# Patient Record
Sex: Male | Born: 1965
Health system: Southern US, Community
[De-identification: ages and names within clinical notes are randomized; demographics above are authoritative.]

## PROBLEM LIST (undated history)

## (undated) DIAGNOSIS — I10 Essential (primary) hypertension: Secondary | ICD-10-CM

## (undated) DIAGNOSIS — K8020 Calculus of gallbladder without cholecystitis without obstruction: Secondary | ICD-10-CM

## (undated) DIAGNOSIS — M1990 Unspecified osteoarthritis, unspecified site: Secondary | ICD-10-CM

## (undated) DIAGNOSIS — R51 Headache: Secondary | ICD-10-CM

## (undated) HISTORY — DX: Headache: R51

## (undated) HISTORY — PX: TOE FUSION: SHX1070

## (undated) HISTORY — PX: OTHER SURGICAL HISTORY: SHX169

## (undated) HISTORY — PX: TRIGGER FINGER RELEASE: SHX641

## (undated) HISTORY — PX: ROTATOR CUFF REPAIR: SHX139

---

## 2011-08-29 ENCOUNTER — Ambulatory Visit
Admission: RE | Admit: 2011-08-29 | Discharge: 2011-08-29 | Disposition: A | Discharge status: Home / Self Care | Payer: BC Managed Care – PPO | Source: Ambulatory Visit | Attending: Family Medicine | Admitting: Family Medicine

## 2011-08-29 ENCOUNTER — Other Ambulatory Visit: Payer: Self-pay | Admitting: Family Medicine

## 2011-08-29 DIAGNOSIS — R609 Edema, unspecified: Secondary | ICD-10-CM

## 2011-08-29 DIAGNOSIS — R52 Pain, unspecified: Secondary | ICD-10-CM

## 2011-08-29 IMAGING — US US EXTREM LOW VENOUS*R*
1 series · 14 of 24 positions shown · non-contrast
Comparison: None

CLINICAL DATA: right knee pain and swelling//question dvtt;;

RIGHT LOWER EXTREMITY VENOUS DUPLEX ULTRASOUND
TECHNIQUE: Gray-scale sonography with compression, as well as color
and duplex ultrasound, were performed to evaluate the deep venous
system from the level of the common femoral vein through the
popliteal and proximal calf veins.

[Series 1: us extrem low venous*right* · 14 of 34 slices shown]
[im 1/34]
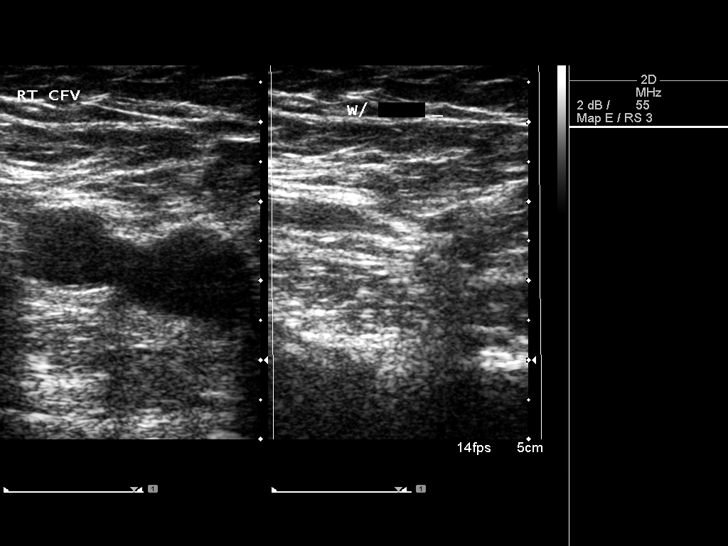
[im 3/34]
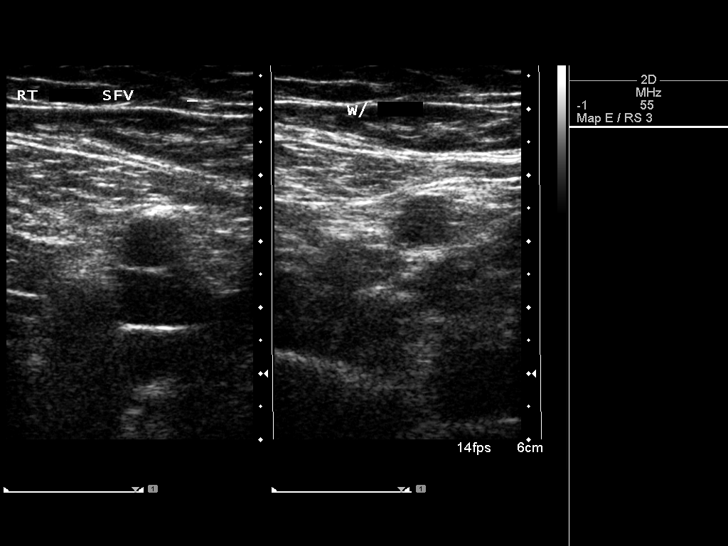
[im 6/34]
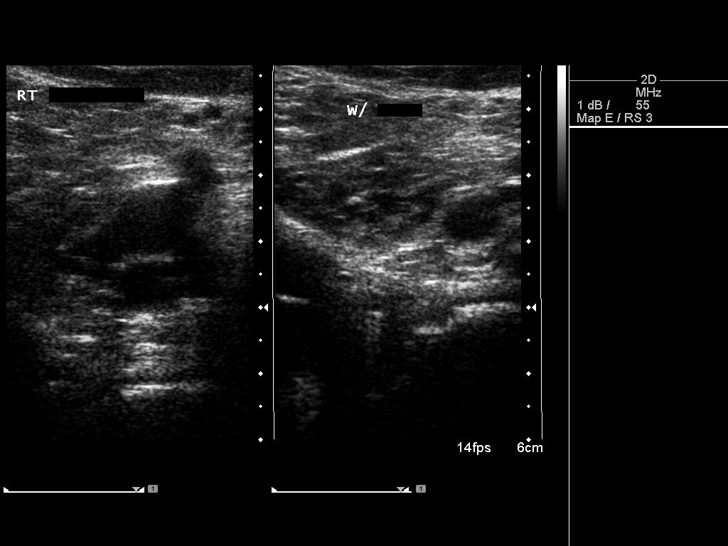
[im 9/34]
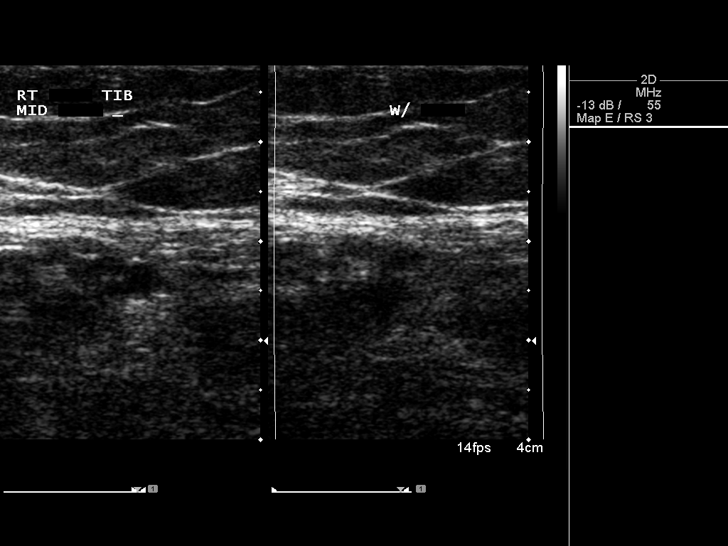
[im 11/34]
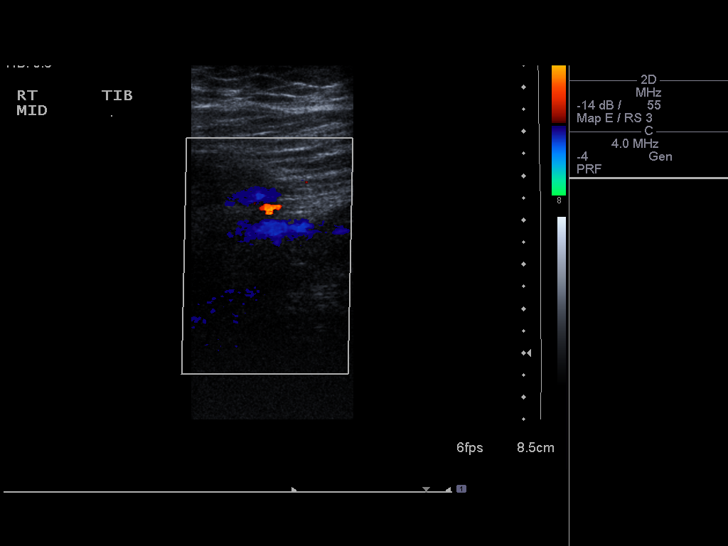
[im 13/34]
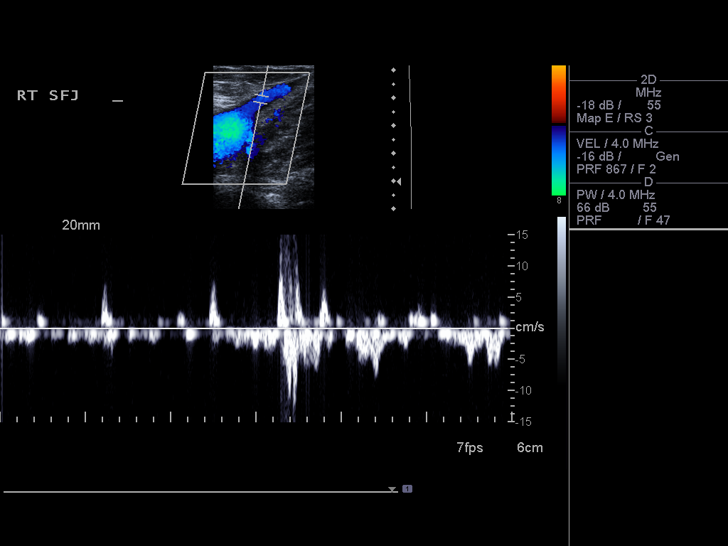
[im 16/34]
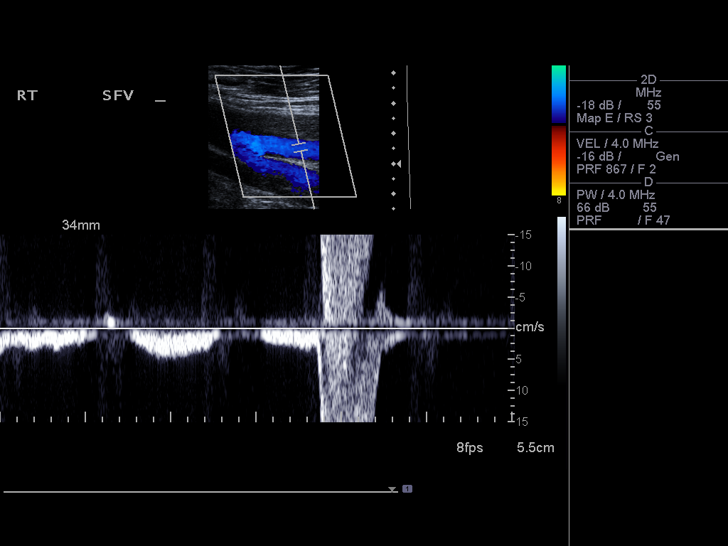
[im 18/34]
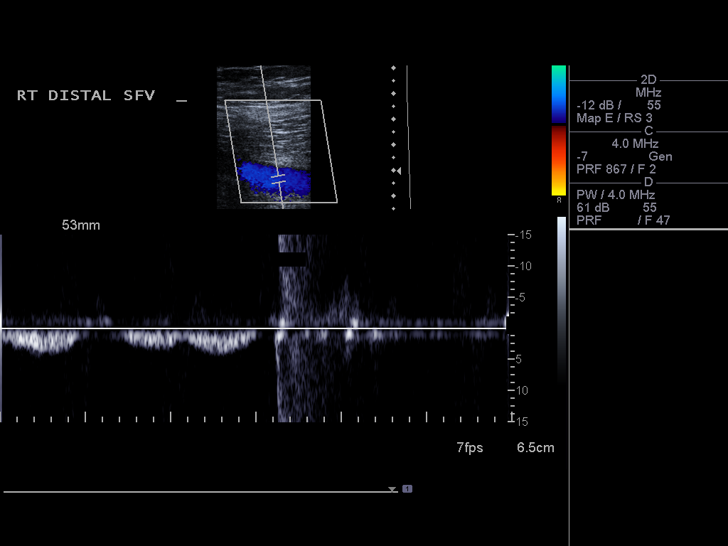
[im 21/34]
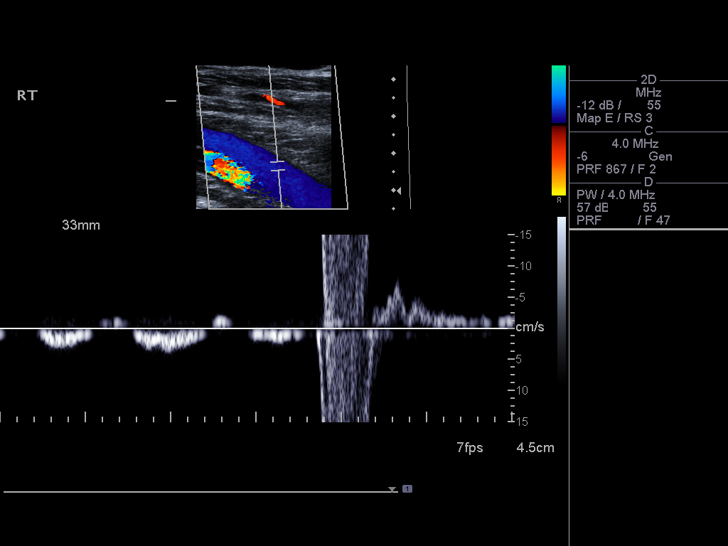
[im 23/34]
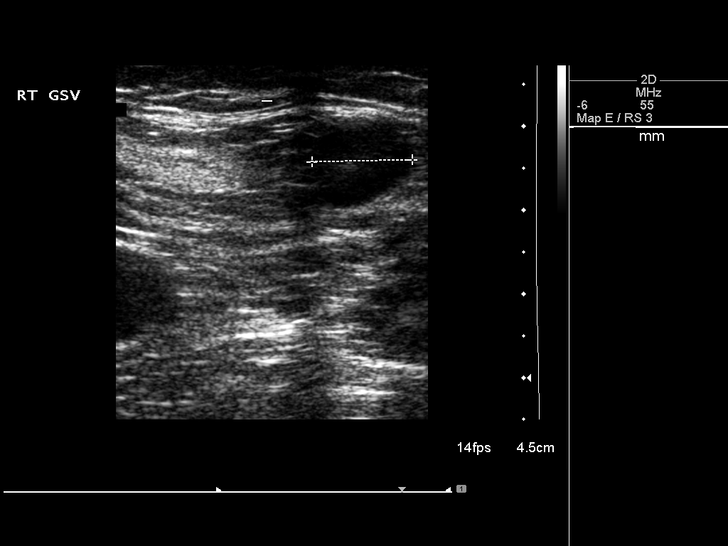
[im 26/34]
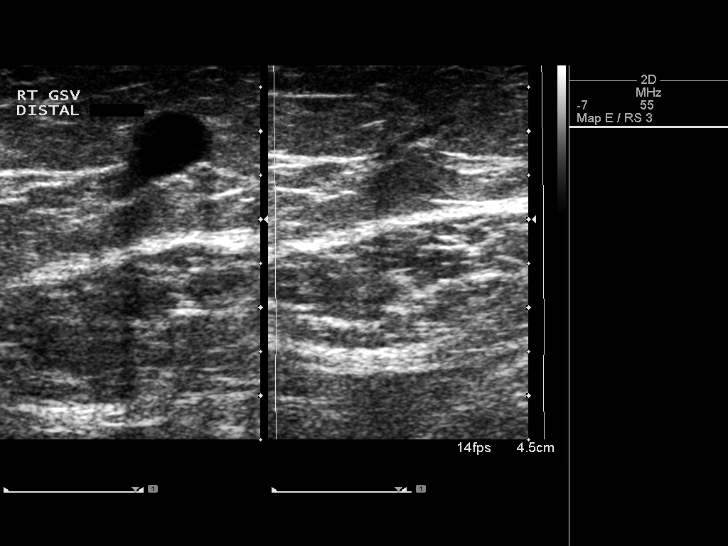
[im 28/34]
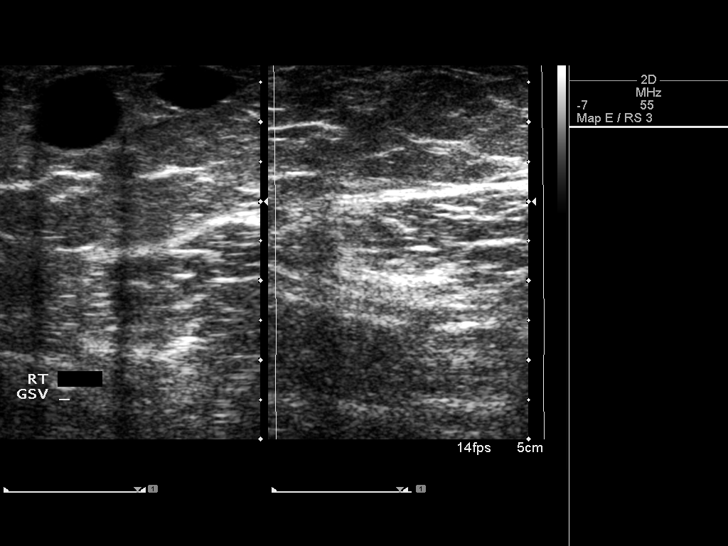
[im 31/34]
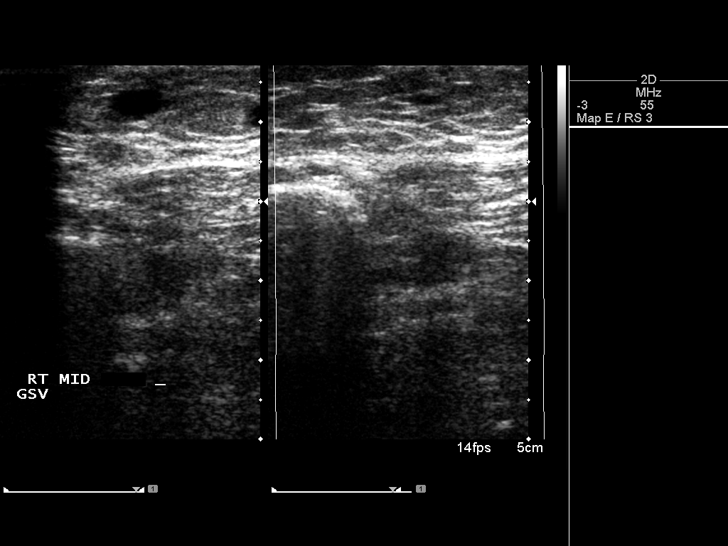
[im 34/34]
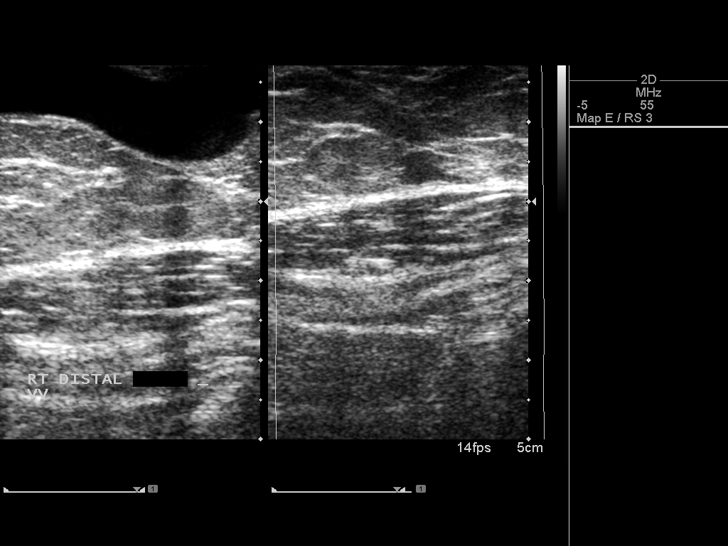

[14 of 24 positions shown; findings below may reference images not displayed]

FINDINGS: Normal compressibility and normal Doppler signal within
the common femoral, superficial femoral and popliteal veins, down
to the proximal calf veins.  No grayscale filling defects to
suggest DVT.

There are prominent varicosities in the lower thigh and upper calf.
These are compressible.
IMPRESSION: No evidence of right lower extremity deep vein thrombosis.

## 2011-12-02 ENCOUNTER — Emergency Department (HOSPITAL_COMMUNITY): Payer: BC Managed Care – PPO

## 2011-12-02 ENCOUNTER — Inpatient Hospital Stay (HOSPITAL_COMMUNITY)
Admission: EM | Admit: 2011-12-02 | Discharge: 2011-12-05 | DRG: 493 | Disposition: A | Discharge status: Home / Self Care | Payer: BC Managed Care – PPO | Attending: General Surgery | Admitting: General Surgery

## 2011-12-02 ENCOUNTER — Other Ambulatory Visit: Payer: Self-pay

## 2011-12-02 ENCOUNTER — Encounter (HOSPITAL_COMMUNITY): Payer: Self-pay | Admitting: *Deleted

## 2011-12-02 DIAGNOSIS — R112 Nausea with vomiting, unspecified: Secondary | ICD-10-CM | POA: Diagnosis present

## 2011-12-02 DIAGNOSIS — E876 Hypokalemia: Secondary | ICD-10-CM

## 2011-12-02 DIAGNOSIS — Z79899 Other long term (current) drug therapy: Secondary | ICD-10-CM

## 2011-12-02 DIAGNOSIS — I10 Essential (primary) hypertension: Secondary | ICD-10-CM

## 2011-12-02 DIAGNOSIS — R1011 Right upper quadrant pain: Secondary | ICD-10-CM

## 2011-12-02 DIAGNOSIS — E669 Obesity, unspecified: Secondary | ICD-10-CM

## 2011-12-02 DIAGNOSIS — K8000 Calculus of gallbladder with acute cholecystitis without obstruction: Principal | ICD-10-CM | POA: Diagnosis present

## 2011-12-02 DIAGNOSIS — R109 Unspecified abdominal pain: Secondary | ICD-10-CM

## 2011-12-02 DIAGNOSIS — Z6831 Body mass index (BMI) 31.0-31.9, adult: Secondary | ICD-10-CM

## 2011-12-02 HISTORY — DX: Essential (primary) hypertension: I10

## 2011-12-02 LAB — CBC
MCH: 32.1 pg (ref 26.0–34.0)
MCHC: 36.8 g/dL — ABNORMAL HIGH (ref 30.0–36.0)
Platelets: 236 10*3/uL (ref 150–400)

## 2011-12-02 LAB — LIPASE, BLOOD: Lipase: 27 U/L (ref 11–59)

## 2011-12-02 LAB — COMPREHENSIVE METABOLIC PANEL
ALT: 26 U/L (ref 0–53)
Albumin: 4.2 g/dL (ref 3.5–5.2)
Alkaline Phosphatase: 66 U/L (ref 39–117)
BUN: 11 mg/dL (ref 6–23)
Calcium: 9.7 mg/dL (ref 8.4–10.5)
Potassium: 3 mEq/L — ABNORMAL LOW (ref 3.5–5.1)
Sodium: 140 mEq/L (ref 135–145)
Total Protein: 7.6 g/dL (ref 6.0–8.3)

## 2011-12-02 LAB — URINALYSIS, ROUTINE W REFLEX MICROSCOPIC
Bilirubin Urine: NEGATIVE
Nitrite: NEGATIVE
Specific Gravity, Urine: 1.023 (ref 1.005–1.030)
Urobilinogen, UA: 1 mg/dL (ref 0.0–1.0)
pH: 7.5 (ref 5.0–8.0)

## 2011-12-02 LAB — URINE CULTURE: Culture  Setup Time: 201301150804

## 2011-12-02 LAB — DIFFERENTIAL
Basophils Relative: 0 % (ref 0–1)
Eosinophils Absolute: 0 10*3/uL (ref 0.0–0.7)
Eosinophils Relative: 0 % (ref 0–5)
Neutrophils Relative %: 84 % — ABNORMAL HIGH (ref 43–77)

## 2011-12-02 IMAGING — CR DG ABDOMEN ACUTE W/ 1V CHEST
3 series · 3 of 3 positions shown · non-contrast
Comparison: None.

CLINICAL DATA: Epigastric abdominal pain radiating to the back;
nausea and vomiting.

ACUTE ABDOMEN SERIES (ABDOMEN 2 VIEW & CHEST 1 VIEW)

[w chest pa]
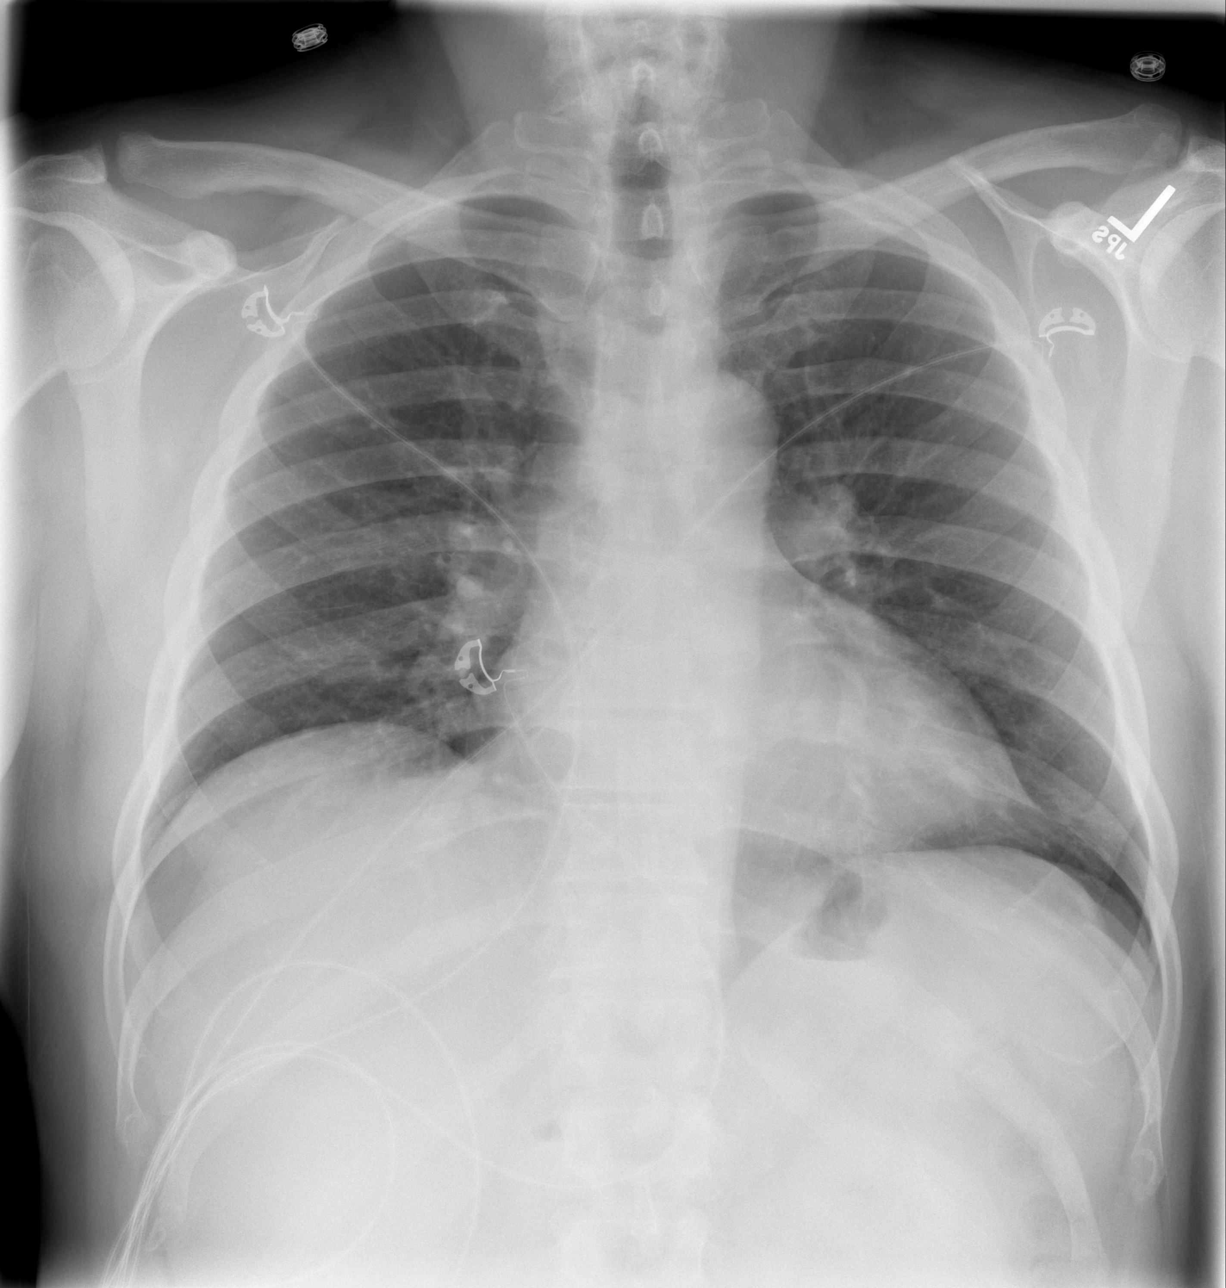

[w abdomen upright]
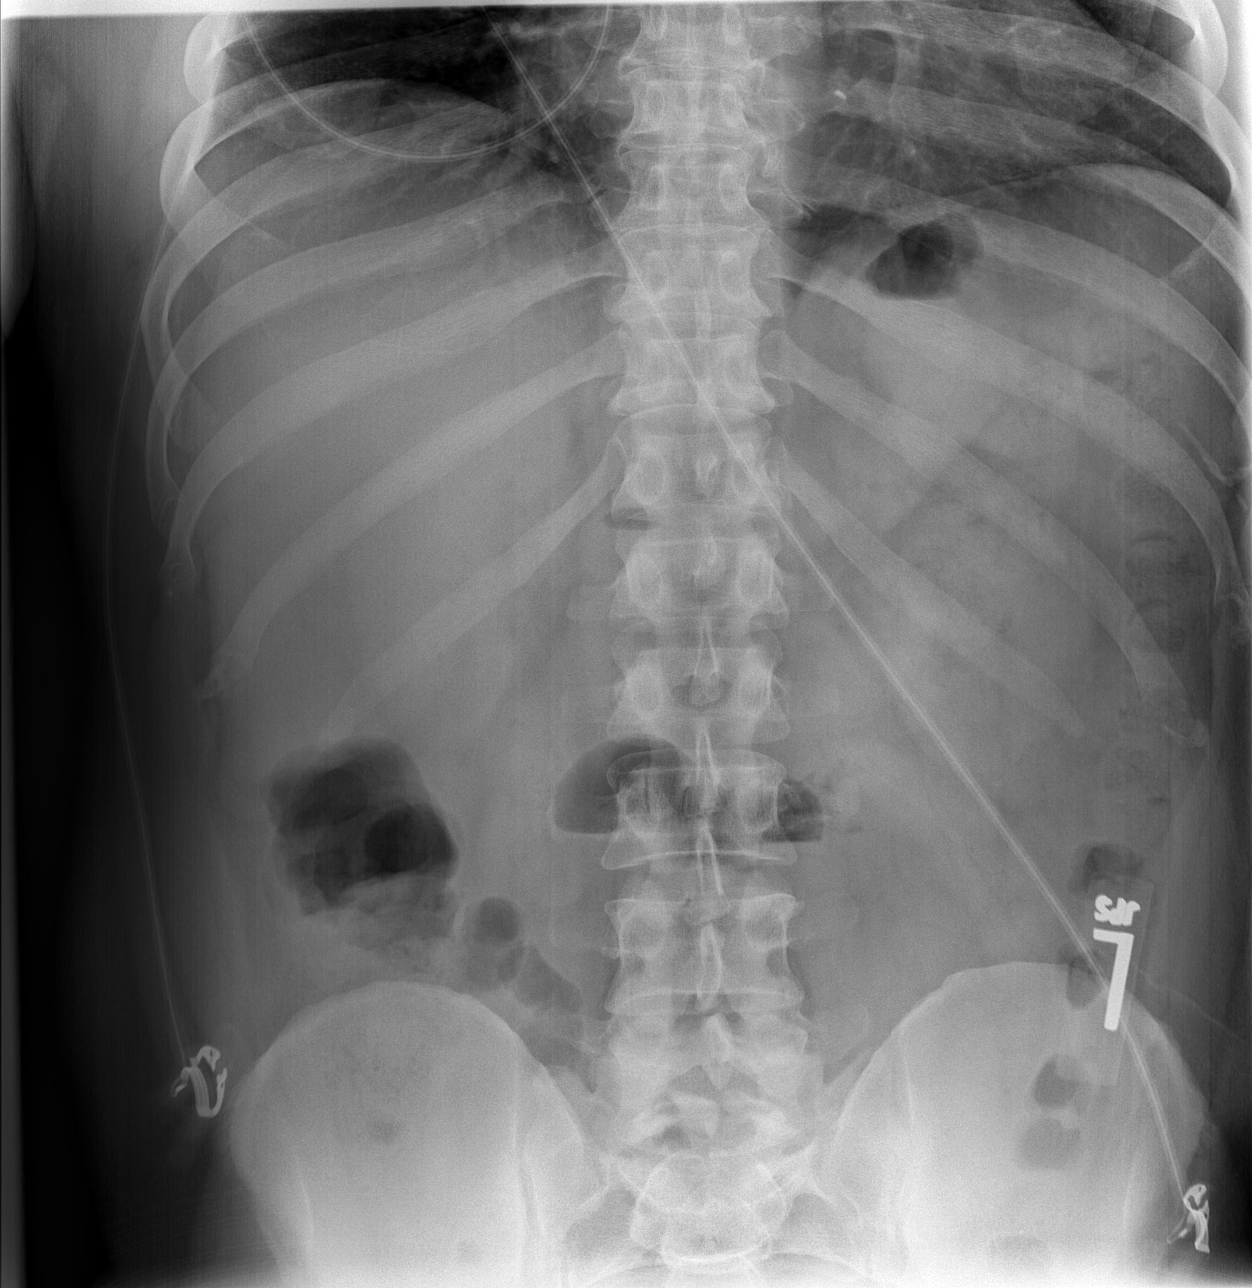

[t abdomen supine]
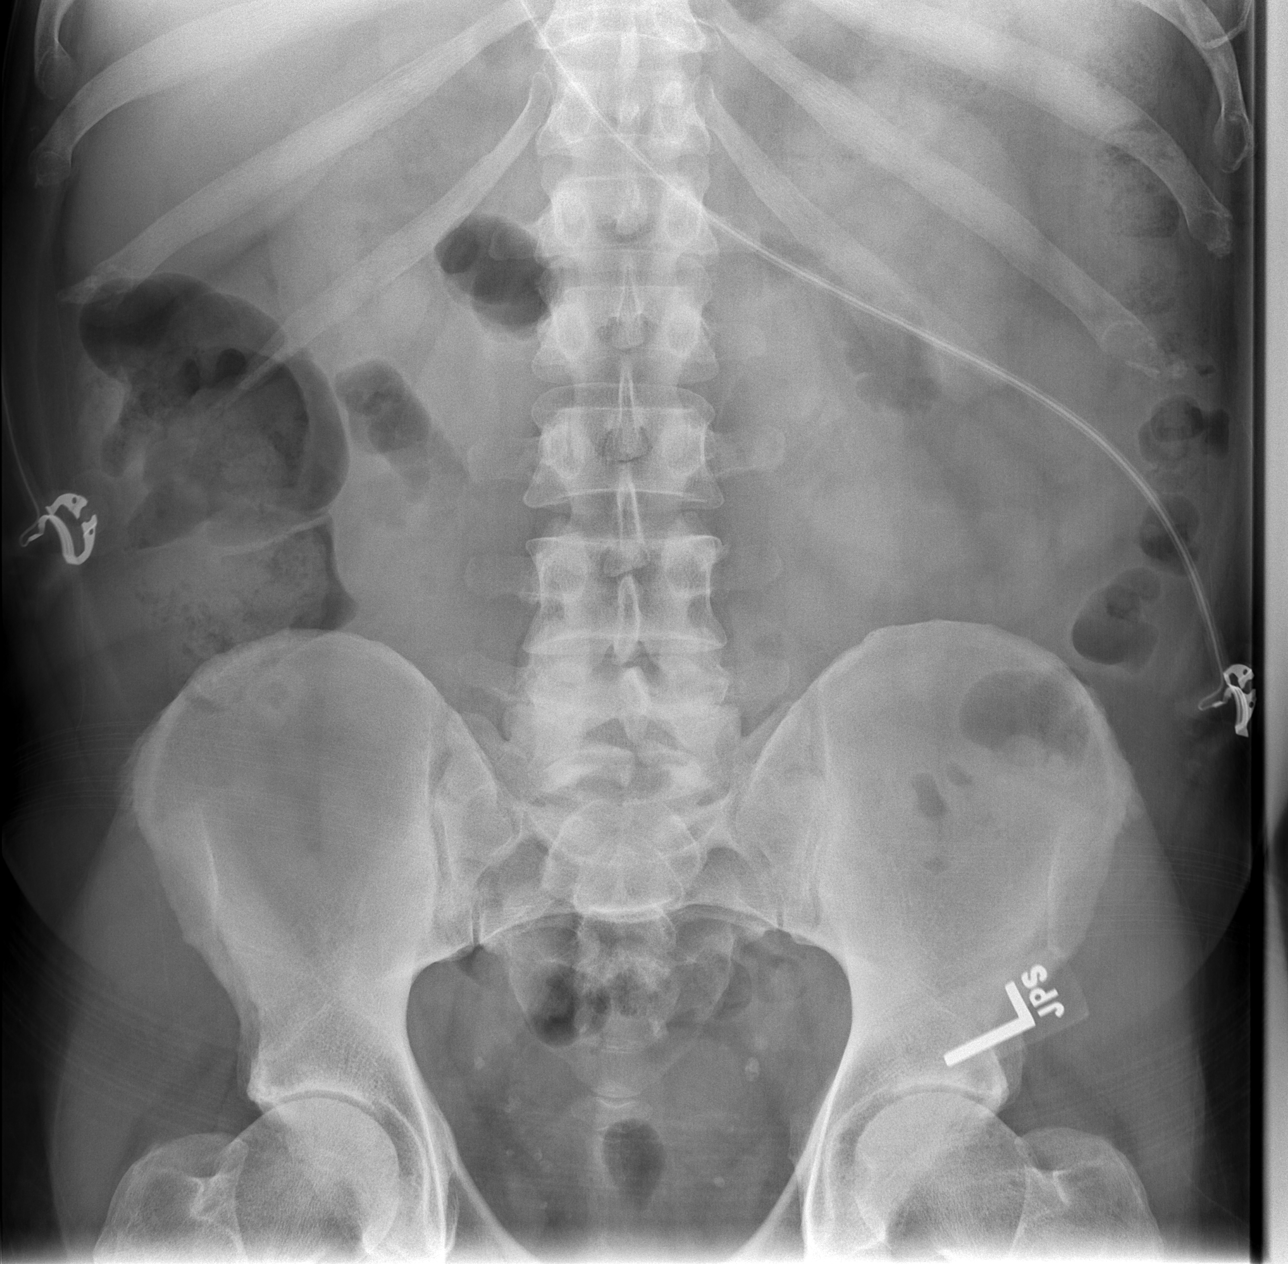

[3 of 3 positions shown; findings below may reference images not displayed]

FINDINGS: The lungs are well-aerated and clear.  There is no
evidence of focal opacification, pleural effusion or pneumothorax.
The cardiomediastinal silhouette is borderline normal in size.

The visualized bowel gas pattern is unremarkable.  Scattered stool
and air are seen within the colon; there is no evidence of small
bowel dilatation to suggest obstruction.  No free intra-abdominal
air is identified on the provided upright view.

No acute osseous abnormalities are seen; the sacroiliac joints are
unremarkable in appearance.
IMPRESSION: 1.  Unremarkable bowel gas pattern; no free intra-abdominal air
seen.
2.  No acute cardiopulmonary process identified.

## 2011-12-02 IMAGING — CT CT ABD-PELV W/ CM
2 of 5 series · 14 of 32 positions shown, 19 images · IV contrast (water/omni  & 100ml omni 300)
Comparison: Radiographs dated [DATE]

CLINICAL DATA: Epigastric pain with nausea and vomiting.  Elevated
white blood count.

CT ABDOMEN AND PELVIS WITH CONTRAST
TECHNIQUE: Multidetector CT imaging of the abdomen and pelvis was
performed following the standard protocol during bolus
administration of intravenous contrast.
Contrast: 100mL OMNIPAQUE IOHEXOL 300 MG/ML IV SOLN

[Series 2: routine abdomen · axial · 0.83mm/px · z∈[-362,-36]mm · 6 of 93 slices shown, 11 images]
[im 14/93  soft-tissue]
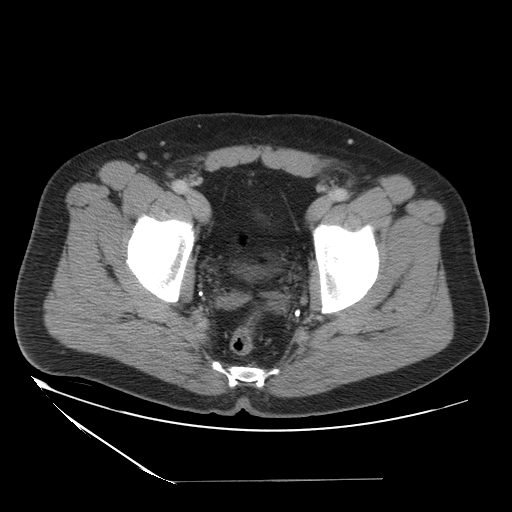
[im 14/93  bone]
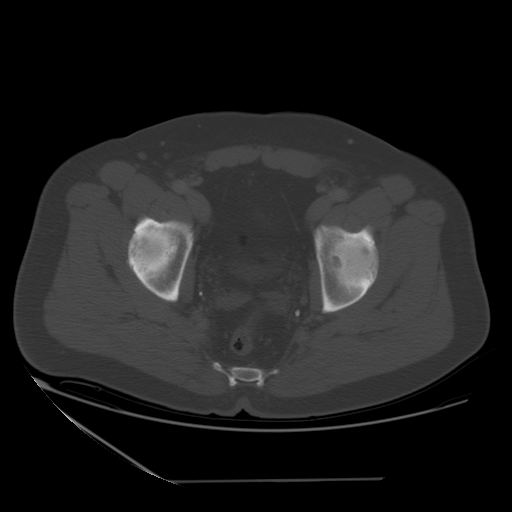
[im 27/93  soft-tissue]
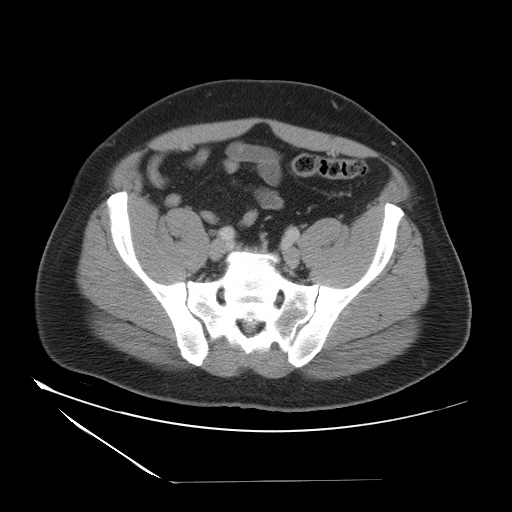
[im 40/93  soft-tissue]
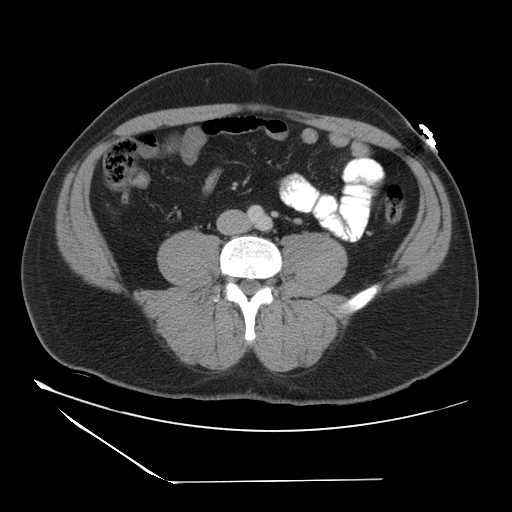
[im 40/93  lung]
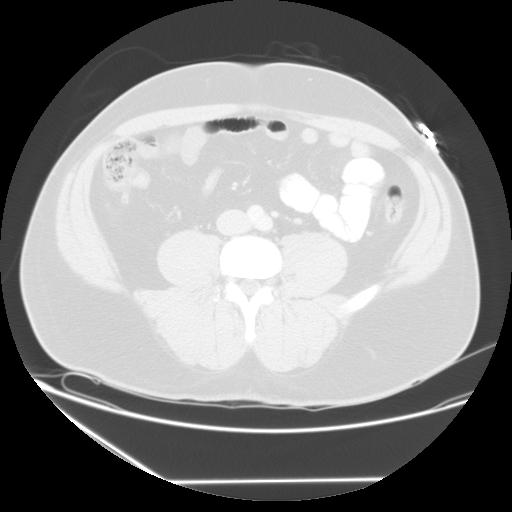
[im 53/93  soft-tissue]
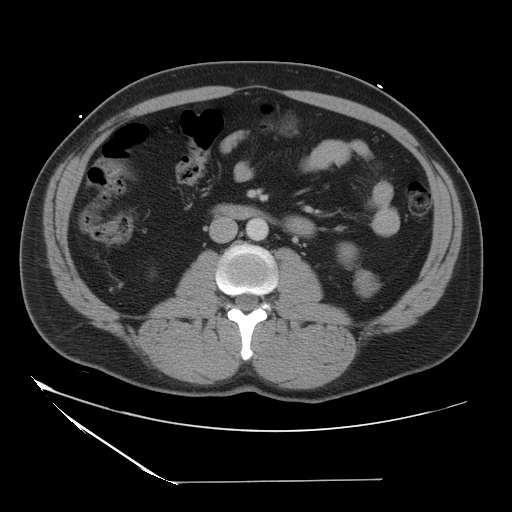
[im 53/93  lung]
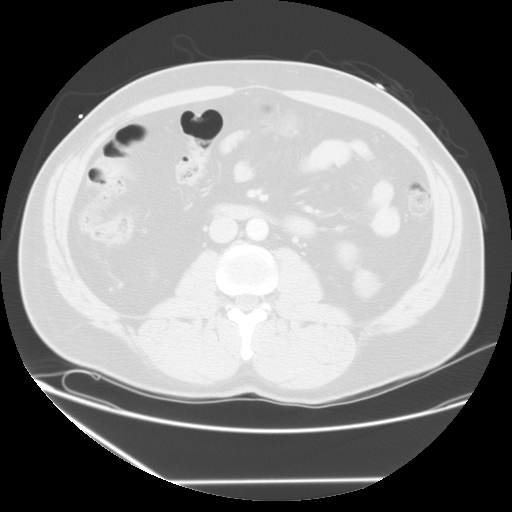
[im 66/93  soft-tissue]
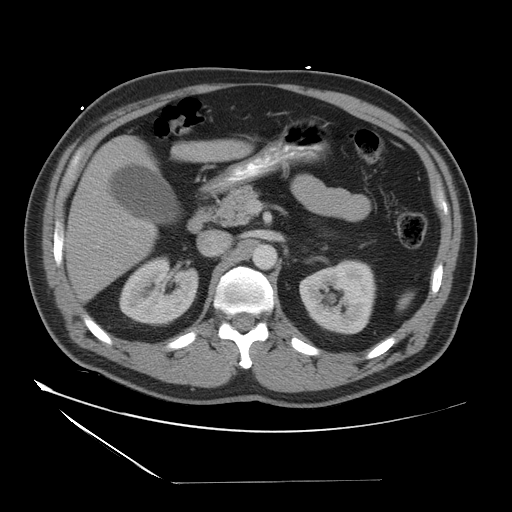
[im 66/93  lung]
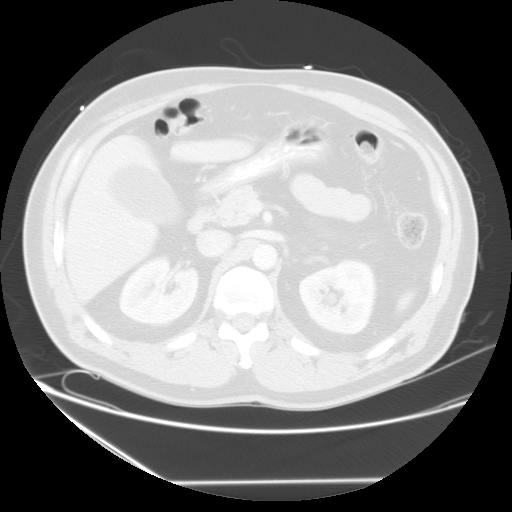
[im 79/93  soft-tissue]
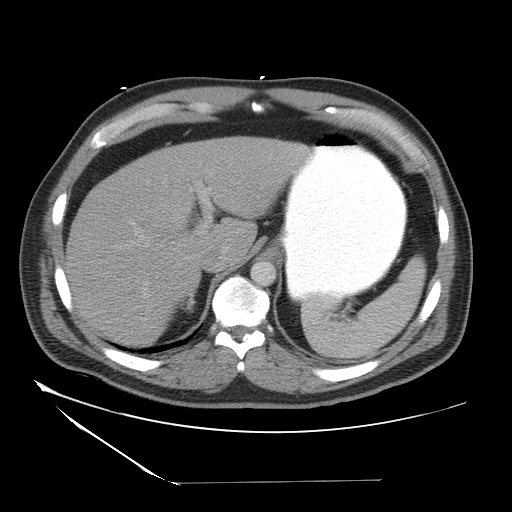
[im 79/93  lung]
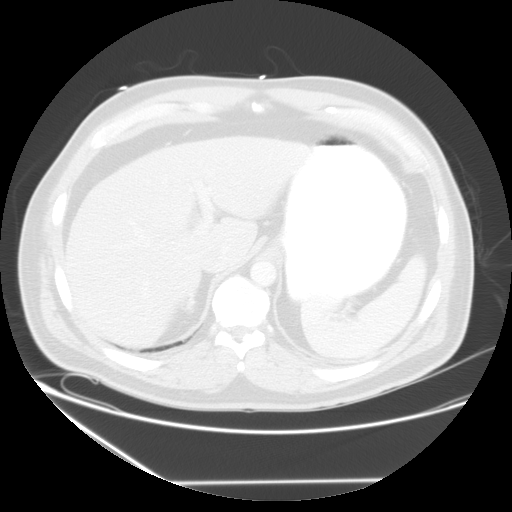

[Series 401: sag · sagittal · 1.00mm/px · 8 of 121 slices shown]
[im 14/121  soft-tissue]
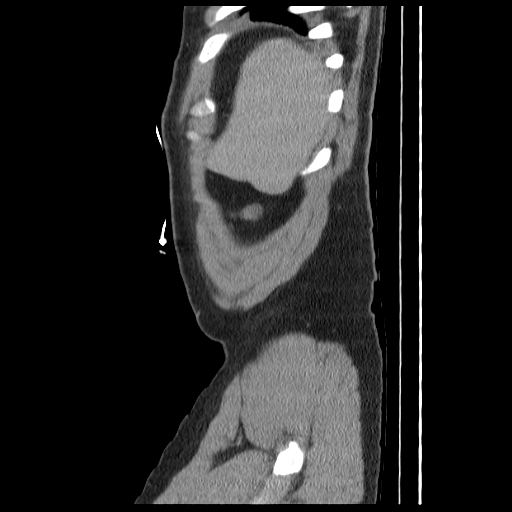
[im 27/121  soft-tissue]
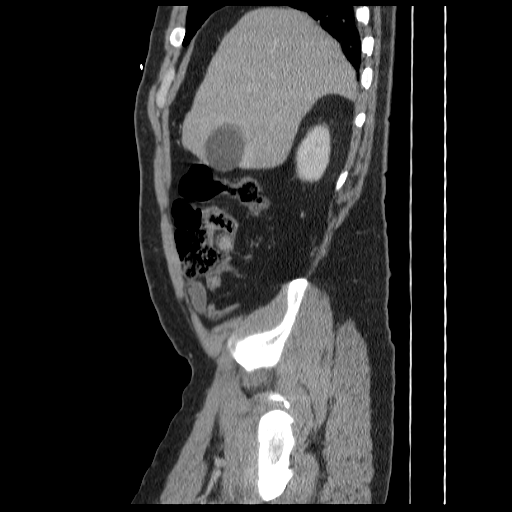
[im 41/121  soft-tissue]
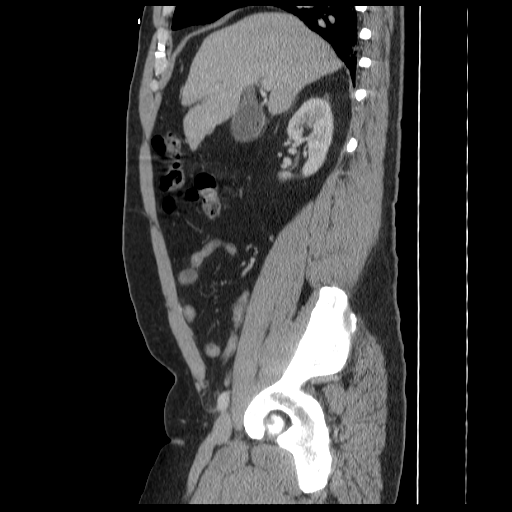
[im 54/121  soft-tissue]
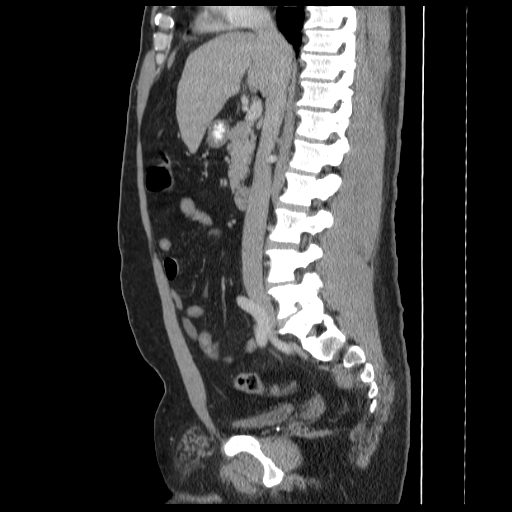
[im 67/121  soft-tissue]
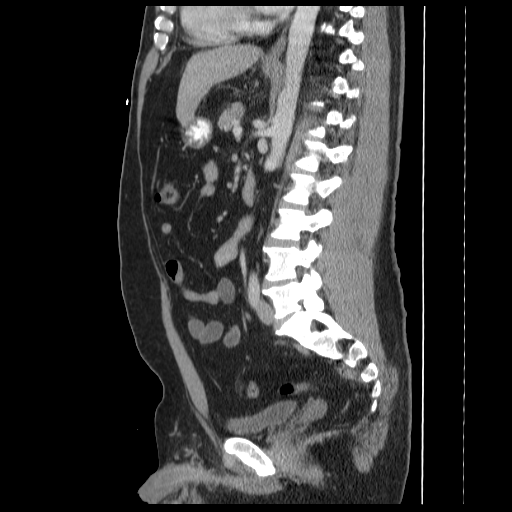
[im 81/121  soft-tissue]
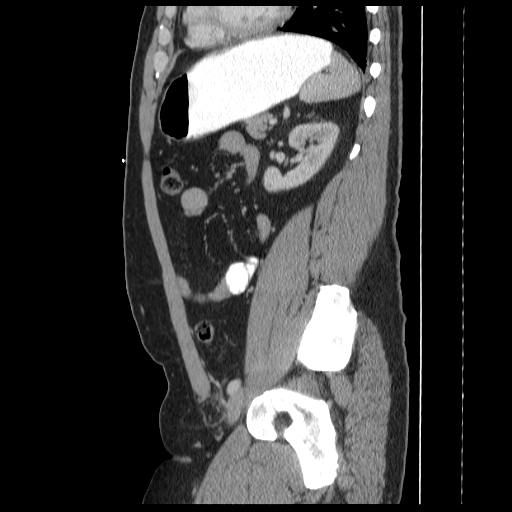
[im 94/121  soft-tissue]
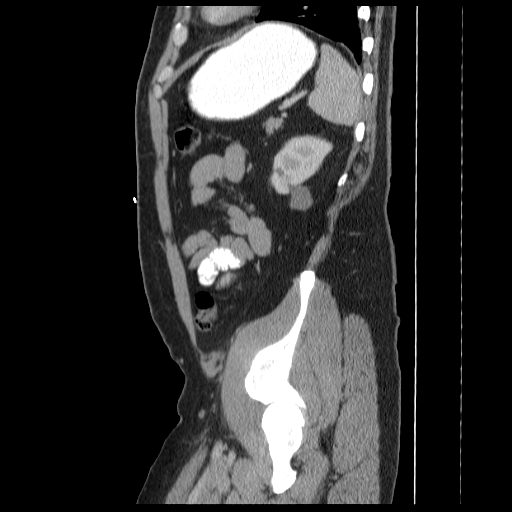
[im 107/121  soft-tissue]
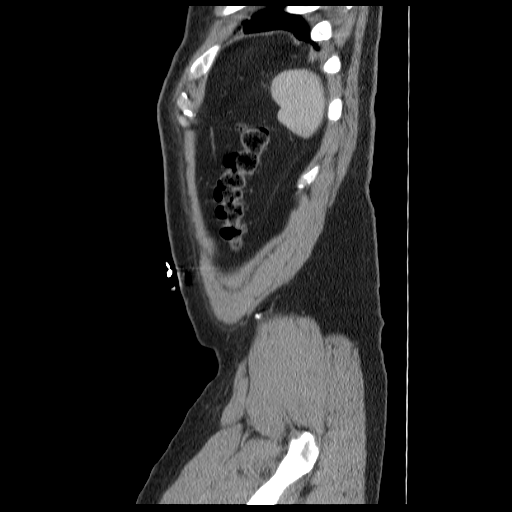

[14 of 32 positions shown; findings below may reference images not displayed]

FINDINGS: There are several cholesterol stones in the slightly
prominent gallbladder.  Gallbladder wall is not thickened.  No
dilated bile ducts.

Liver, spleen, pancreas, adrenal glands, and abdominal aorta are
normal.  Multiple small benign appearing cysts on the left kidney
the largest being exophytic and measuring 2.4 cm in diameter in the
lower pole.  Single tiny cyst in the medial aspect of the upper
pole of the right kidney.

The bowel appears normal including the terminal ileum and appendix.
No adenopathy. No acute osseous abnormality.  Mild arthritis of the
hips.
IMPRESSION: Multiple cholesterol gallstones with slight distention of the
gallbladder.  Otherwise benign-appearing abdomen and pelvis.

## 2011-12-02 MED ORDER — IOHEXOL 300 MG/ML  SOLN: 20.0000 mL | INTRAMUSCULAR | Status: AC

## 2011-12-02 MED ORDER — ONDANSETRON HCL 4 MG/2ML IJ SOLN
4.0000 mg | Freq: Four times a day (QID) | INTRAMUSCULAR | Status: DC | PRN
  Administered 2011-12-02: 4 mg via INTRAVENOUS
  Filled 2011-12-02: qty 2

## 2011-12-02 MED ORDER — SODIUM CHLORIDE 0.9 % IV SOLN
INTRAVENOUS | Status: DC
  Administered 2011-12-02: 125 mL/h via INTRAVENOUS
  Administered 2011-12-03: 16:00:00 via INTRAVENOUS

## 2011-12-02 MED ORDER — PIPERACILLIN-TAZOBACTAM 3.375 G IVPB 30 MIN: 3.3750 g | INTRAVENOUS | Status: DC

## 2011-12-02 MED ORDER — HYDROMORPHONE HCL PF 1 MG/ML IJ SOLN
INTRAMUSCULAR | Status: AC
  Administered 2011-12-02: 1 mg via INTRAVENOUS
  Filled 2011-12-02: qty 1

## 2011-12-02 MED ORDER — POTASSIUM CHLORIDE 10 MEQ/100ML IV SOLN
10.0000 meq | INTRAVENOUS | Status: AC
  Administered 2011-12-02 (×3): 10 meq via INTRAVENOUS
  Filled 2011-12-02 (×4): qty 100

## 2011-12-02 MED ORDER — HYDROMORPHONE HCL PF 1 MG/ML IJ SOLN
1.0000 mg | Freq: Once | INTRAMUSCULAR | Status: AC
  Administered 2011-12-02: 1 mg via INTRAVENOUS
  Filled 2011-12-02: qty 1

## 2011-12-02 MED ORDER — SODIUM CHLORIDE 0.9 % IV BOLUS (SEPSIS)
1000.0000 mL | Freq: Once | INTRAVENOUS | Status: AC
  Administered 2011-12-02: 1000 mL via INTRAVENOUS

## 2011-12-02 MED ORDER — HYDROCODONE-ACETAMINOPHEN 5-325 MG PO TABS
1.0000 | ORAL_TABLET | ORAL | Status: DC | PRN
  Administered 2011-12-02 (×2): 2 via ORAL
  Administered 2011-12-03: 1 via ORAL
  Filled 2011-12-02 (×3): qty 2

## 2011-12-02 MED ORDER — HYDROMORPHONE HCL PF 1 MG/ML IJ SOLN
INTRAMUSCULAR | Status: AC
  Administered 2011-12-02: 1 mg
  Filled 2011-12-02: qty 1

## 2011-12-02 MED ORDER — ONDANSETRON HCL 4 MG/2ML IJ SOLN
4.0000 mg | Freq: Once | INTRAMUSCULAR | Status: AC
  Administered 2011-12-02: 4 mg via INTRAVENOUS
  Filled 2011-12-02: qty 2

## 2011-12-02 MED ORDER — HYDROMORPHONE HCL 2 MG PO TABS: 1.0000 mg | ORAL_TABLET | Freq: Once | ORAL | Status: DC

## 2011-12-02 MED ORDER — PANTOPRAZOLE SODIUM 40 MG IV SOLR
40.0000 mg | Freq: Every day | INTRAVENOUS | Status: DC
  Administered 2011-12-02: 40 mg via INTRAVENOUS
  Filled 2011-12-02 (×2): qty 40

## 2011-12-02 MED ORDER — KCL IN DEXTROSE-NACL 40-5-0.45 MEQ/L-%-% IV SOLN
INTRAVENOUS | Status: AC
  Administered 2011-12-02: 1000 mL via INTRAVENOUS
  Filled 2011-12-02: qty 1000

## 2011-12-02 MED ORDER — PIPERACILLIN-TAZOBACTAM 3.375 G IVPB
3.3750 g | Freq: Three times a day (TID) | INTRAVENOUS | Status: DC
  Administered 2011-12-02 – 2011-12-03 (×2): 3.375 g via INTRAVENOUS
  Filled 2011-12-02 (×5): qty 50

## 2011-12-02 MED ORDER — HYDROMORPHONE HCL PF 2 MG/ML IJ SOLN
INTRAMUSCULAR | Status: AC
  Administered 2011-12-02: 12:00:00
  Filled 2011-12-02: qty 1

## 2011-12-02 MED ORDER — HYDROMORPHONE HCL PF 1 MG/ML IJ SOLN: 0.5000 mg | INTRAMUSCULAR | Status: DC | PRN

## 2011-12-02 MED ORDER — KCL IN DEXTROSE-NACL 40-5-0.45 MEQ/L-%-% IV SOLN
INTRAVENOUS | Status: DC
  Administered 2011-12-02: 1000 mL via INTRAVENOUS
  Administered 2011-12-03: 04:00:00 via INTRAVENOUS
  Filled 2011-12-02 (×7): qty 1000

## 2011-12-02 MED ORDER — PIPERACILLIN-TAZOBACTAM 3.375 G IVPB 30 MIN
3.3750 g | INTRAVENOUS | Status: AC
  Administered 2011-12-02 – 2011-12-03 (×2): 3.375 g via INTRAVENOUS
  Filled 2011-12-02: qty 50

## 2011-12-02 MED ORDER — IOHEXOL 300 MG/ML  SOLN
100.0000 mL | Freq: Once | INTRAMUSCULAR | Status: AC | PRN
  Administered 2011-12-02: 100 mL via INTRAVENOUS

## 2011-12-02 MED ORDER — HYDROMORPHONE HCL PF 1 MG/ML IJ SOLN
1.0000 mg | Freq: Once | INTRAMUSCULAR | Status: AC
Start: 2011-12-02 — End: 2011-12-02
  Administered 2011-12-02 (×2): 1 mg via INTRAVENOUS
  Filled 2011-12-02: qty 1

## 2011-12-02 MED ORDER — HEPARIN SODIUM (PORCINE) 5000 UNIT/ML IJ SOLN
5000.0000 [IU] | Freq: Three times a day (TID) | INTRAMUSCULAR | Status: DC
  Administered 2011-12-02 (×2): 5000 [IU] via SUBCUTANEOUS
  Filled 2011-12-02 (×6): qty 1

## 2011-12-02 NOTE — H&P (Signed)
Reginald [name] is an 46 y.o. male.   Chief Complaint: Abdominal Pain Primary Care: 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 He reports being normal state of health yesterday. He had dinner around 6 PM and did well until about 11 PM and Acute onset of pain. Reports pain and his midepigastric area just below the xiphoid. He also has severe pain in his right upper quadrant. This has waxed and waned but never completely resolved since onset at 44 PM yesterday. Onset of pain he had trouble with nausea and vomiting. He has not vomited since last night, before he came to the ER. He continues to have pain 4/10, even after IV Dilaudid. Workup in the ER shows a white count of 12,800 hemoglobin of 15.6 hematocrit of 42.4 platelets 236,000 potassium was 3.0 with remaining electrolytes normal. Total bilirubin 0.45m, alkaline phosphatase 66, AST 23, ALT 26, lipase 27. Acute abdomen was unremarkable. CT scan shows several stones in the gallbladder the gallbladder was not thickened no bile duct dilatation. The gallbladder shows mild distension. After multiple injections of Dilaudid, he continues to have pain and tenderness in the right upper quadrant.  Past Medical History  Diagnosis Date  . Hypertension    BMI of 31.6  History reviewed. No pertinent past surgical history. Carpal tunnel release, spider bite debridement, surgery on small toe left foot all as a child. History reviewed. No pertinent family history. Social History:  reports that he has never smoked. He does not have any smokeless tobacco history on file. He reports that he drinks alcohol. His drug history not on file. EtOH: Negative  Tobacco: Never. Drugs: He reports, multiple substance use in his youth. None for 20 years.     Family Hx: Father is living and in good health. Mother is deceased at 60 she had a heart attack she was a smoker with multiple medical problems. One half sister in good health, no  brothers. Allergies:  Allergies  Allergen Reactions  . Ibuprofen Nausea And Vomiting    High doses make him vomit.   Home Meds:  HCTZ 12.5MG  DAILY  Medications Prior to Admission  Medication Dose Route Frequency Provider Last Rate Last Dose  . 0.9 %  sodium chloride infusion   Intravenous Continuous Flint Melter, MD 125 mL/hr at 12/02/11 0726 125 mL/hr at 12/02/11 0726  . HYDROmorphone (DILAUDID) 2 MG/ML injection           . HYDROmorphone (DILAUDID) injection 1 mg  1 mg Intravenous Once Flint Melter, MD   1 mg at 12/02/11 0530  . HYDROmorphone (DILAUDID) injection 1 mg  1 mg Intravenous Once Hilario Quarry, MD   1 mg at 12/02/11 0749  . HYDROmorphone (DILAUDID) injection 1 mg  1 mg Intravenous Once Hilario Quarry, MD   1 mg at 12/02/11 1147  . iohexol (OMNIPAQUE) 300 MG/ML solution 100 mL  100 mL Intravenous Once PRN Medication Radiologist   100 mL at 12/02/11 0949  . iohexol (OMNIPAQUE) 300 MG/ML solution 20 mL  20 mL Oral Q1 Hr x 2 Hilario Quarry, MD      . ondansetron [facility]) injection 4 mg  4 mg Intravenous Once Flint Melter, MD   4 mg at 12/02/11 0530  . potassium chloride 10 mEq in 100 mL IVPB  10 mEq Intravenous Q1 Hr x 4 Flint Melter, MD   10 mEq at 12/02/11 1022  . sodium chloride 0.9 % bolus  1,000 mL  1,000 mL Intravenous Once Flint Melter, MD   1,000 mL at 12/02/11 0529  . DISCONTD: HYDROmorphone (DILAUDID) tablet 1 mg  1 mg Oral Once Hilario Quarry, MD       No current outpatient prescriptions on file as of 12/02/2011.    Results for orders placed during the hospital encounter of 12/02/11 (from the past 48 hour(s))  CBC     Status: Abnormal   Collection Time   12/02/11  5:04 AM      Component Value Range Comment   WBC 12.8 (*) 4.0 - 10.5 (K/uL)    RBC 4.86  4.22 - 5.81 (MIL/uL)    Hemoglobin 15.6  13.0 - 17.0 (g/dL)    HCT 96.0  45.4 - 09.8 (%)    MCV 87.2  78.0 - 100.0 (fL)    MCH 32.1  26.0 - 34.0 (pg)    MCHC 36.8 (*) 30.0 - 36.0 (g/dL)    RDW 11.9   14.7 - 82.9 (%)    Platelets 236  150 - 400 (K/uL)   DIFFERENTIAL     Status: Abnormal   Collection Time   12/02/11  5:04 AM      Component Value Range Comment   Neutrophils Relative 84 (*) 43 - 77 (%)    Neutro Abs 10.8 (*) 1.7 - 7.7 (K/uL)    Lymphocytes Relative 11 (*) 12 - 46 (%)    Lymphs Abs 1.4  0.7 - 4.0 (K/uL)    Monocytes Relative 4  3 - 12 (%)    Monocytes Absolute 0.6  0.1 - 1.0 (K/uL)    Eosinophils Relative 0  0 - 5 (%)    Eosinophils Absolute 0.0  0.0 - 0.7 (K/uL)    Basophils Relative 0  0 - 1 (%)    Basophils Absolute 0.0  0.0 - 0.1 (K/uL)   COMPREHENSIVE METABOLIC PANEL     Status: Abnormal   Collection Time   12/02/11  5:04 AM      Component Value Range Comment   Sodium 140  135 - 145 (mEq/L)    Potassium 3.0 (*) 3.5 - 5.1 (mEq/L)    Chloride 99  96 - 112 (mEq/L)    CO2 31  19 - 32 (mEq/L)    Glucose, Bld 129 (*) 70 - 99 (mg/dL)    BUN 11  6 - 23 (mg/dL)    Creatinine, Ser 5.62  0.50 - 1.35 (mg/dL)    Calcium 9.7  8.4 - 10.5 (mg/dL)    Total Protein 7.6  6.0 - 8.3 (g/dL)    Albumin 4.2  3.5 - 5.2 (g/dL)    AST 23  0 - 37 (U/L)    ALT 26  0 - 53 (U/L)    Alkaline Phosphatase 66  39 - 117 (U/L)    Total Bilirubin 0.7  0.3 - 1.2 (mg/dL)    GFR calc non Af Amer 83 (*) >90 (mL/min)    GFR calc Af Amer >90  >90 (mL/min)   LIPASE, BLOOD     Status: Normal   Collection Time   12/02/11  5:04 AM      Component Value Range Comment   Lipase 27  11 - 59 (U/L)   URINALYSIS, ROUTINE W REFLEX MICROSCOPIC     Status: Abnormal   Collection Time   12/02/11  7:27 AM      Component Value Range Comment   Color, Urine YELLOW  YELLOW  APPearance CLEAR  CLEAR     Specific Gravity, Urine 1.023  1.005 - 1.030     pH 7.5  5.0 - 8.0     Glucose, UA NEGATIVE  NEGATIVE (mg/dL)    Hgb urine dipstick NEGATIVE  NEGATIVE     Bilirubin Urine NEGATIVE  NEGATIVE     Ketones, ur 15 (*) NEGATIVE (mg/dL)    Protein, ur NEGATIVE  NEGATIVE (mg/dL)    Urobilinogen, UA 1.0  0.0 - 1.0  (mg/dL)    Nitrite NEGATIVE  NEGATIVE     Leukocytes, UA NEGATIVE  NEGATIVE  MICROSCOPIC NOT DONE ON URINES WITH NEGATIVE PROTEIN, BLOOD, LEUKOCYTES, NITRITE, OR GLUCOSE <1000 mg/dL.   Ct Abdomen Pelvis W Contrast  12/02/2011  *RADIOLOGY REPORT*  Clinical Data: Epigastric pain with nausea and vomiting.  Elevated white blood count.  CT ABDOMEN AND PELVIS WITH CONTRAST  Technique:  Multidetector CT imaging of the abdomen and pelvis was performed following the standard protocol during bolus administration of intravenous contrast.  Contrast: OMNIPAQUE IOHEXOL 300 MG/ML IV SOLN  Comparison: Radiographs dated 12/02/2011  Findings: There are several cholesterol stones in the slightly prominent gallbladder.  Gallbladder wall is not thickened.  No dilated bile ducts.  Liver, spleen, pancreas, adrenal glands, and abdominal aorta are normal.  Multiple small benign appearing cysts on the left kidney the largest being exophytic and measuring 2.4 cm in diameter in the lower pole.  Single tiny cyst in the medial aspect of the upper pole of the right kidney.  The bowel appears normal including the terminal ileum and appendix. No adenopathy. No acute osseous abnormality.  Mild arthritis of the hips.  IMPRESSION: Multiple cholesterol gallstones with slight distention of the gallbladder.  Otherwise benign-appearing abdomen and pelvis.  Original Report Authenticated By: Gwynn Burly, M.D.   Dg Abd Acute W/chest  12/02/2011  *RADIOLOGY REPORT*  Clinical Data: Epigastric abdominal pain radiating to the back; nausea and vomiting.  ACUTE ABDOMEN SERIES (ABDOMEN 2 VIEW & CHEST 1 VIEW)  Comparison: None.  Findings: The lungs are well-aerated and clear.  There is no evidence of focal opacification, pleural effusion or pneumothorax. The cardiomediastinal silhouette is borderline normal in size.  The visualized bowel gas pattern is unremarkable.  Scattered stool and air are seen within the colon; there is no evidence of small  bowel dilatation to suggest obstruction.  No free intra-abdominal air is identified on the provided upright view.  No acute osseous abnormalities are seen; the sacroiliac joints are unremarkable in appearance.  IMPRESSION:  1.  Unremarkable bowel gas pattern; no free intra-abdominal air seen. 2.  No acute cardiopulmonary process identified.  Original Report Authenticated By: Tonia Ghent, M.D.    Review of Systems  Constitutional: Positive for weight loss. Negative for fever, chills and diaphoresis. 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  HENT: Negative.   Eyes: Negative.   Respiratory: Positive for shortness of breath (He had an episode of SOB, about 4-5 years ago, got a stress test, which as far as he understands was negative, but some part was somewhat abnormal.. He doesn't understand what he was told.). Negative for cough, hemoptysis, sputum production and wheezing.   Cardiovascular: Negative.   Gastrointestinal: Positive for nausea, vomiting (last pm with onset of sx.), abdominal pain (mid epigastric below xyphoid, and mostly RUQ.) and diarrhea (some yesterday before onset of pain.). Negative for heartburn, constipation, blood in stool and melena.  Genitourinary: Negative.   Musculoskeletal: Negative.   Skin: Negative.   Neurological: Negative.  Negative for weakness.  Endo/Heme/Allergies: Negative.   Psychiatric/Behavioral: Positive for substance abuse (remote history drug use, but none for 20 years.). Negative for hallucinations and memory loss. The patient is nervous/anxious (better since he started hypertensive treatment.). The patient does not have insomnia.     Blood pressure 150/94, pulse 58, temperature 97.9 F (36.6 C), temperature source Oral, resp. rate 18, SpO2 96.00%. Physical Exam   Assessment/Plan 1. Cholelithiasis with ongoing right upper quadrant, mild gallbladder distention. 2. Hypertension 3.  BMI of 31.6 4. Hypokalemia  Plan: Admit the patient, hydrate him today, and replace potassium. Start antibiotics, pain control, and probable cholecystectomy tomorrow. Dr. Andrey Campanile will see and review. Risk and benefits will be discussed. Will [facility] physician assistant for Dr. Gaynelle Adu.  Natalyah Cummiskey 12/02/2011, 12:35 PM

## 2011-12-02 NOTE — ED Provider Notes (Signed)
History     CSN: [id number]  Arrival date & time 12/02/11  4098   First MD Initiated Contact with Patient 12/02/11 [phone]      Chief Complaint  Patient presents with  . Abdominal Pain    (Consider location/radiation/quality/duration/timing/severity/associated sxs/prior treatment) HPI Reginald [name] is a 46 y.o. male presents with c/o upper abdominal pain leading to desire to be assessed in the ED. The sx(s) have been present for 1 day. Additional concerns are the pain radiates through to the back. He had diarrhea several times 24 hours ago; and this morning he has vomited 5 times with yellow emesis. Causative factors are nothing. No suspected food contaminant exposure Palliative factors are nothing. The distress associated is moderate. The disorder has been present for 1 day.   Past Medical History  Diagnosis Date  . Hypertension     History reviewed. No pertinent past surgical history.  History reviewed. No pertinent family history.  History  Substance Use Topics  . Smoking status: Never Smoker   . Smokeless tobacco: Not on file  . Alcohol Use: Yes      Review of Systems  All other systems reviewed and are negative.    Allergies  Ibuprofen  Home Medications   Current Outpatient Rx  Name Route Sig Dispense Refill  . HYDROCHLOROTHIAZIDE 12.5 MG PO CAPS Oral Take 12.5 mg by mouth daily.      BP 139/74  Pulse 62  Temp(Src) 97.9 F (36.6 C) (Oral)  Resp 16  SpO2 97%  Physical Exam  Nursing note and vitals reviewed. Constitutional: He is oriented to person, place, and time. He appears well-developed and well-nourished.  HENT:  Head: Normocephalic and atraumatic.  Right Ear: External ear normal.  Left Ear: External ear normal.  Eyes: Conjunctivae and EOM are normal. Pupils are equal, round, and reactive to light.  Neck: Normal range of motion and phonation normal. Neck supple.  Cardiovascular: Normal rate, regular rhythm, normal heart sounds and intact  distal pulses.   Pulmonary/Chest: Effort normal and breath sounds normal. He exhibits no bony tenderness.  Abdominal: Soft. Normal appearance. He exhibits no mass. There is no tenderness (mild right and left upper quadrant tenderness, nonlocalized.). There is no rebound and no guarding.  Genitourinary:       Mild bilateral costal vertebral angle tenderness, right greater than left  Musculoskeletal: Normal range of motion.  Neurological: He is alert and oriented to person, place, and time. He has normal strength. No cranial nerve deficit or sensory deficit. He exhibits normal muscle tone. Coordination normal.  Skin: Skin is warm, dry and intact.  Psychiatric: He has a normal mood and affect. His behavior is normal. Judgment and thought content normal.    ED Course  Procedures (including critical care time) Emergency department treatment: IV fluids, Dilaudid, and Zofran. Acute abdominal series x-ray is nondiagnostic, CT is ordered for further advanced imaging. Potassium returned low at 3.0; replacement by IV is ordered, with 4 runs.  Labs Reviewed  CBC - Abnormal; Notable for the following:    WBC 12.8 (*)    MCHC 36.8 (*)    All other components within normal limits  DIFFERENTIAL - Abnormal; Notable for the following:    Neutrophils Relative 84 (*)    Neutro Abs 10.8 (*)    Lymphocytes Relative 11 (*)    All other components within normal limits  COMPREHENSIVE METABOLIC PANEL - Abnormal; Notable for the following:    Potassium 3.0 (*)    Glucose, Bld  129 (*)    GFR calc non Af Amer 83 (*)    All other components within normal limits  LIPASE, BLOOD  URINALYSIS, ROUTINE W REFLEX MICROSCOPIC  URINE CULTURE   Dg Abd Acute W/chest  12/02/2011  *RADIOLOGY REPORT*  Clinical Data: Epigastric abdominal pain radiating to the back; nausea and vomiting.  ACUTE ABDOMEN SERIES (ABDOMEN 2 VIEW & CHEST 1 VIEW)  Comparison: None.  Findings: The lungs are well-aerated and clear.  There is no evidence  of focal opacification, pleural effusion or pneumothorax. The cardiomediastinal silhouette is borderline normal in size.  The visualized bowel gas pattern is unremarkable.  Scattered stool and air are seen within the colon; there is no evidence of small bowel dilatation to suggest obstruction.  No free intra-abdominal air is identified on the provided upright view.  No acute osseous abnormalities are seen; the sacroiliac joints are unremarkable in appearance.  IMPRESSION:  1.  Unremarkable bowel gas pattern; no free intra-abdominal air seen. 2.  No acute cardiopulmonary process identified.  Original Report Authenticated By: Tonia Ghent, M.D.   07:56- care to Dr. Rosalia Hammers to evaluate after CT returns.  1. Hypokalemia   2. Abdominal  pain, other specified site   3. Hypertension       MDM  Nonspecific abdominal pain        Flint Melter, MD 12/02/11 [phone]

## 2011-12-02 NOTE — Progress Notes (Signed)
ANTIBIOTIC CONSULT NOTE - INITIAL  Pharmacy Consult for Zosyn Indication: Cholelithiasis, pending cholecystectomy  Allergies  Allergen Reactions  . Ibuprofen Nausea And Vomiting    High doses make him vomit.    Patient Measurements:   Adjusted Body Weight:   Vital Signs: Temp: 97.9 F (36.6 C) (01/15 0219) Temp src: Oral (01/15 0219) BP: 150/94 mmHg (01/15 1101) Pulse Rate: 58  (01/15 1000) Intake/Output from previous day:   Intake/Output from this shift: Total I/O In: 275 [P.O.:275] Out: -   Labs:  Basename 12/02/11 0504  WBC 12.8*  HGB 15.6  PLT 236  LABCREA --  CREATININE 1.05   CrCl is unknown because there is no height on file for the current visit. No results found for this basename: VANCOTROUGH:2,VANCOPEAK:2,VANCORANDOM:2,GENTTROUGH:2,GENTPEAK:2,GENTRANDOM:2,TOBRATROUGH:2,TOBRAPEAK:2,TOBRARND:2,AMIKACINPEAK:2,AMIKACINTROU:2,AMIKACIN:2, in the last 72 hours   Microbiology: No results found for this or any previous visit (from the past 720 hour(s)).  Medical History: Past Medical History  Diagnosis Date  . Hypertension     Medications:  Scheduled:    . dextrose 5 % and 0.45 % NaCl with KCl 40 mEq/L   Intravenous To Major  .  HYDROmorphone (DILAUDID) injection  1 mg Intravenous Once  .  HYDROmorphone (DILAUDID) injection  1 mg Intravenous Once  .  HYDROmorphone (DILAUDID) injection  1 mg Intravenous Once  . iohexol  20 mL Oral Q1 Hr x 2  . ondansetron  4 mg Intravenous Once  . pantoprazole (PROTONIX) IV  40 mg Intravenous QHS  . piperacillin-tazobactam  3.375 g Intravenous NOW  . potassium chloride  10 mEq Intravenous Q1 Hr x 4  . sodium chloride  1,000 mL Intravenous Once  . DISCONTD: HYDROmorphone      . DISCONTD: HYDROmorphone  1 mg Oral Once   Assessment: 46 year old admitted with RUQ pain, exam reveals distended gallbladder. Received orders to dose Zosyn empirically, noted plans for possible cholecystectomy in AM. Noted WBC elevated at  baseline, Scr WNL.  Plan:  1. Zosyn 3.375gm IV now over 30 minutes, then start Zosyn extended interval dosing- each 3.375gm dose infused over 4 hours. 2. Will monitor cx/sens, renal fn and clinical status daily.  Reginald [name] K. Allena Katz, PharmD, BCPS.  Clinical Pharmacist Pager [phone]. 12/02/2011 1:41 PM

## 2011-12-02 NOTE — ED Notes (Signed)
Called the floor to give report and spoke with Okey Regal, Charity fundraiser.  Pt prepared for transport via W/C.

## 2011-12-02 NOTE — ED Notes (Signed)
Hospitalist at bedside for evaluation to admit.  Pt diet status remains NPO

## 2011-12-02 NOTE — ED Provider Notes (Signed)
Patient signed out to me pending ct scan. Ct Abdomen Pelvis W Contrast  12/02/2011  *RADIOLOGY REPORT*  Clinical Data: Epigastric pain with nausea and vomiting.  Elevated white blood count.  CT ABDOMEN AND PELVIS WITH CONTRAST  Technique:  Multidetector CT imaging of the abdomen and pelvis was performed following the standard protocol during bolus administration of intravenous contrast.  Contrast: OMNIPAQUE IOHEXOL 300 MG/ML IV SOLN  Comparison: Radiographs dated 12/02/2011  Findings: There are several cholesterol stones in the slightly prominent gallbladder.  Gallbladder wall is not thickened.  No dilated bile ducts.  Liver, spleen, pancreas, adrenal glands, and abdominal aorta are normal.  Multiple small benign appearing cysts on the left kidney the largest being exophytic and measuring 2.4 cm in diameter in the lower pole.  Single tiny cyst in the medial aspect of the upper pole of the right kidney.  The bowel appears normal including the terminal ileum and appendix. No adenopathy. No acute osseous abnormality.  Mild arthritis of the hips.  IMPRESSION: Multiple cholesterol gallstones with slight distention of the gallbladder.  Otherwise benign-appearing abdomen and pelvis.  Original Report Authenticated By: Gwynn Burly, M.D.   Dg Abd Acute W/chest  12/02/2011  *RADIOLOGY REPORT*  Clinical Data: Epigastric abdominal pain radiating to the back; nausea and vomiting.  ACUTE ABDOMEN SERIES (ABDOMEN 2 VIEW & CHEST 1 VIEW)  Comparison: None.  Findings: The lungs are well-aerated and clear.  There is no evidence of focal opacification, pleural effusion or pneumothorax. The cardiomediastinal silhouette is borderline normal in size.  The visualized bowel gas pattern is unremarkable.  Scattered stool and air are seen within the colon; there is no evidence of small bowel dilatation to suggest obstruction.  No free intra-abdominal air is identified on the provided upright view.  No acute osseous abnormalities  are seen; the sacroiliac joints are unremarkable in appearance.  IMPRESSION:  1.  Unremarkable bowel gas pattern; no free intra-abdominal air seen. 2.  No acute cardiopulmonary process identified.  Original Report Authenticated By: Tonia Ghent, M.D.   Patient's pain began last night and awoke from sleep.  Pain has been constant despite pain meds.  WBC slightly elevated but lft and lipase normal.  No gb wall thickening noted on ct ([place] not done).  General surgery is paged.   Patient discussed with general surgery. Will, PA-C is on call with Dr. Sharee Pimple, MD 12/02/11 1154

## 2011-12-02 NOTE — ED Notes (Addendum)
Pt resting quietly semi fowlers.  NAD noted pt denies n/v but c/o abdominal pain..  Male visitor at bedside

## 2011-12-02 NOTE — ED Notes (Signed)
abd pain since 2300 vomiting early this am

## 2011-12-02 NOTE — ED Notes (Signed)
Pt requested pain medication.  Advised to the pt that he had a floor assignment , now awaiting available room.

## 2011-12-02 NOTE — ED Notes (Signed)
Report received and care assumed from [place], [place]

## 2011-12-02 NOTE — ED Notes (Signed)
Pt given a small amount of ice chips. 

## 2011-12-02 NOTE — ED Notes (Signed)
Pt. Returned from [employer]. Pt. Alert and oriented, pt. Reports improvement in pain after pain med

## 2011-12-02 NOTE — ED Notes (Signed)
Received pt. From triage, pt. Alert and oriented, gait steady, c/o abd. Pain

## 2011-12-02 NOTE — ED Notes (Signed)
Pt unable to get a urine spec

## 2011-12-03 ENCOUNTER — Encounter (HOSPITAL_COMMUNITY): Payer: Self-pay | Admitting: Certified Registered Nurse Anesthetist

## 2011-12-03 ENCOUNTER — Inpatient Hospital Stay (HOSPITAL_COMMUNITY): Payer: BC Managed Care – PPO | Admitting: Certified Registered Nurse Anesthetist

## 2011-12-03 ENCOUNTER — Other Ambulatory Visit (INDEPENDENT_AMBULATORY_CARE_PROVIDER_SITE_OTHER): Payer: Self-pay | Admitting: General Surgery

## 2011-12-03 ENCOUNTER — Inpatient Hospital Stay (HOSPITAL_COMMUNITY): Payer: BC Managed Care – PPO

## 2011-12-03 ENCOUNTER — Encounter (HOSPITAL_COMMUNITY): Admission: EM | Disposition: A | Discharge status: Home / Self Care | Payer: Self-pay | Source: Home / Self Care

## 2011-12-03 DIAGNOSIS — K812 Acute cholecystitis with chronic cholecystitis: Secondary | ICD-10-CM

## 2011-12-03 HISTORY — PX: CHOLECYSTECTOMY: SHX55

## 2011-12-03 LAB — COMPREHENSIVE METABOLIC PANEL
AST: 95 U/L — ABNORMAL HIGH (ref 0–37)
CO2: 31 mEq/L (ref 19–32)
Calcium: 8.7 mg/dL (ref 8.4–10.5)
Creatinine, Ser: 1.02 mg/dL (ref 0.50–1.35)
GFR calc Af Amer: >90 mL/min (ref >90)
GFR calc non Af Amer: 86 mL/min — ABNORMAL LOW (ref >90)
Total Protein: 7.2 g/dL (ref 6.0–8.3)

## 2011-12-03 LAB — CBC
Hemoglobin: 15 g/dL (ref 13.0–17.0)
RBC: 4.67 MIL/uL (ref 4.22–5.81)

## 2011-12-03 IMAGING — RF DG CHOLANGIOGRAM OPERATIVE
1 series · 8 of 8 positions shown · non-contrast
Comparison: CT abdomen and pelvis [DATE].

CLINICAL DATA: Cholecystectomy.  Intraoperative cholangiogram.

INTRAOPERATIVE CHOLANGIOGRAM
TECHNIQUE: Cholangiographic images from the C-arm fluoroscopic
device were submitted for interpretation post-operatively.  Please
see the procedural report for the amount of contrast and the
fluoroscopy time utilized.

[Series 1: run · 2 acquisitions, 8 frames shown]
[im 1/2]
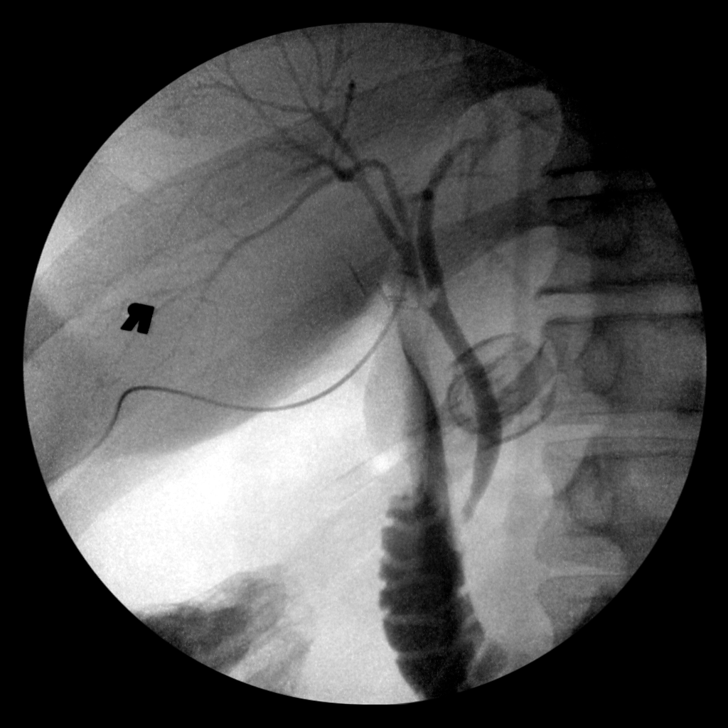
[im 1/2]
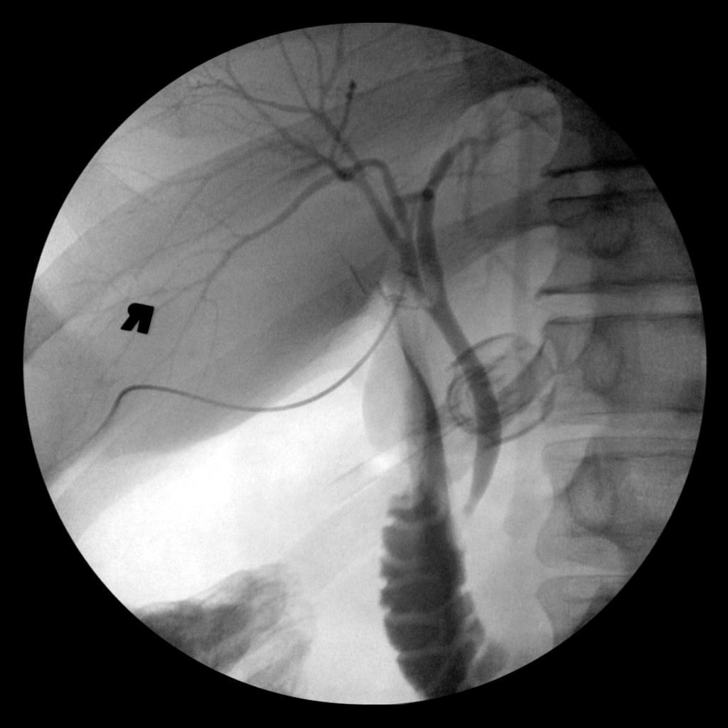
[im 1/2]
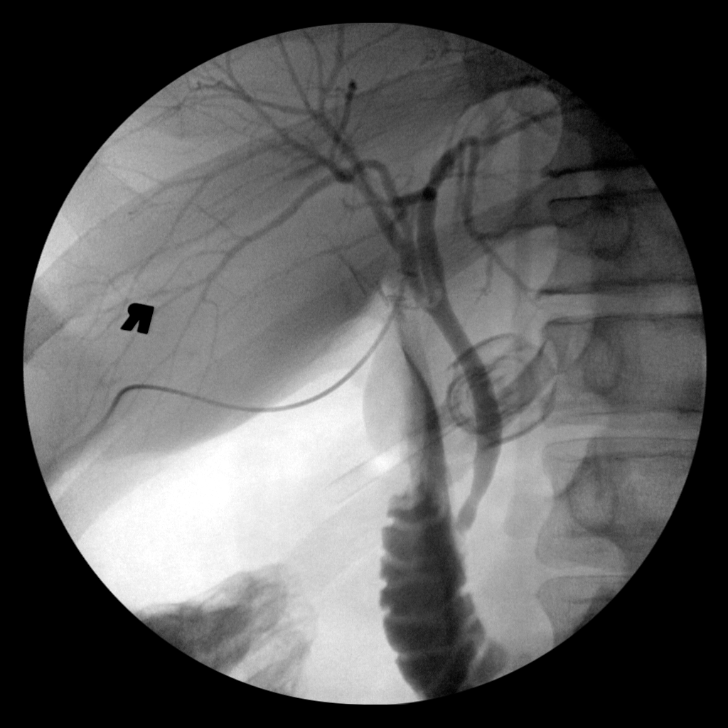
[im 1/2]
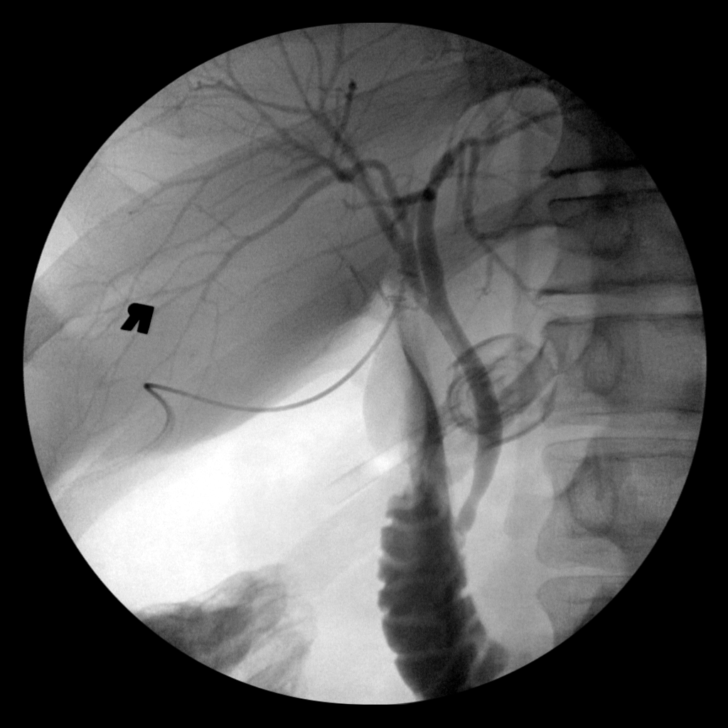
[im 2/2]
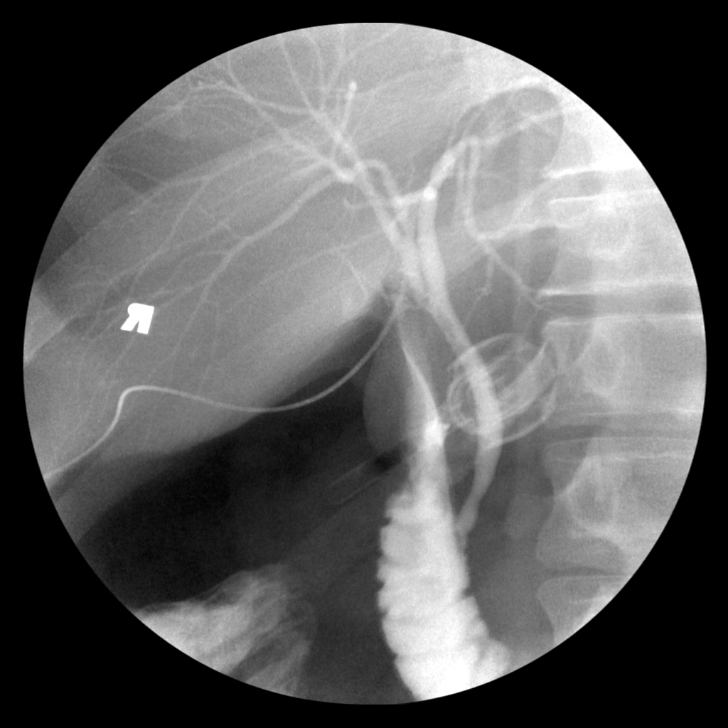
[im 2/2]
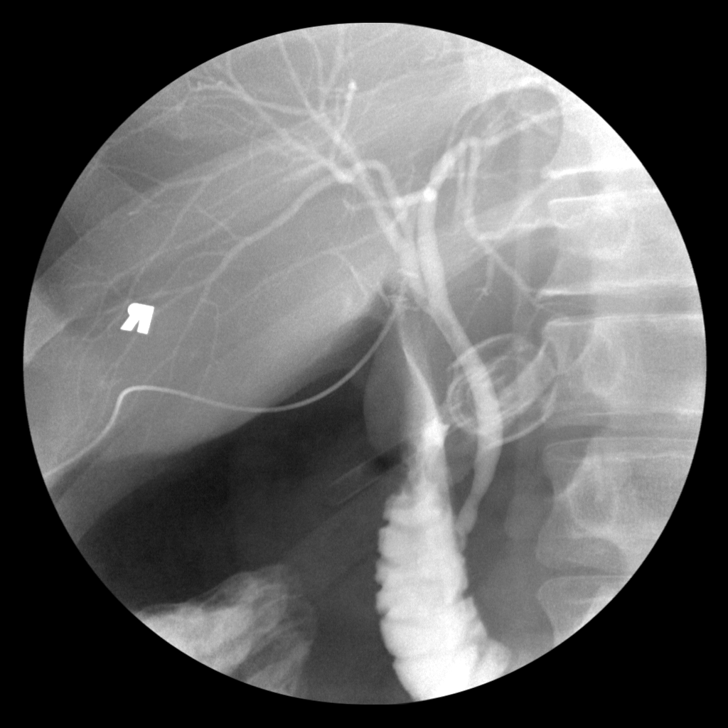
[im 2/2]
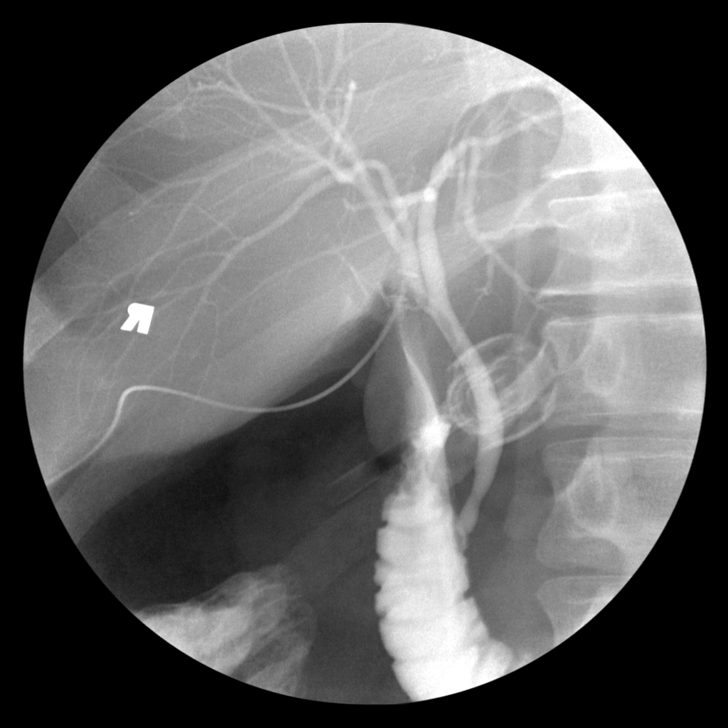
[im 2/2]
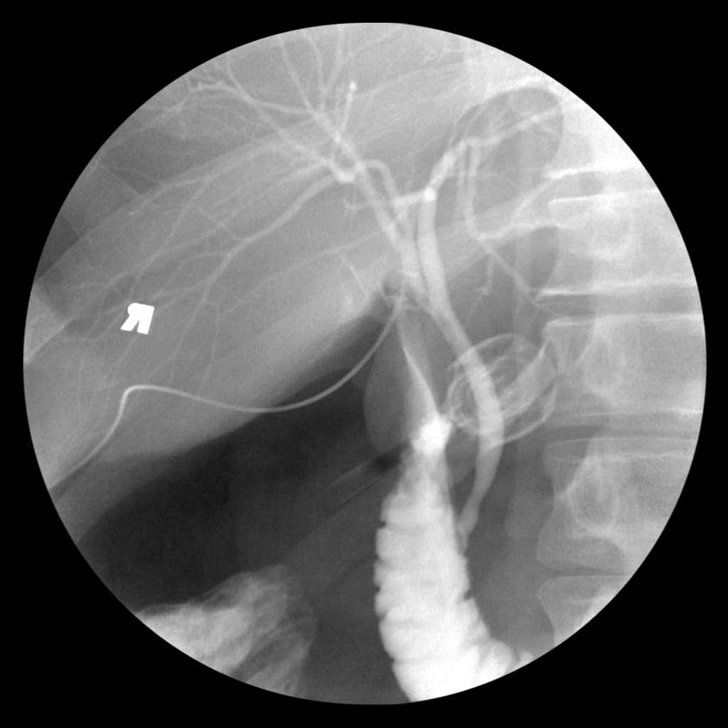

[8 of 8 positions shown; findings below may reference images not displayed]

FINDINGS: It appears that the patient's cystic duct enters the
right hepatic duct.  No biliary ductal dilatation is identified.
There is clearly some air present on the injection.  At the
junction of the right and left hepatic ducts, a persistent small
lucency is identified which may represent a retained stone.  There
is no leakage of contrast and contrast flows into the duodenum.
IMPRESSION: Small lucency at the junction of the right and left hepatic ducts
may represent a retained biliary stone.  Findings were discussed
with Dr. VA at the time interpretation.

## 2011-12-03 SURGERY — LAPAROSCOPIC CHOLECYSTECTOMY WITH INTRAOPERATIVE CHOLANGIOGRAM
Anesthesia: General | Site: Abdomen | Wound class: Contaminated

## 2011-12-03 MED ORDER — MORPHINE SULFATE 2 MG/ML IJ SOLN
2.0000 mg | INTRAMUSCULAR | Status: DC | PRN
  Administered 2011-12-03 (×2): 2 mg via INTRAVENOUS
  Filled 2011-12-03 (×2): qty 1

## 2011-12-03 MED ORDER — PIPERACILLIN-TAZOBACTAM 3.375 G IVPB
3.3750 g | Freq: Three times a day (TID) | INTRAVENOUS | Status: DC
  Administered 2011-12-03 – 2011-12-05 (×5): 3.375 g via INTRAVENOUS
  Filled 2011-12-03 (×7): qty 50

## 2011-12-03 MED ORDER — HEMOSTATIC AGENTS (NO CHARGE) OPTIME
TOPICAL | Status: DC | PRN
  Administered 2011-12-03: 1 via TOPICAL

## 2011-12-03 MED ORDER — IOHEXOL 300 MG/ML  SOLN
INTRAMUSCULAR | Status: DC | PRN
  Administered 2011-12-03: 40 mL

## 2011-12-03 MED ORDER — MEPERIDINE HCL 25 MG/ML IJ SOLN: 6.2500 mg | INTRAMUSCULAR | Status: DC | PRN

## 2011-12-03 MED ORDER — SODIUM CHLORIDE 0.9 % IR SOLN
Status: DC | PRN
  Administered 2011-12-03: 1000 mL

## 2011-12-03 MED ORDER — MIDAZOLAM HCL 5 MG/5ML IJ SOLN
INTRAMUSCULAR | Status: DC | PRN
  Administered 2011-12-03: 2 mg via INTRAVENOUS

## 2011-12-03 MED ORDER — PROPOFOL 10 MG/ML IV EMUL
INTRAVENOUS | Status: DC | PRN
  Administered 2011-12-03: 110 mg via INTRAVENOUS

## 2011-12-03 MED ORDER — ONDANSETRON HCL 4 MG/2ML IJ SOLN: 4.0000 mg | Freq: Once | INTRAMUSCULAR | Status: DC | PRN

## 2011-12-03 MED ORDER — NEOSTIGMINE METHYLSULFATE 1 MG/ML IJ SOLN
INTRAMUSCULAR | Status: DC | PRN
  Administered 2011-12-03: 4 mg via INTRAVENOUS

## 2011-12-03 MED ORDER — KCL IN DEXTROSE-NACL 20-5-0.45 MEQ/L-%-% IV SOLN
INTRAVENOUS | Status: DC
  Administered 2011-12-03 – 2011-12-05 (×4): via INTRAVENOUS
  Filled 2011-12-03 (×7): qty 1000

## 2011-12-03 MED ORDER — HYDROMORPHONE HCL PF 1 MG/ML IJ SOLN
0.2500 mg | INTRAMUSCULAR | Status: DC | PRN
  Administered 2011-12-03: 0.5 mg via INTRAVENOUS
  Administered 2011-12-03: 0.25 mg via INTRAVENOUS

## 2011-12-03 MED ORDER — PANTOPRAZOLE SODIUM 40 MG IV SOLR
40.0000 mg | INTRAVENOUS | Status: DC
  Administered 2011-12-03 – 2011-12-04 (×2): 40 mg via INTRAVENOUS
  Filled 2011-12-03 (×3): qty 40

## 2011-12-03 MED ORDER — LACTATED RINGERS IV SOLN
INTRAVENOUS | Status: DC | PRN
  Administered 2011-12-03 (×2): via INTRAVENOUS

## 2011-12-03 MED ORDER — MORPHINE SULFATE 2 MG/ML IJ SOLN: 0.0500 mg/kg | INTRAMUSCULAR | Status: DC | PRN

## 2011-12-03 MED ORDER — FENTANYL CITRATE 0.05 MG/ML IJ SOLN
INTRAMUSCULAR | Status: DC | PRN
  Administered 2011-12-03: 50 ug via INTRAVENOUS
  Administered 2011-12-03: 250 ug via INTRAVENOUS
  Administered 2011-12-03 (×7): 50 ug via INTRAVENOUS

## 2011-12-03 MED ORDER — ONDANSETRON HCL 4 MG/2ML IJ SOLN
INTRAMUSCULAR | Status: DC | PRN
  Administered 2011-12-03: 4 mg via INTRAVENOUS

## 2011-12-03 MED ORDER — ROCURONIUM BROMIDE 100 MG/10ML IV SOLN
INTRAVENOUS | Status: DC | PRN
  Administered 2011-12-03 (×2): 10 mg via INTRAVENOUS
  Administered 2011-12-03: 50 mg via INTRAVENOUS

## 2011-12-03 MED ORDER — GLYCOPYRROLATE 0.2 MG/ML IJ SOLN
INTRAMUSCULAR | Status: DC | PRN
  Administered 2011-12-03: .6 mg via INTRAVENOUS

## 2011-12-03 MED ORDER — LACTATED RINGERS IV SOLN
INTRAVENOUS | Status: DC
  Administered 2011-12-03: 12:00:00 via INTRAVENOUS

## 2011-12-03 MED ORDER — ONDANSETRON HCL 4 MG PO TABS: 4.0000 mg | ORAL_TABLET | Freq: Four times a day (QID) | ORAL | Status: DC | PRN

## 2011-12-03 MED ORDER — HYDROCHLOROTHIAZIDE 12.5 MG PO CAPS
12.5000 mg | ORAL_CAPSULE | Freq: Every day | ORAL | Status: DC
  Administered 2011-12-03 – 2011-12-04 (×2): 12.5 mg via ORAL
  Filled 2011-12-03 (×3): qty 1

## 2011-12-03 MED ORDER — ONDANSETRON HCL 4 MG/2ML IJ SOLN: 4.0000 mg | Freq: Four times a day (QID) | INTRAMUSCULAR | Status: DC | PRN

## 2011-12-03 MED ORDER — OXYCODONE-ACETAMINOPHEN 5-325 MG PO TABS
1.0000 | ORAL_TABLET | ORAL | Status: DC | PRN
  Administered 2011-12-04 (×3): 2 via ORAL
  Filled 2011-12-03 (×3): qty 2

## 2011-12-03 MED ORDER — BUPIVACAINE-EPINEPHRINE 0.25% -1:200000 IJ SOLN
INTRAMUSCULAR | Status: DC | PRN
  Administered 2011-12-03: 10 mL

## 2011-12-03 MED ORDER — PHENYLEPHRINE HCL 10 MG/ML IJ SOLN
INTRAMUSCULAR | Status: DC | PRN
  Administered 2011-12-03 (×2): 80 ug via INTRAVENOUS

## 2011-12-03 SURGICAL SUPPLY — 57 items
APPLIER CLIP 5 13 M/L LIGAMAX5 (MISCELLANEOUS) ×2
BANDAGE ADHESIVE 1X3 (GAUZE/BANDAGES/DRESSINGS) ×6 IMPLANT
BENZOIN TINCTURE PRP APPL 2/3 (GAUZE/BANDAGES/DRESSINGS) ×2 IMPLANT
BLADE SURG ROTATE 9660 (MISCELLANEOUS) IMPLANT
CANISTER SUCTION 2500CC (MISCELLANEOUS) ×2 IMPLANT
CHLORAPREP W/TINT 26ML (MISCELLANEOUS) ×2 IMPLANT
CLIP APPLIE 5 13 M/L LIGAMAX5 (MISCELLANEOUS) ×1 IMPLANT
CLOTH BEACON ORANGE TIMEOUT ST (SAFETY) ×2 IMPLANT
COVER MAYO STAND STRL (DRAPES) IMPLANT
COVER SURGICAL LIGHT HANDLE (MISCELLANEOUS) ×2 IMPLANT
DECANTER SPIKE VIAL GLASS SM (MISCELLANEOUS) ×2 IMPLANT
DRAIN CHANNEL 19F RND (DRAIN) ×2 IMPLANT
DRAPE C-ARM 42X72 X-RAY (DRAPES) IMPLANT
DRAPE UTILITY 15X26 W/TAPE STR (DRAPE) ×4 IMPLANT
DRSG TEGADERM 4X4.75 (GAUZE/BANDAGES/DRESSINGS) ×4 IMPLANT
ELECT REM PT RETURN 9FT ADLT (ELECTROSURGICAL) ×2
ELECTRODE REM PT RTRN 9FT ADLT (ELECTROSURGICAL) ×1 IMPLANT
ENDOLOOP SUT PDS II  0 18 (SUTURE)
ENDOLOOP SUT PDS II 0 18 (SUTURE) IMPLANT
EVACUATOR SILICONE 100CC (DRAIN) ×2 IMPLANT
GAUZE SPONGE 2X2 8PLY STRL LF (GAUZE/BANDAGES/DRESSINGS) ×2 IMPLANT
GLOVE BIO SURGEON STRL SZ 6.5 (GLOVE) ×2 IMPLANT
GLOVE BIOGEL M STRL SZ7.5 (GLOVE) ×4 IMPLANT
GLOVE BIOGEL PI IND STRL 7.0 (GLOVE) ×3 IMPLANT
GLOVE BIOGEL PI IND STRL 8 (GLOVE) ×2 IMPLANT
GLOVE BIOGEL PI INDICATOR 7.0 (GLOVE) ×3
GLOVE BIOGEL PI INDICATOR 8 (GLOVE) ×2
GLOVE SS BIOGEL STRL SZ 8 (GLOVE) ×1 IMPLANT
GLOVE SUPERSENSE BIOGEL SZ 8 (GLOVE) ×1
GLOVE SURG SS PI 6.5 STRL IVOR (GLOVE) ×4 IMPLANT
GOWN STRL NON-REIN LRG LVL3 (GOWN DISPOSABLE) ×6 IMPLANT
GOWN STRL REIN XL XLG (GOWN DISPOSABLE) ×4 IMPLANT
HEMOSTAT SNOW SURGICEL 2X4 (HEMOSTASIS) ×2 IMPLANT
IV NS IRRIG 3000ML ARTHROMATIC (IV SOLUTION) ×2 IMPLANT
KIT BASIN OR (CUSTOM PROCEDURE TRAY) ×2 IMPLANT
KIT ROOM TURNOVER OR (KITS) ×2 IMPLANT
NS IRRIG 1000ML POUR BTL (IV SOLUTION) ×2 IMPLANT
PAD ARMBOARD 7.5X6 YLW CONV (MISCELLANEOUS) ×4 IMPLANT
POUCH SPECIMEN RETRIEVAL 10MM (ENDOMECHANICALS) ×2 IMPLANT
SCISSORS LAP 5X35 DISP (ENDOMECHANICALS) IMPLANT
SET CHOLANGIOGRAPH 5 50 .035 (SET/KITS/TRAYS/PACK) IMPLANT
SET IRRIG TUBING LAPAROSCOPIC (IRRIGATION / IRRIGATOR) ×2 IMPLANT
SLEEVE ENDOPATH XCEL 5M (ENDOMECHANICALS) ×4 IMPLANT
SPECIMEN JAR SMALL (MISCELLANEOUS) ×2 IMPLANT
SPONGE GAUZE 2X2 STER 10/PKG (GAUZE/BANDAGES/DRESSINGS) ×2
STRIP CLOSURE SKIN 1/2X4 (GAUZE/BANDAGES/DRESSINGS) ×2 IMPLANT
SUT ETHILON 2 0 FS 18 (SUTURE) ×2 IMPLANT
SUT MNCRL AB 4-0 PS2 18 (SUTURE) ×2 IMPLANT
SUT PDS 2 0 (SUTURE) ×2 IMPLANT
SUT VICRYL 0 UR6 27IN ABS (SUTURE) IMPLANT
TOWEL OR 17X24 6PK STRL BLUE (TOWEL DISPOSABLE) ×2 IMPLANT
TOWEL OR 17X26 10 PK STRL BLUE (TOWEL DISPOSABLE) ×2 IMPLANT
TRAY LAPAROSCOPIC (CUSTOM PROCEDURE TRAY) ×2 IMPLANT
TROCAR XCEL BLUNT TIP 100MML (ENDOMECHANICALS) ×2 IMPLANT
TROCAR XCEL NON-BLD 11X100MML (ENDOMECHANICALS) ×2 IMPLANT
TROCAR XCEL NON-BLD 5MMX100MML (ENDOMECHANICALS) ×2 IMPLANT
WATER STERILE IRR 1000ML POUR (IV SOLUTION) IMPLANT

## 2011-12-03 NOTE — H&P (Signed)
HPI as above.  Alert, nad cta rrr Soft, nt, nd No jaundice No scleral icterus   Symptomatic cholelithiasis, probable acute cholecystitis  Admit, ivf, iv abx Lap chole +ioc  I saw the patient, participated in the history, exam and medical decision making, and concur with the physician assistant's note above.  Reginald [name]. Andrey Campanile, MD, FACS General, Bariatric, & Minimally Invasive Surgery [facility], [place]

## 2011-12-03 NOTE — Op Note (Signed)
Laparoscopic Cholecystectomy with IOC Procedure Note  Indications: This patient presents with symptomatic gallbladder disease and will undergo laparoscopic cholecystectomy.  Pre-operative Diagnosis: Calculus of gallbladder with acute cholecystitis, without mention of obstruction  Post-operative Diagnosis: Same  Surgeon: Atilano Ina   Assistants: Burning, PA-C  Anesthesia: General endotracheal anesthesia and Local anesthesia 0.25% marcaine with epi  ASA Class: 2  Procedure Details  The patient was seen again in the Holding Room. The risks, benefits, complications, treatment options, and expected outcomes were discussed with the patient. The possibilities of reaction to medication, pulmonary aspiration, perforation of viscus, bleeding, recurrent infection, finding a normal gallbladder, the need for additional procedures, failure to diagnose a condition, the possible need to convert to an open procedure, and creating a complication requiring transfusion or operation were discussed with the patient. The likelihood of improving the patient's symptoms with return to their baseline status is good.  The patient and/or family concurred with the proposed plan, giving informed consent. The site of surgery properly noted. The patient was taken to Operating Room, identified as Reginald [name] and the procedure verified as Laparoscopic Cholecystectomy with Intraoperative Cholangiogram. A Time Out was held and the above information confirmed.  Prior to the induction of general anesthesia, antibiotic prophylaxis was administered. General endotracheal anesthesia was then administered and tolerated well. After the induction, the abdomen was prepped with Chloraprep and draped in the sterile fashion. The patient was positioned in the supine position.  Local anesthetic agent was injected into the skin near the umbilicus and an incision made. We dissected down to the abdominal fascia with blunt dissection.  The  fascia was incised vertically and we entered the peritoneal cavity bluntly.  A pursestring suture of 0-Vicryl was placed around the fascial opening.  The Hasson cannula was inserted and secured with the stay suture.  Pneumoperitoneum was then created with CO2 and tolerated well without any adverse changes in the patient's vital signs. An 5-mm port was placed in the subxiphoid position.  Two 5-mm ports were placed in the right upper quadrant. All skin incisions were infiltrated with a local anesthetic agent before making the incision and placing the trocars.   We positioned the patient in reverse Trendelenburg, tilted slightly to the patient's left.  The gallbladder was identified, the fundus could not be grasped because it was distended and thick-walled consistent with acute cholecystitis. The gallbladder was aspirated which made it easier to retract cephalad. There was a fair amount of inflammatory rind around the gallbladder. Adhesions were lysed bluntly and with the electrocautery where indicated, taking care not to injure any adjacent organs or viscus. The infundibulum was grasped and retracted laterally, exposing the peritoneum overlying the triangle of Calot. This was then divided and exposed in a blunt fashion. Blunt dissection was performed with the suction irrigator catheter. It took about 30 minutes to mobilize and identify the cystic duct. There was a small arterial vessel entering the gallbladder on the lateral wall.  This was clipped and divided with electrocautery.  A critical view of the cystic duct and cystic artery was obtained.  The cystic duct was clearly identified and bluntly dissected circumferentially. The cystic duct was foreshortened. The cystic duct was ligated with a clip distally.   An incision was made in the cystic duct and the [facility] cholangiogram catheter introduced. The catheter was secured using a clip. A cholangiogram was then obtained which showed good visualization of the distal  and proximal biliary tree with no sign of filling obvious defects or  obstruction. There appeared to be a air bubble in the common bile duct. The cyst duct was short.  Contrast flowed easily into the duodenum. The catheter was then removed.   The cystic duct was then ligated with 1 clip and divided. A PDS endo-loop was then placed around the end of the duct. The cystic artery was identified, dissected free, ligated with clips and divided as well.   The gallbladder was dissected from the liver bed in retrograde fashion with the electrocautery. The gallbladder wall was very thickened and the posterior wall was fused to the liver. The gallbladder was also intrahepatic up near the dome. The gallbladder was entered with spillage of 2 stones.  The 5 mm subxiphoid trocar was upsized to an 11mm trocar and the 2 stones were retrieved using the stone grabber. The gallbladder was removed and placed in an Endocatch sac. The liver bed was irrigated and inspected. Hemostasis was achieved with the electrocautery. Copious irrigation was utilized and was repeatedly aspirated until clear.  The gallbladder and Endocatch sac were then removed through the umbilical port site. The endocatch sac with the gallbladder was large to come thru the umbilical fascial defect. I enlarged it slightly; however, the bag tore. There were 3 gallstones that fell into the abdominal cavity. 2 clamps were placed on the gallbladder and it was opened. The gallbladder was easily extracted thru the umbilicus at this point. The Hasson trocar was replaced and pneumoperitoneum was re-established.  The 3 spilled gallstones were retrieved and removed from the abdominal cavity. There was no evidence of any other spilled stones.   We again inspected the right upper quadrant for hemostasis.  A piece of Ethicon surgical SNoW was placed in the gallbladder fossa. I elected to leave a 19Fr drain in the right upper quadrant. It was brought out the most lateral right  sided abdominal wall trocar and secured to the skin with a 2-0 nylon. The pursestring suture was used to close the umbilical fascia.  The closure was viewed laparoscopically and it was airtight and no bowel was in closure.  Pneumoperitoneum was released as we removed the trocars.  4-0 Monocryl was used to close the skin.   Benzoin, steri-strips, and clean dressings were applied. The patient was then extubated and brought to the recovery room in stable condition. Instrument, sponge, and needle counts were correct at closure and at the conclusion of the case.   Findings: Cholecystitis with Cholelithiasis; +Ethicon surgical SNoW  Estimated Blood Loss: less than 100 mL         Drains: 19Fr RUQ, gallbladder fossa         Specimens: Gallbladder           Complications: None; patient tolerated the procedure well.         Disposition: PACU - hemodynamically stable.         Condition: stable  Mary Sella. Andrey Campanile, MD, FACS General, Bariatric, & Minimally Invasive Surgery [facility], [place]

## 2011-12-03 NOTE — Interval H&P Note (Signed)
History and Physical Interval Note:  12/03/2011 9:27 AM  Reginald [name]  has presented today for surgery, with the diagnosis of symptomatic cholelithiasis, probable acute cholecystitis  The various methods of treatment have been discussed with the patient and family. After consideration of risks, benefits and other options for treatment, the patient has consented to  Procedure(s): LAPAROSCOPIC CHOLECYSTECTOMY WITH INTRAOPERATIVE CHOLANGIOGRAM as a surgical intervention .  The patients' history has been reviewed, patient examined, no change in status, stable for surgery.  I have reviewed the patients' chart and labs.  Questions were answered to the patient's satisfaction.    Mary Sella. Andrey Campanile, MD, FACS General, Bariatric, & Minimally Invasive Surgery [facility], [place]   [facility]

## 2011-12-03 NOTE — Anesthesia Procedure Notes (Signed)
Procedure Name: Intubation Date/Time: 12/03/2011 1:29 PM Performed by: Shirlyn Goltz, TOM Pre-anesthesia Checklist: Patient identified, Emergency Drugs available, Suction available, Patient being monitored and Timeout performed Patient Re-evaluated:Patient Re-evaluated prior to inductionOxygen Delivery Method: Circle System Utilized Preoxygenation: Pre-oxygenation with 100% oxygen Intubation Type: IV induction Ventilation: Mask ventilation without difficulty Laryngoscope Size: Miller and 2 Grade View: Grade I Tube type: Oral Tube size: 7.5 mm Airway Equipment and Method: stylet Placement Confirmation: ETT inserted through vocal cords under direct vision,  positive ETCO2 and breath sounds checked- equal and bilateral Secured at: 23 cm Tube secured with: Tape Dental Injury: Teeth and Oropharynx as per pre-operative assessment

## 2011-12-03 NOTE — Anesthesia Postprocedure Evaluation (Signed)
  Anesthesia Post-op Note  Patient: Reginald [name]  Procedure(s) Performed:  LAPAROSCOPIC CHOLECYSTECTOMY WITH INTRAOPERATIVE CHOLANGIOGRAM - laparoscopic cholecystectomy with intraoperative cholangiogram  Patient Location: PACU  Anesthesia Type: General  Level of Consciousness: awake  Airway and Oxygen Therapy: Patient connected to nasal cannula oxygen  Post-op Pain: mild  Post-op Assessment: Post-op Vital signs reviewed  Post-op Vital Signs: stable  Complications: No apparent anesthesia complications

## 2011-12-03 NOTE — Transfer of Care (Signed)
Immediate Anesthesia Transfer of Care Note  Patient: Reginald [name]  Procedure(s) Performed:  LAPAROSCOPIC CHOLECYSTECTOMY WITH INTRAOPERATIVE CHOLANGIOGRAM - laparoscopic cholecystectomy with intraoperative cholangiogram  Patient Location: PACU  Anesthesia Type: General  Level of Consciousness: awake, alert  and oriented  Airway & Oxygen Therapy: Patient Spontanous Breathing and Patient connected to nasal cannula oxygen  Post-op Assessment: Report given to PACU RN and Post -op Vital signs reviewed and stable  Post vital signs: Reviewed and stable Filed Vitals:   12/03/11 1545  BP:   Pulse:   Temp: 36.8 C  Resp:     Complications: No apparent anesthesia complications

## 2011-12-03 NOTE — H&P (View-Only) (Signed)
Subjective: Feels better. Less pain. No n/v.   Objective: Vital signs in last 24 hours: Temp:  [98.6 F (37 C)-99.1 F (37.3 C)] 99.1 F (37.3 C) (01/16 0556) Pulse Rate:  [56-65] 65  (01/16 0556) Resp:  [18] 18  (01/16 0556) BP: (126-150)/(64-94) 127/78 mmHg (01/16 0556) SpO2:  [96 %-98 %] 97 % (01/16 0556) Weight:  [220 lb (99.791 kg)] 220 lb (99.791 kg) (01/15 1349) Last BM Date: 12/01/11  Intake/Output from previous day: 01/15 0701 - 01/16 0700 In: 1857 [P.O.:635; I.V.:1222] Out: -  Intake/Output this shift:    Alert, nad, no scleral icterus cta Reg Soft, nt, nd No edema  Lab Results:   Basename 12/03/11 0455 12/02/11 0504  WBC 11.3* 12.8*  HGB 15.0 15.6  HCT 41.1 42.4  PLT 189 236   BMET  Basename 12/03/11 0455 12/02/11 0504  NA 140 140  K 3.5 3.0*  CL 103 99  CO2 31 31  GLUCOSE 127* 129*  BUN 6 11  CREATININE 1.02 1.05  CALCIUM 8.7 9.7   PT/INR No results found for this basename: LABPROT:2,INR:2 in the last 72 hours ABG No results found for this basename: PHART:2,PCO2:2,PO2:2,HCO3:2 in the last 72 hours  Studies/Results: Ct Abdomen Pelvis W Contrast  12/02/2011  *RADIOLOGY REPORT*  Clinical Data: Epigastric pain with nausea and vomiting.  Elevated white blood count.  CT ABDOMEN AND PELVIS WITH CONTRAST  Technique:  Multidetector CT imaging of the abdomen and pelvis was performed following the standard protocol during bolus administration of intravenous contrast.  Contrast: OMNIPAQUE IOHEXOL 300 MG/ML IV SOLN  Comparison: Radiographs dated 12/02/2011  Findings: There are several cholesterol stones in the slightly prominent gallbladder.  Gallbladder wall is not thickened.  No dilated bile ducts.  Liver, spleen, pancreas, adrenal glands, and abdominal aorta are normal.  Multiple small benign appearing cysts on the left kidney the largest being exophytic and measuring 2.4 cm in diameter in the lower pole.  Single tiny cyst in the medial aspect of  the upper pole of the right kidney.  The bowel appears normal including the terminal ileum and appendix. No adenopathy. No acute osseous abnormality.  Mild arthritis of the hips.  IMPRESSION: Multiple cholesterol gallstones with slight distention of the gallbladder.  Otherwise benign-appearing abdomen and pelvis.  Original Report Authenticated By: Gwynn Burly, M.D.   Dg Abd Acute W/chest  12/02/2011  *RADIOLOGY REPORT*  Clinical Data: Epigastric abdominal pain radiating to the back; nausea and vomiting.  ACUTE ABDOMEN SERIES (ABDOMEN 2 VIEW & CHEST 1 VIEW)  Comparison: None.  Findings: The lungs are well-aerated and clear.  There is no evidence of focal opacification, pleural effusion or pneumothorax. The cardiomediastinal silhouette is borderline normal in size.  The visualized bowel gas pattern is unremarkable.  Scattered stool and air are seen within the colon; there is no evidence of small bowel dilatation to suggest obstruction.  No free intra-abdominal air is identified on the provided upright view.  No acute osseous abnormalities are seen; the sacroiliac joints are unremarkable in appearance.  IMPRESSION:  1.  Unremarkable bowel gas pattern; no free intra-abdominal air seen. 2.  No acute cardiopulmonary process identified.  Original Report Authenticated By: Tonia Ghent, M.D.    Anti-infectives: Anti-infectives     Start     Dose/Rate Route Frequency Ordered Stop   12/02/11 2200   piperacillin-tazobactam (ZOSYN) IVPB 3.375 g        3.375 g 12.5 mL/hr over 240 Minutes Intravenous 3 times per day  12/02/11 1557     12/02/11 1615   piperacillin-tazobactam (ZOSYN) IVPB 3.375 g        3.375 g 100 mL/hr over 30 Minutes Intravenous NOW 12/02/11 1602 12/02/11 1659   12/02/11 1330   piperacillin-tazobactam (ZOSYN) IVPB 3.375 g  Status:  Discontinued        3.375 g 100 mL/hr over 30 Minutes Intravenous NOW 12/02/11 1317 12/02/11 1602          Assessment/Plan: HTN - bp ok Symptomatic  cholelithiasis, probable acute cholecystitis- lfts bumped overnight but will proceed with LC with IOC first. Cont IV abx  We discussed gallbladder disease.  We discussed non-operative and operative management.   I discussed laparoscopic cholecystectomy with IOC in detail.  The patient was given  diagrams detailing the procedure.  We discussed the risks and benefits of a laparoscopic cholecystectomy including, but not limited to bleeding, infection, injury to surrounding structures such as the intestine or liver, bile leak, retained gallstones, need to convert to an open procedure, prolonged diarrhea, blood clots such as  DVT, common bile duct injury, anesthesia risks, and possible need for additional procedures.  We discussed the typical post-operative recovery course. I explained that the likelihood of improvement of their symptoms is good.  Mary Sella. Andrey Campanile, MD, FACS General, Bariatric, & Minimally Invasive Surgery [facility], [place]   LOS: 1 day    Atilano Ina 12/03/2011

## 2011-12-03 NOTE — Preoperative (Signed)
Beta Blockers   Reason not to administer Beta Blockers:Not Applicable 

## 2011-12-03 NOTE — Anesthesia Preprocedure Evaluation (Addendum)
Anesthesia Evaluation  Patient identified by MRN, date of birth, ID band Patient awake    Reviewed: Allergy & Precautions, H&P , NPO status , Patient's Chart, lab work & pertinent test results  History of Anesthesia Complications Negative for: history of anesthetic complications  Airway Mallampati: III TM Distance: >3 FB Neck ROM: Full    Dental  (+) Dental Advisory Given   Pulmonary neg pulmonary ROS,          Cardiovascular hypertension, Pt. on medications     Neuro/Psych Negative Neurological ROS  Negative Psych ROS   GI/Hepatic Neg liver ROS, GERD-  Controlled,  Endo/Other  Negative Endocrine ROS  Renal/GU negative Renal ROS  Genitourinary negative   Musculoskeletal negative musculoskeletal ROS (+)   Abdominal   Peds  Hematology negative hematology ROS (+)   Anesthesia Other Findings   Reproductive/Obstetrics                           Anesthesia Physical Anesthesia Plan  ASA: II  Anesthesia Plan: General   Post-op Pain Management:    Induction: Intravenous  Airway Management Planned: Oral ETT  Additional Equipment:   Intra-op Plan:   Post-operative Plan:   Informed Consent: I have reviewed the patients History and Physical, chart, labs and discussed the procedure including the risks, benefits and alternatives for the proposed anesthesia with the patient or authorized representative who has indicated his/her understanding and acceptance.     Plan Discussed with: CRNA and Surgeon  Anesthesia Plan Comments:        Anesthesia Quick Evaluation

## 2011-12-03 NOTE — Progress Notes (Signed)
Subjective: Feels better. Less pain. No n/v.   Objective: Vital signs in last 24 hours: Temp:  [98.6 F (37 C)-99.1 F (37.3 C)] 99.1 F (37.3 C) (01/16 0556) Pulse Rate:  [56-65] 65  (01/16 0556) Resp:  [18] 18  (01/16 0556) BP: (126-150)/(64-94) 127/78 mmHg (01/16 0556) SpO2:  [96 %-98 %] 97 % (01/16 0556) Weight:  [220 lb (99.791 kg)] 220 lb (99.791 kg) (01/15 1349) Last BM Date: 12/01/11  Intake/Output from previous day: 01/15 0701 - 01/16 0700 In: 1857 [P.O.:635; I.V.:1222] Out: -  Intake/Output this shift:    Alert, nad, no scleral icterus cta Reg Soft, nt, nd No edema  Lab Results:   Basename 12/03/11 0455 12/02/11 0504  WBC 11.3* 12.8*  HGB 15.0 15.6  HCT 41.1 42.4  PLT 189 236   BMET  Basename 12/03/11 0455 12/02/11 0504  NA 140 140  K 3.5 3.0*  CL 103 99  CO2 31 31  GLUCOSE 127* 129*  BUN 6 11  CREATININE 1.02 1.05  CALCIUM 8.7 9.7   PT/INR No results found for this basename: LABPROT:2,INR:2 in the last 72 hours ABG No results found for this basename: PHART:2,PCO2:2,PO2:2,HCO3:2 in the last 72 hours  Studies/Results: Ct Abdomen Pelvis W Contrast  12/02/2011  *RADIOLOGY REPORT*  Clinical Data: Epigastric pain with nausea and vomiting.  Elevated white blood count.  CT ABDOMEN AND PELVIS WITH CONTRAST  Technique:  Multidetector CT imaging of the abdomen and pelvis was performed following the standard protocol during bolus administration of intravenous contrast.  Contrast: 100mL OMNIPAQUE IOHEXOL 300 MG/ML IV SOLN  Comparison: Radiographs dated 12/02/2011  Findings: There are several cholesterol stones in the slightly prominent gallbladder.  Gallbladder wall is not thickened.  No dilated bile ducts.  Liver, spleen, pancreas, adrenal glands, and abdominal aorta are normal.  Multiple small benign appearing cysts on the left kidney the largest being exophytic and measuring 2.4 cm in diameter in the lower pole.  Single tiny cyst in the medial aspect of  the upper pole of the right kidney.  The bowel appears normal including the terminal ileum and appendix. No adenopathy. No acute osseous abnormality.  Mild arthritis of the hips.  IMPRESSION: Multiple cholesterol gallstones with slight distention of the gallbladder.  Otherwise benign-appearing abdomen and pelvis.  Original Report Authenticated By: JAMES H. MAXWELL, M.D.   Dg Abd Acute W/chest  12/02/2011  *RADIOLOGY REPORT*  Clinical Data: Epigastric abdominal pain radiating to the back; nausea and vomiting.  ACUTE ABDOMEN SERIES (ABDOMEN 2 VIEW & CHEST 1 VIEW)  Comparison: None.  Findings: The lungs are well-aerated and clear.  There is no evidence of focal opacification, pleural effusion or pneumothorax. The cardiomediastinal silhouette is borderline normal in size.  The visualized bowel gas pattern is unremarkable.  Scattered stool and air are seen within the colon; there is no evidence of small bowel dilatation to suggest obstruction.  No free intra-abdominal air is identified on the provided upright view.  No acute osseous abnormalities are seen; the sacroiliac joints are unremarkable in appearance.  IMPRESSION:  1.  Unremarkable bowel gas pattern; no free intra-abdominal air seen. 2.  No acute cardiopulmonary process identified.  Original Report Authenticated By: JEFFREY CHANG, M.D.    Anti-infectives: Anti-infectives     Start     Dose/Rate Route Frequency Ordered Stop   12/02/11 2200   piperacillin-tazobactam (ZOSYN) IVPB 3.375 g        3.375 g 12.5 mL/hr over 240 Minutes Intravenous 3 times per day   12/02/11 1557     12/02/11 1615   piperacillin-tazobactam (ZOSYN) IVPB 3.375 g        3.375 g 100 mL/hr over 30 Minutes Intravenous NOW 12/02/11 1602 12/02/11 1659   12/02/11 1330   piperacillin-tazobactam (ZOSYN) IVPB 3.375 g  Status:  Discontinued        3.375 g 100 mL/hr over 30 Minutes Intravenous NOW 12/02/11 1317 12/02/11 1602          Assessment/Plan: HTN - bp ok Symptomatic  cholelithiasis, probable acute cholecystitis- lfts bumped overnight but will proceed with LC with IOC first. Cont IV abx  We discussed gallbladder disease.  We discussed non-operative and operative management.   I discussed laparoscopic cholecystectomy with IOC in detail.  The patient was given  diagrams detailing the procedure.  We discussed the risks and benefits of a laparoscopic cholecystectomy including, but not limited to bleeding, infection, injury to surrounding structures such as the intestine or liver, bile leak, retained gallstones, need to convert to an open procedure, prolonged diarrhea, blood clots such as  DVT, common bile duct injury, anesthesia risks, and possible need for additional procedures.  We discussed the typical post-operative recovery course. I explained that the likelihood of improvement of their symptoms is good.  Ason Heslin M. Clyde Upshaw, MD, FACS General, Bariatric, & Minimally Invasive Surgery [facility], [place]   LOS: 1 day    Delina Kruczek M 12/03/2011  

## 2011-12-04 ENCOUNTER — Encounter (HOSPITAL_COMMUNITY): Payer: Self-pay | Admitting: General Surgery

## 2011-12-04 LAB — CBC
Platelets: 188 10*3/uL (ref 150–400)
RDW: 12.6 % (ref 11.5–15.5)
WBC: 10.6 10*3/uL — ABNORMAL HIGH (ref 4.0–10.5)

## 2011-12-04 LAB — MAGNESIUM: Magnesium: 2 mg/dL (ref 1.5–2.5)

## 2011-12-04 LAB — COMPREHENSIVE METABOLIC PANEL
CO2: 28 mEq/L (ref 19–32)
Calcium: 8.4 mg/dL (ref 8.4–10.5)
Creatinine, Ser: 1.08 mg/dL (ref 0.50–1.35)
GFR calc Af Amer: >90 mL/min (ref >90)
GFR calc non Af Amer: 81 mL/min — ABNORMAL LOW (ref >90)
Glucose, Bld: 106 mg/dL — ABNORMAL HIGH (ref 70–99)

## 2011-12-04 LAB — PHOSPHORUS: Phosphorus: 2.7 mg/dL (ref 2.3–4.6)

## 2011-12-04 MED ORDER — POTASSIUM CHLORIDE CRYS ER 20 MEQ PO TBCR
30.0000 meq | EXTENDED_RELEASE_TABLET | Freq: Once | ORAL | Status: AC
  Administered 2011-12-04: 30 meq via ORAL
  Filled 2011-12-04: qty 1

## 2011-12-04 NOTE — Progress Notes (Signed)
UR of chart completed.  

## 2011-12-04 NOTE — Progress Notes (Signed)
1 Day Post-Op  Subjective: Pt ok. Sore on (R)side. No N/V. Drinking clears ok.  Voiding well Hasn't been OOB much yet.  Objective: Vital signs in last 24 hours: Temp:  [97.9 F (36.6 C)-99 F (37.2 C)] 98.3 F (36.8 C) (01/17 0541) Pulse Rate:  [58-80] 58  (01/17 0541) Resp:  [16-20] 18  (01/17 0541) BP: (106-152)/(67-88) 106/69 mmHg (01/17 0541) SpO2:  [95 %-98 %] 95 % (01/17 0541) Last BM Date: 12/01/11  Intake/Output this shift:    Physical Exam: BP 106/69  Pulse 58  Temp(Src) 98.3 F (36.8 C) (Oral)  Resp 18  Ht 5\' 10"  (1.778 m)  Wt 99.791 kg (220 lb)  BMI 31.57 kg/m2  SpO2 95% Lungs: CTA without w/r/r Heart: Regular Abdomen: soft, ND, appropriately tender   Incisions all c/d/i without erythema or hematoma.   Drain intact, Serosanguinous output., no bile Ext: No edema or tenderness   Labs: CBC  Basename 12/04/11 0517 12/03/11 0455  WBC 10.6* 11.3*  HGB 12.9* 15.0  HCT 36.6* 41.1  PLT 188 189   BMET  Basename 12/04/11 0517 12/03/11 0455  NA 139 140  K 3.3* 3.5  CL 103 103  CO2 28 31  GLUCOSE 106* 127*  BUN 6 6  CREATININE 1.08 1.02  CALCIUM 8.4 8.7   LFT  Basename 12/04/11 0517 12/02/11 0504  PROT 6.3 --  ALBUMIN 3.0* --  AST 101* --  ALT 168* --  ALKPHOS 68 --  BILITOT 2.4* --  BILIDIR -- --  IBILI -- --  LIPASE -- 27   PT/INR No results found for this basename: LABPROT:2,INR:2 in the last 72 hours ABG No results found for this basename: PHART:2,PCO2:2,PO2:2,HCO3:2 in the last 72 hours  Studies/Results: Dg Cholangiogram Operative  12/03/2011  *RADIOLOGY REPORT*  Clinical Data:   Cholecystectomy.  Intraoperative cholangiogram.  INTRAOPERATIVE CHOLANGIOGRAM  Technique:  Cholangiographic images from the C-arm fluoroscopic device were submitted for interpretation post-operatively.  Please see the procedural report for the amount of contrast and the fluoroscopy time utilized.  Comparison:  CT abdomen and pelvis 12/02/2011.  Findings:  It  appears that the patient's cystic duct enters the right hepatic duct.  No biliary ductal dilatation is identified. There is clearly some air present on the injection.  At the junction of the right and left hepatic ducts, a persistent small lucency is identified which may represent a retained stone.  There is no leakage of contrast and contrast flows into the duodenum.  IMPRESSION: Small lucency at the junction of the right and left hepatic ducts may represent a retained biliary stone.  Findings were discussed with Dr. Andrey Campanile at the time interpretation.  Original Report Authenticated By: Bernadene Bell. Maricela Curet, M.D.   Ct Abdomen Pelvis W Contrast  12/02/2011  *RADIOLOGY REPORT*  Clinical Data: Epigastric pain with nausea and vomiting.  Elevated white blood count.  CT ABDOMEN AND PELVIS WITH CONTRAST  Technique:  Multidetector CT imaging of the abdomen and pelvis was performed following the standard protocol during bolus administration of intravenous contrast.  Contrast: OMNIPAQUE IOHEXOL 300 MG/ML IV SOLN  Comparison: Radiographs dated 12/02/2011  Findings: There are several cholesterol stones in the slightly prominent gallbladder.  Gallbladder wall is not thickened.  No dilated bile ducts.  Liver, spleen, pancreas, adrenal glands, and abdominal aorta are normal.  Multiple small benign appearing cysts on the left kidney the largest being exophytic and measuring 2.4 cm in diameter in the lower pole.  Single tiny cyst in the medial  aspect of the upper pole of the right kidney.  The bowel appears normal including the terminal ileum and appendix. No adenopathy. No acute osseous abnormality.  Mild arthritis of the hips.  IMPRESSION: Multiple cholesterol gallstones with slight distention of the gallbladder.  Otherwise benign-appearing abdomen and pelvis.  Original Report Authenticated By: Gwynn Burly, M.D.    Assessment: Active Problems:  Hypertension  Obesity (BMI 30-39.9) Hypokalemia Acute  cholecystits  Procedure(s): LAPAROSCOPIC CHOLECYSTECTOMY WITH INTRAOPERATIVE CHOLANGIOGRAM  Plan: Advance to low fat diet Continue antibiotics at least another 24hr. Recheck LFTs in am. Encouraged OOB/IS Replete K No chemical VTE due to intra-op bleeding.  LOS: 2 days    Marianna Fuss 12/04/2011

## 2011-12-04 NOTE — Progress Notes (Signed)
Doing ok. No n/v. Some pain. Drain serosang.  Bump in LFTs - not too surprised given surgery yesterday; however, total bili did go up. Need to repeat lft in am. If cont to go up will need gi consult.  Reginald [name]. Andrey Campanile, MD, FACS General, Bariatric, & Minimally Invasive Surgery [facility], [place]

## 2011-12-04 NOTE — Anesthesia Postprocedure Evaluation (Signed)
  Anesthesia Post-op Note  Patient: Reginald [name]  Procedure(s) Performed:  LAPAROSCOPIC CHOLECYSTECTOMY WITH INTRAOPERATIVE CHOLANGIOGRAM - laparoscopic cholecystectomy with intraoperative cholangiogram  Patient Location: PACU  Anesthesia Type: General  Level of Consciousness: alert   Airway and Oxygen Therapy: Patient connected to nasal cannula oxygen  Post-op Pain: 3 /10  Post-op Assessment: Post-op Vital signs reviewed  Post-op Vital Signs: stable  Complications: No apparent anesthesia complications

## 2011-12-05 LAB — CBC
Hemoglobin: 13.1 g/dL (ref 13.0–17.0)
MCHC: 35.3 g/dL (ref 30.0–36.0)
Platelets: 186 10*3/uL (ref 150–400)
RBC: 4.12 MIL/uL — ABNORMAL LOW (ref 4.22–5.81)

## 2011-12-05 LAB — BASIC METABOLIC PANEL
CO2: 30 mEq/L (ref 19–32)
Calcium: 8.7 mg/dL (ref 8.4–10.5)
Chloride: 105 mEq/L (ref 96–112)
Glucose, Bld: 102 mg/dL — ABNORMAL HIGH (ref 70–99)
Sodium: 141 mEq/L (ref 135–145)

## 2011-12-05 LAB — HEPATIC FUNCTION PANEL
ALT: 129 U/L — ABNORMAL HIGH (ref 0–53)
Albumin: 2.9 g/dL — ABNORMAL LOW (ref 3.5–5.2)
Alkaline Phosphatase: 74 U/L (ref 39–117)
Total Protein: 6.7 g/dL (ref 6.0–8.3)

## 2011-12-05 MED ORDER — PANTOPRAZOLE SODIUM 40 MG PO TBEC: 40.0000 mg | DELAYED_RELEASE_TABLET | Freq: Every day | ORAL | Status: DC

## 2011-12-05 MED ORDER — OXYCODONE-ACETAMINOPHEN 5-325 MG PO TABS: 1.0000 | ORAL_TABLET | ORAL | Status: AC | PRN

## 2011-12-05 NOTE — Progress Notes (Signed)
2 Days Post-Op  Subjective: Pt ok. Feels good, some soreness. No N/V Voiding well Passing flatus.  Objective: Vital signs in last 24 hours: Temp:  [98.2 F (36.8 C)-98.6 F (37 C)] 98.3 F (36.8 C) (01/18 0550) Pulse Rate:  [59-71] 71  (01/18 0550) Resp:  [18-20] 18  (01/18 0550) BP: (99-138)/(60-79) 138/79 mmHg (01/18 0550) SpO2:  [93 %-100 %] 96 % (01/18 0550) Last BM Date: 12/01/11  Intake/Output this shift:    Physical Exam: BP 138/79  Pulse 71  Temp(Src) 98.3 F (36.8 C) (Oral)  Resp 18  Ht 5\' 10"  (1.778 m)  Wt 99.791 kg (220 lb)  BMI 31.57 kg/m2  SpO2 96% Lungs: CTA without w/r/r Heart: Regular Abdomen: soft, ND, appropriately tender   Incisions all c/d/i without erythema or hematoma. Ext: No edema or tenderness   Labs: CBC  Basename 12/05/11 0530 12/04/11 0517  WBC 8.4 10.6*  HGB 13.1 12.9*  HCT 37.1* 36.6*  PLT 186 188   BMET  Basename 12/05/11 0530 12/04/11 0517  NA 141 139  K 3.6 3.3*  CL 105 103  CO2 30 28  GLUCOSE 102* 106*  BUN 6 6  CREATININE 1.09 1.08  CALCIUM 8.7 8.4   LFT  Basename 12/05/11 0530  PROT 6.7  ALBUMIN 2.9*  AST 57*  ALT 129*  ALKPHOS 74  BILITOT 1.4*  BILIDIR 0.4*  IBILI 1.0*  LIPASE --   PT/INR No results found for this basename: LABPROT:2,INR:2 in the last 72 hours ABG No results found for this basename: PHART:2,PCO2:2,PO2:2,HCO3:2 in the last 72 hours  Studies/Results: Dg Cholangiogram Operative  12/03/2011  *RADIOLOGY REPORT*  Clinical Data:   Cholecystectomy.  Intraoperative cholangiogram.  INTRAOPERATIVE CHOLANGIOGRAM  Technique:  Cholangiographic images from the C-arm fluoroscopic device were submitted for interpretation post-operatively.  Please see the procedural report for the amount of contrast and the fluoroscopy time utilized.  Comparison:  CT abdomen and pelvis 12/02/2011.  Findings:  It appears that the patient's cystic duct enters the right hepatic duct.  No biliary ductal dilatation is  identified. There is clearly some air present on the injection.  At the junction of the right and left hepatic ducts, a persistent small lucency is identified which may represent a retained stone.  There is no leakage of contrast and contrast flows into the duodenum.  IMPRESSION: Small lucency at the junction of the right and left hepatic ducts may represent a retained biliary stone.  Findings were discussed with Dr. Andrey Campanile at the time interpretation.  Original Report Authenticated By: Bernadene Bell. Maricela Curet, M.D.    Assessment: Active Problems:  Hypertension  Obesity (BMI 30-39.9)   Procedure(s): LAPAROSCOPIC CHOLECYSTECTOMY WITH INTRAOPERATIVE CHOLANGIOGRAM  Plan: WBC, LFTs better. Will DC home today with drain  LOS: 3 days    Marianna Fuss 12/05/2011

## 2011-12-05 NOTE — Discharge Summary (Signed)
Physician Discharge Summary  Patient ID: Eland Lamantia MRN: [id number] DOB/AGE: 47-Jun-1967 47 y.o.  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 He reports being normal state of health yesterday. He had dinner around 6 PM and did well until about 11 PM and Acute onset of pain. Reports pain and his midepigastric area just below the xiphoid. He also has severe pain in his right upper quadrant. This has waxed and waned but never completely resolved since onset at 28 PM yesterday. Onset of pain he had trouble with nausea and vomiting. He has not vomited since last night, before he came to the ER. He continues to have pain 4/10, even after IV Dilaudid. Workup in the ER shows a white count of 12,800 hemoglobin of 15.6 hematocrit of 42.4 platelets 236,000 potassium was 3.0 with remaining electrolytes normal. Total bilirubin 0.63m, alkaline phosphatase 66, AST 23, ALT 26, lipase 27. Acute abdomen was unremarkable. CT scan shows several stones in the gallbladder the gallbladder was not thickened no bile duct dilatation. The gallbladder shows mild distension. After multiple injections of Dilaudid, he continues to have pain and tenderness in the right upper quadrant.  After evaluation by Dr. Andrey Campanile, he was admitted on 1/15 and started on IV antibiotics. He was taken to the OR on 12/03/11 and underwent lap chole with IOC. See Op note for details. No obvious filling defect was noted. Post-operatively, the pt had a mild bump in his LFTs. However, they normalized. His diet was slowly advanced. He voided well on his own and his pain subsided by POD#2. He was tolerating diet, ambulating well.  His drain remained in place and no significant bloody or bilious output was noted. We feel he is stable for discharge at this point.  Consults: none   Discharge Exam: Blood pressure 138/79, pulse 71, temperature 98.3 F (36.8 C), temperature source Oral, resp. rate 18, height 5\' 10"  (1.778 m), weight 99.791 kg (220 lb), SpO2 96.00%. Lungs: CTA without w/r/r Heart: Regular Abdomen: soft, ND, appropriately tender   Incisions all c/d/i without erythema or hematoma.   Drain intact, serosanguinous output. Ext: No edema or tenderness   Disposition: To home.  Discharge Orders    Future Orders Please Complete By Expires   Diet - low sodium heart healthy      Increase activity slowly      May shower / Bathe      Discharge wound care:      Comments:   May remove band-aids. Leave steri-strips intact. Try to keep drain area dry if possible.   Call MD for:  redness, tenderness, or signs of infection (pain, swelling, redness, odor or green/yellow discharge around incision site)      Call MD for:  severe uncontrolled pain      Call MD for:  persistant nausea and vomiting      Call MD for:  temperature >100.4        Current Discharge Medication List    START taking these medications   Details  oxyCODONE-acetaminophen (PERCOCET) 5-325 MG per tablet Take 1-2 tablets by mouth every 4 (four) hours as needed. Qty: 30 tablet, Refills: 0      CONTINUE these medications which have NOT CHANGED   Details  hydrochlorothiazide (MICROZIDE) 12.5 MG capsule  Take 12.5 mg by mouth daily.       Follow-up Information    Follow up with Atilano Ina, MD. Schedule an appointment as soon as possible for a visit in 1 week.   Contact information:   [employer] [address] [place] [address] [phone]          Signed: Marianna Fuss 12/05/2011, 10:34 AM

## 2011-12-05 NOTE — Progress Notes (Signed)
D/C instructions given to wife and pt. Both verbalized understanding. Out via wheelchair with volunteers.

## 2011-12-06 NOTE — Discharge Summary (Signed)
Tineka Uriegas M. Carly Applegate, MD, FACS General, Bariatric, & Minimally Invasive Surgery [facility]  

## 2011-12-06 NOTE — Progress Notes (Signed)
Ean Gettel M. Yasseen Salls, MD, FACS General, Bariatric, & Minimally Invasive Surgery [facility]  

## 2011-12-08 ENCOUNTER — Telehealth (INDEPENDENT_AMBULATORY_CARE_PROVIDER_SITE_OTHER): Payer: Self-pay | Admitting: General Surgery

## 2011-12-08 NOTE — Telephone Encounter (Signed)
Pt had GB repair on 12/02/11 and needs a 1wk po, nothing available until 01/09/12, please call.

## 2011-12-08 NOTE — Telephone Encounter (Signed)
Appt made with patient.  

## 2011-12-09 ENCOUNTER — Telehealth (INDEPENDENT_AMBULATORY_CARE_PROVIDER_SITE_OTHER): Payer: Self-pay

## 2011-12-09 NOTE — Telephone Encounter (Signed)
Patient called and stated today he noticed a little pus on the bandage from the drain site.  There is minimal drainage in the drain.  He has no fever and no pain at the site.  He has been getting in the shower.  I told him to just watch this as it was the first occurrence.  I told him to continue to shower and gently wash the area and cover with a new dressing.  Call if worsens.  He is coming in 1/29.

## 2011-12-16 ENCOUNTER — Encounter (INDEPENDENT_AMBULATORY_CARE_PROVIDER_SITE_OTHER): Payer: Self-pay | Admitting: General Surgery

## 2011-12-16 ENCOUNTER — Ambulatory Visit (INDEPENDENT_AMBULATORY_CARE_PROVIDER_SITE_OTHER): Payer: BC Managed Care – PPO | Admitting: General Surgery

## 2011-12-16 VITALS — BP 130/82 | HR 71 | Ht 70.0 in | Wt 229.6 lb

## 2011-12-16 DIAGNOSIS — Z09 Encounter for follow-up examination after completed treatment for conditions other than malignant neoplasm: Secondary | ICD-10-CM

## 2011-12-16 NOTE — Progress Notes (Signed)
Chief complaint: Postop  Procedure: Status post laparoscopic cholecystectomy with intraoperative cholangiogram January 15  History of Present Ilness: 46 year old Caucasian male comes in today for his first postoperative appointment. He is admitted to the hospital on January 15 with acute cholecystitis as well as elevated liver function tests. His preoperative ultrasound did not demonstrate any common bile duct stones. He was taken to the operating room on January 15. He had a typical DOW gallbladder. I left a drain. His postoperative course was unremarkable. His LFTs trended downward. He was discharged home on the 18th.  He states that he has been doing overall okay. He denies any fevers. He states occasionally he might have a chill. He reports a normal appetite. He reports minimal pain. He is take one pain pill this past Sunday evening. He's had one episode of nausea and vomiting since discharge. He reports that his stools are still a little loose. He states that his drain is putting out minimal fluid and that it is red.  Physical Exam: BP 130/82  Pulse 71  Ht 5\' 10"  (1.778 m)  Wt 229 lb 9.6 oz (104.146 kg)  BMI 32.94 kg/m2  SpO2 97%  Gen: alert, NAD, non-toxic appearing Pupils: equal, no scleral icterus Pulm: Lungs clear to auscultation, symmetric chest rise CV: regular rate and rhythm Abd: soft, nontender, nondistended. Well-healed trocar sites. No cellulitis. No incisional hernia. Drain - serosangeous Ext: no edema, no calf tenderness Skin: no rash, no jaundice   Pathology: Acute and chronic cholecystitis with cholelithiasis  Assessment and Plan: Status post laparoscopic cholecystectomy with interoperative cholangiogram  We reviewed his pathology. We removed his surgical drain. There is no evidence of bile within the drain. Clinically he appears to doing well. I would like to see him back in 4-6 weeks to make sure that his loose stools have resolved. I will allow him to return  back to work on Monday.  Mary Sella. Andrey Campanile, MD, FACS General, Bariatric, & Minimally Invasive Surgery [facility], [place]

## 2011-12-18 ENCOUNTER — Encounter (INDEPENDENT_AMBULATORY_CARE_PROVIDER_SITE_OTHER): Payer: Self-pay | Admitting: General Surgery

## 2012-01-01 ENCOUNTER — Telehealth (INDEPENDENT_AMBULATORY_CARE_PROVIDER_SITE_OTHER): Payer: Self-pay | Admitting: General Surgery

## 2012-02-04 ENCOUNTER — Encounter (INDEPENDENT_AMBULATORY_CARE_PROVIDER_SITE_OTHER): Payer: BC Managed Care – PPO | Admitting: General Surgery

## 2014-12-18 ENCOUNTER — Emergency Department (HOSPITAL_COMMUNITY)
Admission: EM | Admit: 2014-12-18 | Discharge: 2014-12-18 | Disposition: A | Discharge status: Home / Self Care | Payer: 59 | Attending: Emergency Medicine | Admitting: Emergency Medicine

## 2014-12-18 ENCOUNTER — Emergency Department (HOSPITAL_COMMUNITY): Payer: 59

## 2014-12-18 DIAGNOSIS — I10 Essential (primary) hypertension: Secondary | ICD-10-CM | POA: Insufficient documentation

## 2014-12-18 DIAGNOSIS — J209 Acute bronchitis, unspecified: Secondary | ICD-10-CM | POA: Insufficient documentation

## 2014-12-18 DIAGNOSIS — R05 Cough: Secondary | ICD-10-CM | POA: Diagnosis present

## 2014-12-18 DIAGNOSIS — Z79899 Other long term (current) drug therapy: Secondary | ICD-10-CM | POA: Diagnosis not present

## 2014-12-18 LAB — CBC
HEMATOCRIT: 40.1 % (ref 39.0–52.0)
Hemoglobin: 14.3 g/dL (ref 13.0–17.0)
MCH: 30.8 pg (ref 26.0–34.0)
MCHC: 35.7 g/dL (ref 30.0–36.0)
MCV: 86.4 fL (ref 78.0–100.0)
Platelets: 202 10*3/uL (ref 150–400)
RBC: 4.64 MIL/uL (ref 4.22–5.81)
RDW: 12.2 % (ref 11.5–15.5)
WBC: 7.6 10*3/uL (ref 4.0–10.5)

## 2014-12-18 LAB — I-STAT TROPONIN, ED: TROPONIN I, POC: 0 ng/mL (ref 0.00–0.08)

## 2014-12-18 IMAGING — DX DG CHEST 2V
2 series · 2 of 2 positions shown · non-contrast
Comparison: None.

CLINICAL DATA: Cough and sore throat.  Chest pain

EXAM:
CHEST  2 VIEW

[chest pa]
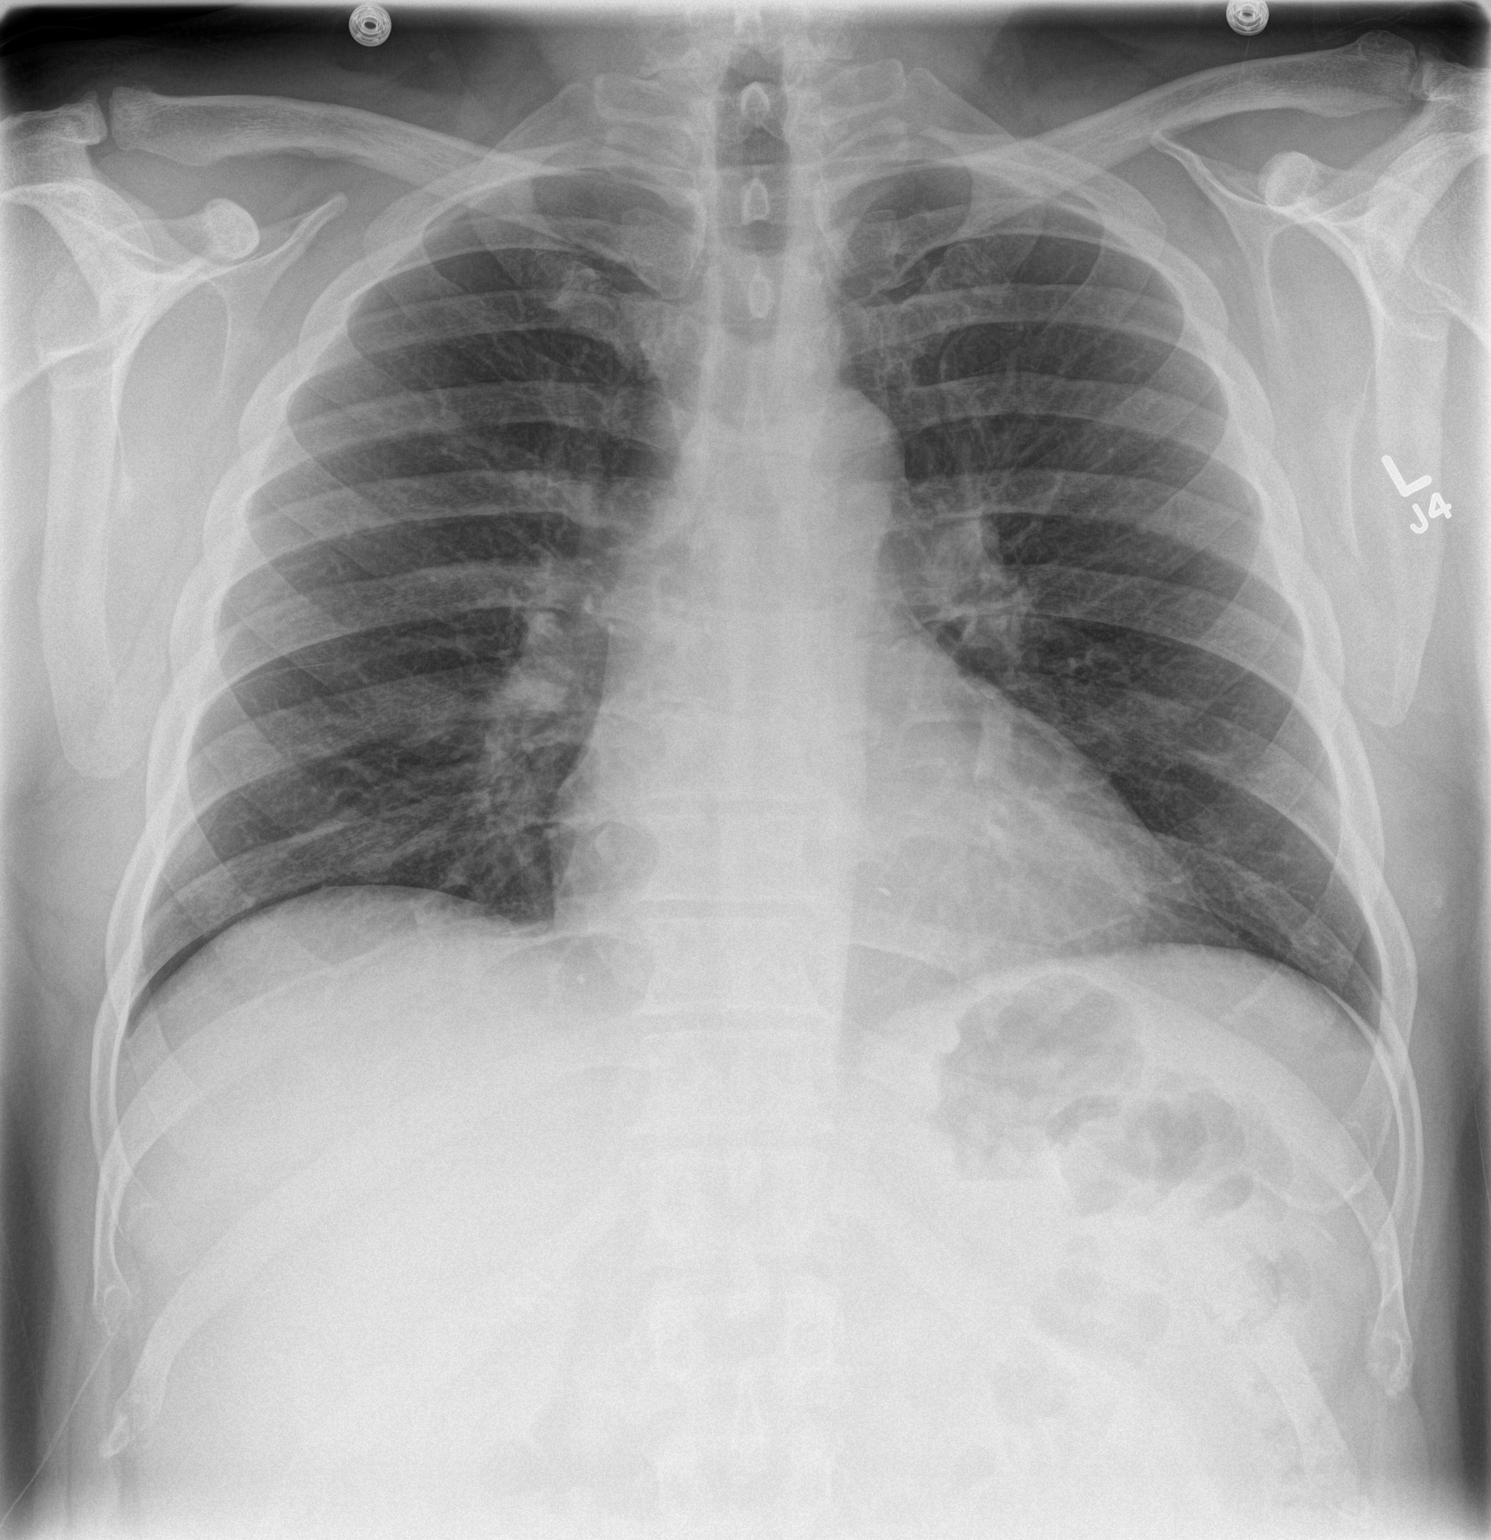

[chest lat]
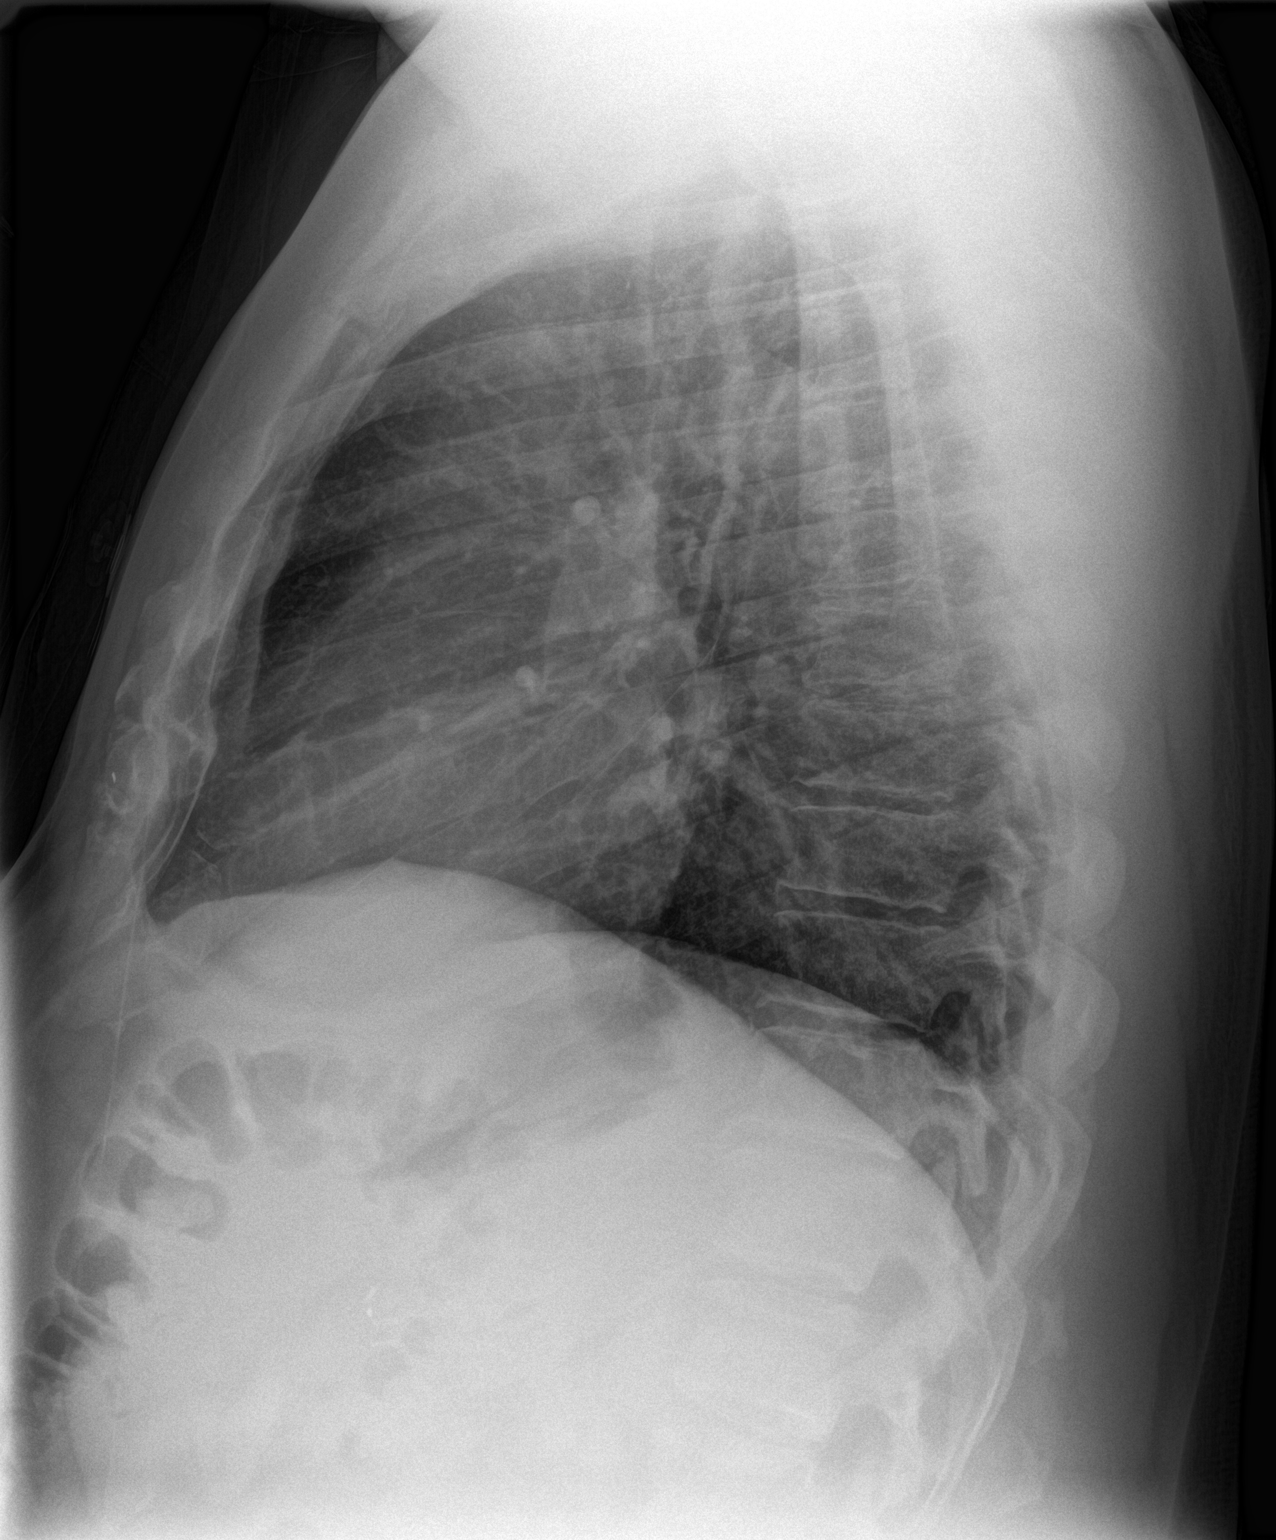

[2 of 2 positions shown; findings below may reference images not displayed]

FINDINGS: Normal heart size and mediastinal contours. No acute infiltrate or
edema. No effusion or pneumothorax. No acute osseous findings.
IMPRESSION: No active cardiopulmonary disease.

## 2014-12-18 MED ORDER — AEROCHAMBER Z-STAT PLUS/MEDIUM MISC
1.0000 | Freq: Once | Status: AC
  Administered 2014-12-18: 1
  Filled 2014-12-18: qty 1

## 2014-12-18 MED ORDER — PREDNISONE 20 MG PO TABS: 20.0000 mg | ORAL_TABLET | Freq: Two times a day (BID) | ORAL | Status: DC

## 2014-12-18 MED ORDER — IPRATROPIUM BROMIDE 0.02 % IN SOLN
0.5000 mg | Freq: Once | RESPIRATORY_TRACT | Status: AC
  Administered 2014-12-18: 0.5 mg via RESPIRATORY_TRACT
  Filled 2014-12-18: qty 2.5

## 2014-12-18 MED ORDER — PREDNISONE 20 MG PO TABS
60.0000 mg | ORAL_TABLET | Freq: Once | ORAL | Status: AC
  Administered 2014-12-18: 60 mg via ORAL
  Filled 2014-12-18: qty 3

## 2014-12-18 MED ORDER — ALBUTEROL SULFATE HFA 108 (90 BASE) MCG/ACT IN AERS
2.0000 | INHALATION_SPRAY | RESPIRATORY_TRACT | Status: DC | PRN
  Filled 2014-12-18: qty 6.7

## 2014-12-18 MED ORDER — ALBUTEROL SULFATE (2.5 MG/3ML) 0.083% IN NEBU
5.0000 mg | INHALATION_SOLUTION | Freq: Once | RESPIRATORY_TRACT | Status: AC
  Administered 2014-12-18: 5 mg via RESPIRATORY_TRACT
  Filled 2014-12-18: qty 6

## 2014-12-18 NOTE — Discharge Instructions (Signed)
Use the inhaler 2 puffs every 3-4 hours as needed for cough or trouble breathing. See your doctor as needed for problems.   Acute Bronchitis Bronchitis is inflammation of the airways that extend from the windpipe into the lungs (bronchi). The inflammation often causes mucus to develop. This leads to a cough, which is the most common symptom of bronchitis.  In acute bronchitis, the condition usually develops suddenly and goes away over time, usually in a couple weeks. Smoking, allergies, and asthma can make bronchitis worse. Repeated episodes of bronchitis may cause further lung problems.  CAUSES Acute bronchitis is most often caused by the same virus that causes a cold. The virus can spread from person to person (contagious) through coughing, sneezing, and touching contaminated objects. SIGNS AND SYMPTOMS   Cough.   Fever.   Coughing up mucus.   Body aches.   Chest congestion.   Chills.   Shortness of breath.   Sore throat.  DIAGNOSIS  Acute bronchitis is usually diagnosed through a physical exam. Your health care provider will also ask you questions about your medical history. Tests, such as chest X-rays, are sometimes done to rule out other conditions.  TREATMENT  Acute bronchitis usually goes away in a couple weeks. Oftentimes, no medical treatment is necessary. Medicines are sometimes given for relief of fever or cough. Antibiotic medicines are usually not needed but may be prescribed in certain situations. In some cases, an inhaler may be recommended to help reduce shortness of breath and control the cough. A cool mist vaporizer may also be used to help thin bronchial secretions and make it easier to clear the chest.  HOME CARE INSTRUCTIONS  Get plenty of rest.   Drink enough fluids to keep your urine clear or pale yellow (unless you have a medical condition that requires fluid restriction). Increasing fluids may help thin your respiratory secretions (sputum) and  reduce chest congestion, and it will prevent dehydration.   Take medicines only as directed by your health care provider.  If you were prescribed an antibiotic medicine, finish it all even if you start to feel better.  Avoid smoking and secondhand smoke. Exposure to cigarette smoke or irritating chemicals will make bronchitis worse. If you are a smoker, consider using nicotine gum or skin patches to help control withdrawal symptoms. Quitting smoking will help your lungs heal faster.   Reduce the chances of another bout of acute bronchitis by washing your hands frequently, avoiding people with cold symptoms, and trying not to touch your hands to your mouth, nose, or eyes.   Keep all follow-up visits as directed by your health care provider.  SEEK MEDICAL CARE IF: Your symptoms do not improve after 1 week of treatment.  SEEK IMMEDIATE MEDICAL CARE IF:  You develop an increased fever or chills.   You have chest pain.   You have severe shortness of breath.  You have bloody sputum.   You develop dehydration.  You faint or repeatedly feel like you are going to pass out.  You develop repeated vomiting.  You develop a severe headache. MAKE SURE YOU:   Understand these instructions.  Will watch your condition.  Will get help right away if you are not doing well or get worse. Document Released: 12/11/2004 Document Revised: 03/20/2014 Document Reviewed: 04/26/2013 [facility] Patient Information 2015 [employer], [place]. This information is not intended to replace advice given to you by your health care provider. Make sure you discuss any questions you have with your health care provider.

## 2014-12-18 NOTE — ED Notes (Signed)
Respiratory called for aerochamber.

## 2014-12-18 NOTE — ED Notes (Signed)
Patient in room putting on gown.  Tech Tonya aware of patient and will get vital signs and EKG.

## 2014-12-18 NOTE — ED Notes (Signed)
Chest pain and cough for about a week.  Describes as a pressure in center of chest worse with cough.  Also c/o nausea and vomiting with some sweating and dizziness.

## 2014-12-18 NOTE — ED Notes (Signed)
Pt tolerated breathing tx well and lungs are clear.

## 2014-12-18 NOTE — ED Provider Notes (Signed)
CSN: [id number]     Arrival date & time 12/18/14  0636 History   First MD Initiated Contact with Patient 12/18/14 [phone]     Chief Complaint  Patient presents with  . Cough  . Chest Pain     (Consider location/radiation/quality/duration/timing/severity/associated sxs/prior Treatment) HPI   Reginald [name] is a 49 y.o. male who presents for evaluation of cough.  The cough is progressive, and nonproductive.  He has developed chest soreness related to coughing.  This morning he had a coughing fit which resulted in vomiting as well as some bleeding from his nose.  He denies shortness of breath at rest.  The cough is worse at night.  He has never had this previously.  He is using a prescription cough medicine given by his doctor last week when he was diagnosed with "a virus".  Works as a Naval [employer].  Does not smoke.  There are no other known modifying factors.   Past Medical History  Diagnosis Date  . Hypertension   . Chills   . Abdominal pain   . Diarrhea   . Headache    Past Surgical History  Procedure Laterality Date  . Cholecystectomy  12/03/2011    Procedure: LAPAROSCOPIC CHOLECYSTECTOMY WITH INTRAOPERATIVE CHOLANGIOGRAM;  Surgeon: Atilano Ina, MD;  Location: [facility] OR;  Service: General;  Laterality: N/A;  laparoscopic cholecystectomy with intraoperative cholangiogram  . Spider bite      black widow or brown recluse   Family History  Problem Relation Age of Onset  . Cancer Mother     breast  . Heart disease Mother    History  Substance Use Topics  . Smoking status: Never Smoker   . Smokeless tobacco: Not on file  . Alcohol Use: 0.6 oz/week    1 Cans of beer per week    Review of Systems  All other systems reviewed and are negative.     Allergies  Ibuprofen  Home Medications   Prior to Admission medications   Medication Sig Start Date End Date Taking? Authorizing Provider  amLODipine (NORVASC) 10 MG tablet Take 10 mg by mouth daily.  12/12/14  Yes Historical  Provider, MD  promethazine-codeine (PHENERGAN WITH CODEINE) 6.25-10 MG/5ML syrup Take 10 mLs by mouth daily. Take for 10 days 12/12/14 12/21/14 Yes Historical Provider, MD   Pulse 92  Temp(Src) 98.1 F (36.7 C) (Oral)  Resp 19  Ht  (1.778 m)  Wt 240 lb (108.863 kg)  BMI 34.44 kg/m2  SpO2 99% Physical Exam  Constitutional: He is oriented to person, place, and time. He appears well-developed. No distress.  HENT:  Head: Normocephalic and atraumatic.  Right Ear: External ear normal.  Left Ear: External ear normal.  Eyes: Conjunctivae and EOM are normal. Pupils are equal, round, and reactive to light.  Neck: Normal range of motion and phonation normal. Neck supple.  Cardiovascular: Normal rate, regular rhythm and normal heart sounds.   Pulmonary/Chest: Effort normal and breath sounds normal. He exhibits no tenderness and no bony tenderness.  Shallow inspirations, no audible wheezes, rales or rhonchi.  Abdominal: Soft. There is no tenderness.  Musculoskeletal: Normal range of motion.  Neurological: He is alert and oriented to person, place, and time. No cranial nerve deficit or sensory deficit. He exhibits normal muscle tone. Coordination normal.  Skin: Skin is warm, dry and intact.  Psychiatric: He has a normal mood and affect. His behavior is normal. Judgment and thought content normal.  Nursing note and vitals reviewed.  ED Course  Procedures (including critical care time)  Medications  albuterol (PROVENTIL) (2.5 MG/3ML) 0.083% nebulizer solution 5 mg (not administered)  ipratropium (ATROVENT) nebulizer solution 0.5 mg (not administered)  predniSONE (DELTASONE) tablet 60 mg (not administered)    Patient Vitals for the past 24 hrs:  BP Temp Temp src Pulse Resp SpO2 Height Weight  12/18/14 0730 124/76 mmHg - - 69 12 98 % - -  12/18/14 0715 128/99 mmHg - - 71 23 99 % - -  12/18/14 0700 125/72 mmHg - - 70 18 97 % - -  12/18/14 0649 - 98.1 F (36.7 C) Oral 92 19 99 % 5\' 10"   (1.778 m) 240 lb (108.863 kg)    11:18 AM Reevaluation with update and discussion. After initial assessment and treatment, an updated evaluation reveals he feels better, has less coughing, and denies shortness of breath.  Findings discussed with patient, all questions answered. [name]    Labs Review Labs Reviewed - No data to display  Imaging Review No results found.   EKG Interpretation   Date/Time:  Monday December 18 2014 06:49:15 EST Ventricular Rate:  70 PR Interval:  129 QRS Duration: 101 QT Interval:  406 QTC Calculation: 438 R Axis:   59 Text Interpretation:  Sinus rhythm Minimal ST depression, inferior leads  When compared with ECG of 12/02/2011, No significant change was found  Confirmed by [facility] ComplexGLICK  MD, DAVID ([id number]) on 12/18/2014 6:59:33 AM      MDM   Final diagnoses:  Acute bronchitis, unspecified organism    Postviral bronchitis, unlikely to represent a bacterial infection.  He does not smoke.  His sputum production is primarily in the mornings.  Doubt sepsis, pneumonia, or metabolic instability.   Nursing Notes Reviewed/ Care Coordinated Applicable Imaging Reviewed Interpretation of Laboratory Data incorporated into ED treatment  The patient appears reasonably screened and/or stabilized for discharge and I doubt any other medical condition or other [facility] requiring further screening, evaluation, or treatment in the ED at this time prior to discharge.  Plan: Home Medications- Prednisone, Albuterol; Home Treatments- rest; return here if the recommended treatment, does not improve the symptoms; Recommended follow up- PCP 1 week   Flint MelterElliott L Iyani Dresner, MD 12/18/14 1121

## 2014-12-18 NOTE — ED Notes (Signed)
Pt is in stable condition upon d/c and ambulates independently from ED. Pt verbalizes understanding rt d/c instructions and medications. 

## 2014-12-18 NOTE — ED Notes (Signed)
Dr. Wentz at bedside. 

## 2015-06-18 ENCOUNTER — Ambulatory Visit
Admission: RE | Admit: 2015-06-18 | Discharge: 2015-06-18 | Disposition: A | Discharge status: Home / Self Care | Payer: PRIVATE HEALTH INSURANCE | Source: Ambulatory Visit | Attending: Family Medicine | Admitting: Family Medicine

## 2015-06-18 ENCOUNTER — Other Ambulatory Visit: Payer: Self-pay | Admitting: Family Medicine

## 2015-06-18 DIAGNOSIS — M25561 Pain in right knee: Secondary | ICD-10-CM

## 2015-06-18 IMAGING — CR DG KNEE 1-2V*R*
2 series · 2 of 2 positions shown · non-contrast
Comparison: None.

CLINICAL DATA: Right knee pain. Medial pain for 2 weeks. No known
trauma. Soft tissue lumps the medial knee, painful to flexion.

EXAM:
RIGHT KNEE - 1-2 VIEW

[w knee ap right]
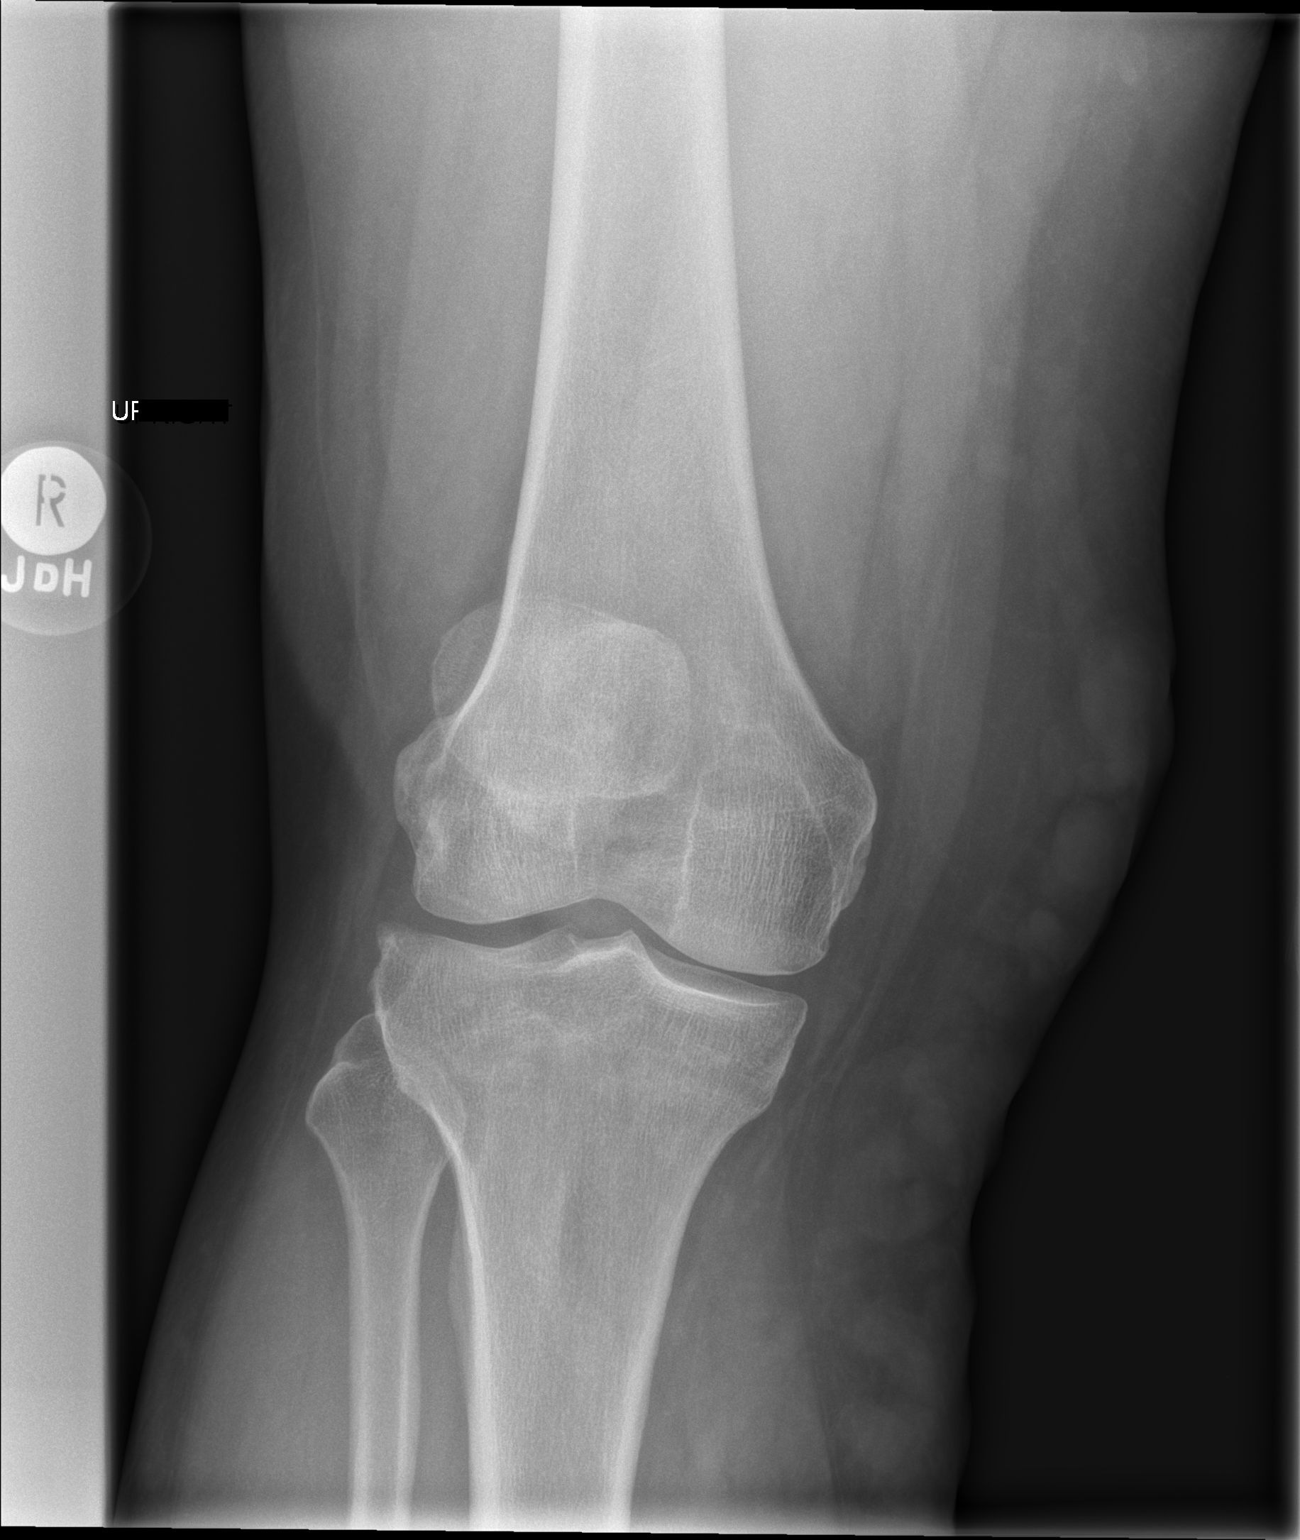

[w knee lat right]
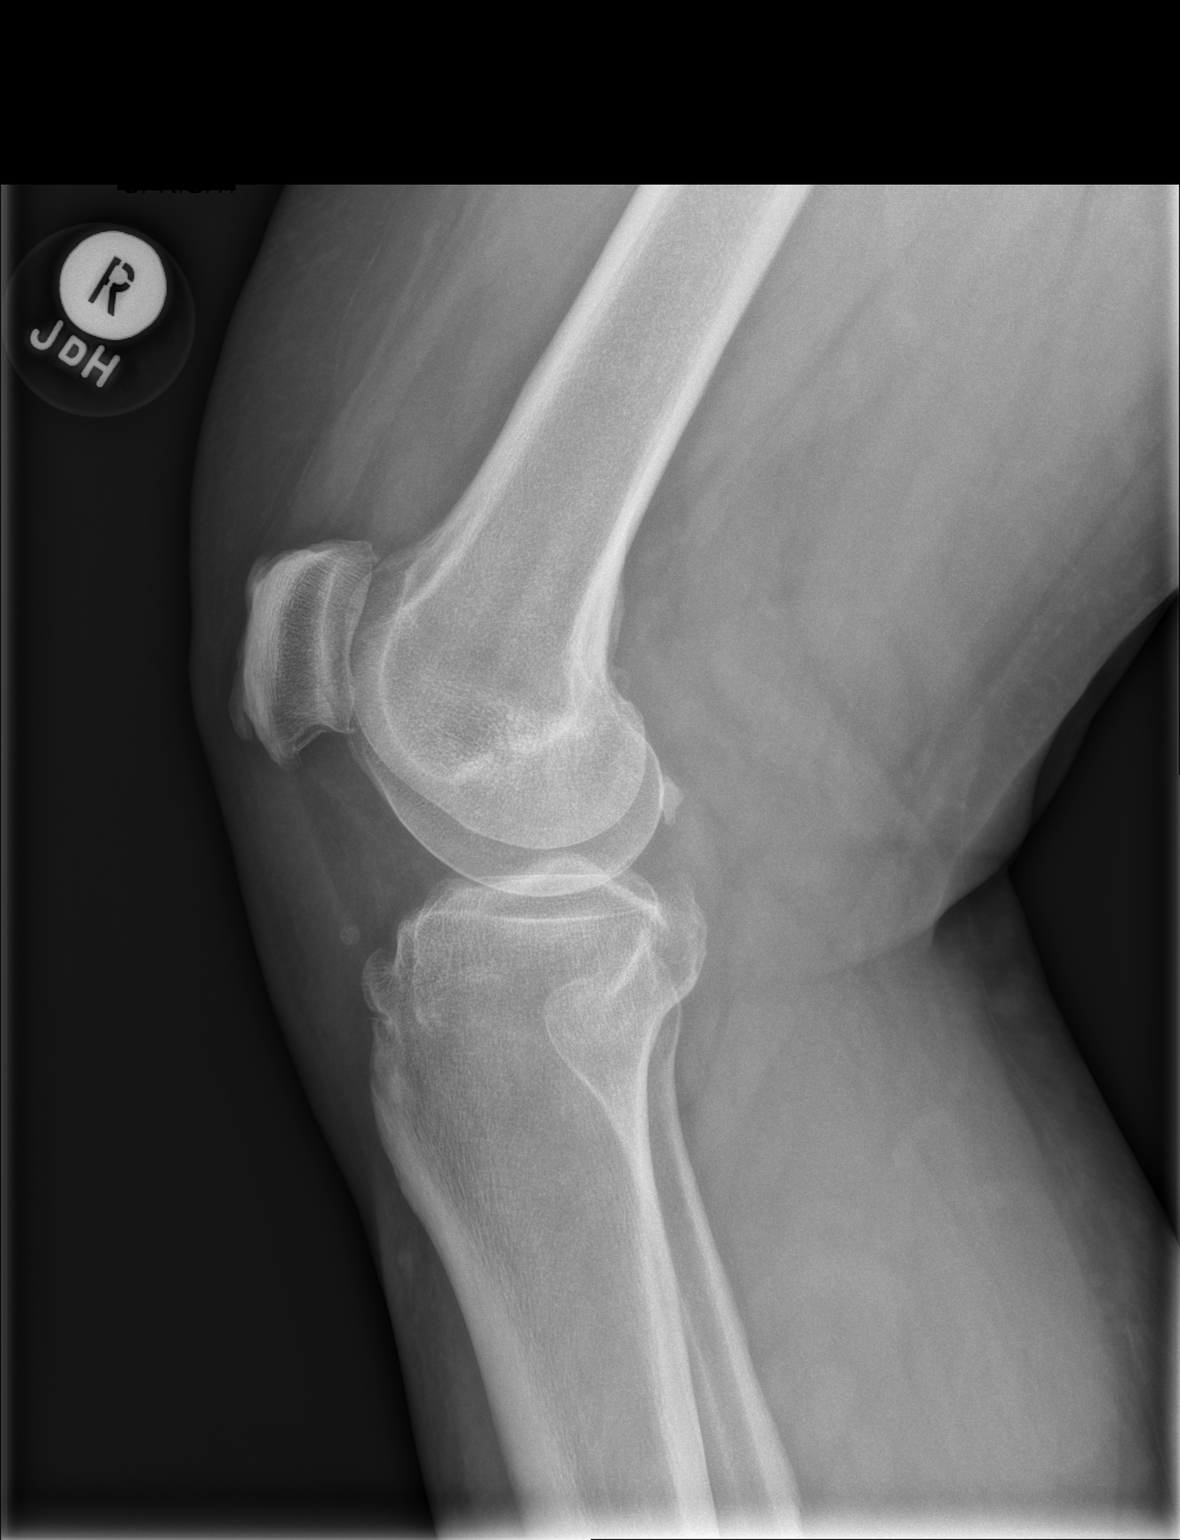

[2 of 2 positions shown; findings below may reference images not displayed]

FINDINGS: There is mild narrowing of the mediolateral compartments. Moderate
patellofemoral degenerative change is noted. There is a joint
effusion. No acute fracture or subluxation. Soft tissue nodularity
noted along the medial aspect of the knee raising question of
varicosities.
IMPRESSION: 1.  No evidence for acute  abnormality.
2. Joint effusion.
3. Soft tissue nodularity, possibly representing varicosities.

## 2015-09-12 ENCOUNTER — Encounter (HOSPITAL_BASED_OUTPATIENT_CLINIC_OR_DEPARTMENT_OTHER): Payer: Self-pay | Admitting: *Deleted

## 2015-09-17 ENCOUNTER — Encounter (HOSPITAL_BASED_OUTPATIENT_CLINIC_OR_DEPARTMENT_OTHER)
Admission: RE | Admit: 2015-09-17 | Discharge: 2015-09-17 | Disposition: A | Discharge status: Home / Self Care | Payer: 59 | Source: Ambulatory Visit | Attending: Orthopedic Surgery | Admitting: Orthopedic Surgery

## 2015-09-17 ENCOUNTER — Other Ambulatory Visit: Payer: Self-pay | Admitting: Orthopedic Surgery

## 2015-09-17 DIAGNOSIS — X58XXXA Exposure to other specified factors, initial encounter: Secondary | ICD-10-CM | POA: Diagnosis not present

## 2015-09-17 DIAGNOSIS — S83281A Other tear of lateral meniscus, current injury, right knee, initial encounter: Secondary | ICD-10-CM | POA: Diagnosis not present

## 2015-09-17 DIAGNOSIS — M1990 Unspecified osteoarthritis, unspecified site: Secondary | ICD-10-CM | POA: Diagnosis not present

## 2015-09-17 DIAGNOSIS — I10 Essential (primary) hypertension: Secondary | ICD-10-CM | POA: Diagnosis not present

## 2015-09-17 DIAGNOSIS — Z79899 Other long term (current) drug therapy: Secondary | ICD-10-CM | POA: Diagnosis not present

## 2015-09-17 DIAGNOSIS — M2241 Chondromalacia patellae, right knee: Secondary | ICD-10-CM | POA: Diagnosis not present

## 2015-09-17 DIAGNOSIS — S83241A Other tear of medial meniscus, current injury, right knee, initial encounter: Secondary | ICD-10-CM | POA: Diagnosis present

## 2015-09-17 LAB — BASIC METABOLIC PANEL
Anion gap: 12 (ref 5–15)
BUN: 13 mg/dL (ref 6–20)
CALCIUM: 9.8 mg/dL (ref 8.9–10.3)
CO2: 31 mmol/L (ref 22–32)
CREATININE: 1.17 mg/dL (ref 0.61–1.24)
Chloride: 97 mmol/L — ABNORMAL LOW (ref 101–111)
GFR calc Af Amer: >60 mL/min (ref >60)
Glucose, Bld: 117 mg/dL — ABNORMAL HIGH (ref 65–99)
Potassium: 3.4 mmol/L — ABNORMAL LOW (ref 3.5–5.1)
Sodium: 140 mmol/L (ref 135–145)

## 2015-09-19 ENCOUNTER — Encounter (HOSPITAL_BASED_OUTPATIENT_CLINIC_OR_DEPARTMENT_OTHER): Payer: Self-pay | Admitting: Certified Registered"

## 2015-09-19 ENCOUNTER — Ambulatory Visit (HOSPITAL_BASED_OUTPATIENT_CLINIC_OR_DEPARTMENT_OTHER)
Admission: RE | Admit: 2015-09-19 | Discharge: 2015-09-19 | Disposition: A | Discharge status: Home / Self Care | Payer: 59 | Source: Ambulatory Visit | Attending: Orthopedic Surgery | Admitting: Orthopedic Surgery

## 2015-09-19 ENCOUNTER — Ambulatory Visit (HOSPITAL_BASED_OUTPATIENT_CLINIC_OR_DEPARTMENT_OTHER): Payer: 59 | Admitting: Certified Registered"

## 2015-09-19 ENCOUNTER — Encounter (HOSPITAL_BASED_OUTPATIENT_CLINIC_OR_DEPARTMENT_OTHER)
Admission: RE | Disposition: A | Discharge status: Home / Self Care | Payer: Self-pay | Source: Ambulatory Visit | Attending: Orthopedic Surgery

## 2015-09-19 DIAGNOSIS — M2241 Chondromalacia patellae, right knee: Secondary | ICD-10-CM | POA: Insufficient documentation

## 2015-09-19 DIAGNOSIS — S83241A Other tear of medial meniscus, current injury, right knee, initial encounter: Secondary | ICD-10-CM | POA: Insufficient documentation

## 2015-09-19 DIAGNOSIS — Z79899 Other long term (current) drug therapy: Secondary | ICD-10-CM | POA: Insufficient documentation

## 2015-09-19 DIAGNOSIS — M1990 Unspecified osteoarthritis, unspecified site: Secondary | ICD-10-CM | POA: Insufficient documentation

## 2015-09-19 DIAGNOSIS — I10 Essential (primary) hypertension: Secondary | ICD-10-CM | POA: Insufficient documentation

## 2015-09-19 DIAGNOSIS — X58XXXA Exposure to other specified factors, initial encounter: Secondary | ICD-10-CM | POA: Insufficient documentation

## 2015-09-19 DIAGNOSIS — S83281A Other tear of lateral meniscus, current injury, right knee, initial encounter: Secondary | ICD-10-CM | POA: Insufficient documentation

## 2015-09-19 HISTORY — PX: KNEE ARTHROSCOPY WITH MEDIAL MENISECTOMY: SHX5651

## 2015-09-19 HISTORY — PX: KNEE ARTHROSCOPY WITH LATERAL MENISECTOMY: SHX6193

## 2015-09-19 HISTORY — PX: CHONDROPLASTY: SHX5177

## 2015-09-19 HISTORY — DX: Unspecified osteoarthritis, unspecified site: M19.90

## 2015-09-19 SURGERY — ARTHROSCOPY, KNEE, WITH MEDIAL MENISCECTOMY
Anesthesia: General | Site: Knee | Laterality: Right

## 2015-09-19 MED ORDER — FENTANYL CITRATE (PF) 100 MCG/2ML IJ SOLN
INTRAMUSCULAR | Status: AC
  Filled 2015-09-19: qty 4

## 2015-09-19 MED ORDER — OXYCODONE-ACETAMINOPHEN 5-325 MG PO TABS
ORAL_TABLET | ORAL | Status: AC
  Filled 2015-09-19: qty 1

## 2015-09-19 MED ORDER — ONDANSETRON HCL 4 MG/2ML IJ SOLN
INTRAMUSCULAR | Status: DC | PRN
  Administered 2015-09-19: 4 mg via INTRAVENOUS

## 2015-09-19 MED ORDER — OXYCODONE-ACETAMINOPHEN 5-325 MG PO TABS
1.0000 | ORAL_TABLET | Freq: Once | ORAL | Status: AC | PRN
  Administered 2015-09-19: 1 via ORAL

## 2015-09-19 MED ORDER — LIDOCAINE HCL (CARDIAC) 20 MG/ML IV SOLN
INTRAVENOUS | Status: DC | PRN
  Administered 2015-09-19: 80 mg via INTRAVENOUS

## 2015-09-19 MED ORDER — GLYCOPYRROLATE 0.2 MG/ML IJ SOLN: 0.2000 mg | Freq: Once | INTRAMUSCULAR | Status: DC | PRN

## 2015-09-19 MED ORDER — PROMETHAZINE HCL 25 MG/ML IJ SOLN
6.2500 mg | INTRAMUSCULAR | Status: DC | PRN
Start: 2015-09-19 — End: 2015-09-19

## 2015-09-19 MED ORDER — CHLORHEXIDINE GLUCONATE 4 % EX LIQD: 60.0000 mL | Freq: Once | CUTANEOUS | Status: DC

## 2015-09-19 MED ORDER — DEXAMETHASONE SODIUM PHOSPHATE 10 MG/ML IJ SOLN
INTRAMUSCULAR | Status: AC
  Filled 2015-09-19: qty 1

## 2015-09-19 MED ORDER — CEFAZOLIN SODIUM-DEXTROSE 2-3 GM-% IV SOLR
INTRAVENOUS | Status: AC
  Filled 2015-09-19: qty 50

## 2015-09-19 MED ORDER — ONDANSETRON HCL 4 MG/2ML IJ SOLN
INTRAMUSCULAR | Status: AC
  Filled 2015-09-19: qty 2

## 2015-09-19 MED ORDER — FENTANYL CITRATE (PF) 100 MCG/2ML IJ SOLN
50.0000 ug | INTRAMUSCULAR | Status: DC | PRN
  Administered 2015-09-19: 50 ug via INTRAVENOUS
  Administered 2015-09-19: 100 ug via INTRAVENOUS

## 2015-09-19 MED ORDER — BUPIVACAINE HCL (PF) 0.5 % IJ SOLN
INTRAMUSCULAR | Status: DC | PRN
  Administered 2015-09-19: 20 mL

## 2015-09-19 MED ORDER — OXYCODONE-ACETAMINOPHEN 5-325 MG PO TABS: 1.0000 | ORAL_TABLET | Freq: Four times a day (QID) | ORAL | Status: DC | PRN

## 2015-09-19 MED ORDER — DEXAMETHASONE SODIUM PHOSPHATE 4 MG/ML IJ SOLN
INTRAMUSCULAR | Status: DC | PRN
  Administered 2015-09-19: 10 mg via INTRAVENOUS

## 2015-09-19 MED ORDER — HYDROMORPHONE HCL 1 MG/ML IJ SOLN: 0.2500 mg | INTRAMUSCULAR | Status: DC | PRN

## 2015-09-19 MED ORDER — PROPOFOL 10 MG/ML IV BOLUS
INTRAVENOUS | Status: DC | PRN
  Administered 2015-09-19: 30 mg via INTRAVENOUS
  Administered 2015-09-19: 200 mg via INTRAVENOUS

## 2015-09-19 MED ORDER — MIDAZOLAM HCL 2 MG/2ML IJ SOLN
1.0000 mg | INTRAMUSCULAR | Status: DC | PRN
  Administered 2015-09-19: 2 mg via INTRAVENOUS

## 2015-09-19 MED ORDER — LIDOCAINE HCL (CARDIAC) 20 MG/ML IV SOLN
INTRAVENOUS | Status: AC
  Filled 2015-09-19: qty 5

## 2015-09-19 MED ORDER — LACTATED RINGERS IV SOLN: INTRAVENOUS | Status: DC

## 2015-09-19 MED ORDER — MEPERIDINE HCL 25 MG/ML IJ SOLN: 6.2500 mg | INTRAMUSCULAR | Status: DC | PRN

## 2015-09-19 MED ORDER — MIDAZOLAM HCL 2 MG/2ML IJ SOLN
INTRAMUSCULAR | Status: AC
  Filled 2015-09-19: qty 4

## 2015-09-19 MED ORDER — SODIUM CHLORIDE 0.9 % IR SOLN
Status: DC | PRN
  Administered 2015-09-19: 3000 mL

## 2015-09-19 MED ORDER — LACTATED RINGERS IV SOLN
INTRAVENOUS | Status: DC
  Administered 2015-09-19 (×2): via INTRAVENOUS

## 2015-09-19 MED ORDER — CEFAZOLIN SODIUM-DEXTROSE 2-3 GM-% IV SOLR
2.0000 g | INTRAVENOUS | Status: AC
  Administered 2015-09-19: 2 g via INTRAVENOUS

## 2015-09-19 MED ORDER — SCOPOLAMINE 1 MG/3DAYS TD PT72: 1.0000 | MEDICATED_PATCH | Freq: Once | TRANSDERMAL | Status: DC | PRN

## 2015-09-19 SURGICAL SUPPLY — 41 items
BANDAGE ELASTIC 6 VELCRO ST LF (GAUZE/BANDAGES/DRESSINGS) ×3 IMPLANT
BLADE 4.2CUDA (BLADE) IMPLANT
BLADE GREAT WHITE 4.2 (BLADE) ×2 IMPLANT
BLADE GREAT WHITE 4.2MM (BLADE) ×1
CUTTER MENISCUS  4.2MM (BLADE)
CUTTER MENISCUS 4.2MM (BLADE) IMPLANT
DRAPE ARTHROSCOPY W/POUCH 114 (DRAPES) ×3 IMPLANT
DRSG EMULSION OIL 3X3 NADH (GAUZE/BANDAGES/DRESSINGS) ×3 IMPLANT
DURAPREP 26ML APPLICATOR (WOUND CARE) ×3 IMPLANT
ELECT MENISCUS 165MM 90D (ELECTRODE) IMPLANT
ELECT REM PT RETURN 9FT ADLT (ELECTROSURGICAL)
ELECTRODE REM PT RTRN 9FT ADLT (ELECTROSURGICAL) IMPLANT
GAUZE SPONGE 4X4 12PLY STRL (GAUZE/BANDAGES/DRESSINGS) ×3 IMPLANT
GLOVE BIO SURGEON STRL SZ 6.5 (GLOVE) ×2 IMPLANT
GLOVE BIO SURGEONS STRL SZ 6.5 (GLOVE) ×1
GLOVE BIOGEL PI IND STRL 7.0 (GLOVE) ×2 IMPLANT
GLOVE BIOGEL PI IND STRL 8 (GLOVE) ×2 IMPLANT
GLOVE BIOGEL PI INDICATOR 7.0 (GLOVE) ×4
GLOVE BIOGEL PI INDICATOR 8 (GLOVE) ×4
GLOVE ECLIPSE 7.5 STRL STRAW (GLOVE) ×6 IMPLANT
GOWN STRL REUS W/ TWL LRG LVL3 (GOWN DISPOSABLE) ×1 IMPLANT
GOWN STRL REUS W/ TWL XL LVL3 (GOWN DISPOSABLE) ×1 IMPLANT
GOWN STRL REUS W/TWL LRG LVL3 (GOWN DISPOSABLE) ×2
GOWN STRL REUS W/TWL XL LVL3 (GOWN DISPOSABLE) ×5 IMPLANT
HOLDER KNEE FOAM BLUE (MISCELLANEOUS) ×3 IMPLANT
KNEE WRAP E Z 3 GEL PACK (MISCELLANEOUS) ×3 IMPLANT
MANIFOLD NEPTUNE II (INSTRUMENTS) ×3 IMPLANT
NDL SAFETY ECLIPSE 18X1.5 (NEEDLE) IMPLANT
NEEDLE HYPO 18GX1.5 SHARP (NEEDLE)
PACK ARTHROSCOPY DSU (CUSTOM PROCEDURE TRAY) ×3 IMPLANT
PACK BASIN DAY SURGERY FS (CUSTOM PROCEDURE TRAY) ×3 IMPLANT
PAD CAST 4YDX4 CTTN HI CHSV (CAST SUPPLIES) ×1 IMPLANT
PADDING CAST COTTON 4X4 STRL (CAST SUPPLIES) ×2
PENCIL BUTTON HOLSTER BLD 10FT (ELECTRODE) IMPLANT
SET ARTHROSCOPY TUBING (MISCELLANEOUS) ×2
SET ARTHROSCOPY TUBING LN (MISCELLANEOUS) ×1 IMPLANT
SUT ETHILON 4 0 PS 2 18 (SUTURE) IMPLANT
SYR 5ML LL (SYRINGE) IMPLANT
TOWEL OR 17X24 6PK STRL BLUE (TOWEL DISPOSABLE) ×3 IMPLANT
TOWEL OR NON WOVEN STRL DISP B (DISPOSABLE) ×3 IMPLANT
WATER STERILE IRR 1000ML POUR (IV SOLUTION) ×3 IMPLANT

## 2015-09-19 NOTE — Anesthesia Postprocedure Evaluation (Signed)
  Anesthesia Post-op Note  Patient: Reginald [name]  Procedure(s) Performed: Procedure(s): KNEE ARTHROSCOPY WITH PARTIAL MEDIAL MENISECTOMY (Right) KNEE ARTHROSCOPY WITH PARTIAL LATERAL MENISECTOMY (Right) CHONDROPLASTY (Right)  Patient Location: PACU  Anesthesia Type:General  Level of Consciousness: awake, alert  and oriented  Airway and Oxygen Therapy: Patient Spontanous Breathing  Post-op Pain: mild  Post-op Assessment: Post-op Vital signs reviewed and Patient's Cardiovascular Status Stable     RLE Motor Response: Purposeful movement RLE Sensation: Full sensation      Post-op Vital Signs: Reviewed and stable  Last Vitals:  Filed Vitals:   09/19/15 1206  BP: 150/86  Pulse: 70  Temp: 36.4 C  Resp: 18    Complications: No apparent anesthesia complications

## 2015-09-19 NOTE — Brief Op Note (Signed)
09/19/2015  10:41 AM  PATIENT:  Reginald [name]  49 y.o. male  PRE-OPERATIVE DIAGNOSIS:  MEDIAL MENISCUS TEAR RIGHT KNEE  POST-OPERATIVE DIAGNOSIS:  * No post-op diagnosis entered *  PROCEDURE:  Procedure(s): KNEE ARTHROSCOPY WITH PARTIAL MEDIAL MENISECTOMY (Right) KNEE ARTHROSCOPY WITH PARTIAL LATERAL MENISECTOMY (Right) CHONDROPLASTY (Right)  SURGEON:  Surgeon(s) and Role:    * Jodi GeraldsJohn Daelen Belvedere, MD - Primary  PHYSICIAN ASSISTANT:   ASSISTANTS: bethune   ANESTHESIA:   general  EBL:  Total I/O In: 1000 [I.V.:1000] Out: -   BLOOD ADMINISTERED:none  DRAINS: none   LOCAL MEDICATIONS USED:  MARCAINE     SPECIMEN:  No Specimen  DISPOSITION OF SPECIMEN:  N/A  COUNTS:  YES  TOURNIQUET:  * No tourniquets in log *  DICTATION: .Other Dictation: Dictation Number [id number]  PLAN OF CARE: Discharge to home after PACU  PATIENT DISPOSITION:  PACU - hemodynamically stable.   Delay start of Pharmacological VTE agent (>24hrs) due to surgical blood loss or risk of bleeding: no

## 2015-09-19 NOTE — Anesthesia Procedure Notes (Signed)
Procedure Name: LMA Insertion Date/Time: 09/19/2015 10:15 AM Performed by: Curly ShoresRAFT, Shantese Raven W Pre-anesthesia Checklist: Patient identified, Emergency Drugs available, Suction available and Patient being monitored Patient Re-evaluated:Patient Re-evaluated prior to inductionOxygen Delivery Method: Circle System Utilized Preoxygenation: Pre-oxygenation with 100% oxygen Intubation Type: IV induction Ventilation: Mask ventilation without difficulty LMA: LMA inserted LMA Size: 5.0 Number of attempts: 1 Airway Equipment and Method: Bite block Placement Confirmation: positive ETCO2 and breath sounds checked- equal and bilateral Tube secured with: Tape Dental Injury: Teeth and Oropharynx as per pre-operative assessment

## 2015-09-19 NOTE — Discharge Instructions (Signed)
POST-OP KNEE ARTHROSCOPY INSTRUCTIONS  °Dr. John Graves/Jim Bethune PA-C ° °Pain °You will be expected to have a moderate amount of pain in the affected knee for approximately two weeks. However, the first two days will be the most severe pain. A prescription has been provided to take as needed for the pain. The pain can be reduced by applying ice packs to the knee for the first 1-2 weeks post surgery. Also, keeping the leg elevated on pillows will help alleviate the pain. If you develop any acute pain or swelling in your calf muscle, please call the doctor. ° °Activity °It is preferred that you stay at bed rest for approximately 24 hours. However, you may go to the bathroom with help. Weight bearing as tolerated. You may begin the knee exercises the day of surgery. Discontinue crutches as the knee pain resolves. ° °Dressing °Keep the dressing dry. If the ace bandage should wrinkle or roll up, this can be rewrapped to prevent ridges in the bandage. You may remove all dressings in 48 hours,  apply bandaids to each wound. You may shower on the 4th day after surgery but no tub bath. ° °Symptoms to report to your doctor °Extreme pain °Extreme swelling °Temperature above 101 degrees °Change in the feeling, color, or movement of your toes °Redness, heat, or swelling at your incision ° °Exercise °If is preferred that as soon as possible you try to do a straight leg raise without bending the knee and concentrate on bringing the heel of your foot off the bed up to approximately 45 degrees and hold for the count of 10 seconds. Repeat this at least 10 times three or four times per day. Additional exercises are provided below. ° °You are encouraged to bend the knee as tolerated. ° °Follow-Up °Call to schedule a follow-up appointment in 5-7 days. Our office # is [phone]. ° °POST-OP EXERCISES ° °Short Arc Quads ° °1. Lie on back with legs straight. Place towel roll under thigh, just above knee. °2. Tighten thigh muscles to  straighten knee and lift heel off bed. °3. Hold for slow count of five, then lower. °4. Do three sets of ten ° ° ° °Straight Leg Raises ° °1. Lie on back with operative leg straight and other leg bent. °2. Keeping operative leg completely straight, slowly lift operative leg so foot is 5 inches off bed. °3. Hold for slow count of five, then lower. °4. Do three sets of ten. ° ° ° °DO BOTH EXERCISES 2 TIMES A DAY ° °Ankle Pumps ° °Work/move the operative ankle and foot up and down 10 times every hour while awake. ° ° °Post Anesthesia Home Care Instructions ° °Activity: °Get plenty of rest for the remainder of the day. A responsible adult should stay with you for 24 hours following the procedure.  °For the next 24 hours, DO NOT: °-Drive a car °-Operate machinery °-Drink alcoholic beverages °-Take any medication unless instructed by your physician °-Make any legal decisions or sign important papers. ° °Meals: °Start with liquid foods such as gelatin or soup. Progress to regular foods as tolerated. Avoid greasy, spicy, heavy foods. If nausea and/or vomiting occur, drink only clear liquids until the nausea and/or vomiting subsides. Call your physician if vomiting continues. ° °Special Instructions/Symptoms: °Your throat may feel dry or sore from the anesthesia or the breathing tube placed in your throat during surgery. If this causes discomfort, gargle with warm salt water. The discomfort should disappear within 24 hours. ° °If you   had a scopolamine patch placed behind your ear for the management of post- operative nausea and/or vomiting: ° °1. The medication in the patch is effective for 72 hours, after which it should be removed.  Wrap patch in a tissue and discard in the trash. Wash hands thoroughly with soap and water. °2. You may remove the patch earlier than 72 hours if you experience unpleasant side effects which may include dry mouth, dizziness or visual disturbances. °3. Avoid touching the patch. Wash your hands  with soap and water after contact with the patch. °  ° °

## 2015-09-19 NOTE — H&P (Addendum)
  PREOPERATIVE H&P  Chief Complaint: right knee pain  HPI: Reginald [name] is a 49 y.o. male who presents for evaluation of right knee pain. It has been present for greater than 3 months and has been worsening. He has failed conservative measures. Pain is rated as moderate.  Past Medical History  Diagnosis Date  . Hypertension   . Headache(784.0)   . Arthritis    Past Surgical History  Procedure Laterality Date  . Cholecystectomy  12/03/2011    Procedure: LAPAROSCOPIC CHOLECYSTECTOMY WITH INTRAOPERATIVE CHOLANGIOGRAM;  Surgeon: Atilano InaEric M Wilson, MD;  [facility];  Service: General;  Laterality: N/A;  laparoscopic cholecystectomy with intraoperative cholangiogram  . Spider bite      black widow or brown recluse   Social History   Social History  . Marital Status: Married    Spouse Name: N/A  . Number of Children: N/A  . Years of Education: N/A   Social History Main Topics  . Smoking status: Never Smoker   . Smokeless tobacco: None  . Alcohol Use: 0.6 oz/week    1 Cans of beer per week  . Drug Use: No  . Sexual Activity: Not Asked   Other Topics Concern  . None   Social History Narrative   Family History  Problem Relation Age of Onset  . Cancer Mother     breast  . Heart disease Mother    Allergies  Allergen Reactions  . Ibuprofen Nausea And Vomiting    High doses make him vomit.   Prior to Admission medications   Medication Sig Start Date End Date Taking? Authorizing Provider  amLODipine (NORVASC) 10 MG tablet Take 10 mg by mouth daily.  12/12/14   Historical Provider, MD     Positive ROS: none  All other systems have been reviewed and were otherwise negative with the exception of those mentioned in the HPI and as above.  Physical Exam: Filed Vitals:   09/19/15 0904  BP: 144/87  Pulse: 67  Temp: 98.2 F (36.8 C)  Resp: 20    General: Alert, no acute distress Cardiovascular: No pedal edema Respiratory: No cyanosis, no use of accessory  musculature GI: No organomegaly, abdomen is soft and non-tender Skin: No lesions in the area of chief complaint Neurologic: Sensation intact distally Psychiatric: Patient is competent for consent with normal mood and affect Lymphatic: No axillary or cervical lymphadenopathy  MUSCULOSKELETAL: right knee: Tender to palpation over the medial joint line.  Positive McMurray.  Pain to range of motion.  Trace effusion.  No instability.  Assessment/Plan: MEDIAL MENISCUS TEAR RIGHT KNEE Plan for Procedure(s): RIGHT KNEE ARTHROSCOPY   The risks benefits and alternatives were discussed with the patient including but not limited to the risks of nonoperative treatment, versus surgical intervention including infection, bleeding, nerve injury, malunion, nonunion, hardware prominence, hardware failure, need for hardware removal, blood clots, cardiopulmonary complications, morbidity, mortality, among others, and they were willing to proceed.  Predicted outcome is good, although there will be at least a six to nine month expected recovery.  [name] L, MD 09/19/2015 10:08 AM

## 2015-09-19 NOTE — Anesthesia Preprocedure Evaluation (Addendum)
Anesthesia Evaluation  Patient identified by MRN, date of birth, ID band Patient awake    Reviewed: Allergy & Precautions, NPO status , Patient's Chart, lab work & pertinent test results  Airway Mallampati: II  TM Distance: >3 FB Neck ROM: Full    Dental  (+) Teeth Intact   Pulmonary neg pulmonary ROS,    breath sounds clear to auscultation       Cardiovascular hypertension, Pt. on medications  Rhythm:Regular Rate:Normal     Neuro/Psych  Headaches, negative psych ROS   GI/Hepatic negative GI ROS, Neg liver ROS,   Endo/Other  negative endocrine ROS  Renal/GU negative Renal ROS  negative genitourinary   Musculoskeletal  (+) Arthritis ,   Abdominal   Peds negative pediatric ROS (+)  Hematology negative hematology ROS (+)   Anesthesia Other Findings   Reproductive/Obstetrics negative OB ROS                            Lab Results  Component Value Date   WBC 7.6 12/18/2014   HGB 14.3 12/18/2014   HCT 40.1 12/18/2014   MCV 86.4 12/18/2014   PLT 202 12/18/2014   Lab Results  Component Value Date   CREATININE 1.17 09/17/2015   BUN 13 09/17/2015   NA 140 09/17/2015   K 3.4* 09/17/2015   CL 97* 09/17/2015   CO2 31 09/17/2015   No results found for: INR, PROTIME  EKG: normal sinus rhythm.  Anesthesia Physical Anesthesia Plan  ASA: II  Anesthesia Plan: General   Post-op Pain Management:    Induction: Intravenous  Airway Management Planned: LMA  Additional Equipment:   Intra-op Plan:   Post-operative Plan: Extubation in OR  Informed Consent: I have reviewed the patients History and Physical, chart, labs and discussed the procedure including the risks, benefits and alternatives for the proposed anesthesia with the patient or authorized representative who has indicated his/her understanding and acceptance.   Dental advisory given  Plan Discussed with: CRNA  Anesthesia  Plan Comments:         Anesthesia Quick Evaluation

## 2015-09-19 NOTE — Transfer of Care (Signed)
Immediate Anesthesia Transfer of Care Note  Patient: Reginald [name]  Procedure(s) Performed: Procedure(s): KNEE ARTHROSCOPY WITH PARTIAL MEDIAL MENISECTOMY (Right) KNEE ARTHROSCOPY WITH PARTIAL LATERAL MENISECTOMY (Right) CHONDROPLASTY (Right)  Patient Location: PACU  Anesthesia Type:General  Level of Consciousness: awake, sedated and responds to stimulation  Airway & Oxygen Therapy: Patient Spontanous Breathing and Patient connected to face mask oxygen  Post-op Assessment: Report given to RN, Post -op Vital signs reviewed and stable and Patient moving all extremities  Post vital signs: Reviewed and stable  Last Vitals:  Filed Vitals:   09/19/15 0904  BP: 144/87  Pulse: 67  Temp: 36.8 C  Resp: 20    Complications: No apparent anesthesia complications

## 2015-09-20 ENCOUNTER — Encounter (HOSPITAL_BASED_OUTPATIENT_CLINIC_OR_DEPARTMENT_OTHER): Payer: Self-pay | Admitting: Orthopedic Surgery

## 2015-09-20 NOTE — Op Note (Signed)
NAMBunnie Pion:  [name], Reginald               ACCOUNT NO.:  [id number]  MEDICAL RECORD NO.:  [id number]  LOCATION:                               FACILITY:  [facility]  PHYSICIAN:  Harvie JuniorJohn L. Wei Newbrough, M.D.   DATE OF BIRTH:  [birth year]  DATE OF PROCEDURE:  09/19/2015 DATE OF DISCHARGE:  09/19/2015                              OPERATIVE REPORT   PREOPERATIVE DIAGNOSIS:  Medial meniscal tear.  POSTOPERATIVE DIAGNOSIS:  Medial and lateral meniscal tears with chondromalacia of the patellofemoral joint and multiple cartilaginous loose bodies.  PROCEDURES: 1. Partial medial and partial lateral meniscectomy. 2. Removal of multiple chondral loose bodies. 3. Debridement of patellofemoral joint down to bleeding bone.  SURGEON:  Harvie JuniorJohn L. Clevland Cork, M.D.  ASSISTANT:  Marshia LyJames Bethune, P.A.  ANESTHESIA:  General.  BRIEF HISTORY:  Reginald [name] is a 49 year old male with a history of significant complaints of right knee pain, had been treated conservatively for period of time.  After failure of conservative care, MRI was obtained, which showed that he had posterior horn medial meniscal tear.  He was brought to the operating room for evaluation and fixation as needed.  DESCRIPTION OF PROCEDURE:  The patient was brought to the operating room.  After adequate anesthesia was obtained with general anesthetic, the patient was placed supine on the operating table.  Right leg was prepped and draped in usual sterile fashion.  Following this, routine arthroscopic examination of the knee revealed there was obvious chondromalacia of the medial femoral condyle and the patellofemoral joint, this was debrided back to a smooth and stable rim.  Posterior horn of medial meniscus had tear on the undersurface and this was debrided back to a smooth rim of the meniscus.  Once this was done, attention was turned towards the ACL, which was normal.  Lateral side had a small outer area of lateral meniscal tear.  There were  multiple cartilaginous loose bodies in that lateral compartment and some that had been identified in the medial compartment as well.  These were debrided with a suction shaver.  The knee at this point was then copiously and thoroughly irrigated and suctioned dry. Arthroscopic portals were closed with bandage and sterile pressure dressing was applied.  The patient was taken to the recovery room, he was noted to be in satisfactory condition.  Estimated blood loss for the procedure was minimal.     Harvie JuniorJohn L. Simon Llamas, M.D.     Ranae PlumberJLG/MEDQ  D:  09/19/2015  T:  09/20/2015  Job:  [id number]

## 2017-09-14 DIAGNOSIS — M25561 Pain in right knee: Secondary | ICD-10-CM | POA: Diagnosis not present

## 2017-09-19 DIAGNOSIS — M25561 Pain in right knee: Secondary | ICD-10-CM | POA: Diagnosis not present

## 2017-10-20 DIAGNOSIS — M25561 Pain in right knee: Secondary | ICD-10-CM | POA: Diagnosis not present

## 2017-12-09 DIAGNOSIS — R05 Cough: Secondary | ICD-10-CM | POA: Diagnosis not present

## 2017-12-09 DIAGNOSIS — I10 Essential (primary) hypertension: Secondary | ICD-10-CM | POA: Diagnosis not present

## 2018-01-19 DIAGNOSIS — M25561 Pain in right knee: Secondary | ICD-10-CM | POA: Diagnosis not present

## 2018-01-20 DIAGNOSIS — Z024 Encounter for examination for driving license: Secondary | ICD-10-CM | POA: Diagnosis not present

## 2018-02-08 DIAGNOSIS — Z23 Encounter for immunization: Secondary | ICD-10-CM | POA: Diagnosis not present

## 2018-02-08 DIAGNOSIS — Z0000 Encounter for general adult medical examination without abnormal findings: Secondary | ICD-10-CM | POA: Diagnosis not present

## 2018-02-08 DIAGNOSIS — Z125 Encounter for screening for malignant neoplasm of prostate: Secondary | ICD-10-CM | POA: Diagnosis not present

## 2018-03-02 ENCOUNTER — Other Ambulatory Visit: Payer: Self-pay | Admitting: Orthopedic Surgery

## 2018-03-02 ENCOUNTER — Encounter (HOSPITAL_COMMUNITY): Payer: Self-pay | Admitting: *Deleted

## 2018-03-22 NOTE — Patient Instructions (Signed)
Reginald [name]  03/22/2018   Your procedure is scheduled on: Wednesday 03/31/2018  Report to [facility] Main  Entrance             Report to admitting at  1045 AM    Call this number if you have problems the morning of surgery [phone]     Remember: Do not eat food :After Midnight.You may have clear liquids from midnight up until 0715 am then nothing until after surgery!     CLEAR LIQUID DIET   Foods Allowed                                                                     Foods Excluded  Coffee and tea, regular and decaf                             liquids that you cannot  Plain Jell-O in any flavor                                             see through such as: Fruit ices (not with fruit pulp)                                     milk, soups, orange juice  Iced Popsicles                                    All solid food Carbonated beverages, regular and diet                                    Cranberry, grape and apple juices Sports drinks like Gatorade Lightly seasoned clear broth or consume(fat free) Sugar, honey syrup  Sample Menu Breakfast                                Lunch                                     Supper Cranberry juice                    Beef broth                            Chicken broth Jell-O                                     Grape juice  Apple juice Coffee or tea                        Jell-O                                      Popsicle                                                Coffee or tea                        Coffee or tea  _____________________________________________________________________     Take these medicines the morning of surgery with A SIP OF WATER: Amlodipine (Norvasc)               You may not have any metal on your body including hair pins and              piercings  Do not wear jewelry, make-up, lotions, powders or perfumes, deodorant             Men may shave  face and neck.   Do not bring valuables to the hospital. [facility] IS NOT             RESPONSIBLE   FOR VALUABLES.  Contacts, dentures or bridgework may not be worn into surgery.  Leave suitcase in the car. After surgery it may be brought to your room.                  Please read over the following fact sheets you were given: _____________________________________________________________________             [facility] - Preparing for Surgery Before surgery, you can play an important role.  Because skin is not sterile, your skin needs to be as free of germs as possible.  You can reduce the number of germs on your skin by washing with CHG (chlorahexidine gluconate) soap before surgery.  CHG is an antiseptic cleaner which kills germs and bonds with the skin to continue killing germs even after washing. Please DO NOT use if you have an allergy to CHG or antibacterial soaps.  If your skin becomes reddened/irritated stop using the CHG and inform your nurse when you arrive at Short Stay. Do not shave (including legs and underarms) for at least 48 hours prior to the first CHG shower.  You may shave your face/neck. Please follow these instructions carefully:  1.  Shower with CHG Soap the night before surgery and the  morning of Surgery.  2.  If you choose to wash your hair, wash your hair first as usual with your  normal  shampoo.  3.  After you shampoo, rinse your hair and body thoroughly to remove the  shampoo.                           4.  Use CHG as you would any other liquid soap.  You can apply chg directly  to the skin and wash                       Gently with a scrungie or clean washcloth.  5.  Apply the CHG Soap to your body ONLY FROM THE NECK DOWN.   Do not use on face/ open                           Wound or open sores. Avoid contact with eyes, ears mouth and genitals (private parts).                       Wash face,  Genitals (private parts) with your normal soap.             6.   Wash thoroughly, paying special attention to the area where your surgery  will be performed.  7.  Thoroughly rinse your body with warm water from the neck down.  8.  DO NOT shower/wash with your normal soap after using and rinsing off  the CHG Soap.                9.  Pat yourself dry with a clean towel.            10.  Wear clean pajamas.            11.  Place clean sheets on your bed the night of your first shower and do not  sleep with pets. Day of Surgery : Do not apply any lotions/deodorants the morning of surgery.  Please wear clean clothes to the hospital/surgery center.  FAILURE TO FOLLOW THESE INSTRUCTIONS MAY RESULT IN THE CANCELLATION OF YOUR SURGERY PATIENT SIGNATURE_________________________________  NURSE SIGNATURE__________________________________  ________________________________________________________________________   Reginald [name]  An incentive spirometer is a tool that can help keep your lungs clear and active. This tool measures how well you are filling your lungs with each breath. Taking long deep breaths may help reverse or decrease the chance of developing breathing (pulmonary) problems (especially infection) following:  A long period of time when you are unable to move or be active. BEFORE THE PROCEDURE   If the spirometer includes an indicator to show your best effort, your nurse or respiratory therapist will set it to a desired goal.  If possible, sit up straight or lean slightly forward. Try not to slouch.  Hold the incentive spirometer in an upright position. INSTRUCTIONS FOR USE  1. Sit on the edge of your bed if possible, or sit up as far as you can in bed or on a chair. 2. Hold the incentive spirometer in an upright position. 3. Breathe out normally. 4. Place the mouthpiece in your mouth and seal your lips tightly around it. 5. Breathe in slowly and as deeply as possible, raising the piston or the ball toward the top of the column. 6. Hold  your breath for 3-5 seconds or for as long as possible. Allow the piston or ball to fall to the bottom of the column. 7. Remove the mouthpiece from your mouth and breathe out normally. 8. Rest for a few seconds and repeat Steps 1 through 7 at least 10 times every 1-2 hours when you are awake. Take your time and take a few normal breaths between deep breaths. 9. The spirometer may include an indicator to show your best effort. Use the indicator as a goal to work toward during each repetition. 10. After each set of 10 deep breaths, practice coughing to be sure your lungs are clear. If you have an incision (the cut made at the time of surgery), support your incision when coughing by placing  a pillow or rolled up towels firmly against it. Once you are able to get out of bed, walk around indoors and cough well. You may stop using the incentive spirometer when instructed by your caregiver.  RISKS AND COMPLICATIONS  Take your time so you do not get dizzy or light-headed.  If you are in pain, you may need to take or ask for pain medication before doing incentive spirometry. It is harder to take a deep breath if you are having pain. AFTER USE  Rest and breathe slowly and easily.  It can be helpful to keep track of a log of your progress. Your caregiver can provide you with a simple table to help with this. If you are using the spirometer at home, follow these instructions: SEEK MEDICAL CARE IF:   You are having difficultly using the spirometer.  You have trouble using the spirometer as often as instructed.  Your pain medication is not giving enough relief while using the spirometer.  You develop fever of 100.5 F (38.1 C) or higher. SEEK IMMEDIATE MEDICAL CARE IF:   You cough up bloody sputum that had not been present before.  You develop fever of 102 F (38.9 C) or greater.  You develop worsening pain at or near the incision site. MAKE SURE YOU:   Understand these instructions.  Will  watch your condition.  Will get help right away if you are not doing well or get worse. Document Released: 03/16/2007 Document Revised: 01/26/2012 Document Reviewed: 05/17/2007 Exit[employer] Patient Information 2014 Exit[employer], [place].   ________________________________________________________________________  WHAT IS A BLOOD TRANSFUSION? Blood Transfusion Information  A transfusion is the replacement of blood or some of its parts. Blood is made up of multiple cells which provide different functions.  Red blood cells carry oxygen and are used for blood loss replacement.  White blood cells fight against infection.  Platelets control bleeding.  Plasma helps clot blood.  Other blood products are available for specialized needs, such as hemophilia or other clotting disorders. BEFORE THE TRANSFUSION  Who gives blood for transfusions?   Healthy volunteers who are fully evaluated to make sure their blood is safe. This is blood bank blood. Transfusion therapy is the safest it has ever been in the practice of medicine. Before blood is taken from a donor, a complete history is taken to make sure that person has no history of diseases nor engages in risky social behavior (examples are intravenous drug use or sexual activity with multiple partners). The donor's travel history is screened to minimize risk of transmitting infections, such as malaria. The donated blood is tested for signs of infectious diseases, such as HIV and hepatitis. The blood is then tested to be sure it is compatible with you in order to minimize the chance of a transfusion reaction. If you or a relative donates blood, this is often done in anticipation of surgery and is not appropriate for emergency situations. It takes many days to process the donated blood. RISKS AND COMPLICATIONS Although transfusion therapy is very safe and saves many lives, the main dangers of transfusion include:   Getting an infectious disease.  Developing a  transfusion reaction. This is an allergic reaction to something in the blood you were given. Every precaution is taken to prevent this. The decision to have a blood transfusion has been considered carefully by your caregiver before blood is given. Blood is not given unless the benefits outweigh the risks. AFTER THE TRANSFUSION  Right after receiving a blood transfusion,  you will usually feel much better and more energetic. This is especially true if your red blood cells have gotten low (anemic). The transfusion raises the level of the red blood cells which carry oxygen, and this usually causes an energy increase.  The nurse administering the transfusion will monitor you carefully for complications. HOME CARE INSTRUCTIONS  No special instructions are needed after a transfusion. You may find your energy is better. Speak with your caregiver about any limitations on activity for underlying diseases you may have. SEEK MEDICAL CARE IF:   Your condition is not improving after your transfusion.  You develop redness or irritation at the intravenous (IV) site. SEEK IMMEDIATE MEDICAL CARE IF:  Any of the following symptoms occur over the next 12 hours:  Shaking chills.  You have a temperature by mouth above 102 F (38.9 C), not controlled by medicine.  Chest, back, or muscle pain.  People around you feel you are not acting correctly or are confused.  Shortness of breath or difficulty breathing.  Dizziness and fainting.  You get a rash or develop hives.  You have a decrease in urine output.  Your urine turns a dark color or changes to pink, red, or brown. Any of the following symptoms occur over the next 10 days:  You have a temperature by mouth above 102 F (38.9 C), not controlled by medicine.  Shortness of breath.  Weakness after normal activity.  The white part of the eye turns yellow (jaundice).  You have a decrease in the amount of urine or are urinating less often.  Your  urine turns a dark color or changes to pink, red, or brown. Document Released: 10/31/2000 Document Revised: 01/26/2012 Document Reviewed: 06/19/2008 [facility] Patient Information 2014 [place], [place].  _______________________________________________________________________

## 2018-03-23 DIAGNOSIS — Z125 Encounter for screening for malignant neoplasm of prostate: Secondary | ICD-10-CM | POA: Diagnosis not present

## 2018-03-23 DIAGNOSIS — R3121 Asymptomatic microscopic hematuria: Secondary | ICD-10-CM | POA: Diagnosis not present

## 2018-03-23 DIAGNOSIS — R350 Frequency of micturition: Secondary | ICD-10-CM | POA: Diagnosis not present

## 2018-03-24 ENCOUNTER — Encounter (HOSPITAL_COMMUNITY): Payer: Self-pay

## 2018-03-24 ENCOUNTER — Other Ambulatory Visit: Payer: Self-pay

## 2018-03-24 ENCOUNTER — Ambulatory Visit (HOSPITAL_COMMUNITY)
Admission: RE | Admit: 2018-03-24 | Discharge: 2018-03-24 | Disposition: A | Discharge status: Home / Self Care | Payer: 59 | Source: Ambulatory Visit | Attending: Orthopedic Surgery | Admitting: Orthopedic Surgery

## 2018-03-24 ENCOUNTER — Encounter (INDEPENDENT_AMBULATORY_CARE_PROVIDER_SITE_OTHER): Payer: Self-pay

## 2018-03-24 ENCOUNTER — Encounter (HOSPITAL_COMMUNITY)
Admission: RE | Admit: 2018-03-24 | Discharge: 2018-03-24 | Disposition: A | Discharge status: Home / Self Care | Payer: 59 | Source: Ambulatory Visit | Attending: Orthopedic Surgery | Admitting: Orthopedic Surgery

## 2018-03-24 DIAGNOSIS — Z01810 Encounter for preprocedural cardiovascular examination: Secondary | ICD-10-CM | POA: Insufficient documentation

## 2018-03-24 DIAGNOSIS — R001 Bradycardia, unspecified: Secondary | ICD-10-CM | POA: Diagnosis not present

## 2018-03-24 DIAGNOSIS — M1711 Unilateral primary osteoarthritis, right knee: Secondary | ICD-10-CM | POA: Insufficient documentation

## 2018-03-24 DIAGNOSIS — I10 Essential (primary) hypertension: Secondary | ICD-10-CM | POA: Diagnosis not present

## 2018-03-24 DIAGNOSIS — Z01812 Encounter for preprocedural laboratory examination: Secondary | ICD-10-CM | POA: Diagnosis not present

## 2018-03-24 DIAGNOSIS — Z01818 Encounter for other preprocedural examination: Secondary | ICD-10-CM

## 2018-03-24 LAB — BASIC METABOLIC PANEL
Anion gap: 9 (ref 5–15)
BUN: 10 mg/dL (ref 6–20)
CHLORIDE: 106 mmol/L (ref 101–111)
CO2: 25 mmol/L (ref 22–32)
CREATININE: 1.05 mg/dL (ref 0.61–1.24)
Calcium: 8.6 mg/dL — ABNORMAL LOW (ref 8.9–10.3)
GFR calc Af Amer: >60 mL/min (ref >60)
GLUCOSE: 102 mg/dL — AB (ref 65–99)
POTASSIUM: 3.7 mmol/L (ref 3.5–5.1)
SODIUM: 140 mmol/L (ref 135–145)

## 2018-03-24 LAB — URINALYSIS, ROUTINE W REFLEX MICROSCOPIC
BILIRUBIN URINE: NEGATIVE
Glucose, UA: NEGATIVE mg/dL
HGB URINE DIPSTICK: NEGATIVE
Ketones, ur: NEGATIVE mg/dL
Leukocytes, UA: NEGATIVE
Nitrite: NEGATIVE
PROTEIN: NEGATIVE mg/dL
Specific Gravity, Urine: 1.008 (ref 1.005–1.030)
pH: 6 (ref 5.0–8.0)

## 2018-03-24 LAB — CBC WITH DIFFERENTIAL/PLATELET
Basophils Absolute: 0.1 10*3/uL (ref 0.0–0.1)
Basophils Relative: 1 %
EOS ABS: 0.4 10*3/uL (ref 0.0–0.7)
EOS PCT: 5 %
HCT: 40.1 % (ref 39.0–52.0)
Hemoglobin: 14.1 g/dL (ref 13.0–17.0)
LYMPHS ABS: 3.1 10*3/uL (ref 0.7–4.0)
Lymphocytes Relative: 38 %
MCH: 31.6 pg (ref 26.0–34.0)
MCHC: 35.2 g/dL (ref 30.0–36.0)
MCV: 89.9 fL (ref 78.0–100.0)
MONO ABS: 0.8 10*3/uL (ref 0.1–1.0)
Monocytes Relative: 9 %
Neutro Abs: 4 10*3/uL (ref 1.7–7.7)
Neutrophils Relative %: 47 %
Platelets: 195 10*3/uL (ref 150–400)
RBC: 4.46 MIL/uL (ref 4.22–5.81)
RDW: 12.5 % (ref 11.5–15.5)
WBC: 8.4 10*3/uL (ref 4.0–10.5)

## 2018-03-24 LAB — PROTIME-INR
INR: 1.05
Prothrombin Time: 13.6 seconds (ref 11.4–15.2)

## 2018-03-24 LAB — ABO/RH: ABO/RH(D): O NEG

## 2018-03-24 LAB — APTT: aPTT: 31 seconds (ref 24–36)

## 2018-03-24 LAB — SURGICAL PCR SCREEN
MRSA, PCR: NEGATIVE
Staphylococcus aureus: NEGATIVE

## 2018-03-24 IMAGING — DX DG CHEST 2V
2 series · 2 of 2 positions shown · non-contrast
Comparison: [DATE]

CLINICAL DATA: Preop evaluation, hypertension

EXAM:
CHEST - 2 VIEW

[chest pa]
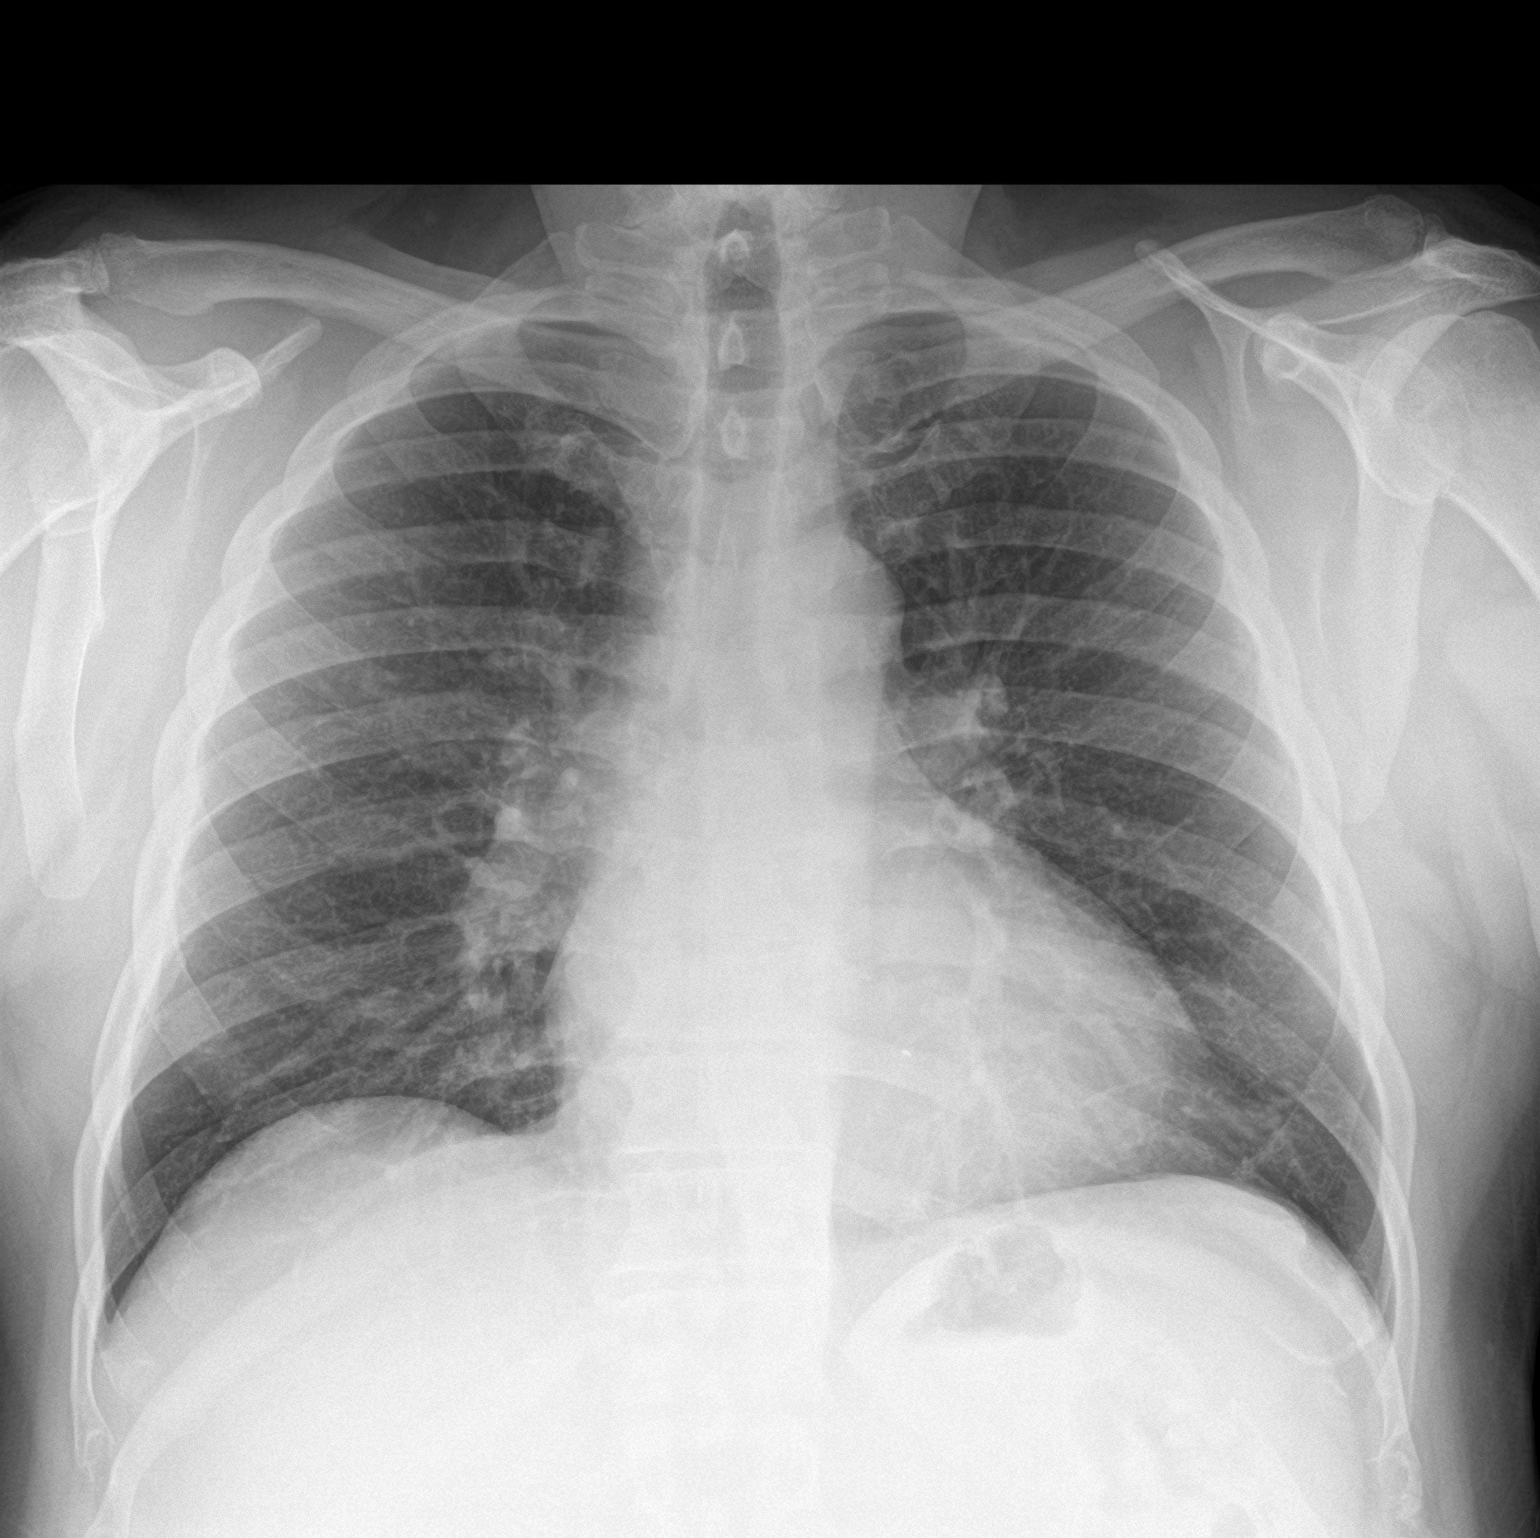

[chest lat]
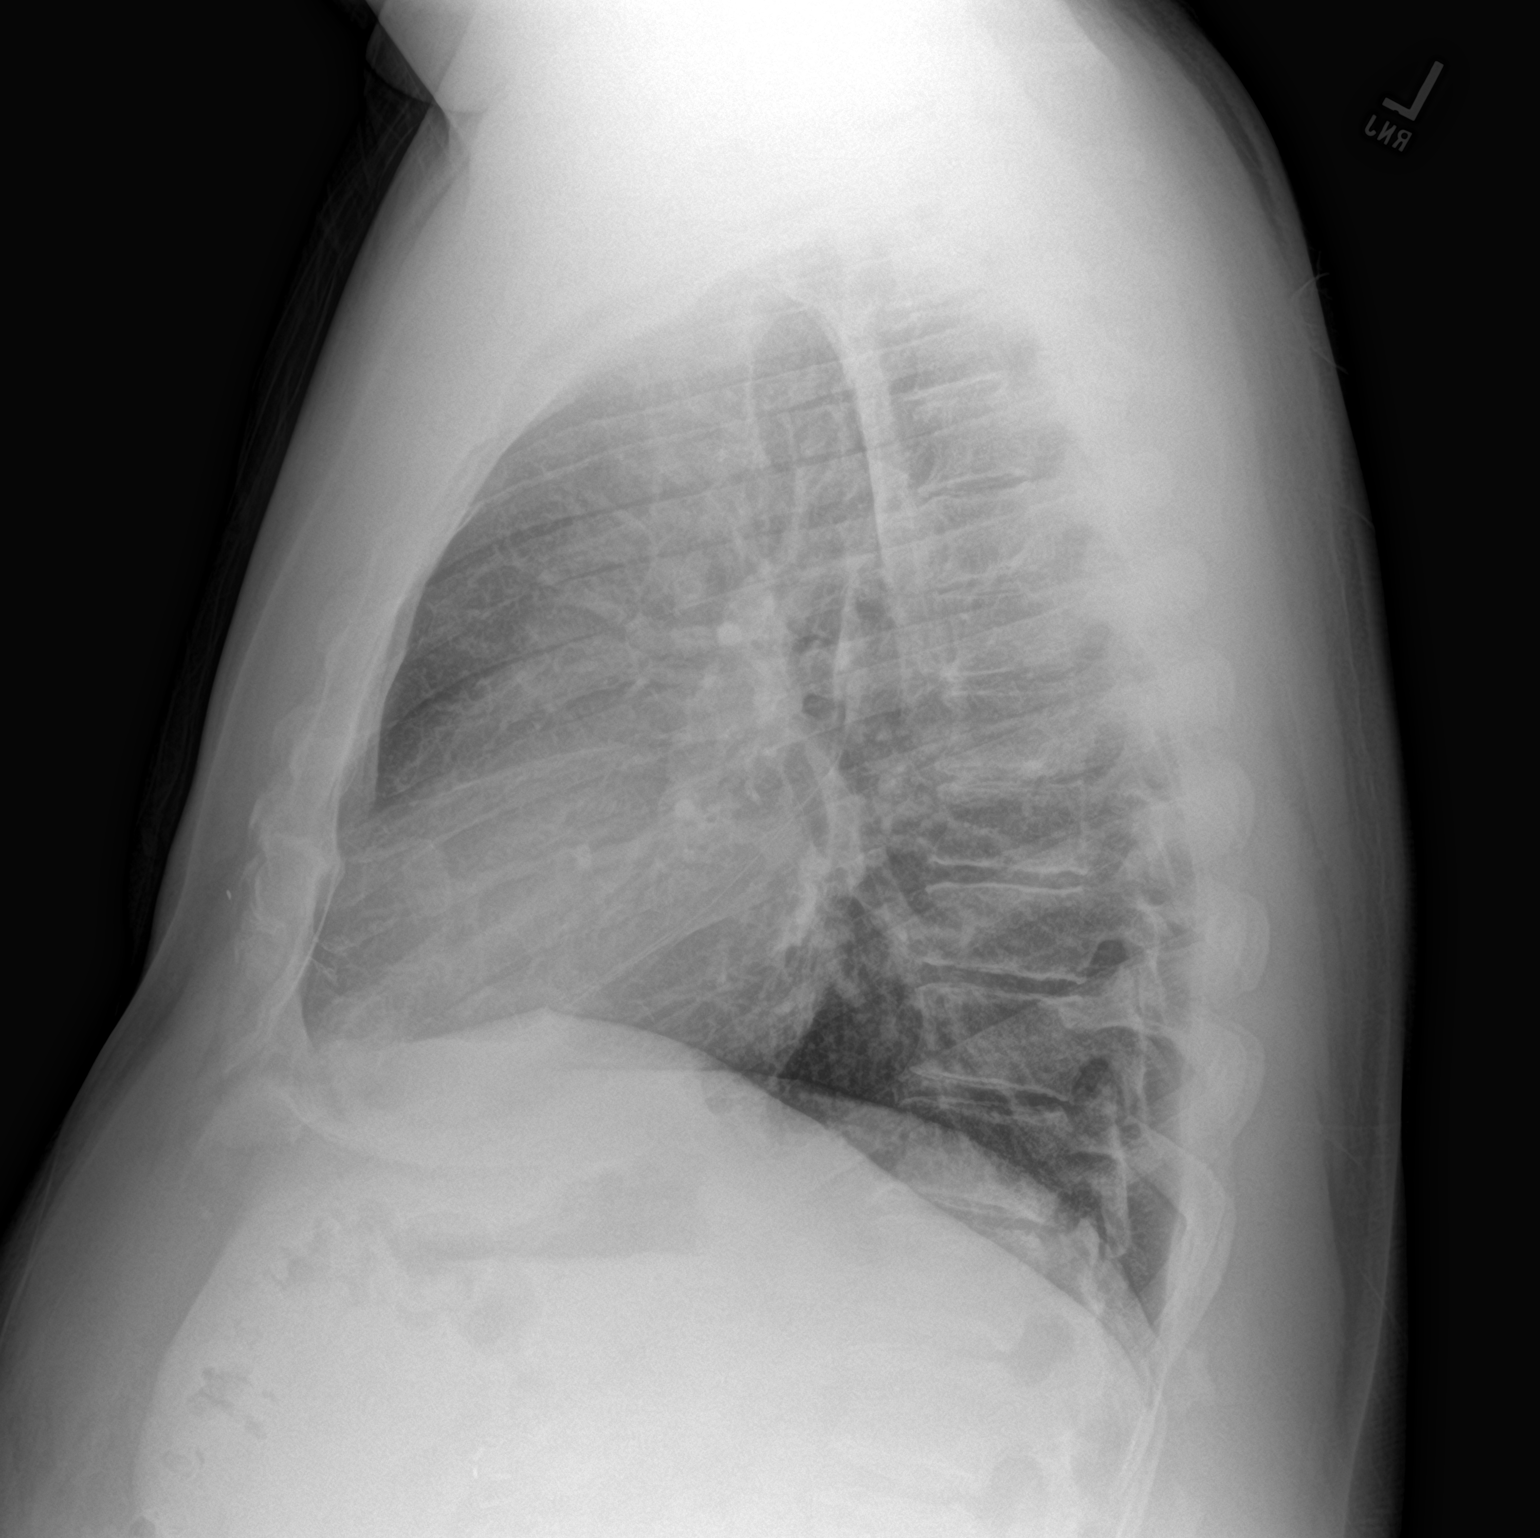

[2 of 2 positions shown; findings below may reference images not displayed]

FINDINGS: The heart size and mediastinal contours are within normal limits.
Both lungs are clear. The visualized skeletal structures are
unremarkable.
IMPRESSION: No active cardiopulmonary disease.

## 2018-03-24 NOTE — Progress Notes (Signed)
   03/24/18 1107  OBSTRUCTIVE SLEEP APNEA  Have you ever been diagnosed with sleep apnea through a sleep study? No  Do you snore loudly (loud enough to be heard through closed doors)?  1  Do you often feel tired, fatigued, or sleepy during the daytime (such as falling asleep during driving or talking to someone)? 0  Has anyone observed you stop breathing during your sleep? 0  Do you have, or are you being treated for high blood pressure? 1  BMI more than 35 kg/m2? 1  Age > 50 (1-yes) 1  Neck circumference greater than:Male 16 inches or larger, Male 17inches or larger? 1  Male Gender (Yes=1) 1  Obstructive Sleep Apnea Score 6  Score 5 or greater  Results sent to PCP

## 2018-03-30 MED ORDER — TRANEXAMIC ACID 1000 MG/10ML IV SOLN
1000.0000 mg | INTRAVENOUS | Status: AC
  Administered 2018-03-31: 1000 mg via INTRAVENOUS
  Filled 2018-03-30: qty 1100

## 2018-03-31 ENCOUNTER — Other Ambulatory Visit: Payer: Self-pay

## 2018-03-31 ENCOUNTER — Encounter (HOSPITAL_COMMUNITY): Payer: Self-pay | Admitting: *Deleted

## 2018-03-31 ENCOUNTER — Ambulatory Visit (HOSPITAL_COMMUNITY): Payer: 59 | Admitting: Anesthesiology

## 2018-03-31 ENCOUNTER — Inpatient Hospital Stay (HOSPITAL_COMMUNITY)
Admission: AD | Admit: 2018-03-31 | Discharge: 2018-04-01 | DRG: 470 | Disposition: A | Discharge status: Home Health | Payer: 59 | Source: Ambulatory Visit | Attending: Orthopedic Surgery | Admitting: Orthopedic Surgery

## 2018-03-31 ENCOUNTER — Encounter (HOSPITAL_COMMUNITY)
Admission: AD | Disposition: A | Discharge status: Home Health | Payer: Self-pay | Source: Ambulatory Visit | Attending: Orthopedic Surgery

## 2018-03-31 DIAGNOSIS — Z6830 Body mass index (BMI) 30.0-30.9, adult: Secondary | ICD-10-CM

## 2018-03-31 DIAGNOSIS — Z96651 Presence of right artificial knee joint: Secondary | ICD-10-CM | POA: Diagnosis not present

## 2018-03-31 DIAGNOSIS — Z9049 Acquired absence of other specified parts of digestive tract: Secondary | ICD-10-CM | POA: Diagnosis not present

## 2018-03-31 DIAGNOSIS — Z886 Allergy status to analgesic agent status: Secondary | ICD-10-CM | POA: Diagnosis not present

## 2018-03-31 DIAGNOSIS — I10 Essential (primary) hypertension: Secondary | ICD-10-CM | POA: Diagnosis present

## 2018-03-31 DIAGNOSIS — E669 Obesity, unspecified: Secondary | ICD-10-CM | POA: Diagnosis present

## 2018-03-31 DIAGNOSIS — Z8249 Family history of ischemic heart disease and other diseases of the circulatory system: Secondary | ICD-10-CM

## 2018-03-31 DIAGNOSIS — M1711 Unilateral primary osteoarthritis, right knee: Secondary | ICD-10-CM | POA: Diagnosis not present

## 2018-03-31 DIAGNOSIS — G8918 Other acute postprocedural pain: Secondary | ICD-10-CM | POA: Diagnosis not present

## 2018-03-31 HISTORY — PX: TOTAL KNEE ARTHROPLASTY: SHX125

## 2018-03-31 LAB — TYPE AND SCREEN
ABO/RH(D): O NEG
ANTIBODY SCREEN: NEGATIVE

## 2018-03-31 SURGERY — ARTHROPLASTY, KNEE, TOTAL
Anesthesia: Spinal | Site: Knee | Laterality: Right

## 2018-03-31 MED ORDER — ONDANSETRON HCL 4 MG PO TABS: 4.0000 mg | ORAL_TABLET | Freq: Four times a day (QID) | ORAL | Status: DC | PRN

## 2018-03-31 MED ORDER — ASPIRIN EC 325 MG PO TBEC: 325.0000 mg | DELAYED_RELEASE_TABLET | Freq: Two times a day (BID) | ORAL | 0 refills | Status: DC

## 2018-03-31 MED ORDER — ONDANSETRON HCL 4 MG/2ML IJ SOLN
INTRAMUSCULAR | Status: AC
  Filled 2018-03-31: qty 2

## 2018-03-31 MED ORDER — ONDANSETRON HCL 4 MG/2ML IJ SOLN
INTRAMUSCULAR | Status: DC | PRN
  Administered 2018-03-31: 4 mg via INTRAVENOUS

## 2018-03-31 MED ORDER — DEXAMETHASONE SODIUM PHOSPHATE 10 MG/ML IJ SOLN
10.0000 mg | Freq: Three times a day (TID) | INTRAMUSCULAR | Status: DC
  Administered 2018-03-31 – 2018-04-01 (×2): 10 mg via INTRAVENOUS
  Filled 2018-03-31 (×2): qty 1

## 2018-03-31 MED ORDER — BUPIVACAINE IN DEXTROSE 0.75-8.25 % IT SOLN
INTRATHECAL | Status: DC | PRN
  Administered 2018-03-31: 2 mL via INTRATHECAL

## 2018-03-31 MED ORDER — 0.9 % SODIUM CHLORIDE (POUR BTL) OPTIME
TOPICAL | Status: DC | PRN
  Administered 2018-03-31: 1000 mL

## 2018-03-31 MED ORDER — GABAPENTIN 300 MG PO CAPS
300.0000 mg | ORAL_CAPSULE | Freq: Two times a day (BID) | ORAL | Status: DC
  Administered 2018-03-31 – 2018-04-01 (×2): 300 mg via ORAL
  Filled 2018-03-31 (×2): qty 1

## 2018-03-31 MED ORDER — DOCUSATE SODIUM 100 MG PO CAPS
100.0000 mg | ORAL_CAPSULE | Freq: Two times a day (BID) | ORAL | Status: DC
  Administered 2018-03-31 – 2018-04-01 (×2): 100 mg via ORAL
  Filled 2018-03-31 (×2): qty 1

## 2018-03-31 MED ORDER — BISACODYL 5 MG PO TBEC: 5.0000 mg | DELAYED_RELEASE_TABLET | Freq: Every day | ORAL | Status: DC | PRN

## 2018-03-31 MED ORDER — PROPOFOL 500 MG/50ML IV EMUL
INTRAVENOUS | Status: DC | PRN
  Administered 2018-03-31: 75 ug/kg/min via INTRAVENOUS

## 2018-03-31 MED ORDER — CEFAZOLIN SODIUM-DEXTROSE 2-4 GM/100ML-% IV SOLN
2.0000 g | Freq: Four times a day (QID) | INTRAVENOUS | Status: AC
  Administered 2018-03-31 – 2018-04-01 (×2): 2 g via INTRAVENOUS
  Filled 2018-03-31 (×3): qty 100

## 2018-03-31 MED ORDER — ACETAMINOPHEN 325 MG PO TABS
325.0000 mg | ORAL_TABLET | Freq: Four times a day (QID) | ORAL | Status: DC | PRN
  Filled 2018-03-31: qty 2

## 2018-03-31 MED ORDER — MIDAZOLAM HCL 2 MG/2ML IJ SOLN
1.0000 mg | INTRAMUSCULAR | Status: DC
  Administered 2018-03-31 (×3): 2 mg via INTRAVENOUS
  Filled 2018-03-31: qty 2

## 2018-03-31 MED ORDER — SODIUM CHLORIDE 0.9 % IJ SOLN
INTRAMUSCULAR | Status: DC | PRN
  Administered 2018-03-31: 30 mL

## 2018-03-31 MED ORDER — OXYCODONE HCL 5 MG PO TABS
5.0000 mg | ORAL_TABLET | ORAL | Status: DC | PRN
  Administered 2018-03-31: 10 mg via ORAL
  Administered 2018-03-31: 5 mg via ORAL
  Administered 2018-04-01 (×4): 10 mg via ORAL
  Filled 2018-03-31 (×2): qty 2
  Filled 2018-03-31: qty 1
  Filled 2018-03-31 (×3): qty 2

## 2018-03-31 MED ORDER — DEXAMETHASONE SODIUM PHOSPHATE 10 MG/ML IJ SOLN
INTRAMUSCULAR | Status: DC | PRN
  Administered 2018-03-31: 10 mg via INTRAVENOUS

## 2018-03-31 MED ORDER — LOSARTAN POTASSIUM 50 MG PO TABS
50.0000 mg | ORAL_TABLET | Freq: Every day | ORAL | Status: DC
  Administered 2018-04-01: 50 mg via ORAL
  Filled 2018-03-31: qty 1

## 2018-03-31 MED ORDER — DIPHENHYDRAMINE HCL 12.5 MG/5ML PO ELIX: 12.5000 mg | ORAL_SOLUTION | ORAL | Status: DC | PRN

## 2018-03-31 MED ORDER — TIZANIDINE HCL 2 MG PO TABS: 2.0000 mg | ORAL_TABLET | Freq: Three times a day (TID) | ORAL | 0 refills | Status: DC | PRN

## 2018-03-31 MED ORDER — ASPIRIN EC 325 MG PO TBEC: 325.0000 mg | DELAYED_RELEASE_TABLET | Freq: Two times a day (BID) | ORAL | Status: DC

## 2018-03-31 MED ORDER — CHLORHEXIDINE GLUCONATE 4 % EX LIQD: 60.0000 mL | Freq: Once | CUTANEOUS | Status: DC

## 2018-03-31 MED ORDER — METHOCARBAMOL 500 MG PO TABS
500.0000 mg | ORAL_TABLET | Freq: Four times a day (QID) | ORAL | Status: DC | PRN
  Administered 2018-03-31 – 2018-04-01 (×3): 500 mg via ORAL
  Filled 2018-03-31 (×3): qty 1

## 2018-03-31 MED ORDER — ONDANSETRON HCL 4 MG/2ML IJ SOLN: 4.0000 mg | Freq: Four times a day (QID) | INTRAMUSCULAR | Status: DC | PRN

## 2018-03-31 MED ORDER — SODIUM CHLORIDE 0.9 % IR SOLN
Status: DC | PRN
  Administered 2018-03-31: 1000 mL

## 2018-03-31 MED ORDER — TRANEXAMIC ACID 1000 MG/10ML IV SOLN
1000.0000 mg | Freq: Once | INTRAVENOUS | Status: AC
  Administered 2018-03-31: 1000 mg via INTRAVENOUS
  Filled 2018-03-31: qty 1100

## 2018-03-31 MED ORDER — POLYETHYLENE GLYCOL 3350 17 G PO PACK: 17.0000 g | PACK | Freq: Every day | ORAL | Status: DC | PRN

## 2018-03-31 MED ORDER — CLONIDINE HCL (ANALGESIA) 100 MCG/ML EP SOLN
EPIDURAL | Status: DC | PRN
  Administered 2018-03-31: 50 ug

## 2018-03-31 MED ORDER — HYDROCHLOROTHIAZIDE 12.5 MG PO CAPS
12.5000 mg | ORAL_CAPSULE | Freq: Every day | ORAL | Status: DC
Start: 2018-03-31 — End: 2018-04-01
  Administered 2018-04-01: 12.5 mg via ORAL
  Filled 2018-03-31: qty 1

## 2018-03-31 MED ORDER — HYDROMORPHONE HCL 1 MG/ML IJ SOLN
0.5000 mg | INTRAMUSCULAR | Status: DC | PRN
  Administered 2018-03-31: 0.5 mg via INTRAVENOUS
  Administered 2018-03-31: 1 mg via INTRAVENOUS
  Administered 2018-03-31: 0.5 mg via INTRAVENOUS
  Administered 2018-04-01: 1 mg via INTRAVENOUS
  Filled 2018-03-31 (×4): qty 1

## 2018-03-31 MED ORDER — FENTANYL CITRATE (PF) 100 MCG/2ML IJ SOLN
50.0000 ug | INTRAMUSCULAR | Status: DC
  Administered 2018-03-31: 100 ug via INTRAVENOUS
  Filled 2018-03-31: qty 2

## 2018-03-31 MED ORDER — BUPIVACAINE-EPINEPHRINE 0.5% -1:200000 IJ SOLN
INTRAMUSCULAR | Status: AC
  Filled 2018-03-31: qty 1

## 2018-03-31 MED ORDER — PROMETHAZINE HCL 25 MG/ML IJ SOLN: 6.2500 mg | INTRAMUSCULAR | Status: DC | PRN

## 2018-03-31 MED ORDER — METHOCARBAMOL 1000 MG/10ML IJ SOLN
500.0000 mg | Freq: Four times a day (QID) | INTRAVENOUS | Status: DC | PRN
  Administered 2018-03-31: 500 mg via INTRAVENOUS
  Filled 2018-03-31: qty 550

## 2018-03-31 MED ORDER — ASPIRIN EC 325 MG PO TBEC
325.0000 mg | DELAYED_RELEASE_TABLET | Freq: Two times a day (BID) | ORAL | Status: DC
  Administered 2018-04-01: 325 mg via ORAL
  Filled 2018-03-31: qty 1

## 2018-03-31 MED ORDER — CEFAZOLIN SODIUM-DEXTROSE 2-4 GM/100ML-% IV SOLN
2.0000 g | INTRAVENOUS | Status: AC
  Administered 2018-03-31: 2 g via INTRAVENOUS
  Filled 2018-03-31: qty 100

## 2018-03-31 MED ORDER — SODIUM CHLORIDE 0.9 % IJ SOLN
INTRAMUSCULAR | Status: AC
  Filled 2018-03-31: qty 50

## 2018-03-31 MED ORDER — ROPIVACAINE HCL 7.5 MG/ML IJ SOLN
INTRAMUSCULAR | Status: DC | PRN
  Administered 2018-03-31: 30 mL via PERINEURAL

## 2018-03-31 MED ORDER — STERILE WATER FOR IRRIGATION IR SOLN
Status: DC | PRN
  Administered 2018-03-31: 2000 mL

## 2018-03-31 MED ORDER — LOSARTAN POTASSIUM-HCTZ 50-12.5 MG PO TABS: 1.0000 | ORAL_TABLET | Freq: Every day | ORAL | Status: DC

## 2018-03-31 MED ORDER — MIDAZOLAM HCL 2 MG/2ML IJ SOLN
INTRAMUSCULAR | Status: AC
  Filled 2018-03-31: qty 2

## 2018-03-31 MED ORDER — PROPOFOL 500 MG/50ML IV EMUL
INTRAVENOUS | Status: DC | PRN
  Administered 2018-03-31: 30 mg via INTRAVENOUS

## 2018-03-31 MED ORDER — OXYCODONE-ACETAMINOPHEN 5-325 MG PO TABS: 1.0000 | ORAL_TABLET | Freq: Four times a day (QID) | ORAL | 0 refills | Status: DC | PRN

## 2018-03-31 MED ORDER — DOCUSATE SODIUM 100 MG PO CAPS: 100.0000 mg | ORAL_CAPSULE | Freq: Two times a day (BID) | ORAL | 0 refills | Status: DC

## 2018-03-31 MED ORDER — BUPIVACAINE-EPINEPHRINE 0.5% -1:200000 IJ SOLN
INTRAMUSCULAR | Status: DC | PRN
  Administered 2018-03-31: 50 mL

## 2018-03-31 MED ORDER — ALUM & MAG HYDROXIDE-SIMETH 200-200-20 MG/5ML PO SUSP: 30.0000 mL | ORAL | Status: DC | PRN

## 2018-03-31 MED ORDER — BUPIVACAINE LIPOSOME 1.3 % IJ SUSP
20.0000 mL | Freq: Once | INTRAMUSCULAR | Status: AC
Start: 2018-03-31 — End: 2018-03-31
  Administered 2018-03-31: 20 mL
  Filled 2018-03-31: qty 20

## 2018-03-31 MED ORDER — MAGNESIUM CITRATE PO SOLN: 1.0000 | Freq: Once | ORAL | Status: DC | PRN

## 2018-03-31 MED ORDER — SODIUM CHLORIDE 0.9 % IV SOLN
INTRAVENOUS | Status: DC
  Administered 2018-03-31: 100 mL/h via INTRAVENOUS
  Administered 2018-04-01: 03:00:00 via INTRAVENOUS

## 2018-03-31 MED ORDER — PROPOFOL 10 MG/ML IV BOLUS
INTRAVENOUS | Status: AC
  Filled 2018-03-31: qty 60

## 2018-03-31 MED ORDER — AMLODIPINE BESYLATE 10 MG PO TABS
10.0000 mg | ORAL_TABLET | Freq: Every day | ORAL | Status: DC
  Administered 2018-04-01: 10 mg via ORAL
  Filled 2018-03-31: qty 1

## 2018-03-31 MED ORDER — MEPERIDINE HCL 50 MG/ML IJ SOLN: 6.2500 mg | INTRAMUSCULAR | Status: DC | PRN

## 2018-03-31 MED ORDER — HYDROMORPHONE HCL 1 MG/ML IJ SOLN: 0.2500 mg | INTRAMUSCULAR | Status: DC | PRN

## 2018-03-31 MED ORDER — LACTATED RINGERS IV SOLN
INTRAVENOUS | Status: DC
  Administered 2018-03-31 (×2): via INTRAVENOUS

## 2018-03-31 MED ORDER — TRAMADOL HCL 50 MG PO TABS
50.0000 mg | ORAL_TABLET | Freq: Four times a day (QID) | ORAL | Status: DC
  Administered 2018-03-31 – 2018-04-01 (×3): 50 mg via ORAL
  Filled 2018-03-31 (×3): qty 1

## 2018-03-31 SURGICAL SUPPLY — 59 items
BAG ZIPLOCK 12X15 (MISCELLANEOUS) ×2 IMPLANT
BANDAGE ACE 6X5 VEL STRL LF (GAUZE/BANDAGES/DRESSINGS) ×2 IMPLANT
BENZOIN TINCTURE PRP APPL 2/3 (GAUZE/BANDAGES/DRESSINGS) ×2 IMPLANT
BLADE SAGITTAL 25.0X1.19X90 (BLADE) ×2 IMPLANT
BLADE SAW SGTL 11.0X1.19X90.0M (BLADE) ×2 IMPLANT
BOOTIES KNEE HIGH SLOAN (MISCELLANEOUS) ×2 IMPLANT
BOWL SMART MIX CTS (DISPOSABLE) ×2 IMPLANT
CAPT KNEE TOTAL 3 ATTUNE ×2 IMPLANT
CEMENT HV SMART SET (Cement) ×4 IMPLANT
CUFF TOURN SGL QUICK 34 (TOURNIQUET CUFF) ×1
CUFF TRNQT CYL 34X4X40X1 (TOURNIQUET CUFF) ×1 IMPLANT
DECANTER SPIKE VIAL GLASS SM (MISCELLANEOUS) ×4 IMPLANT
DRAPE U-SHAPE 47X51 STRL (DRAPES) ×2 IMPLANT
DRSG AQUACEL AG ADV 3.5X10 (GAUZE/BANDAGES/DRESSINGS) ×2 IMPLANT
DRSG TEGADERM 2-3/8X2-3/4 SM (GAUZE/BANDAGES/DRESSINGS) ×2 IMPLANT
DURAPREP 26ML APPLICATOR (WOUND CARE) ×2 IMPLANT
ELECT REM PT RETURN 15FT ADLT (MISCELLANEOUS) ×2 IMPLANT
GLOVE BIOGEL PI IND STRL 7.0 (GLOVE) ×1 IMPLANT
GLOVE BIOGEL PI IND STRL 7.5 (GLOVE) ×3 IMPLANT
GLOVE BIOGEL PI IND STRL 8 (GLOVE) ×2 IMPLANT
GLOVE BIOGEL PI IND STRL 9 (GLOVE) ×2 IMPLANT
GLOVE BIOGEL PI INDICATOR 7.0 (GLOVE) ×1
GLOVE BIOGEL PI INDICATOR 7.5 (GLOVE) ×3
GLOVE BIOGEL PI INDICATOR 8 (GLOVE) ×2
GLOVE BIOGEL PI INDICATOR 9 (GLOVE) ×2
GLOVE ECLIPSE 7.5 STRL STRAW (GLOVE) ×4 IMPLANT
GLOVE ECLIPSE 8.5 STRL (GLOVE) ×2 IMPLANT
GLOVE ORTHO TXT STRL SZ7.5 (GLOVE) ×2 IMPLANT
GOWN SPEC L3 XXLG W/TWL (GOWN DISPOSABLE) ×4 IMPLANT
GOWN STRL REUS W/TWL XL LVL3 (GOWN DISPOSABLE) ×6 IMPLANT
HANDPIECE INTERPULSE COAX TIP (DISPOSABLE) ×1
HOLDER FOLEY CATH W/STRAP (MISCELLANEOUS) IMPLANT
HOOD PEEL AWAY FLYTE STAYCOOL (MISCELLANEOUS) ×6 IMPLANT
IMMOBILIZER KNEE 20 (SOFTGOODS) ×2
IMMOBILIZER KNEE 20 THIGH 36 (SOFTGOODS) ×1 IMPLANT
MANIFOLD NEPTUNE II (INSTRUMENTS) ×2 IMPLANT
NDL SAFETY ECLIPSE 18X1.5 (NEEDLE) IMPLANT
NEEDLE HYPO 18GX1.5 SHARP (NEEDLE)
NEEDLE HYPO 22GX1.5 SAFETY (NEEDLE) ×2 IMPLANT
NS IRRIG 1000ML POUR BTL (IV SOLUTION) ×2 IMPLANT
PACK ICE MAXI GEL EZY WRAP (MISCELLANEOUS) ×2 IMPLANT
PACK TOTAL KNEE CUSTOM (KITS) ×2 IMPLANT
PADDING CAST COTTON 6X4 STRL (CAST SUPPLIES) ×2 IMPLANT
POSITIONER SURGICAL ARM (MISCELLANEOUS) ×2 IMPLANT
SET HNDPC FAN SPRY TIP SCT (DISPOSABLE) ×1 IMPLANT
STAPLER VISISTAT 35W (STAPLE) IMPLANT
STRIP CLOSURE SKIN 1/2X4 (GAUZE/BANDAGES/DRESSINGS) ×2 IMPLANT
SUT MNCRL AB 3-0 PS2 18 (SUTURE) ×2 IMPLANT
SUT VIC AB 0 CT1 36 (SUTURE) ×4 IMPLANT
SUT VIC AB 1 CT1 36 (SUTURE) ×4 IMPLANT
SUT VIC AB 2-0 CT1 27 (SUTURE) ×2
SUT VIC AB 2-0 CT1 TAPERPNT 27 (SUTURE) ×2 IMPLANT
SYR 3ML LL SCALE MARK (SYRINGE) IMPLANT
SYR CONTROL 10ML LL (SYRINGE) ×2 IMPLANT
TOWEL OR NON WOVEN STRL DISP B (DISPOSABLE) ×2 IMPLANT
TRAY FOLEY MTR SLVR 16FR STAT (SET/KITS/TRAYS/PACK) IMPLANT
WATER STERILE IRR 1000ML POUR (IV SOLUTION) ×4 IMPLANT
WRAP KNEE MAXI GEL POST OP (GAUZE/BANDAGES/DRESSINGS) ×2 IMPLANT
YANKAUER SUCT BULB TIP 10FT TU (MISCELLANEOUS) ×2 IMPLANT

## 2018-03-31 NOTE — Progress Notes (Signed)
Assisted Dr. Germeroth with right, ultrasound guided, adductor canal block. Side rails up, monitors on throughout procedure. See vital signs in flow sheet. Tolerated Procedure well. 

## 2018-03-31 NOTE — Anesthesia Procedure Notes (Signed)
Anesthesia Regional Block: Adductor canal block   Pre-Anesthetic Checklist: ,, timeout performed, Correct Patient, Correct Site, Correct Laterality, Correct Procedure, Correct Position, site marked, Risks and benefits discussed,  Surgical consent,  Pre-op evaluation,  At surgeon's request and post-op pain management  Laterality: Right  Prep: chloraprep       Needles:  Injection technique: Single-shot  Needle Type: Stimiplex     Needle Length: 9cm  Needle Gauge: 21     Additional Needles:   Procedures:,,,, ultrasound used (permanent image in chart),,,,  Narrative:  Start time: 03/31/2018 12:23 PM End time: 03/31/2018 12:26 PM Injection made incrementally with aspirations every 5 mL.  Performed by: Personally  Anesthesiologist: Lewie Loron, MD  Additional Notes: BP cuff, EKG monitors applied. Sedation begun. Artery and nerve location verified with U/S and anesthetic injected incrementally, slowly, and after negative aspirations under direct u/s guidance. Good fascial /perineural spread. Tolerated well.

## 2018-03-31 NOTE — Discharge Instructions (Signed)

## 2018-03-31 NOTE — Brief Op Note (Signed)
03/31/2018  2:07 PM  PATIENT:  Reginald [name]  52 y.o. male  PRE-OPERATIVE DIAGNOSIS:  RIGHT KNEE DEGENERATIVE JOINT DISEASE  POST-OPERATIVE DIAGNOSIS:  RIGHT KNEE DEGENERATIVE JOINT DISEASE  PROCEDURE:  Procedure(s): RIGHT TOTAL KNEE ARTHROPLASTY (Right)  SURGEON:  Surgeon(s) and Role:    Jodi Geralds, MD - Primary  PHYSICIAN ASSISTANT:   ASSISTANTS: JimBethune  ANESTHESIA:   spinal  EBL:  minimal   BLOOD ADMINISTERED:none  DRAINS: none   LOCAL MEDICATIONS USED:  MARCAINE    and OTHER Exparel  SPECIMEN:  No Specimen  DISPOSITION OF SPECIMEN:  N/A  COUNTS:  YES  TOURNIQUET:   Total Tourniquet Time Documented: Thigh (Right) - 54 minutes Total: Thigh (Right) - 54 minutes   DICTATION: .Other Dictation: Dictation Number [id number]  PLAN OF CARE: Admit to inpatient   PATIENT DISPOSITION:  PACU - hemodynamically stable.   Delay start of Pharmacological VTE agent (>24hrs) due to surgical blood loss or risk of bleeding: no

## 2018-03-31 NOTE — Transfer of Care (Signed)
Immediate Anesthesia Transfer of Care Note  Patient: Reginald [name]  Procedure(s) Performed: RIGHT TOTAL KNEE ARTHROPLASTY (Right Knee)  Patient Location: PACU  Anesthesia Type:MAC and Spinal  Level of Consciousness: awake, alert , oriented and patient cooperative  Airway & Oxygen Therapy: Patient Spontanous Breathing and Patient connected to face mask oxygen  Post-op Assessment: Report given to RN and Post -op Vital signs reviewed and stable  Post vital signs: Reviewed and stable  Last Vitals:  Vitals Value Taken Time  BP    Temp    Pulse 56 03/31/2018  2:44 PM  Resp 24 03/31/2018  2:44 PM  SpO2 99 % 03/31/2018  2:44 PM  Vitals shown include unvalidated device data.  Last Pain:  Vitals:   03/31/18 1110  TempSrc: Oral  PainSc:          Complications: No apparent anesthesia complications

## 2018-03-31 NOTE — Anesthesia Procedure Notes (Signed)
Spinal  Patient location during procedure: OR Start time: 03/31/2018 12:41 PM End time: 03/31/2018 12:46 PM Staffing Anesthesiologist: Nolon Nations, MD Resident/CRNA: Glory Buff, CRNA Performed: resident/CRNA  Preanesthetic Checklist Completed: patient identified, site marked, surgical consent, pre-op evaluation, timeout performed, IV checked, risks and benefits discussed and monitors and equipment checked Spinal Block Patient position: sitting Prep: ChloraPrep Patient monitoring: heart rate, continuous pulse ox and blood pressure Approach: midline Location: L2-3 Injection technique: single-shot Needle Needle type: Pencan  Needle gauge: 24 G Needle length: 9 cm Needle insertion depth: 7 cm Assessment Sensory level: T6 Additional Notes Kit expiration date checked and verified.  Skin localization with 1% lidocaine, stick x 1, - paraesthesia, - heme, +CSF pre and post injection, patient tolerated well.

## 2018-03-31 NOTE — Anesthesia Procedure Notes (Signed)
Date/Time: 03/31/2018 12:37 PM Performed by: Thornell Mule, CRNA Oxygen Delivery Method: Simple face mask

## 2018-03-31 NOTE — Progress Notes (Signed)
Patient states that he does not wear a cpap at home and does not need one

## 2018-03-31 NOTE — H&P (Signed)
TOTAL KNEE ADMISSION H&P  Patient is being admitted for right total knee arthroplasty.  Subjective:  Chief Complaint:right knee pain.  HPI: Reginald [name], 52 y.o. male, has a history of pain and functional disability in the right knee due to arthritis and has failed non-surgical conservative treatments for greater than 12 weeks to includeNSAID's and/or analgesics, corticosteriod injections, viscosupplementation injections, flexibility and strengthening excercises, weight reduction as appropriate and activity modification.  Onset of symptoms was gradual, starting 3 years ago with gradually worsening course since that time. The patient noted prior procedures on the knee to include  menisectomy on the right knee(s).  Patient currently rates pain in the right knee(s) at 9 out of 10 with activity. Patient has night pain, worsening of pain with activity and weight bearing, pain that interferes with activities of daily living, pain with passive range of motion, crepitus and joint swelling.  Patient has evidence of subchondral cysts, periarticular osteophytes, joint subluxation and joint space narrowing by imaging studies. This patient has had failure of all reasonable conservative care. There is no active infection.  Patient Active Problem List   Diagnosis Date Noted  . Hypertension 12/02/2011  . Obesity (BMI 30-39.9) 12/02/2011   Past Medical History:  Diagnosis Date  . Arthritis   . Headache(784.0)   . Hypertension     Past Surgical History:  Procedure Laterality Date  . CHOLECYSTECTOMY  12/03/2011   Procedure: LAPAROSCOPIC CHOLECYSTECTOMY WITH INTRAOPERATIVE CHOLANGIOGRAM;  Surgeon: Gayland Curry, MD;  Location: [facility];  Service: General;  Laterality: N/A;  laparoscopic cholecystectomy with intraoperative cholangiogram  . CHONDROPLASTY Right 09/19/2015   Procedure: CHONDROPLASTY;  Surgeon: Dorna Leitz, MD;  Location: [facility];  Service: Orthopedics;  Laterality: Right;  . KNEE  ARTHROSCOPY WITH LATERAL MENISECTOMY Right 09/19/2015   Procedure: KNEE ARTHROSCOPY WITH PARTIAL LATERAL MENISECTOMY;  Surgeon: Dorna Leitz, MD;  Location: [facility];  Service: Orthopedics;  Laterality: Right;  . KNEE ARTHROSCOPY WITH MEDIAL MENISECTOMY Right 09/19/2015   Procedure: KNEE ARTHROSCOPY WITH PARTIAL MEDIAL MENISECTOMY;  Surgeon: Dorna Leitz, MD;  Location: [facility];  Service: Orthopedics;  Laterality: Right;  . spider bite     black widow or brown recluse    Current Facility-Administered Medications  Medication Dose Route Frequency Provider Last Rate Last Dose  . bupivacaine liposome (EXPAREL) 1.3 % injection 266 mg  20 mL Infiltration Once Dorna Leitz, MD      . ceFAZolin (ANCEF) IVPB 2g/100 mL premix  2 g Intravenous On Call to OR Frederik Pear, MD      . chlorhexidine (HIBICLENS) 4 % liquid 4 application  60 mL Topical Once Frederik Pear, MD      . fentaNYL (SUBLIMAZE) injection 50-100 mcg  50-100 mcg Intravenous Cheri Rous, MD      . lactated ringers infusion   Intravenous Continuous Frederik Pear, MD 50 mL/hr at 03/31/18 1115    . midazolam (VERSED) injection 1-2 mg  1-2 mg Intravenous Cheri Rous, MD      . tranexamic acid (CYKLOKAPRON) 1,000 mg in sodium chloride 0.9 % 100 mL IVPB  1,000 mg Intravenous To OR Dorna Leitz, MD       Allergies  Allergen Reactions  . Ibuprofen Nausea And Vomiting    High doses make him vomit.    Social History   Tobacco Use  . Smoking status: Never Smoker  . Smokeless tobacco: Never Used  Substance Use Topics  . Alcohol use: Yes  Alcohol/week: 0.6 oz    Types: 1 Cans of beer per week    Family History  Problem Relation Age of Onset  . Cancer Mother        breast  . Heart disease Mother      ROS ROS: I have reviewed the patient's review of systems thoroughly and there are no positive responses as relates to the HPI. Objective:  Physical Exam  Vital signs in last 24 hours: Temp:   [98 F (36.7 C)] 98 F (36.7 C) (05/15 1110) Pulse Rate:  [62] 62 (05/15 1110) Resp:  [18] 18 (05/15 1110) BP: (156)/(84) 156/84 (05/15 1110) SpO2:  [98 %] 98 % (05/15 1110) Weight:  [117 kg (258 lb)] 117 kg (258 lb) (05/15 1106) Well-developed well-nourished patient in no acute distress. Alert and oriented x3 HEENT:within normal limits Cardiac: Regular rate and rhythm Pulmonary: Lungs clear to auscultation Abdomen: Soft and nontender.  Normal active bowel sounds  Musculoskeletal: (r knee: painful rom limited rom mild varus NVI distally  Labs:  Recent Results (from the past 2160 hour(s))  Surgical pcr screen     Status: None   Collection Time: 03/24/18 11:12 AM  Result Value Ref Range   MRSA, PCR NEGATIVE NEGATIVE   Staphylococcus aureus NEGATIVE NEGATIVE    Comment: (NOTE) The Xpert SA Assay (FDA approved for NASAL specimens in patients 77 years of age and older), is one component of a comprehensive surveillance program. It is not intended to diagnose infection nor to guide or monitor treatment. Performed at [facility], [place] [address], [place], [place] [address]   Urinalysis, Routine w reflex microscopic     Status: None   Collection Time: 03/24/18 11:13 AM  Result Value Ref Range   Color, Urine YELLOW YELLOW   APPearance CLEAR CLEAR   Specific Gravity, Urine 1.008 1.005 - 1.030   pH 6.0 5.0 - 8.0   Glucose, UA NEGATIVE NEGATIVE mg/dL   Hgb urine dipstick NEGATIVE NEGATIVE   Bilirubin Urine NEGATIVE NEGATIVE   Ketones, ur NEGATIVE NEGATIVE mg/dL   Protein, ur NEGATIVE NEGATIVE mg/dL   Nitrite NEGATIVE NEGATIVE   Leukocytes, UA NEGATIVE NEGATIVE    Comment: Performed at [facility] [address], [place], [place] [address]  ABO/Rh     Status: None   Collection Time: 03/24/18 11:34 AM  Result Value Ref Range   ABO/RH(D)      Jenetta Downer NEG Performed at [facility] [address], [place], [place] [address]    APTT     Status: None   Collection Time: 03/24/18 11:35 AM  Result Value Ref Range   aPTT 31 24 - 36 seconds    Comment: Performed at [facility], [place] [address], [place], [place] [address]  Basic metabolic panel     Status: Abnormal   Collection Time: 03/24/18 11:35 AM  Result Value Ref Range   Sodium 140 135 - 145 mmol/L   Potassium 3.7 3.5 - 5.1 mmol/L   Chloride 106 101 - 111 mmol/L   CO2 25 22 - 32 mmol/L   Glucose, Bld 102 (H) 65 - 99 mg/dL   BUN 10 6 - 20 mg/dL   Creatinine, Ser 1.05 0.61 - 1.24 mg/dL   Calcium 8.6 (L) 8.9 - 10.3 mg/dL   GFR calc non Af Amer >60 >60 mL/min   GFR calc Af Amer >60 >60 mL/min    Comment: (NOTE) The eGFR has been calculated using the  CKD EPI equation. This calculation has not been validated in all clinical situations. eGFR's persistently <60 mL/min signify possible Chronic Kidney Disease.    Anion gap 9 5 - 15    Comment: Performed at [facility], [place] [address], [place], [place] [address]  CBC WITH DIFFERENTIAL     Status: None   Collection Time: 03/24/18 11:35 AM  Result Value Ref Range   WBC 8.4 4.0 - 10.5 K/uL   RBC 4.46 4.22 - 5.81 MIL/uL   Hemoglobin 14.1 13.0 - 17.0 g/dL   HCT 40.1 39.0 - 52.0 %   MCV 89.9 78.0 - 100.0 fL   MCH 31.6 26.0 - 34.0 pg   MCHC 35.2 30.0 - 36.0 g/dL   RDW 12.5 11.5 - 15.5 %   Platelets 195 150 - 400 K/uL   Neutrophils Relative % 47 %   Neutro Abs 4.0 1.7 - 7.7 K/uL   Lymphocytes Relative 38 %   Lymphs Abs 3.1 0.7 - 4.0 K/uL   Monocytes Relative 9 %   Monocytes Absolute 0.8 0.1 - 1.0 K/uL   Eosinophils Relative 5 %   Eosinophils Absolute 0.4 0.0 - 0.7 K/uL   Basophils Relative 1 %   Basophils Absolute 0.1 0.0 - 0.1 K/uL    Comment: Performed at [facility], [place] [address] [place], [place] [address]  Protime-INR     Status: None   Collection Time: 03/24/18 11:35 AM  Result Value Ref Range   Prothrombin Time 13.6 11.4 - 15.2 seconds    INR 1.05     Comment: Performed at [facility], [place] [address], [place], [place] [address]  Type and screen Order type and screen if day of surgery is less than 15 days from draw of preadmission visit or order morning of surgery if day of surgery is greater than 6 days from preadmission visit.     Status: None   Collection Time: 03/24/18 11:35 AM  Result Value Ref Range   ABO/RH(D) O NEG    Antibody Screen NEG    Sample Expiration 04/03/2018    Extend sample reason      NO TRANSFUSIONS OR PREGNANCY IN THE PAST 3 MONTHS Performed at [facility], [place] [address], [place], [place] [address]    Estimated body mass index is 37.02 kg/m as calculated from the following:   Height as of this encounter: 5' 10"  (1.778 m).   Weight as of this encounter: 117 kg (258 lb).   Imaging Review Plain radiographs demonstrate severe degenerative joint disease of the right knee(s). The overall alignment ismild varus. The bone quality appears to be good for age and reported activity level.   Preoperative templating of the joint replacement has been completed, documented, and submitted to the Operating Room personnel in order to optimize intra-operative equipment management.    Patient's anticipated LOS is less than 2 midnights, meeting these requirements: - Younger than 1 - Lives within 1 hour of care - Has a competent adult at home to recover with post-op recover - NO history of  - Chronic pain requiring opiods  - Diabetes  - Coronary Artery Disease  - Heart failure  - Heart attack  - Stroke  - DVT/VTE  - Cardiac arrhythmia  - Respiratory Failure/COPD  - Renal failure  - Anemia  - Advanced Liver disease        Assessment/Plan:  End stage arthritis, right knee   The patient history, physical examination,  clinical judgment of the provider and imaging studies are consistent with end stage degenerative joint disease of the right knee(s) and total  knee arthroplasty is deemed medically necessary. The treatment options including medical management, injection therapy arthroscopy and arthroplasty were discussed at length. The risks and benefits of total knee arthroplasty were presented and reviewed. The risks due to aseptic loosening, infection, stiffness, patella tracking problems, thromboembolic complications and other imponderables were discussed. The patient acknowledged the explanation, agreed to proceed with the plan and consent was signed. Patient is being admitted for inpatient treatment for surgery, pain control, PT, OT, prophylactic antibiotics, VTE prophylaxis, progressive ambulation and ADL's and discharge planning. The patient is planning to be discharged home with home health services

## 2018-03-31 NOTE — Anesthesia Postprocedure Evaluation (Signed)
Anesthesia Post Note  Patient: Reginald [name]  Procedure(s) Performed: RIGHT TOTAL KNEE ARTHROPLASTY (Right Knee)     Patient location during evaluation: PACU Anesthesia Type: Spinal Level of consciousness: awake and alert Pain management: pain level controlled Vital Signs Assessment: post-procedure vital signs reviewed and stable Respiratory status: spontaneous breathing and respiratory function stable Cardiovascular status: blood pressure returned to baseline and stable Postop Assessment: spinal receding Anesthetic complications: no    Last Vitals:  Vitals:   03/31/18 1758 03/31/18 1800  BP: (!) 145/84 132/86  Pulse: 72 69  Resp: 14   Temp: 36.7 C   SpO2: 96%     Last Pain:  Vitals:   03/31/18 1846  TempSrc:   PainSc: 2                  Lewie Loron

## 2018-03-31 NOTE — Anesthesia Preprocedure Evaluation (Addendum)
Anesthesia Evaluation  Patient identified by MRN, date of birth, ID band Patient awake    Reviewed: Allergy & Precautions, NPO status , Patient's Chart, lab work & pertinent test results  Airway Mallampati: II  TM Distance: >3 FB Neck ROM: Full    Dental  (+) Teeth Intact   Pulmonary neg pulmonary ROS,    breath sounds clear to auscultation       Cardiovascular hypertension, Pt. on medications  Rhythm:Regular Rate:Normal     Neuro/Psych  Headaches, negative psych ROS   GI/Hepatic negative GI ROS, Neg liver ROS,   Endo/Other  negative endocrine ROS  Renal/GU negative Renal ROS     Musculoskeletal  (+) Arthritis ,   Abdominal   Peds  Hematology negative hematology ROS (+)   Anesthesia Other Findings   Reproductive/Obstetrics negative OB ROS                             Lab Results  Component Value Date   WBC 8.4 03/24/2018   HGB 14.1 03/24/2018   HCT 40.1 03/24/2018   MCV 89.9 03/24/2018   PLT 195 03/24/2018   Lab Results  Component Value Date   CREATININE 1.05 03/24/2018   BUN 10 03/24/2018   NA 140 03/24/2018   K 3.7 03/24/2018   CL 106 03/24/2018   CO2 25 03/24/2018   Lab Results  Component Value Date   INR 1.05 03/24/2018    EKG: normal sinus rhythm.  Anesthesia Physical  Anesthesia Plan  ASA: II  Anesthesia Plan: Spinal   Post-op Pain Management:  Regional for Post-op pain   Induction: Intravenous  PONV Risk Score and Plan: 2 and Ondansetron, Dexamethasone and Propofol infusion  Airway Management Planned:   Additional Equipment:   Intra-op Plan:   Post-operative Plan:   Informed Consent: I have reviewed the patients History and Physical, chart, labs and discussed the procedure including the risks, benefits and alternatives for the proposed anesthesia with the patient or authorized representative who has indicated his/her understanding and acceptance.    Dental advisory given  Plan Discussed with: CRNA  Anesthesia Plan Comments:         Anesthesia Quick Evaluation

## 2018-04-01 LAB — CBC
HEMATOCRIT: 40.6 % (ref 39.0–52.0)
Hemoglobin: 14 g/dL (ref 13.0–17.0)
MCH: 31.4 pg (ref 26.0–34.0)
MCHC: 34.5 g/dL (ref 30.0–36.0)
MCV: 91 fL (ref 78.0–100.0)
Platelets: 221 10*3/uL (ref 150–400)
RBC: 4.46 MIL/uL (ref 4.22–5.81)
RDW: 12.6 % (ref 11.5–15.5)
WBC: 20.2 10*3/uL — AB (ref 4.0–10.5)

## 2018-04-01 LAB — BASIC METABOLIC PANEL
ANION GAP: 11 (ref 5–15)
BUN: 13 mg/dL (ref 6–20)
CALCIUM: 8.7 mg/dL — AB (ref 8.9–10.3)
CHLORIDE: 105 mmol/L (ref 101–111)
CO2: 21 mmol/L — AB (ref 22–32)
Creatinine, Ser: 1.07 mg/dL (ref 0.61–1.24)
GFR calc Af Amer: >60 mL/min (ref >60)
GFR calc non Af Amer: >60 mL/min (ref >60)
GLUCOSE: 206 mg/dL — AB (ref 65–99)
Potassium: 4.4 mmol/L (ref 3.5–5.1)
Sodium: 137 mmol/L (ref 135–145)

## 2018-04-01 NOTE — Progress Notes (Signed)
Physical Therapy Treatment Patient Details Name: Reginald [name] MRN: [id number] DOB: [birth year] Today's Date: 04/01/2018    History of Present Illness R TKA    PT Comments    Patient is ready for DC.   Follow Up Recommendations  Follow surgeon's recommendation for DC plan and follow-up therapies     Equipment Recommendations  None recommended by PT    Recommendations for Other Services       Precautions / Restrictions Precautions Precautions: Knee;Fall Required Braces or Orthoses: Knee Immobilizer - Right    Mobility  Bed Mobility Overal bed mobility: Needs Assistance Bed Mobility: Supine to Sit     Supine to sit: Min assist     General bed mobility comments: in recliner  Transfers Overall transfer level: Needs assistance Equipment used: Rolling walker (2 wheeled) Transfers: Sit to/from Stand Sit to Stand: Supervision         General transfer comment: cues for hand and right leg position  Ambulation/Gait Ambulation/Gait assistance: Supervision Ambulation Distance (Feet): 100 Feet Assistive device: Rolling walker (2 wheeled) Gait Pattern/deviations: Step-to pattern;Step-through pattern     General Gait Details: cues for sequence   Stairs Stairs: Yes Stairs assistance: Min assist Stair Management: Step to pattern;Forwards;With crutches;One rail Right Number of Stairs: 2 General stair comments: also up 1 step with RW forwards   Wheelchair Mobility    Modified Rankin (Stroke Patients Only)       Balance                                            Cognition Arousal/Alertness: Awake/alert Behavior During Therapy: WFL for tasks assessed/performed Overall Cognitive Status: Within Functional Limits for tasks assessed                                        Exercises Total Joint Exercises Ankle Circles/Pumps: AROM;Both;10 reps Quad Sets: AROM;Both;10 reps Towel Squeeze: AROM;Right;10 reps Heel Slides:  AAROM;Right;10 reps Hip ABduction/ADduction: AAROM;Right;10 reps Straight Leg Raises: AAROM;Right;10 reps Long Arc Quad: AROM;Right;10 reps Knee Flexion: AROM;Right;10 reps    General Comments        Pertinent Vitals/Pain Pain Assessment: 0-10 Pain Score: 6  Pain Location: R ight knee Pain Descriptors / Indicators: Discomfort;Aching Pain Intervention(s): Monitored during session;Patient requesting pain meds-RN notified;Premedicated before session;Ice applied    Home Living Family/patient expects to be discharged to:: Private residence Living Arrangements: Spouse/significant other Available Help at Discharge: Family Type of Home: House Home Access: Stairs to enter   Home Layout: Two level;1/2 bath on main level;Bed/bath upstairs Home Equipment: Environmental consultant - 2 wheels;Crutches      Prior Function Level of Independence: Independent          PT Goals (current goals can now be found in the care plan section) Acute Rehab PT Goals Patient Stated Goal: to go home PT Goal Formulation: With patient/family Time For Goal Achievement: 04/03/18 Potential to Achieve Goals: Good Progress towards PT goals: Progressing toward goals    Frequency    7X/week      PT Plan      Co-evaluation              AM-PAC PT "6 Clicks" Daily Activity  Outcome Measure  Difficulty turning over in bed (including adjusting bedclothes, sheets and blankets)?: A Little  Difficulty moving from lying on back to sitting on the side of the bed? : A Little Difficulty sitting down on and standing up from a chair with arms (e.g., wheelchair, bedside commode, etc,.)?: A Little Help needed moving to and from a bed to chair (including a wheelchair)?: A Little Help needed walking in hospital room?: A Little Help needed climbing 3-5 steps with a railing? : A Little 6 Click Score: 18    End of Session Equipment Utilized During Treatment: Right knee immobilizer Activity Tolerance: Patient tolerated  treatment well Patient left: in chair;with call bell/phone within reach;with family/visitor present Nurse Communication: Mobility status;Patient requests pain meds PT Visit Diagnosis: Unsteadiness on feet (R26.81)     Time: 1351-1415 PT Time Calculation (min) (ACUTE ONLY): 24 min  Charges:  $Gait Training: 8-22 mins $Therapeutic Exercise: 8-22 mins                    G Codes:          Reginald [name] 04/01/2018, 4:22 PM

## 2018-04-01 NOTE — Op Note (Signed)
NAME: Reginald [name], Reginald [name] MEDICAL RECORD YN:[id number] ACCOUNT [id number] DATE OF BIRTH:05/08/1966 FACILITY: [facility] LOCATION: [facility] PHYSICIAN:Quinta Eimer L. Camry Theiss, MD  OPERATIVE REPORT  DATE OF PROCEDURE:  03/31/2018  PREOPERATIVE DIAGNOSES:  End-stage degenerative joint disease, right knee, with severe bone-on-bone change in medial and patellofemoral compartments.  POSTOPERATIVE DIAGNOSES:  End-stage degenerative joint disease, right knee, with severe bone-on-bone change in medial and patellofemoral compartments.  PROCEDURE:  Right total knee replacement with an Attune system, size 6 tibia, size 5 femur, 5 mm bridging bearing, and a 41 mm all polyethylene patella.  SURGEON:  Jodi Geralds, MD  ASSISTANT:  Marshia Ly, PA-C  ANESTHESIA:  Spinal.  BRIEF HISTORY:  The patient is a 52 year old male with a long history of complaints of right knee pain that had been treated conservatively for a prolonged period of time including injection therapy, activity modification, viscosupplementation, and  physical therapy.  After failure of all these options and the patient was continuing to have light-activity pain and night pain and x-rays showing bone-on-bone change, he was taken to the operating room for right total knee replacement.  DESCRIPTION OF PROCEDURE:  The patient was taken to the operating room.  After adequate anesthesia was obtained with a spinal anesthetic, the patient was placed supine on the operating table.  The right leg was prepped and draped in the usual sterile  fashion.  Following this, the leg was exsanguinated.  Blood pressure for tourniquet was inflated to 300 mmHg.  Following this, a midline incision was made.  The subcutaneous tissue was dissected down to the level of the extensor mechanism, and a medial  parapatellar arthrotomy was undertaken.  Once that was undertaken, the medial and lateral meniscus were removed, retropatellar fat pad, synovium on the anterior aspect of the  femur, and the medial and lateral meniscus.  Once these were all removed,  attention was turned towards the knee where an intramedullary pilot hole was drilled.  The anterior and the distal femur were cut with a 4-degree valgus inclination cut.  Following this, the medial and lateral meniscus was removed as well as the anterior  and posterior cruciate ____ and anterior aspect of the femur and the retropatellar fat pad.  Once this was done, attention was turned to the femur, sized to a 5.  Anterior and posterior cuts were made, chamfers and box.  Attention was then turned  towards the tibia where it was cut perpendicular to its long axis with 3 degrees posterior slope.  Attention was then turned towards the sizing, and the tibia sized to a 6.  It was drilled and keeled, and the attention was then turned towards the  patella, which cut down to a level accepting a 41 mm patellar button.  At this point, the trial components were all placed along with a 5 spacer.  The knee was put through a range of motion.  Excellent stability and range of motion was achieved.   Attention was then turned towards the removal of the trial components.  The knee was then copiously and thoroughly lavaged with pulsatile lavage irrigation and suctioned dry.  The final components were then cemented in place.  A size 6 tibia, size 5  femur, 5 mm bridging bearing trial was placed, and a 41 mm all poly patella was placed and held with a clamp.  All excess bone cement was removed.  Cement was allowed to completely harden, and when this was done, the tourniquet was let down.  The trial  poly was  then removed.  The knee was again irrigated, suctioned dry, and the final poly was opened, a size 5, and it was put in place at this point and knee put through range of motion.  It had excellent range of motion and stability at this point.  Once  the final poly had been placed, the knee was then closed with 1 Vicryl running, the skin was closed with  0 and 2-0 Vicryl, and 3-0 Monocryl subcuticular.  Benzoin, Steri-Strips applied.  Sterile compressive dressing was applied, and the patient was  taken to recovery and was noted to be satisfactory condition.  Estimated blood loss for the procedure was minimal.  LN/NUANCE  D:03/31/2018 T:03/31/2018 JOB:[id number]

## 2018-04-01 NOTE — Progress Notes (Signed)
Subjective: 1 Day Post-Op Procedure(s) (LRB): RIGHT TOTAL KNEE ARTHROPLASTY (Right) Patient reports pain as moderate. Taking by mouth and voiding okay.  Wants to go home this afternoon.   Objective: Vital signs in last 24 hours: Temp:  [97.7 F (36.5 C)-99 F (37.2 C)] 97.8 F (36.6 C) (05/16 0930) Pulse Rate:  [55-73] 65 (05/16 0930) Resp:  [13-22] 18 (05/16 0930) BP: (119-156)/(69-95) 144/95 (05/16 0930) SpO2:  [95 %-100 %] 95 % (05/16 0930) Weight:  [117 kg (258 lb)] 117 kg (258 lb) (05/15 1545)  Intake/Output from previous day: 05/15 0701 - 05/16 0700 In: 3953.3 [P.O.:800; I.V.:2843.3; IV Piggyback:310] Out: 2830 [Urine:2780; Blood:50] Intake/Output this shift: Total I/O In: 240 [P.O.:240] Out: 900 [Urine:900]  Recent Labs    04/01/18 0536  HGB 14.0   Recent Labs    04/01/18 0536  WBC 20.2*  RBC 4.46  HCT 40.6  PLT 221   Recent Labs    04/01/18 0536  NA 137  K 4.4  CL 105  CO2 21*  BUN 13  CREATININE 1.07  GLUCOSE 206*  CALCIUM 8.7*   No results for input(s): LABPT, INR in the last 72 hours. Right knee exam: Sensation intact distally Intact pulses distally Dorsiflexion/Plantar flexion intact Incision: dressing C/D/I Compartment soft   Patient's anticipated LOS is less than 2 midnights, meeting these requirements: - Younger than 71 - Lives within 1 hour of care - Has a competent adult at home to recover with post-op recover - NO history of  - Chronic pain requiring opiods  - Diabetes  - Coronary Artery Disease  - Heart failure  - Heart attack  - Stroke  - DVT/VTE  - Cardiac arrhythmia  - Respiratory Failure/COPD  - Renal failure  - Anemia  - Advanced Liver disease      Assessment/Plan: 1 Day Post-Op Procedure(s) (LRB): RIGHT TOTAL KNEE ARTHROPLASTY (Right)  Plan: Aspirin 325 mg twice daily for DVT prophylaxis 1 month postop. Up with therapy Discharge home with home health  Follow-up with Dr. Luiz Blare in 2 weeks.    Matthew Folks 04/01/2018, 10:59 AM

## 2018-04-01 NOTE — Progress Notes (Signed)
Discharge planning, spoke with patient and spouse at bedside. Have chosen [facility] PT, evaluate and treat. [facility] for referral. Has a RW and 3n1. [phone]

## 2018-04-01 NOTE — Evaluation (Signed)
Physical Therapy Evaluation Patient Details Name: Reginald [name] MRN: [id number] DOB: 1966/02/11 Today's Date: 04/01/2018   History of Present Illness  R TKA  Clinical Impression  The patient ambulated x 200'. BP post  Walking 184/103. RN informed.  Plans DC  today possibly.Pt admitted with above diagnosis. Pt currently with functional limitations due to the deficits listed below (see PT Problem List).  Pt will benefit from skilled PT to increase their independence and safety with mobility to allow discharge to the venue listed below.       Follow Up Recommendations Follow surgeon's recommendation for DC plan and follow-up therapies    Equipment Recommendations  None recommended by PT    Recommendations for Other Services       Precautions / Restrictions Precautions Precautions: Knee Required Braces or Orthoses: Knee Immobilizer - Right      Mobility  Bed Mobility Overal bed mobility: Needs Assistance Bed Mobility: Supine to Sit     Supine to sit: Min assist     General bed mobility comments: support right leg  Transfers Overall transfer level: Needs assistance Equipment used: Rolling walker (2 wheeled) Transfers: Sit to/from Stand Sit to Stand: Min assist         General transfer comment: cues for hand and right leg position  Ambulation/Gait Ambulation/Gait assistance: Min assist Ambulation Distance (Feet): 200 Feet Assistive device: Rolling walker (2 wheeled) Gait Pattern/deviations: Step-to pattern;Step-through pattern     General Gait Details: cues for sequence  Stairs            Wheelchair Mobility    Modified Rankin (Stroke Patients Only)       Balance                                             Pertinent Vitals/Pain Pain Assessment: 0-10 Pain Score: 5  Pain Location: R ight knee Pain Descriptors / Indicators: Discomfort;Aching Pain Intervention(s): Monitored during session;Premedicated before  session;Repositioned;Patient requesting pain meds-RN notified;Ice applied    Home Living Family/patient expects to be discharged to:: Private residence Living Arrangements: Spouse/significant other Available Help at Discharge: Family Type of Home: House Home Access: Stairs to enter   Secretary/administrator of Steps: 1 Home Layout: Two level;1/2 bath on main level;Bed/bath upstairs Home Equipment: Environmental consultant - 2 wheels;Crutches      Prior Function Level of Independence: Independent               Hand Dominance        Extremity/Trunk Assessment   Upper Extremity Assessment Upper Extremity Assessment: Overall WFL for tasks assessed    Lower Extremity Assessment Lower Extremity Assessment: RLE deficits/detail RLE Deficits / Details: able to perform SLR, knewe flexion 5-60        Communication   Communication: No difficulties  Cognition Arousal/Alertness: Awake/alert Behavior During Therapy: WFL for tasks assessed/performed Overall Cognitive Status: Within Functional Limits for tasks assessed                                        General Comments      Exercises Total Joint Exercises Ankle Circles/Pumps: AROM;Both;10 reps Quad Sets: AROM;Both;10 reps Towel Squeeze: AROM;Right;10 reps Heel Slides: AAROM;Right;10 reps Hip ABduction/ADduction: AAROM;Right;10 reps Straight Leg Raises: AAROM;Right;10 reps   Assessment/Plan    PT  Assessment Patient needs continued PT services  PT Problem List Decreased strength;Decreased range of motion;Decreased activity tolerance;Decreased mobility;Cardiopulmonary status limiting activity;Decreased knowledge of precautions;Decreased safety awareness;Decreased knowledge of use of DME;Pain       PT Treatment Interventions DME instruction;Therapeutic exercise;Gait training;Stair training;Functional mobility training;Therapeutic activities;Patient/family education    PT Goals (Current goals can be found in the Care  Plan section)  Acute Rehab PT Goals Patient Stated Goal: to go home PT Goal Formulation: With patient/family Time For Goal Achievement: 04/03/18 Potential to Achieve Goals: Good    Frequency 7X/week   Barriers to discharge        Co-evaluation               AM-PAC PT "6 Clicks" Daily Activity  Outcome Measure Difficulty turning over in bed (including adjusting bedclothes, sheets and blankets)?: A Little Difficulty moving from lying on back to sitting on the side of the bed? : A Little Difficulty sitting down on and standing up from a chair with arms (e.g., wheelchair, bedside commode, etc,.)?: A Little Help needed moving to and from a bed to chair (including a wheelchair)?: A Little Help needed walking in hospital room?: A Little Help needed climbing 3-5 steps with a railing? : Total 6 Click Score: 16    End of Session Equipment Utilized During Treatment: Right knee immobilizer Activity Tolerance: Patient tolerated treatment well Patient left: in chair;with call bell/phone within reach;with family/visitor present Nurse Communication: Mobility status;Patient requests pain meds(BP 184/103) PT Visit Diagnosis: Unsteadiness on feet (R26.81)    Time: 1610-9604 PT Time Calculation (min) (ACUTE ONLY): 40 min   Charges:   PT Evaluation $PT Eval Low Complexity: 1 Low PT Treatments $Gait Training: 8-22 mins $Therapeutic Exercise: 8-22 mins   PT G CodesBlanchard Kelch PT [phone]   [name] 04/01/2018, 12:56 PM

## 2018-04-01 NOTE — Discharge Summary (Signed)
Patient ID: Reginald [name] MRN: [id number] DOB/AGE: [birth year] 53 y.o.  Admit date: 03/31/2018 Discharge date: 04/01/2018  Admission Diagnoses:  Principal Problem:   Primary osteoarthritis of right knee   Discharge Diagnoses:  Same  Past Medical History:  Diagnosis Date  . Arthritis   . Headache(784.0)   . Hypertension     Surgeries: Procedure(s): RIGHT TOTAL KNEE ARTHROPLASTY on 03/31/2018   Consultants:   Discharged Condition: Improved  Hospital Course: Reginald [name] is an 52 y.o. male who was admitted 03/31/2018 for operative treatment ofPrimary osteoarthritis of right knee. Patient has severe unremitting pain that affects sleep, daily activities, and work/hobbies. After pre-op clearance the patient was taken to the operating room on 03/31/2018 and underwent  Procedure(s): RIGHT TOTAL KNEE ARTHROPLASTY.    Patient was given perioperative antibiotics:  Anti-infectives (From admission, onward)   Start     Dose/Rate Route Frequency Ordered Stop   03/31/18 1830  ceFAZolin (ANCEF) IVPB 2g/100 mL premix     2 g 200 mL/hr over 30 Minutes Intravenous Every 6 hours 03/31/18 1552 04/01/18 0100   03/31/18 1100  ceFAZolin (ANCEF) IVPB 2g/100 mL premix     2 g 200 mL/hr over 30 Minutes Intravenous On call to O.R. 03/31/18 1045 03/31/18 1247       Patient was given sequential compression devices, early ambulation, and chemoprophylaxis to prevent DVT.  Patient benefited maximally from hospital stay and there were no complications.    Recent vital signs:  Patient Vitals for the past 24 hrs:  BP Temp Temp src Pulse Resp SpO2 Height Weight  04/01/18 0930 (!) 144/95 97.8 F (36.6 C) Oral 65 18 95 % - -  04/01/18 0615 138/70 98.2 F (36.8 C) Oral 61 17 95 % - -  04/01/18 0235 140/81 98.6 F (37 C) Oral 68 17 96 % - -  03/31/18 2157 (!) 142/81 98.7 F (37.1 C) Oral 73 - 98 % - -  03/31/18 1800 132/86 - - 69 - - - -  03/31/18 1758 (!) 145/84 98 F (36.7 C) Oral 72 14 96 % - -   03/31/18 1646 126/79 99 F (37.2 C) Oral 64 16 98 % - -  03/31/18 1545 122/80 98.1 F (36.7 C) Oral (!) 55 16 97 %  (1.778 m) 117 kg (258 lb)  03/31/18 1525 119/76 97.7 F (36.5 C) - 62 14 95 % - -  03/31/18 1515 125/74 - - (!) 59 19 96 % - -  03/31/18 1500 121/72 - - (!) 58 17 95 % - -  03/31/18 1445 124/75 - - (!) 58 (!) 22 100 % - -  03/31/18 1440 127/69 98.3 F (36.8 C) - (!) 59 20 98 % - -  03/31/18 1235 - - - (!) 56 14 99 % - -  03/31/18 1230 (!) 145/85 - - (!) 55 18 98 % - -  03/31/18 1225 (!) 141/86 - - (!) 57 13 98 % - -  03/31/18 1222 - - - 60 13 100 % - -  03/31/18 1220 (!) 155/90 - - 68 13 96 % - -  03/31/18 1110 (!) 156/84 98 F (36.7 C) Oral 62 18 98 % - -     Recent laboratory studies:  Recent Labs    04/01/18 0536  WBC 20.2*  HGB 14.0  HCT 40.6  PLT 221  NA 137  K 4.4  CL 105  CO2 21*  BUN 13  CREATININE 1.07  GLUCOSE 206*  CALCIUM 8.7*     Discharge Medications:   Allergies as of 04/01/2018      Reactions   Ibuprofen Nausea And Vomiting   High doses make him vomit.      Medication List    TAKE these medications   amLODipine 10 MG tablet Commonly known as:  NORVASC Take 10 mg by mouth daily.   aspirin EC 325 MG tablet Take 1 tablet (325 mg total) by mouth 2 (two) times daily after a meal. Take x 1 month post op to decrease risk of blood clots.   docusate sodium 100 MG capsule Commonly known as:  COLACE Take 1 capsule (100 mg total) by mouth 2 (two) times daily.   losartan-hydrochlorothiazide 50-12.5 MG tablet Commonly known as:  HYZAAR Take 1 tablet by mouth daily.   oxyCODONE-acetaminophen 5-325 MG tablet Commonly known as:  PERCOCET/ROXICET Take 1-2 tablets by mouth every 6 (six) hours as needed for severe pain.   tiZANidine 2 MG tablet Commonly known as:  ZANAFLEX Take 1 tablet (2 mg total) by mouth every 8 (eight) hours as needed for muscle spasms.            Discharge Care Instructions  (From admission, onward)         Start     Ordered   04/01/18 0000  Weight bearing as tolerated    Question Answer Comment  Laterality right   Extremity Lower      04/01/18 1106      Diagnostic Studies: Dg Chest 2 View  Result Date: 03/24/2018 CLINICAL DATA:  Preop evaluation, hypertension EXAM: CHEST - 2 VIEW COMPARISON:  05/18/2016 FINDINGS: The heart size and mediastinal contours are within normal limits. Both lungs are clear. The visualized skeletal structures are unremarkable. IMPRESSION: No active cardiopulmonary disease. Electronically Signed   By: Judie Petit.  Shick M.D.   On: 03/24/2018 16:46    Disposition: Discharge disposition: 01-Home or Self Care       Discharge Instructions    CPM   Complete by:  As directed    Continuous passive motion machine (CPM):      Use the CPM from 0 to 60 for 8 hours per day.      You may increase by 5-10 per day.  You may break it up into 2 or 3 sessions per day.      Use CPM for 1-2 weeks or until you are told to stop.   Call MD / Call 911   Complete by:  As directed    If you experience chest pain or shortness of breath, CALL 911 and be transported to the hospital emergency room.  If you develope a fever above 101 F, pus (white drainage) or increased drainage or redness at the wound, or calf pain, call your surgeon's office.   Constipation Prevention   Complete by:  As directed    Drink plenty of fluids.  Prune juice may be helpful.  You may use a stool softener, such as Colace (over the counter) 100 mg twice a day.  Use MiraLax (over the counter) for constipation as needed.   Diet general   Complete by:  As directed    Do not put a pillow under the knee. Place it under the heel.   Complete by:  As directed    Increase activity slowly as tolerated   Complete by:  As directed    Weight bearing as tolerated   Complete by:  As directed    Laterality:  right   Extremity:  Lower      Follow-up Information    Jodi Geralds, MD. Schedule an appointment as soon  as possible for a visit in 2 weeks.   Specialty:  Orthopedic Surgery Contact information: [address] [place] [address] [phone]            Signed: Matthew Folks 04/01/2018, 11:06 AM

## 2018-04-03 DIAGNOSIS — Z471 Aftercare following joint replacement surgery: Secondary | ICD-10-CM | POA: Diagnosis not present

## 2018-04-03 DIAGNOSIS — I10 Essential (primary) hypertension: Secondary | ICD-10-CM | POA: Diagnosis not present

## 2018-04-07 DIAGNOSIS — I10 Essential (primary) hypertension: Secondary | ICD-10-CM | POA: Diagnosis not present

## 2018-04-07 DIAGNOSIS — Z471 Aftercare following joint replacement surgery: Secondary | ICD-10-CM | POA: Diagnosis not present

## 2018-04-08 DIAGNOSIS — I10 Essential (primary) hypertension: Secondary | ICD-10-CM | POA: Diagnosis not present

## 2018-04-08 DIAGNOSIS — Z471 Aftercare following joint replacement surgery: Secondary | ICD-10-CM | POA: Diagnosis not present

## 2018-04-09 DIAGNOSIS — I10 Essential (primary) hypertension: Secondary | ICD-10-CM | POA: Diagnosis not present

## 2018-04-09 DIAGNOSIS — Z471 Aftercare following joint replacement surgery: Secondary | ICD-10-CM | POA: Diagnosis not present

## 2018-04-13 DIAGNOSIS — M1711 Unilateral primary osteoarthritis, right knee: Secondary | ICD-10-CM | POA: Diagnosis not present

## 2018-04-13 DIAGNOSIS — Z471 Aftercare following joint replacement surgery: Secondary | ICD-10-CM | POA: Diagnosis not present

## 2018-04-13 DIAGNOSIS — Z96651 Presence of right artificial knee joint: Secondary | ICD-10-CM | POA: Diagnosis not present

## 2018-04-14 DIAGNOSIS — Z471 Aftercare following joint replacement surgery: Secondary | ICD-10-CM | POA: Diagnosis not present

## 2018-04-14 DIAGNOSIS — I10 Essential (primary) hypertension: Secondary | ICD-10-CM | POA: Diagnosis not present

## 2018-04-15 DIAGNOSIS — Z96651 Presence of right artificial knee joint: Secondary | ICD-10-CM | POA: Diagnosis not present

## 2018-04-15 DIAGNOSIS — M25561 Pain in right knee: Secondary | ICD-10-CM | POA: Diagnosis not present

## 2018-04-15 DIAGNOSIS — M25661 Stiffness of right knee, not elsewhere classified: Secondary | ICD-10-CM | POA: Diagnosis not present

## 2018-04-16 DIAGNOSIS — M25561 Pain in right knee: Secondary | ICD-10-CM | POA: Diagnosis not present

## 2018-04-16 DIAGNOSIS — M25661 Stiffness of right knee, not elsewhere classified: Secondary | ICD-10-CM | POA: Diagnosis not present

## 2018-04-16 DIAGNOSIS — Z96651 Presence of right artificial knee joint: Secondary | ICD-10-CM | POA: Diagnosis not present

## 2018-04-19 DIAGNOSIS — Z96651 Presence of right artificial knee joint: Secondary | ICD-10-CM | POA: Diagnosis not present

## 2018-04-19 DIAGNOSIS — M25661 Stiffness of right knee, not elsewhere classified: Secondary | ICD-10-CM | POA: Diagnosis not present

## 2018-04-19 DIAGNOSIS — M25561 Pain in right knee: Secondary | ICD-10-CM | POA: Diagnosis not present

## 2018-04-21 DIAGNOSIS — M25561 Pain in right knee: Secondary | ICD-10-CM | POA: Diagnosis not present

## 2018-04-21 DIAGNOSIS — Z96651 Presence of right artificial knee joint: Secondary | ICD-10-CM | POA: Diagnosis not present

## 2018-04-21 DIAGNOSIS — M25661 Stiffness of right knee, not elsewhere classified: Secondary | ICD-10-CM | POA: Diagnosis not present

## 2018-04-26 DIAGNOSIS — M25661 Stiffness of right knee, not elsewhere classified: Secondary | ICD-10-CM | POA: Diagnosis not present

## 2018-04-26 DIAGNOSIS — M25561 Pain in right knee: Secondary | ICD-10-CM | POA: Diagnosis not present

## 2018-04-26 DIAGNOSIS — Z96651 Presence of right artificial knee joint: Secondary | ICD-10-CM | POA: Diagnosis not present

## 2018-04-28 DIAGNOSIS — Z96651 Presence of right artificial knee joint: Secondary | ICD-10-CM | POA: Diagnosis not present

## 2018-04-28 DIAGNOSIS — M25561 Pain in right knee: Secondary | ICD-10-CM | POA: Diagnosis not present

## 2018-04-28 DIAGNOSIS — M25661 Stiffness of right knee, not elsewhere classified: Secondary | ICD-10-CM | POA: Diagnosis not present

## 2018-05-03 DIAGNOSIS — M25661 Stiffness of right knee, not elsewhere classified: Secondary | ICD-10-CM | POA: Diagnosis not present

## 2018-05-03 DIAGNOSIS — M25561 Pain in right knee: Secondary | ICD-10-CM | POA: Diagnosis not present

## 2018-05-03 DIAGNOSIS — Z96651 Presence of right artificial knee joint: Secondary | ICD-10-CM | POA: Diagnosis not present

## 2018-05-06 DIAGNOSIS — Z96651 Presence of right artificial knee joint: Secondary | ICD-10-CM | POA: Diagnosis not present

## 2018-05-06 DIAGNOSIS — M25661 Stiffness of right knee, not elsewhere classified: Secondary | ICD-10-CM | POA: Diagnosis not present

## 2018-05-06 DIAGNOSIS — M25561 Pain in right knee: Secondary | ICD-10-CM | POA: Diagnosis not present

## 2018-05-10 DIAGNOSIS — M25661 Stiffness of right knee, not elsewhere classified: Secondary | ICD-10-CM | POA: Diagnosis not present

## 2018-05-10 DIAGNOSIS — Z96651 Presence of right artificial knee joint: Secondary | ICD-10-CM | POA: Diagnosis not present

## 2018-05-10 DIAGNOSIS — M25561 Pain in right knee: Secondary | ICD-10-CM | POA: Diagnosis not present

## 2018-05-12 DIAGNOSIS — Z96651 Presence of right artificial knee joint: Secondary | ICD-10-CM | POA: Diagnosis not present

## 2018-05-12 DIAGNOSIS — M25661 Stiffness of right knee, not elsewhere classified: Secondary | ICD-10-CM | POA: Diagnosis not present

## 2018-05-12 DIAGNOSIS — M25561 Pain in right knee: Secondary | ICD-10-CM | POA: Diagnosis not present

## 2018-05-17 DIAGNOSIS — Z96651 Presence of right artificial knee joint: Secondary | ICD-10-CM | POA: Diagnosis not present

## 2018-05-17 DIAGNOSIS — M25661 Stiffness of right knee, not elsewhere classified: Secondary | ICD-10-CM | POA: Diagnosis not present

## 2018-05-17 DIAGNOSIS — M25561 Pain in right knee: Secondary | ICD-10-CM | POA: Diagnosis not present

## 2018-05-19 DIAGNOSIS — Z96651 Presence of right artificial knee joint: Secondary | ICD-10-CM | POA: Diagnosis not present

## 2018-05-19 DIAGNOSIS — M25561 Pain in right knee: Secondary | ICD-10-CM | POA: Diagnosis not present

## 2018-05-19 DIAGNOSIS — M25661 Stiffness of right knee, not elsewhere classified: Secondary | ICD-10-CM | POA: Diagnosis not present

## 2018-05-24 DIAGNOSIS — Z96651 Presence of right artificial knee joint: Secondary | ICD-10-CM | POA: Diagnosis not present

## 2018-05-24 DIAGNOSIS — M25561 Pain in right knee: Secondary | ICD-10-CM | POA: Diagnosis not present

## 2018-05-24 DIAGNOSIS — M25661 Stiffness of right knee, not elsewhere classified: Secondary | ICD-10-CM | POA: Diagnosis not present

## 2018-05-25 DIAGNOSIS — M25561 Pain in right knee: Secondary | ICD-10-CM | POA: Diagnosis not present

## 2018-05-26 DIAGNOSIS — M25561 Pain in right knee: Secondary | ICD-10-CM | POA: Diagnosis not present

## 2018-05-26 DIAGNOSIS — Z96651 Presence of right artificial knee joint: Secondary | ICD-10-CM | POA: Diagnosis not present

## 2018-05-26 DIAGNOSIS — M25661 Stiffness of right knee, not elsewhere classified: Secondary | ICD-10-CM | POA: Diagnosis not present

## 2018-06-07 DIAGNOSIS — Z96651 Presence of right artificial knee joint: Secondary | ICD-10-CM | POA: Diagnosis not present

## 2018-06-07 DIAGNOSIS — M25561 Pain in right knee: Secondary | ICD-10-CM | POA: Diagnosis not present

## 2018-06-07 DIAGNOSIS — M25661 Stiffness of right knee, not elsewhere classified: Secondary | ICD-10-CM | POA: Diagnosis not present

## 2018-06-09 DIAGNOSIS — Z96651 Presence of right artificial knee joint: Secondary | ICD-10-CM | POA: Diagnosis not present

## 2018-06-09 DIAGNOSIS — M25561 Pain in right knee: Secondary | ICD-10-CM | POA: Diagnosis not present

## 2018-06-09 DIAGNOSIS — I10 Essential (primary) hypertension: Secondary | ICD-10-CM | POA: Diagnosis not present

## 2018-06-09 DIAGNOSIS — M25661 Stiffness of right knee, not elsewhere classified: Secondary | ICD-10-CM | POA: Diagnosis not present

## 2018-06-09 DIAGNOSIS — Z1211 Encounter for screening for malignant neoplasm of colon: Secondary | ICD-10-CM | POA: Diagnosis not present

## 2018-06-15 DIAGNOSIS — Z96651 Presence of right artificial knee joint: Secondary | ICD-10-CM | POA: Diagnosis not present

## 2018-06-15 DIAGNOSIS — M25561 Pain in right knee: Secondary | ICD-10-CM | POA: Diagnosis not present

## 2018-06-15 DIAGNOSIS — M25661 Stiffness of right knee, not elsewhere classified: Secondary | ICD-10-CM | POA: Diagnosis not present

## 2018-06-17 DIAGNOSIS — Z96651 Presence of right artificial knee joint: Secondary | ICD-10-CM | POA: Diagnosis not present

## 2018-06-17 DIAGNOSIS — M25561 Pain in right knee: Secondary | ICD-10-CM | POA: Diagnosis not present

## 2018-06-17 DIAGNOSIS — M25661 Stiffness of right knee, not elsewhere classified: Secondary | ICD-10-CM | POA: Diagnosis not present

## 2018-06-24 DIAGNOSIS — M25661 Stiffness of right knee, not elsewhere classified: Secondary | ICD-10-CM | POA: Diagnosis not present

## 2018-06-24 DIAGNOSIS — M25561 Pain in right knee: Secondary | ICD-10-CM | POA: Diagnosis not present

## 2018-06-24 DIAGNOSIS — Z96651 Presence of right artificial knee joint: Secondary | ICD-10-CM | POA: Diagnosis not present

## 2018-06-25 DIAGNOSIS — Z96651 Presence of right artificial knee joint: Secondary | ICD-10-CM | POA: Diagnosis not present

## 2018-06-25 DIAGNOSIS — M25561 Pain in right knee: Secondary | ICD-10-CM | POA: Diagnosis not present

## 2018-06-25 DIAGNOSIS — M25661 Stiffness of right knee, not elsewhere classified: Secondary | ICD-10-CM | POA: Diagnosis not present

## 2018-06-29 ENCOUNTER — Telehealth: Payer: Self-pay | Admitting: *Deleted

## 2018-06-29 ENCOUNTER — Other Ambulatory Visit: Payer: Self-pay | Admitting: *Deleted

## 2018-06-29 DIAGNOSIS — Z96651 Presence of right artificial knee joint: Secondary | ICD-10-CM | POA: Diagnosis not present

## 2018-06-29 DIAGNOSIS — M25561 Pain in right knee: Secondary | ICD-10-CM | POA: Diagnosis not present

## 2018-06-29 DIAGNOSIS — I83811 Varicose veins of right lower extremities with pain: Secondary | ICD-10-CM

## 2018-06-29 DIAGNOSIS — M25661 Stiffness of right knee, not elsewhere classified: Secondary | ICD-10-CM | POA: Diagnosis not present

## 2018-06-29 NOTE — Telephone Encounter (Signed)
Patient called in concerned about swelling in his right leg and the varicose veins around his knee. He had a knee replacement 12 weeks ago but feels the swelling is not coming from the knee. He has swelling and redness in the lower third of his calf. Told him we will schedule a reflux study and visit with a md.

## 2018-07-01 DIAGNOSIS — M25561 Pain in right knee: Secondary | ICD-10-CM | POA: Diagnosis not present

## 2018-07-01 DIAGNOSIS — M25661 Stiffness of right knee, not elsewhere classified: Secondary | ICD-10-CM | POA: Diagnosis not present

## 2018-07-01 DIAGNOSIS — Z96651 Presence of right artificial knee joint: Secondary | ICD-10-CM | POA: Diagnosis not present

## 2018-07-02 ENCOUNTER — Telehealth: Payer: Self-pay | Admitting: *Deleted

## 2018-07-02 NOTE — Telephone Encounter (Signed)
Sched. Appt. LVM mailed letter 08-25-18 [facility] Le reflux f/u MD  1pm ultra sound 2pm MD

## 2018-07-06 DIAGNOSIS — Z96651 Presence of right artificial knee joint: Secondary | ICD-10-CM | POA: Diagnosis not present

## 2018-07-06 DIAGNOSIS — M25661 Stiffness of right knee, not elsewhere classified: Secondary | ICD-10-CM | POA: Diagnosis not present

## 2018-07-06 DIAGNOSIS — M25561 Pain in right knee: Secondary | ICD-10-CM | POA: Diagnosis not present

## 2018-07-07 DIAGNOSIS — M25561 Pain in right knee: Secondary | ICD-10-CM | POA: Diagnosis not present

## 2018-07-07 DIAGNOSIS — Z96651 Presence of right artificial knee joint: Secondary | ICD-10-CM | POA: Diagnosis not present

## 2018-07-07 DIAGNOSIS — M25661 Stiffness of right knee, not elsewhere classified: Secondary | ICD-10-CM | POA: Diagnosis not present

## 2018-07-13 DIAGNOSIS — Z96651 Presence of right artificial knee joint: Secondary | ICD-10-CM | POA: Diagnosis not present

## 2018-07-13 DIAGNOSIS — M25561 Pain in right knee: Secondary | ICD-10-CM | POA: Diagnosis not present

## 2018-07-13 DIAGNOSIS — M25661 Stiffness of right knee, not elsewhere classified: Secondary | ICD-10-CM | POA: Diagnosis not present

## 2018-07-16 DIAGNOSIS — Z96651 Presence of right artificial knee joint: Secondary | ICD-10-CM | POA: Diagnosis not present

## 2018-07-16 DIAGNOSIS — M25561 Pain in right knee: Secondary | ICD-10-CM | POA: Diagnosis not present

## 2018-07-16 DIAGNOSIS — M25661 Stiffness of right knee, not elsewhere classified: Secondary | ICD-10-CM | POA: Diagnosis not present

## 2018-07-21 DIAGNOSIS — M25661 Stiffness of right knee, not elsewhere classified: Secondary | ICD-10-CM | POA: Diagnosis not present

## 2018-07-21 DIAGNOSIS — M25561 Pain in right knee: Secondary | ICD-10-CM | POA: Diagnosis not present

## 2018-07-21 DIAGNOSIS — Z96651 Presence of right artificial knee joint: Secondary | ICD-10-CM | POA: Diagnosis not present

## 2018-07-22 DIAGNOSIS — M25561 Pain in right knee: Secondary | ICD-10-CM | POA: Diagnosis not present

## 2018-07-22 DIAGNOSIS — M76891 Other specified enthesopathies of right lower limb, excluding foot: Secondary | ICD-10-CM | POA: Diagnosis not present

## 2018-07-22 DIAGNOSIS — Z96651 Presence of right artificial knee joint: Secondary | ICD-10-CM | POA: Diagnosis not present

## 2018-07-22 DIAGNOSIS — M25661 Stiffness of right knee, not elsewhere classified: Secondary | ICD-10-CM | POA: Diagnosis not present

## 2018-07-23 DIAGNOSIS — M76891 Other specified enthesopathies of right lower limb, excluding foot: Secondary | ICD-10-CM | POA: Diagnosis not present

## 2018-07-23 DIAGNOSIS — M25561 Pain in right knee: Secondary | ICD-10-CM | POA: Diagnosis not present

## 2018-08-18 ENCOUNTER — Encounter: Payer: 59 | Admitting: Vascular Surgery

## 2018-08-18 ENCOUNTER — Encounter (HOSPITAL_COMMUNITY): Payer: 59

## 2018-08-25 ENCOUNTER — Encounter: Payer: Self-pay | Admitting: Vascular Surgery

## 2018-08-25 ENCOUNTER — Ambulatory Visit (HOSPITAL_COMMUNITY)
Admission: RE | Admit: 2018-08-25 | Discharge: 2018-08-25 | Disposition: A | Discharge status: Home / Self Care | Payer: 59 | Source: Ambulatory Visit | Attending: Vascular Surgery | Admitting: Vascular Surgery

## 2018-08-25 ENCOUNTER — Ambulatory Visit (INDEPENDENT_AMBULATORY_CARE_PROVIDER_SITE_OTHER): Payer: 59 | Admitting: Vascular Surgery

## 2018-08-25 VITALS — BP 148/80 | HR 63 | Temp 98.0°F | Resp 16 | Ht 70.0 in | Wt 255.0 lb

## 2018-08-25 DIAGNOSIS — I872 Venous insufficiency (chronic) (peripheral): Secondary | ICD-10-CM | POA: Diagnosis not present

## 2018-08-25 DIAGNOSIS — I83811 Varicose veins of right lower extremities with pain: Secondary | ICD-10-CM | POA: Insufficient documentation

## 2018-08-25 NOTE — Progress Notes (Signed)
REASON FOR CONSULT:    Chronic venous insufficiency.   HPI:   Reginald [name] is a pleasant 52 y.o. male, who presents with painful varicose veins on his right lower extremity.  The patient has had a long history of varicose veins of the right lower extremity.  He had a total knee replacement in March of this year and has had significant right lower extremity swelling.  He experiences aching pain heaviness and throbbing in his legs which is aggravated by standing and sitting and relieved somewhat with elevation.  He has worn knee-high and thigh-high compression stockings with a mild gradient.  He denies any previous history of DVT or phlebitis.  He does not tolerate ibuprofen for pain.  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  Past Medical History:  Diagnosis Date  . Arthritis   . Headache(784.0)   . Hypertension     Family History  Problem Relation Age of Onset  . Cancer Mother        breast  . Heart disease Mother     SOCIAL HISTORY: Social History   Socioeconomic History  . Marital status: Married    Spouse name: Not on file  . Number of children: Not on file  . Years of education: Not on file  . Highest education level: Not on file  Occupational History  . Not on file  Social Needs  . Financial resource strain: Not on file  . Food insecurity:    Worry: Not on file    Inability: Not on file  . Transportation needs:    Medical: Not on file    Non-medical: Not on file  Tobacco Use  . Smoking status: Never Smoker  . Smokeless tobacco: Never Used  Substance and Sexual Activity  . Alcohol use: Yes    Alcohol/week: 1.0 standard drinks    Types: 1 Cans of beer per week  . Drug use: No  . Sexual activity: Not on file  Lifestyle  . Physical activity:    Days per week: Not on file    Minutes per session: Not on file  . Stress: Not on file  Relationships  . Social connections:    Talks on phone: Not on file    Gets together: Not on  file    Attends religious service: Not on file    Active member of club or organization: Not on file    Attends meetings of clubs or organizations: Not on file    Relationship status: Not on file  . Intimate partner violence:    Fear of current or ex partner: Not on file    Emotionally abused: Not on file    Physically abused: Not on file    Forced sexual activity: Not on file  Other Topics Concern  . Not on file  Social History Narrative  . Not on file    Allergies  Allergen Reactions  . Ibuprofen Nausea And Vomiting    High doses make him vomit.    Current Outpatient Medications  Medication Sig Dispense Refill  . amLODipine (NORVASC) 10 MG tablet Take 10 mg by mouth daily.     Marland Kitchen aspirin EC 325 MG tablet Take 1 tablet (325 mg total) by mouth 2 (two) times daily after a meal. Take x 1 month post op to decrease risk of blood clots. (Patient taking differently: Take 81 mg by mouth daily. Take x 1 month post op to decrease risk  of blood clots.) 60 tablet 0  . docusate sodium (COLACE) 100 MG capsule Take 1 capsule (100 mg total) by mouth 2 (two) times daily. 30 capsule 0  . losartan-hydrochlorothiazide (HYZAAR) 50-12.5 MG tablet Take 1 tablet by mouth daily.    Marland Kitchen oxyCODONE-acetaminophen (PERCOCET/ROXICET) 5-325 MG tablet Take 1-2 tablets by mouth every 6 (six) hours as needed for severe pain. (Patient not taking: Reported on 08/25/2018) 60 tablet 0  . tiZANidine (ZANAFLEX) 2 MG tablet Take 1 tablet (2 mg total) by mouth every 8 (eight) hours as needed for muscle spasms. (Patient not taking: Reported on 08/25/2018) 50 tablet 0   No current facility-administered medications for this visit.     REVIEW OF SYSTEMS:  [X]  denotes positive finding, [ ]  denotes negative finding Cardiac  Comments:  Chest pain or chest pressure:    Shortness of breath upon exertion:    Short of breath when lying flat:    Irregular heart rhythm:        Vascular    Pain in calf, thigh, or hip brought on by  ambulation:    Pain in feet at night that wakes you up from your sleep:     Blood clot in your veins:    Leg swelling:  x       Pulmonary    Oxygen at home:    Productive cough:     Wheezing:         Neurologic    Sudden weakness in arms or legs:     Sudden numbness in arms or legs:     Sudden onset of difficulty speaking or slurred speech:    Temporary loss of vision in one eye:     Problems with dizziness:         Gastrointestinal    Blood in stool:     Vomited blood:         Genitourinary    Burning when urinating:     Blood in urine:        Psychiatric    Major depression:         Hematologic    Bleeding problems:    Problems with blood clotting too easily:        Skin    Rashes or ulcers:        Constitutional    Fever or chills:     PHYSICAL EXAM:   Vitals:   08/25/18 1338 08/25/18 1339  BP: (!) 150/81 (!) 148/80  Pulse: 63   Resp: 16   Temp: 98 F (36.7 C)   SpO2: 98%   Weight: 255 lb (115.7 kg)   Height: 5\' 10"  (1.778 m)     GENERAL: The patient is a well-nourished male, in no acute distress. The vital signs are documented above. CARDIAC: There is a regular rate and rhythm.  VASCULAR: I do not detect carotid bruits. He has palpable femoral and dorsalis pedis pulses bilaterally. He has bilateral lower extremity swelling which is more significant on the right side.  He has large varicose veins along the medial aspect of his distal right thigh and proximal right calf.  These veins are under significantly elevated pressure. I did look at the great saphenous vein on the right with the SonoSite myself in the office today and the vein is significantly enlarged especially in the proximal thigh.  There is reflux throughout the entire saphenous vein to the distal thigh where it feeds a large cluster of varicose veins.  The saphenofemoral  junction can be clearly identified.  I would likely need to cannulate the vein in the distal thigh just above the takeoff of  these large varicose veins. PULMONARY: There is good air exchange bilaterally without wheezing or rales. ABDOMEN: Soft and non-tender with normal pitched bowel sounds.  MUSCULOSKELETAL: There are no major deformities or cyanosis. NEUROLOGIC: No focal weakness or paresthesias are detected. SKIN: There are no ulcers or rashes noted. PSYCHIATRIC: The patient has a normal affect.  DATA:    VENOUS DUPLEX: I have independently interpreted his venous duplex of the right lower extremity.  He has no evidence of DVT or superficial thrombophlebitis on the right.  He has deep venous reflux involving the common femoral vein.  He has superficial venous reflux involving the great saphenous vein from the distal thigh to the saphenofemoral junction.  This feeds a large cluster of varicose veins in the distal thigh.   ASSESSMENT & PLAN:   CHRONIC VENOUS INSUFFICIENCY: This patient has significant chronic venous insufficiency.  He has CEAP clinical class III venous disease.  We have discussed the importance of intermittent leg elevation and the proper positioning for this.  I have written him a prescription for thigh-high compression stockings with a gradient of 20 to 30 mmHg.  I have encouraged him to avoid prolonged sitting and standing.  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  His truck does not allow him to lie flat as he does not have seats that allow this.  I have encouraged him to walk and exercise we also discussed the importance of weight management.  If his symptoms do not improve with conservative treatment I think he would be a candidate for endovenous laser ablation of the right great saphenous vein with greater than 10-20 stab phlebectomies.  I will see him back in 3 months.  He knows to call sooner if he has problems.   Waverly Ferrari [facility] [phone]

## 2018-09-15 DIAGNOSIS — M1712 Unilateral primary osteoarthritis, left knee: Secondary | ICD-10-CM | POA: Diagnosis not present

## 2018-09-15 DIAGNOSIS — M25561 Pain in right knee: Secondary | ICD-10-CM | POA: Diagnosis not present

## 2018-09-15 DIAGNOSIS — E876 Hypokalemia: Secondary | ICD-10-CM | POA: Diagnosis not present

## 2018-09-15 DIAGNOSIS — M25551 Pain in right hip: Secondary | ICD-10-CM | POA: Diagnosis not present

## 2018-10-12 DIAGNOSIS — M7061 Trochanteric bursitis, right hip: Secondary | ICD-10-CM | POA: Diagnosis not present

## 2018-10-12 DIAGNOSIS — S83242A Other tear of medial meniscus, current injury, left knee, initial encounter: Secondary | ICD-10-CM | POA: Diagnosis not present

## 2018-10-12 DIAGNOSIS — M79662 Pain in left lower leg: Secondary | ICD-10-CM | POA: Diagnosis not present

## 2018-10-26 DIAGNOSIS — M25562 Pain in left knee: Secondary | ICD-10-CM | POA: Diagnosis not present

## 2018-11-02 DIAGNOSIS — M25562 Pain in left knee: Secondary | ICD-10-CM | POA: Diagnosis not present

## 2018-11-24 ENCOUNTER — Ambulatory Visit (INDEPENDENT_AMBULATORY_CARE_PROVIDER_SITE_OTHER): Payer: 59 | Admitting: Vascular Surgery

## 2018-11-24 ENCOUNTER — Encounter: Payer: Self-pay | Admitting: Vascular Surgery

## 2018-11-24 ENCOUNTER — Other Ambulatory Visit: Payer: Self-pay

## 2018-11-24 VITALS — BP 154/80 | HR 55 | Temp 97.4°F | Resp 18 | Ht 70.0 in | Wt 250.0 lb

## 2018-11-24 DIAGNOSIS — I83811 Varicose veins of right lower extremities with pain: Secondary | ICD-10-CM | POA: Diagnosis not present

## 2018-11-24 DIAGNOSIS — I872 Venous insufficiency (chronic) (peripheral): Secondary | ICD-10-CM | POA: Diagnosis not present

## 2018-11-24 NOTE — Progress Notes (Signed)
Patient name: Reginald [name] Garbutt MRN: [id number] DOB: 08-02-1966 Sex: male  REASON FOR VISIT:   Follow-up of chronic venous insufficiency.  HPI:   Reginald [name] Shuman is a pleasant 53 y.o. male who I last saw on 08/25/2018.  He had painful varicose veins of the right lower extremity.  Patient had a knee replacement in March and had significant leg swelling.  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  His venous reflux study showed superficial venous reflux involving the great saphenous vein on the right from the distal thigh to the saphenofemoral junction.  This bed a large cluster of varicose veins in the distal thigh.  I looked at the great saphenous vein myself with the SonoSite and it was significantly enlarged throughout especially in the proximal thigh.  I felt that I could cannulate the vein in the distal thigh just above the takeoff of the large varicose veins if he failed conservative treatment.  We discussed conservative treatment and he returns for 7917-month follow-up visit.  Since I saw him last he continues to have significant pain associated with his large varicose veins in the medial distal right thigh and proximal right medial calf.  The symptoms are aggravated by sitting and standing and relieved with elevation.  He has been wearing his thigh-high compression stockings without significant relief.  He is had to take pain medication for this.  There have been no other significant changes in his medical history.  He is continued to have some pain where he had a knee replacement in May.  Past Medical History:  Diagnosis Date  . Arthritis   . Headache(784.0)   . Hypertension     Family History  Problem Relation Age of Onset  . Cancer Mother        breast  . Heart disease Mother     SOCIAL HISTORY: Social History   Tobacco Use  . Smoking status: Never Smoker  . Smokeless tobacco: Never Used  Substance Use Topics  . Alcohol use: Yes    Alcohol/week: 1.0 standard  drinks    Types: 1 Cans of beer per week    Allergies  Allergen Reactions  . Ibuprofen Nausea And Vomiting    High doses make him vomit.    Current Outpatient Medications  Medication Sig Dispense Refill  . amLODipine (NORVASC) 10 MG tablet Take 10 mg by mouth daily.     Marland Kitchen. losartan-hydrochlorothiazide (HYZAAR) 50-12.5 MG tablet Take 1 tablet by mouth daily.    Marland Kitchen. oxyCODONE-acetaminophen (PERCOCET/ROXICET) 5-325 MG tablet Take 1-2 tablets by mouth every 6 (six) hours as needed for severe pain. 60 tablet 0  . aspirin EC 325 MG tablet Take 1 tablet (325 mg total) by mouth 2 (two) times daily after a meal. Take x 1 month post op to decrease risk of blood clots. (Patient not taking: Reported on 11/24/2018) 60 tablet 0  . docusate sodium (COLACE) 100 MG capsule Take 1 capsule (100 mg total) by mouth 2 (two) times daily. (Patient not taking: Reported on 11/24/2018) 30 capsule 0  . tiZANidine (ZANAFLEX) 2 MG tablet Take 1 tablet (2 mg total) by mouth every 8 (eight) hours as needed for muscle spasms. (Patient not taking: Reported on 08/25/2018) 50 tablet 0   No current facility-administered medications for this visit.     REVIEW OF SYSTEMS:  [X]  denotes positive finding, [ ]  denotes negative finding Cardiac  Comments:  Chest pain or chest pressure:    Shortness  of breath upon exertion:    Short of breath when lying flat:    Irregular heart rhythm:        Vascular    Pain in calf, thigh, or hip brought on by ambulation:    Pain in feet at night that wakes you up from your sleep:     Blood clot in your veins:    Leg swelling:         Pulmonary    Oxygen at home:    Productive cough:     Wheezing:         Neurologic    Sudden weakness in arms or legs:     Sudden numbness in arms or legs:     Sudden onset of difficulty speaking or slurred speech:    Temporary loss of vision in one eye:     Problems with dizziness:         Gastrointestinal    Blood in stool:     Vomited blood:           Genitourinary    Burning when urinating:     Blood in urine:        Psychiatric    Major depression:         Hematologic    Bleeding problems:    Problems with blood clotting too easily:        Skin    Rashes or ulcers:        Constitutional    Fever or chills:     PHYSICAL EXAM:   Vitals:   11/24/18 0826  BP: (!) 154/80  Pulse: (!) 55  Resp: 18  Temp: (!) 97.4 F (36.3 C)  TempSrc: Oral  SpO2: 98%  Weight: 250 lb (113.4 kg)  Height: 5\' 10"  (1.778 m)    GENERAL: The patient is a well-nourished male, in no acute distress. The vital signs are documented above. CARDIAC: There is a regular rate and rhythm.  VASCULAR: I do not detect carotid bruits. He has palpable dorsalis pedis pulses bilaterally. He has a large cluster of varicose veins in his medial distal right thigh and medial right calf that are under significant pressure.  I did look at his great saphenous vein with the SonoSite again and again this is significantly dilated with reflux and feeding this large cluster of veins.  I can clearly identify the saphenofemoral junction. PULMONARY: There is good air exchange bilaterally without wheezing or rales. ABDOMEN: Soft and non-tender with normal pitched bowel sounds.  MUSCULOSKELETAL: There are no major deformities or cyanosis. NEUROLOGIC: No focal weakness or paresthesias are detected. SKIN: There are no ulcers or rashes noted. PSYCHIATRIC: The patient has a normal affect.  DATA:    No new data  MEDICAL ISSUES:   CHRONIC VENOUS INSUFFICIENCY WITH PAINFUL VARICOSE VEINS RIGHT LOWER EXTREMITY: The patient is failed conservative treatment.  I think he is a good candidate for endovenous laser ablation of the right great saphenous vein and 10-20 stab phlebectomies. I have discussed the indications for endovenous laser ablation of the right GSV, that is to lower the pressure in the veins and potentially help relieve the symptoms from venous hypertension. I have also  discussed alternative options including conservative treatment with leg elevation, compression therapy, exercise, avoiding prolonged sitting and standing, and weight management. I have discussed the potential complications of the procedure, including, but not limited to: bleeding, bruising, leg swelling, nerve injury, skin burns, significant pain from phlebitis, deep venous thrombosis, or failure  of the vein to close.  I have also explained that venous insufficiency is a chronic disease, and that the patient is at risk for recurrent varicose veins in the future.  All of the patient's questions were encouraged and answered. They are agreeable to proceed.   I have discussed with the patient the indications for stab phlebectomy.  I have explained to the patient that that will have small scars from the stab incisions.  I explained that the other risks include leg swelling, bruising, bleeding, and phlebitis.  All the patient's questions were encouraged and answered and they are agreeable to proceed.  Of note he is concerned about being awake during the procedure.  He may benefit from receiving Ativan before coming on the day of his procedure.  A total of 30 minutes was spent on this visit. 15 minutes was face to face time. More than 50% of the time was spent on counseling and coordinating with the patient.    Waverly Ferrarihristopher Emett Stapel [facility] [phone]

## 2018-12-07 ENCOUNTER — Other Ambulatory Visit: Payer: Self-pay | Admitting: *Deleted

## 2018-12-07 DIAGNOSIS — I83811 Varicose veins of right lower extremities with pain: Secondary | ICD-10-CM

## 2018-12-14 DIAGNOSIS — M25562 Pain in left knee: Secondary | ICD-10-CM | POA: Diagnosis not present

## 2018-12-14 DIAGNOSIS — M25561 Pain in right knee: Secondary | ICD-10-CM | POA: Diagnosis not present

## 2018-12-15 DIAGNOSIS — M25561 Pain in right knee: Secondary | ICD-10-CM | POA: Diagnosis not present

## 2018-12-15 DIAGNOSIS — I10 Essential (primary) hypertension: Secondary | ICD-10-CM | POA: Diagnosis not present

## 2019-01-11 ENCOUNTER — Other Ambulatory Visit: Payer: Self-pay | Admitting: *Deleted

## 2019-01-11 MED ORDER — LORAZEPAM 1 MG PO TABS: 1.0000 mg | ORAL_TABLET | ORAL | 0 refills | Status: DC

## 2019-01-12 DIAGNOSIS — Z024 Encounter for examination for driving license: Secondary | ICD-10-CM | POA: Diagnosis not present

## 2019-01-13 ENCOUNTER — Encounter: Payer: Self-pay | Admitting: Vascular Surgery

## 2019-01-13 ENCOUNTER — Ambulatory Visit (INDEPENDENT_AMBULATORY_CARE_PROVIDER_SITE_OTHER): Payer: 59 | Admitting: Vascular Surgery

## 2019-01-13 ENCOUNTER — Other Ambulatory Visit: Payer: Self-pay

## 2019-01-13 VITALS — BP 129/82 | HR 63 | Temp 97.2°F | Resp 18 | Ht 70.0 in | Wt 252.0 lb

## 2019-01-13 DIAGNOSIS — I83811 Varicose veins of right lower extremities with pain: Secondary | ICD-10-CM | POA: Diagnosis not present

## 2019-01-13 HISTORY — PX: ENDOVENOUS ABLATION SAPHENOUS VEIN W/ LASER: SUR449

## 2019-01-13 NOTE — Progress Notes (Signed)
Patient name: Reginald [name] MRN: [id number] DOB: 1966-03-12 Sex: male  REASON FOR VISIT:   For laser ablation of the right great saphenous vein and 10-20 stab phlebectomies.  HPI:   Reginald [name] is a pleasant 53 y.o. male who I last saw on 11/24/2018.  This patient has significant pain associated with his varicose veins in the right lower extremity and also significant swelling.  He is failed conservative treatment.  He has reflux throughout his entire right great saphenous vein also a large cluster of varicosities which come off of this incompetent saphenous vein.  He presents for laser ablation of the right great saphenous vein and 10-20 stab phlebectomies.  Current Outpatient Medications  Medication Sig Dispense Refill  . amLODipine (NORVASC) 10 MG tablet Take 10 mg by mouth daily.     Marland Kitchen aspirin EC 325 MG tablet Take 1 tablet (325 mg total) by mouth 2 (two) times daily after a meal. Take x 1 month post op to decrease risk of blood clots. (Patient not taking: Reported on 11/24/2018) 60 tablet 0  . docusate sodium (COLACE) 100 MG capsule Take 1 capsule (100 mg total) by mouth 2 (two) times daily. (Patient not taking: Reported on 11/24/2018) 30 capsule 0  . LORazepam (ATIVAN) 1 MG tablet Take 1 tablet (1 mg total) by mouth as directed. Take 1 tablet 30 to 60 minutes prior to the procedure. Bring second tablet with you to MD office on procedure day. 2 tablet 0  . losartan-hydrochlorothiazide (HYZAAR) 50-12.5 MG tablet Take 1 tablet by mouth daily.    Marland Kitchen oxyCODONE-acetaminophen (PERCOCET/ROXICET) 5-325 MG tablet Take 1-2 tablets by mouth every 6 (six) hours as needed for severe pain. 60 tablet 0  . tiZANidine (ZANAFLEX) 2 MG tablet Take 1 tablet (2 mg total) by mouth every 8 (eight) hours as needed for muscle spasms. (Patient not taking: Reported on 08/25/2018) 50 tablet 0   No current facility-administered medications for this visit.     REVIEW OF SYSTEMS:  [X]  denotes positive finding, [ ]   denotes negative finding Vascular    Leg swelling    Cardiac    Chest pain or chest pressure:    Shortness of breath upon exertion:    Short of breath when lying flat:    Irregular heart rhythm:    Constitutional    Fever or chills:     PHYSICAL EXAM:   Vitals:   01/13/19 0848  BP: 129/82  Pulse: 63  Resp: 18  Temp: (!) 97.2 F (36.2 C)  TempSrc: Oral  SpO2: 97%  Weight: 252 lb (114.3 kg)  Height: 5\' 10"  (1.778 m)    GENERAL: The patient is a well-nourished male, in no acute distress. The vital signs are documented above.  DATA:   No new data  MEDICAL ISSUES:   LASER ABLATION RIGHT GREAT SAPHENOUS VEIN AND 10-20 STAB PHLEBECTOMIES: The patient was taken to the exam room and I marked his dilated varicose veins in the right leg with the patient standing.  The patient was then placed supine.  I interrogated the saphenous vein with the SonoSite and elected to cannulate the vein in the distal thigh.  The right leg was prepped and draped in usual sterile fashion.  After the skin was anesthetized with 1% lidocaine, and under ultrasound guidance, the right great saphenous vein was cannulated in the distal thigh and a micropuncture sheath introduced over the micropuncture wire.  I then advanced the J-wire up to below the saphenofemoral  junction under ultrasound guidance.  The 35 cm sheath was advanced over the wire and the dilator and wire were removed.  I then advanced the laser fiber to the end of the sheath which was below the superficial epigastric vein.  I was approximately 2.5 cm from the saphenofemoral junction.  Tumescent anesthesia was then administered the entire length of the right great saphenous vein.  The patient was then placed in Trendelenburg.  With the saphenofemoral junction compressed endovenous laser ablation was performed of the right great saphenous vein to the distal thigh.  A total of 2398 J of energy were used.  Attention was then turned to stab phlebectomies.   Tumescent anesthesia was administered over all the marked areas.  Using small stab incisions with an 11 blade (approximately 15 stabs), the veins were retracted above the level of the incision with a hook and then gently removed with blunt dissection using hemostats.  Some very large segments were removed.  Pressure was held for hemostasis.  Sterile dressing was applied.  Patient tolerated the procedure well.  He will return in 1 week for follow-up duplex.  Waverly Ferrari [facility] [phone]

## 2019-01-13 NOTE — Progress Notes (Signed)
     Laser Ablation Procedure    Date: 01/13/2019   QUAY MARSACK DOB:1965-12-05  Consent signed: Yes    Surgeon:  Dr. Waverly Ferrari   Procedure: Laser Ablation: right Greater Saphenous Vein  BP 129/82 (BP Location: Left Arm, Patient Position: Sitting, Cuff Size: Large)   Pulse 63   Temp (!) 97.2 F (36.2 C) (Oral)   Resp 18   Ht 5\' 10"  (1.778 m)   Wt 252 lb (114.3 kg)   SpO2 97%   BMI 36.16 kg/m   Tumescent Anesthesia: 650 cc 0.9% NaCl with 50 cc Lidocaine HCL 1% and 15 cc 8.4% NaHCO3  Local Anesthesia: 12 cc Lidocaine HCL and NaHCO3 (ratio 2:1)  17 watts continuous mode        Total energy: 2398 Joules   Total time: 2:20    Stab Phlebectomy: 10-20 Sites: Thigh and Calf  Patient tolerated procedure well  Notes: Ativan 1 mg taken by patient on 01-13-2019 at 7:30AM and at 8:30AM.    Description of Procedure:  After marking the course of the secondary varicosities, the patient was placed on the operating table in the supine position, and the right leg was prepped and draped in sterile fashion.   Local anesthetic was administered and under ultrasound guidance the saphenous vein was accessed with a micro needle and guide wire; then the mirco puncture sheath was placed.  A guide wire was inserted saphenofemoral junction , followed by a 5 french sheath.  The position of the sheath and then the laser fiber below the junction was confirmed using the ultrasound.  Tumescent anesthesia was administered along the course of the saphenous vein using ultrasound guidance. The patient was placed in Trendelenburg position and protective laser glasses were placed on patient and staff, and the laser was fired at 15 watts continuous mode advancing 1-56mm/second for a total of 2398 joules.   For stab phlebectomies, local anesthetic was administered at the previously marked varicosities, and tumescent anesthesia was administered around the vessels.  Ten to 20 stab wounds were made using the  tip of an 11 blade. And using the vein hook, the phlebectomies were performed using a hemostat to avulse the varicosities.  Adequate hemostasis was achieved.     Steri strips were applied to the stab wounds and ABD pads and thigh high compression stockings were applied.  Ace wrap bandages were applied over the phlebectomy sites and at the top of the saphenofemoral junction. Blood loss was less than 15 cc.  The patient ambulated out of the operating room having tolerated the procedure well.

## 2019-01-14 ENCOUNTER — Encounter: Payer: Self-pay | Admitting: Vascular Surgery

## 2019-01-20 ENCOUNTER — Other Ambulatory Visit: Payer: Self-pay

## 2019-01-20 ENCOUNTER — Encounter: Payer: Self-pay | Admitting: Vascular Surgery

## 2019-01-20 ENCOUNTER — Ambulatory Visit (HOSPITAL_COMMUNITY)
Admission: RE | Admit: 2019-01-20 | Discharge: 2019-01-20 | Disposition: A | Discharge status: Home / Self Care | Payer: 59 | Source: Ambulatory Visit | Attending: Vascular Surgery | Admitting: Vascular Surgery

## 2019-01-20 ENCOUNTER — Ambulatory Visit (INDEPENDENT_AMBULATORY_CARE_PROVIDER_SITE_OTHER): Payer: Self-pay | Admitting: Vascular Surgery

## 2019-01-20 VITALS — BP 141/88 | HR 57 | Temp 97.3°F | Resp 16 | Ht 70.0 in | Wt 250.0 lb

## 2019-01-20 DIAGNOSIS — I83811 Varicose veins of right lower extremities with pain: Secondary | ICD-10-CM

## 2019-01-20 NOTE — Progress Notes (Signed)
Patient name: Reginald [name] MRN: [id number] DOB: Oct 11, 1966 Sex: male  REASON FOR VISIT:   Follow-up after laser ablation of the right great saphenous vein and 10-20 stabs.  HPI:   Reginald [name] is a pleasant 53 y.o. male who underwent laser ablation of the right great saphenous vein and 10-20 stabs of the right lower extremity on 01/13/2019.  He returns for his first follow-up visit.  He has no specific complaints today.  He has had some mild ecchymosis in the proximal thigh but no significant pain.  He has been wearing his thigh-high compression stockings.  He has no further procedures planned.  Current Outpatient Medications  Medication Sig Dispense Refill  . amLODipine (NORVASC) 10 MG tablet Take 10 mg by mouth daily.     Marland Kitchen aspirin EC 325 MG tablet Take 1 tablet (325 mg total) by mouth 2 (two) times daily after a meal. Take x 1 month post op to decrease risk of blood clots. (Patient not taking: Reported on 11/24/2018) 60 tablet 0  . docusate sodium (COLACE) 100 MG capsule Take 1 capsule (100 mg total) by mouth 2 (two) times daily. (Patient not taking: Reported on 11/24/2018) 30 capsule 0  . LORazepam (ATIVAN) 1 MG tablet Take 1 tablet (1 mg total) by mouth as directed. Take 1 tablet 30 to 60 minutes prior to the procedure. Bring second tablet with you to MD office on procedure day. 2 tablet 0  . losartan-hydrochlorothiazide (HYZAAR) 50-12.5 MG tablet Take 1 tablet by mouth daily.    Marland Kitchen oxyCODONE-acetaminophen (PERCOCET/ROXICET) 5-325 MG tablet Take 1-2 tablets by mouth every 6 (six) hours as needed for severe pain. 60 tablet 0  . tiZANidine (ZANAFLEX) 2 MG tablet Take 1 tablet (2 mg total) by mouth every 8 (eight) hours as needed for muscle spasms. (Patient not taking: Reported on 08/25/2018) 50 tablet 0   No current facility-administered medications for this visit.     REVIEW OF SYSTEMS:  [X]  denotes positive finding, [ ]  denotes negative finding Vascular    Leg swelling    Cardiac     Chest pain or chest pressure:    Shortness of breath upon exertion:    Short of breath when lying flat:    Irregular heart rhythm:    Constitutional    Fever or chills:     PHYSICAL EXAM:   Vitals:   01/20/19 1011  BP: (!) 141/88  Pulse: (!) 57  Resp: 16  Temp: (!) 97.3 F (36.3 C)  TempSrc: Oral  SpO2: 98%  Weight: 250 lb (113.4 kg)  Height: 5\' 10"  (1.778 m)    GENERAL: The patient is a well-nourished male, in no acute distress. The vital signs are documented above. CARDIOVASCULAR: There is a regular rate and rhythm. PULMONARY: There is good air exchange bilaterally without wheezing or rales. VASCULAR: Is small stab incisions are all healing nicely.  He has some ecchymosis in the proximal thigh which is resolving.  DATA:   VENOUS DUPLEX: I have independently interpreted his venous duplex scan today.  There is no evidence of DVT.  The vein was successfully closed to within 2.9 cm of the saphenofemoral junction.  MEDICAL ISSUES:   STATUS POST LASER ABLATION AND STAB PHLEBECTOMIES: Patient is doing well status post laser ablation of the right great saphenous vein with 10-20 stabs.  Patient is doing well.  He will continue to wear his compression stockings for another week.  We have also discussed the importance of continuing with  leg elevation, compression stockings, avoiding prolonged sitting and standing, and exercise.  He understands that venous disease is a progressive disease.  I will see him back as needed.  Waverly Ferrari [facility] [phone]

## 2019-08-10 ENCOUNTER — Other Ambulatory Visit: Payer: Self-pay | Admitting: Orthopedic Surgery

## 2019-08-10 DIAGNOSIS — M25561 Pain in right knee: Secondary | ICD-10-CM

## 2019-08-15 ENCOUNTER — Encounter (HOSPITAL_COMMUNITY): Payer: 59

## 2019-08-15 ENCOUNTER — Encounter (HOSPITAL_COMMUNITY): Payer: Self-pay

## 2019-08-19 ENCOUNTER — Encounter (HOSPITAL_COMMUNITY)
Admission: RE | Admit: 2019-08-19 | Discharge: 2019-08-19 | Disposition: A | Discharge status: Home / Self Care | Payer: 59 | Source: Ambulatory Visit | Attending: Orthopedic Surgery | Admitting: Orthopedic Surgery

## 2019-08-19 ENCOUNTER — Other Ambulatory Visit: Payer: Self-pay

## 2019-08-19 DIAGNOSIS — M25561 Pain in right knee: Secondary | ICD-10-CM | POA: Insufficient documentation

## 2019-08-19 IMAGING — NM NM BONE 3 PHASE
9 series · 19 of 19 positions shown · non-contrast
Comparison: None

Radiographic correlation: None since surgery

CLINICAL DATA: RIGHT knee pain; RIGHT knee arthroplasty [DATE],
has never felt right, pain and swelling

EXAM:
NUCLEAR MEDICINE 3-PHASE BONE SCAN
TECHNIQUE: Radionuclide angiographic images, immediate static blood pool
images, and 3-hour delayed static images were obtained of the knees
after intravenous injection of radiopharmaceutical.
RADIOPHARMACEUTICALS:  21.7 mCi [IC] MDP IV

[Series 2: flow · 2.07mm/px · 6 of 48 frames shown (1 of 2)]
[frame 5/48  full-range]
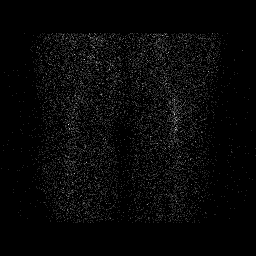
[frame 13/48  full-range]
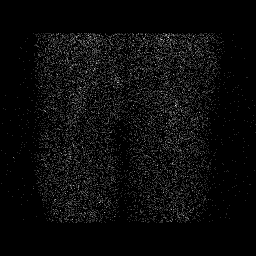
[frame 21/48  full-range]
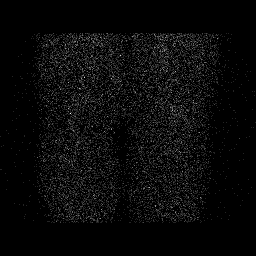
[frame 29/48  full-range]
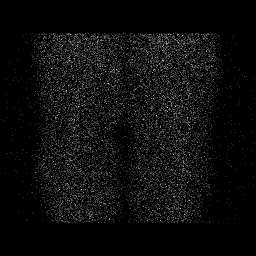
[frame 37/48  full-range]
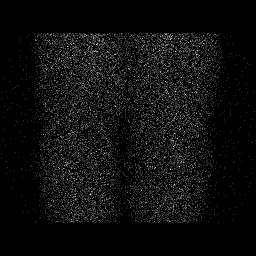
[frame 45/48  full-range]
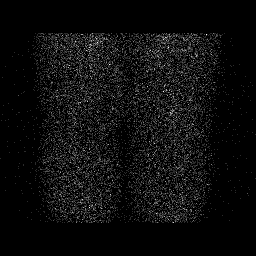

[Series 2: flow · 2.07mm/px · 6 of 48 frames shown (2 of 2)]
[frame 5/48  full-range]
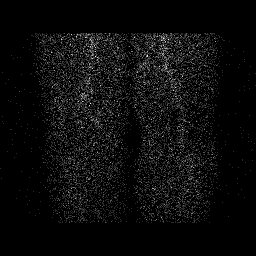
[frame 13/48  full-range]
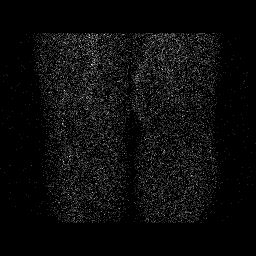
[frame 21/48  full-range]
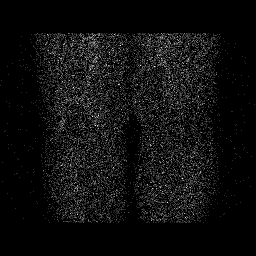
[frame 29/48  full-range]
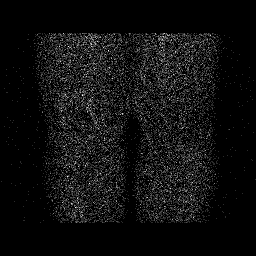
[frame 37/48  full-range]
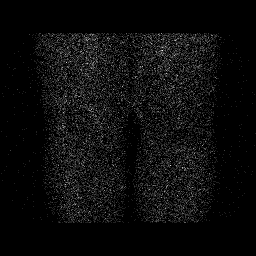
[frame 45/48  full-range]
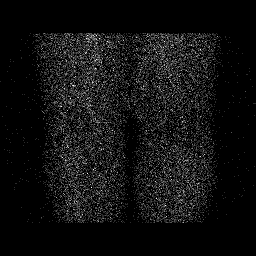

[Series 3: blood pool · 2.07mm/px · 1 of 1 slices shown (1 of 2)]
[im 1/1  full-range]
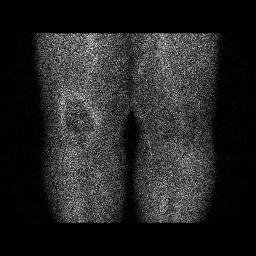

[Series 3: blood pool · 2.07mm/px · 1 of 1 slices shown (2 of 2)]
[im 1/1  full-range]
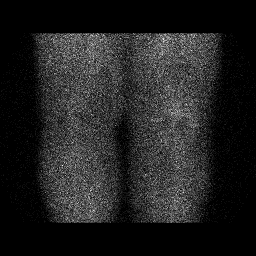

[Series 4: lat bp · 2.07mm/px · 1 of 1 slices shown (1 of 2)]
[im 1/1  full-range]
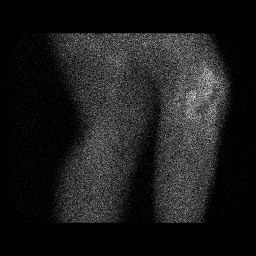

[Series 4: lat bp · 2.07mm/px · 1 of 1 slices shown (2 of 2)]
[im 1/1  full-range]
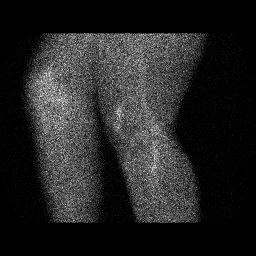

[Series 5: delay · delayed · 2.07mm/px · 1 of 1 slices shown (1 of 3)]
[im 1/1  full-range]
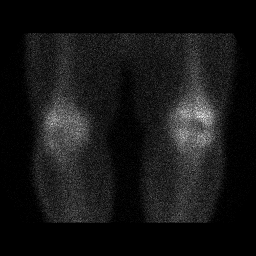

[Series 6: delay · delayed · 2.07mm/px · 1 of 1 slices shown (2 of 3)]
[im 1/1]
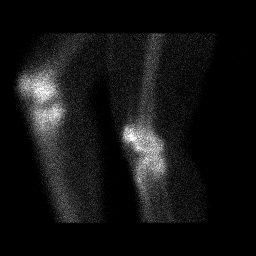

[Series 6: delay · delayed · 2.07mm/px · 1 of 1 slices shown (3 of 3)]
[im 1/1]
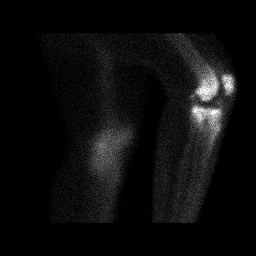

[19 of 19 positions shown; findings below may reference images not displayed]

FINDINGS: Vascular phase: Mildly increased blood flow surrounding the anterior
aspect of the RIGHT knee joint. Normal blood flow to LEFT knee.

Blood pool phase: Abnormal increased blood pool surrounding the
RIGHT knee joint especially anteriorly. Normal blood pool LEFT knee.

Delayed phase: Minimal uptake at the mediolateral compartments of
the LEFT knee consistent with degenerative changes. Photopenic
defect at RIGHT knee from prosthetic hardware. Mild diffuse uptake
surrounding the RIGHT knee prosthesis. No definite focal abnormal
tracer uptake adjacent to the RIGHT knee prosthesis to suggest
loosening or infection.
IMPRESSION: Increased blood flow and blood pool of tracer surrounding the RIGHT
knee joint consistent with synovitis.

RIGHT knee prosthesis without definite scintigraphic evidence of
aseptic loosening or infection.

## 2019-08-19 MED ORDER — TECHNETIUM TC 99M MEDRONATE IV KIT
21.7000 | PACK | Freq: Once | INTRAVENOUS | Status: AC | PRN
  Administered 2019-08-19: 21.7 via INTRAVENOUS

## 2019-08-30 ENCOUNTER — Other Ambulatory Visit: Payer: Self-pay | Admitting: Physician Assistant

## 2019-09-01 ENCOUNTER — Other Ambulatory Visit: Payer: Self-pay | Admitting: Physician Assistant

## 2019-09-01 DIAGNOSIS — M25561 Pain in right knee: Secondary | ICD-10-CM

## 2019-09-21 ENCOUNTER — Ambulatory Visit
Admission: RE | Admit: 2019-09-21 | Discharge: 2019-09-21 | Disposition: A | Discharge status: Home / Self Care | Payer: 59 | Source: Ambulatory Visit | Attending: Physician Assistant | Admitting: Physician Assistant

## 2019-09-21 ENCOUNTER — Other Ambulatory Visit: Payer: Self-pay

## 2019-09-21 DIAGNOSIS — M25561 Pain in right knee: Secondary | ICD-10-CM

## 2019-09-21 IMAGING — MR MR KNEE*R* WO/W CM
5 of 9 series · 19 of 40 positions shown · IV contrast (multihance)
Comparison: Radiographs [DATE] and 3 phase bone scan [DATE]

CLINICAL DATA: History of total knee arthroplasty in [DATE].
Chronic knee pain and weakness.

EXAM:
MRI OF THE RIGHT KNEE WITHOUT AND WITH CONTRAST
TECHNIQUE: Multiplanar, multisequence MR imaging of the right knee was
performed both before and after administration of intravenous
contrast.
CONTRAST:  20mL MULTIHANCE GADOBENATE DIMEGLUMINE 529 MG/ML IV SOLN

[Series 5: T1 · axial · 4.0mm · 0.31mm/px · z∈[-88,+97]mm · 4 of 38 slices shown (1 of 2)]
[im 1/38]
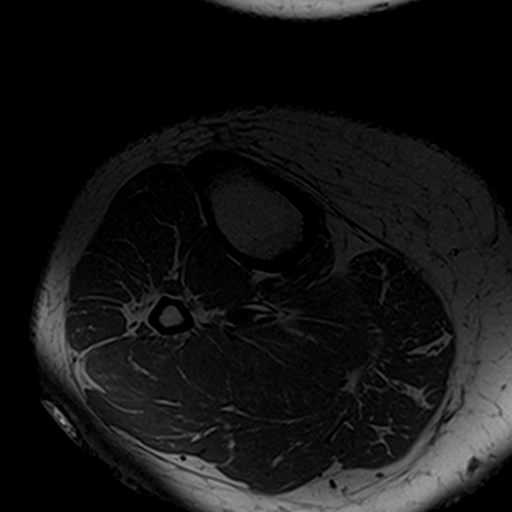
[im 13/38]
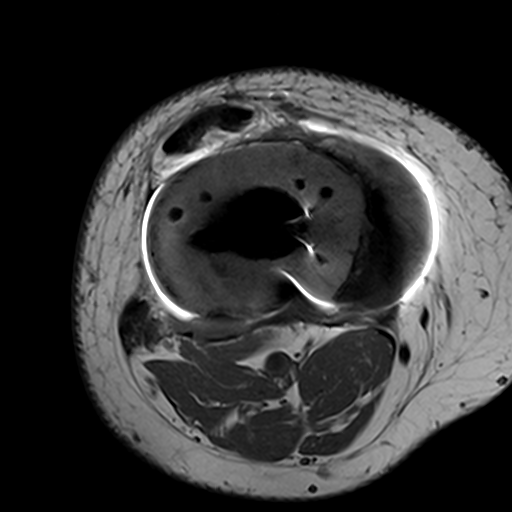
[im 25/38]
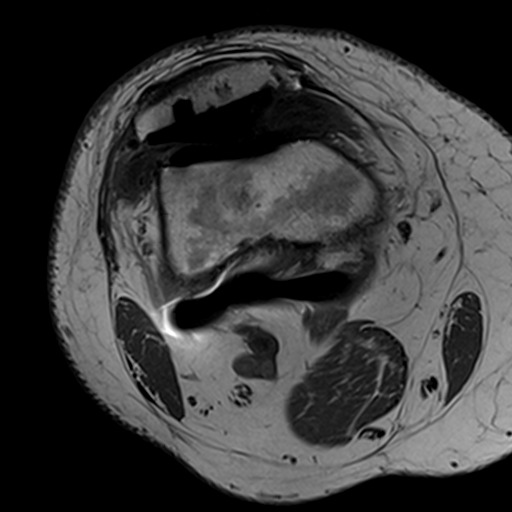
[im 38/38]
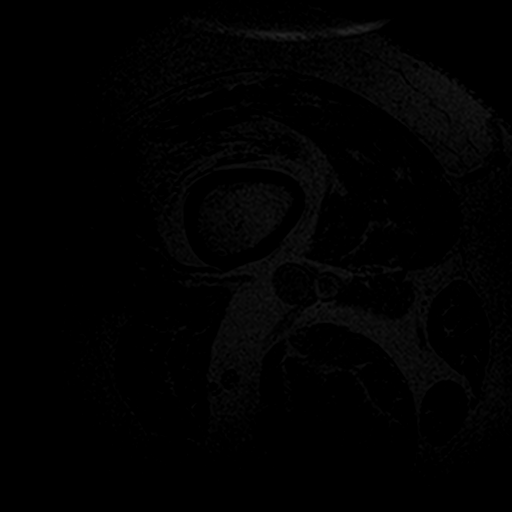

[Series 9: T1 · coronal · 4.0mm · 0.29mm/px · 4 of 27 slices shown (2 of 2)]
[im 1/27]
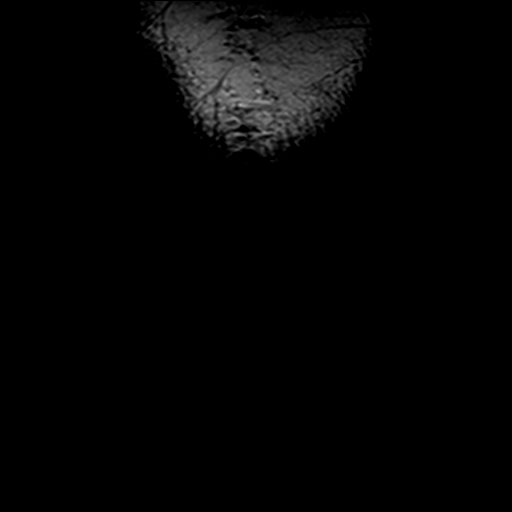
[im 9/27]
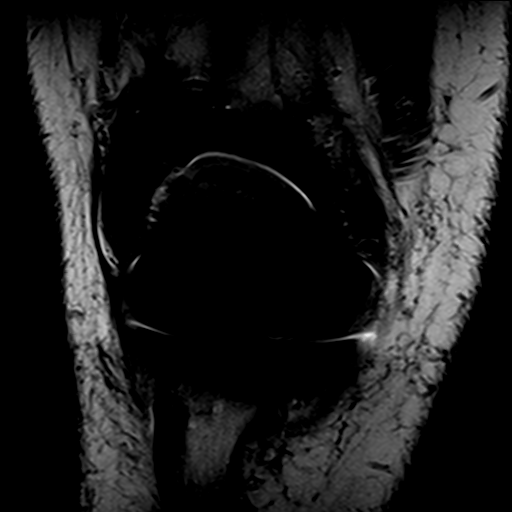
[im 18/27]
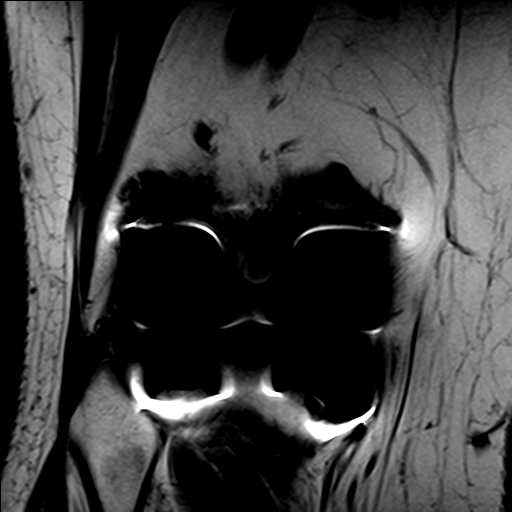
[im 27/27]
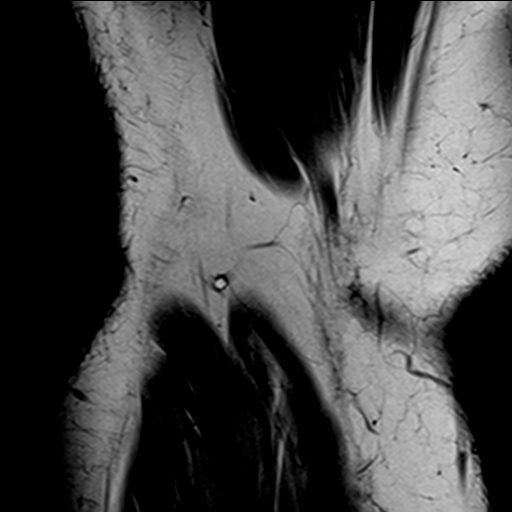

[Series 11: T1 fat-sat · axial · 4.0mm · 0.31mm/px · z∈[-88,+97]mm · 5 of 38 slices shown]
[im 1/38]
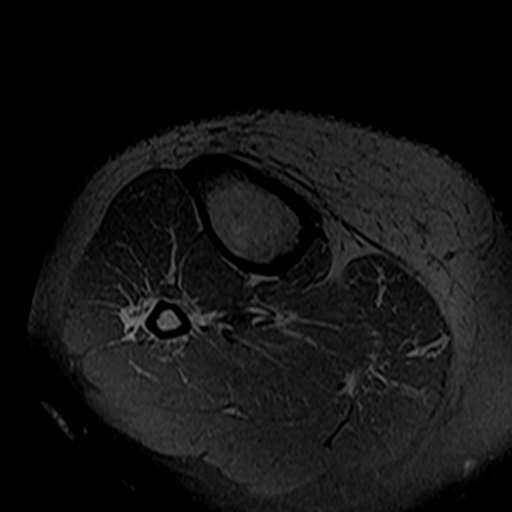
[im 10/38]
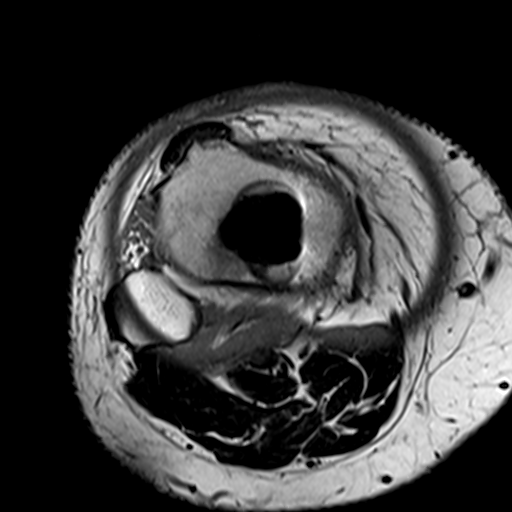
[im 19/38]
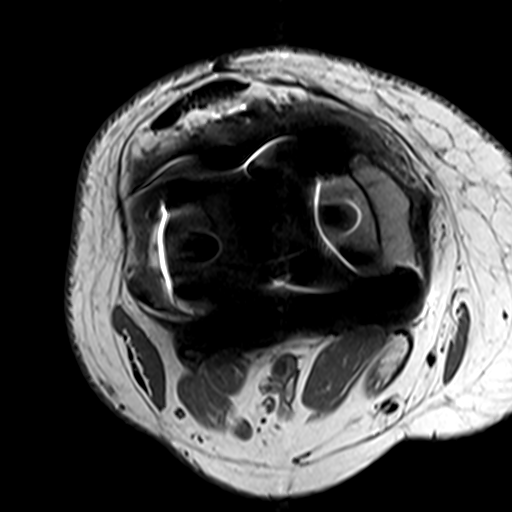
[im 28/38]
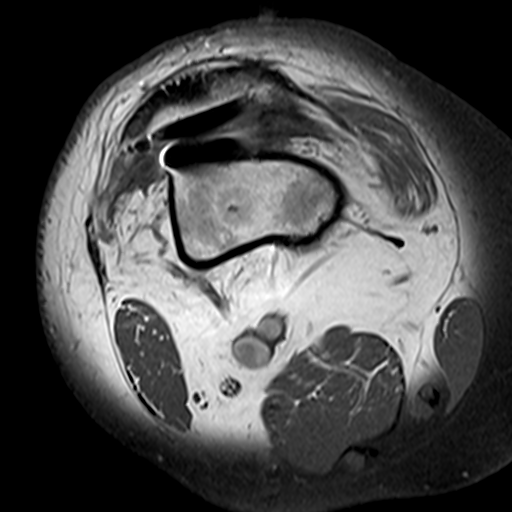
[im 38/38]
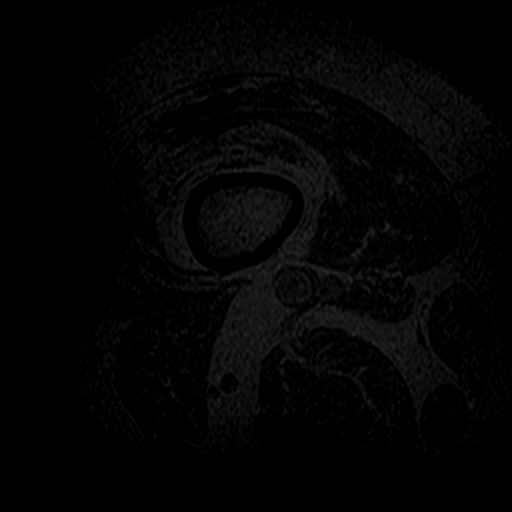

[Series 12: T1 fat-sat post-contrast · axial · 4.0mm · 0.31mm/px · z∈[-88,+97]mm · 5 of 38 slices shown (1 of 2)]
[im 1/38]
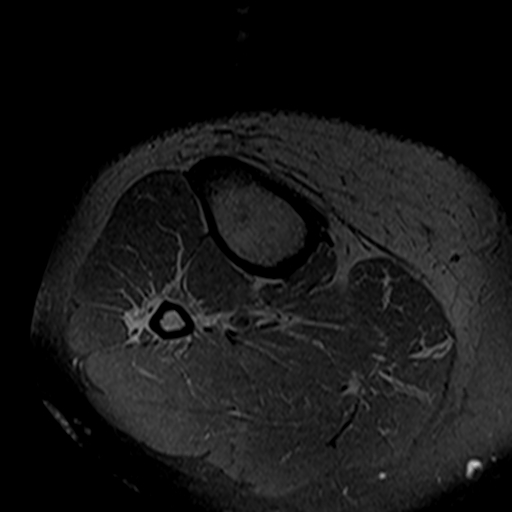
[im 10/38]
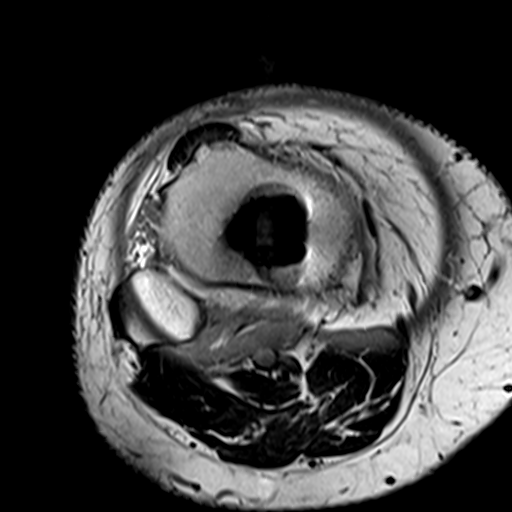
[im 19/38]
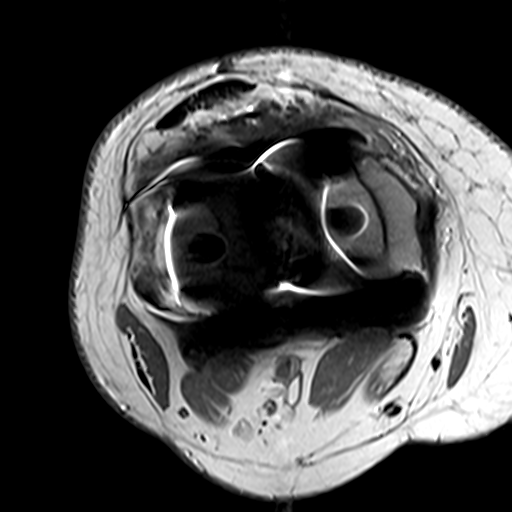
[im 28/38]
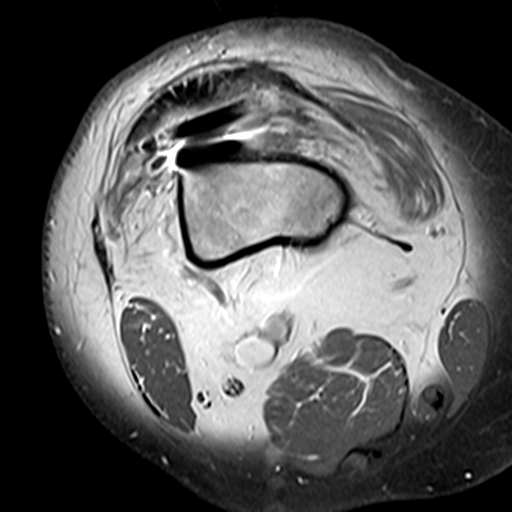
[im 38/38]
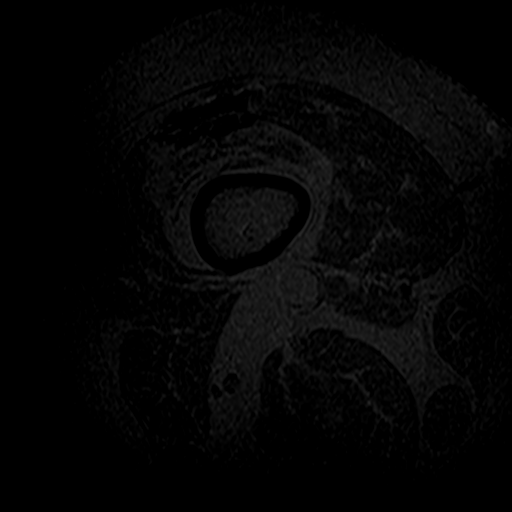

[Series 13: T1 fat-sat post-contrast · coronal · 4.0mm · 0.29mm/px · 1 of 27 slices shown (2 of 2)]
[im 1/27]
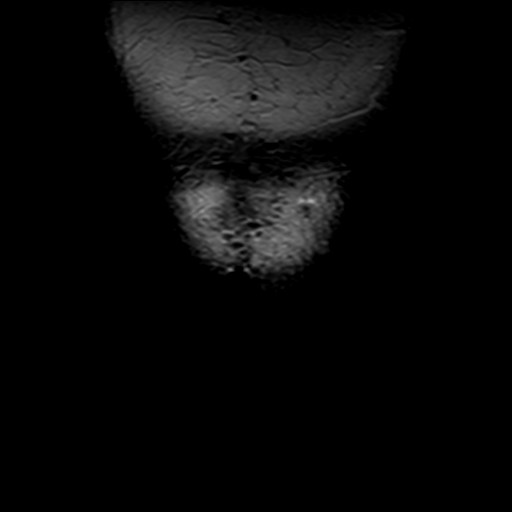

[19 of 40 positions shown; findings below may reference images not displayed]

FINDINGS: The total knee arthroplasty components appear well seated.
Significant associated artifact but I do not see any obvious
complicating features to suggest loosening, fracture or infection.

There is a small suprapatellar knee joint effusion and slightly
thickened synovium but relatively normal thin rim like synovial
enhancement after contrast.

The quadriceps and patellar tendons appear intact. The surrounding
knee musculature is unremarkable. No muscle tear or myositis.
IMPRESSION: 1. Intact components of the right total knee arthroplasty without MR
findings for loosening, fracture or infection.
2. Small suprapatellar knee joint effusion. The synovium appears
slightly thickened but I do not see any significant abnormal
enhancement.
3. Unremarkable appearance of the surrounding knee musculature.

## 2019-09-21 MED ORDER — GADOBENATE DIMEGLUMINE 529 MG/ML IV SOLN
20.0000 mL | Freq: Once | INTRAVENOUS | Status: AC | PRN
  Administered 2019-09-21: 20 mL via INTRAVENOUS

## 2019-10-11 ENCOUNTER — Other Ambulatory Visit: Payer: Self-pay | Admitting: Orthopedic Surgery

## 2019-10-31 NOTE — Patient Instructions (Addendum)
DUE TO COVID-19 ONLY ONE VISITOR IS ALLOWED TO COME WITH YOU AND STAY IN THE WAITING ROOM ONLY DURING PRE OP AND PROCEDURE DAY OF SURGERY. THE 1 VISITOR MAY VISIT WITH YOU AFTER SURGERY IN YOUR PRIVATE ROOM DURING VISITING HOURS ONLY!  YOU NEED TO HAVE A COVID 19 TEST ON___12/15/2020 @, 2:30 PM. THIS TEST MUST BE DONE BEFORE SURGERY, COME  [address], [place] , [address].  [facility]) ONCE YOUR COVID TEST IS COMPLETED, PLEASE BEGIN THE QUARANTINE INSTRUCTIONS AS OUTLINED IN YOUR HANDOUT.                Reginald [name]    Your procedure is scheduled on: 11/04/2019   Report to [facility] Main  Entrance   Report to admitting at 7:00 AM     Call this number if you have problems the morning of surgery [phone]    Remember: NO SOLID FOOD AFTER MIDNIGHT THE NIGHT PRIOR TO SURGERY. NOTHING BY MOUTH EXCEPT CLEAR LIQUIDS UNTIL . PLEASE FINISH ENSURE DRINK PER SURGEON ORDER  WHICH NEEDS TO BE COMPLETED AT : 6:30 AM   CLEAR LIQUID DIET   Foods Allowed                                                                     Foods Excluded  Coffee and tea, regular and decaf                             liquids that you cannot  Plain Jell-O any favor except red or purple                                           see through such as: Fruit ices (not with fruit pulp)                                     milk, soups, orange juice  Iced Popsicles                                    All solid food Carbonated beverages, regular and diet                                    Cranberry, grape and apple juices Sports drinks like Gatorade Lightly seasoned clear broth or consume(fat free) Sugar, honey syrup  Sample Menu Breakfast                                Lunch                                     Supper Cranberry juice  Beef broth                            Chicken broth Jell-O                                     Grape juice                            Apple juice Coffee or tea                        Jell-O                                      Popsicle                                                Coffee or tea                        Coffee or tea  . BRUSH YOUR TEETH MORNING OF SURGERY AND RINSE YOUR MOUTH OUT, NO CHEWING GUM CANDY OR MINTS.     Take these medicines the morning of surgery with A SIP OF WATER: Tylenol ,Amlodipine ,Norco if needed.                                 You may not have any metal on your body including hair pins and              piercings  Do not wear jewelry, lotions, powders or perfumes, deodorant             Do not shave  48 hours prior to surgery.              Men may shave face and neck.   Do not bring valuables to the hospital. [facility] IS NOT             RESPONSIBLE   FOR VALUABLES.  Contacts, dentures or bridgework may not be worn into surgery.  Leave suitcase in the car. After surgery it may be brought to your room.     Patients discharged the day of surgery will not be allowed to drive home. IF YOU ARE HAVING SURGERY AND GOING HOME THE SAME DAY, YOU MUST HAVE AN ADULT TO DRIVE YOU HOME AND BE WITH YOU FOR 24 HOURS. YOU MAY GO HOME BY TAXI OR UBER OR ORTHERWISE, BUT AN ADULT MUST ACCOMPANY YOU HOME AND STAY WITH YOU FOR 24 HOURS.  Name and phone number of your driver:  Special Instructions: N/A              Please read over the following fact sheets you were given: _____________________________________________________________________             _____________________________________________________________________  [facility] Pre[facility] Before surgery, you can play an important role.  Because skin is not sterile, your skin needs to be as free of germs as possible.  You can reduce the number  of germs on your skin by washing with CHG (chlorahexidine gluconate) soap before surgery.  CHG is an antiseptic cleaner which kills germs and bonds with the skin to continue killing germs  even after washing. Please DO NOT use if you have an allergy to CHG or antibacterial soaps.  If your skin becomes reddened/irritated stop using the CHG and inform your nurse when you arrive at Short Stay. Do not shave (including legs and underarms) for at least 48 hours prior to the first CHG shower.  You may shave your face/neck. Please follow these instructions carefully:  1.  Shower with CHG Soap the night before surgery and the  morning of Surgery.  2.  If you choose to wash your hair, wash your hair first as usual with your  normal  shampoo.  3.  After you shampoo, rinse your hair and body thoroughly to remove the  shampoo.                           4.  Use CHG as you would any other liquid soap.  You can apply chg directly  to the skin and wash                       Gently with a scrungie or clean washcloth.  5.  Apply the CHG Soap to your body ONLY FROM THE NECK DOWN.   Do not use on face/ open                           Wound or open sores. Avoid contact with eyes, ears mouth and genitals (private parts).                       Wash face,  Genitals (private parts) with your normal soap.             6.  Wash thoroughly, paying special attention to the area where your surgery  will be performed.  7.  Thoroughly rinse your body with warm water from the neck down.  8.  DO NOT shower/wash with your normal soap after using and rinsing off  the CHG Soap.                9.  Pat yourself dry with a clean towel.            10.  Wear clean pajamas.            11.  Place clean sheets on your bed the night of your first shower and do not  sleep with pets. Day of Surgery : Do not apply any lotions/deodorants the morning of surgery.  Please wear clean clothes to the hospital/surgery center.  FAILURE TO FOLLOW THESE INSTRUCTIONS MAY RESULT IN THE CANCELLATION OF YOUR SURGERY PATIENT SIGNATURE_________________________________  NURSE  SIGNATURE__________________________________  ________________________________________________________________________   Reginald [name]  An incentive spirometer is a tool that can help keep your lungs clear and active. This tool measures how well you are filling your lungs with each breath. Taking long deep breaths may help reverse or decrease the chance of developing breathing (pulmonary) problems (especially infection) following:  A long period of time when you are unable to move or be active. BEFORE THE PROCEDURE   If the spirometer includes an indicator to show your best effort, your nurse or respiratory therapist will set it to a desired  goal.  If possible, sit up straight or lean slightly forward. Try not to slouch.  Hold the incentive spirometer in an upright position. INSTRUCTIONS FOR USE  1. Sit on the edge of your bed if possible, or sit up as far as you can in bed or on a chair. 2. Hold the incentive spirometer in an upright position. 3. Breathe out normally. 4. Place the mouthpiece in your mouth and seal your lips tightly around it. 5. Breathe in slowly and as deeply as possible, raising the piston or the ball toward the top of the column. 6. Hold your breath for 3-5 seconds or for as long as possible. Allow the piston or ball to fall to the bottom of the column. 7. Remove the mouthpiece from your mouth and breathe out normally. 8. Rest for a few seconds and repeat Steps 1 through 7 at least 10 times every 1-2 hours when you are awake. Take your time and take a few normal breaths between deep breaths. 9. The spirometer may include an indicator to show your best effort. Use the indicator as a goal to work toward during each repetition. 10. After each set of 10 deep breaths, practice coughing to be sure your lungs are clear. If you have an incision (the cut made at the time of surgery), support your incision when coughing by placing a pillow or rolled up towels firmly  against it. Once you are able to get out of bed, walk around indoors and cough well. You may stop using the incentive spirometer when instructed by your caregiver.  RISKS AND COMPLICATIONS  Take your time so you do not get dizzy or light-headed.  If you are in pain, you may need to take or ask for pain medication before doing incentive spirometry. It is harder to take a deep breath if you are having pain. AFTER USE  Rest and breathe slowly and easily.  It can be helpful to keep track of a log of your progress. Your caregiver can provide you with a simple table to help with this. If you are using the spirometer at home, follow these instructions: [place] IF:   You are having difficultly using the spirometer.  You have trouble using the spirometer as often as instructed.  Your pain medication is not giving enough relief while using the spirometer.  You develop fever of 100.5 F (38.1 C) or higher. SEEK IMMEDIATE MEDICAL CARE IF:   You cough up bloody sputum that had not been present before.  You develop fever of 102 F (38.9 C) or greater.  You develop worsening pain at or near the incision site. MAKE SURE YOU:   Understand these instructions.  Will watch your condition.  Will get help right away if you are not doing well or get worse. Document Released: 03/16/2007 Document Revised: 01/26/2012 Document Reviewed: 05/17/2007 [facility] Patient Information 2014 [place], [place].   ________________________________________________________________________

## 2019-11-01 ENCOUNTER — Encounter (HOSPITAL_COMMUNITY)
Admission: RE | Admit: 2019-11-01 | Discharge: 2019-11-01 | Disposition: A | Discharge status: Home / Self Care | Payer: 59 | Source: Ambulatory Visit | Attending: Orthopedic Surgery | Admitting: Orthopedic Surgery

## 2019-11-01 ENCOUNTER — Ambulatory Visit (HOSPITAL_COMMUNITY)
Admission: RE | Admit: 2019-11-01 | Discharge: 2019-11-01 | Disposition: A | Discharge status: Home / Self Care | Payer: 59 | Source: Ambulatory Visit | Attending: Orthopedic Surgery | Admitting: Orthopedic Surgery

## 2019-11-01 ENCOUNTER — Other Ambulatory Visit (HOSPITAL_COMMUNITY)
Admission: RE | Admit: 2019-11-01 | Discharge: 2019-11-01 | Disposition: A | Discharge status: Home / Self Care | Payer: 59 | Source: Ambulatory Visit | Attending: Orthopedic Surgery | Admitting: Orthopedic Surgery

## 2019-11-01 ENCOUNTER — Other Ambulatory Visit: Payer: Self-pay

## 2019-11-01 ENCOUNTER — Encounter (HOSPITAL_COMMUNITY): Payer: Self-pay

## 2019-11-01 DIAGNOSIS — Z01811 Encounter for preprocedural respiratory examination: Secondary | ICD-10-CM

## 2019-11-01 LAB — SURGICAL PCR SCREEN
MRSA, PCR: NEGATIVE
Staphylococcus aureus: NEGATIVE

## 2019-11-01 LAB — COMPREHENSIVE METABOLIC PANEL
ALT: 22 U/L (ref 0–44)
AST: 20 U/L (ref 15–41)
Albumin: 4.1 g/dL (ref 3.5–5.0)
Alkaline Phosphatase: 58 U/L (ref 38–126)
Anion gap: 7 (ref 5–15)
BUN: 16 mg/dL (ref 6–20)
CO2: 27 mmol/L (ref 22–32)
Calcium: 9 mg/dL (ref 8.9–10.3)
Chloride: 107 mmol/L (ref 98–111)
Creatinine, Ser: 1.17 mg/dL (ref 0.61–1.24)
GFR calc Af Amer: >60 mL/min (ref >60)
GFR calc non Af Amer: >60 mL/min (ref >60)
Glucose, Bld: 100 mg/dL — ABNORMAL HIGH (ref 70–99)
Potassium: 4.3 mmol/L (ref 3.5–5.1)
Sodium: 141 mmol/L (ref 135–145)
Total Bilirubin: 1.2 mg/dL (ref 0.3–1.2)
Total Protein: 7.4 g/dL (ref 6.5–8.1)

## 2019-11-01 LAB — CBC WITH DIFFERENTIAL/PLATELET
Abs Immature Granulocytes: 0.07 10*3/uL (ref 0.00–0.07)
Basophils Absolute: 0.1 10*3/uL (ref 0.0–0.1)
Basophils Relative: 1 %
Eosinophils Absolute: 0.4 10*3/uL (ref 0.0–0.5)
Eosinophils Relative: 5 %
HCT: 42.7 % (ref 39.0–52.0)
Hemoglobin: 14.6 g/dL (ref 13.0–17.0)
Immature Granulocytes: 1 %
Lymphocytes Relative: 36 %
Lymphs Abs: 3 10*3/uL (ref 0.7–4.0)
MCH: 31.5 pg (ref 26.0–34.0)
MCHC: 34.2 g/dL (ref 30.0–36.0)
MCV: 92.2 fL (ref 80.0–100.0)
Monocytes Absolute: 0.7 10*3/uL (ref 0.1–1.0)
Monocytes Relative: 9 %
Neutro Abs: 4 10*3/uL (ref 1.7–7.7)
Neutrophils Relative %: 48 %
Platelets: 214 10*3/uL (ref 150–400)
RBC: 4.63 MIL/uL (ref 4.22–5.81)
RDW: 12 % (ref 11.5–15.5)
WBC: 8.2 10*3/uL (ref 4.0–10.5)
nRBC: 0 % (ref 0.0–0.2)

## 2019-11-01 LAB — PROTIME-INR
INR: 1 (ref 0.8–1.2)
Prothrombin Time: 13.3 seconds (ref 11.4–15.2)

## 2019-11-01 LAB — URINALYSIS, ROUTINE W REFLEX MICROSCOPIC
Bilirubin Urine: NEGATIVE
Glucose, UA: NEGATIVE mg/dL
Hgb urine dipstick: NEGATIVE
Ketones, ur: NEGATIVE mg/dL
Leukocytes,Ua: NEGATIVE
Nitrite: NEGATIVE
Protein, ur: NEGATIVE mg/dL
Specific Gravity, Urine: 1.018 (ref 1.005–1.030)
pH: 6 (ref 5.0–8.0)

## 2019-11-01 LAB — APTT: aPTT: 29 seconds (ref 24–36)

## 2019-11-01 IMAGING — CR DG CHEST 2V
2 series · 2 of 2 positions shown · non-contrast
Comparison: Chest x-ray [DATE].

CLINICAL DATA: Hypertension.

EXAM:
CHEST - 2 VIEW

[w chest pa *]
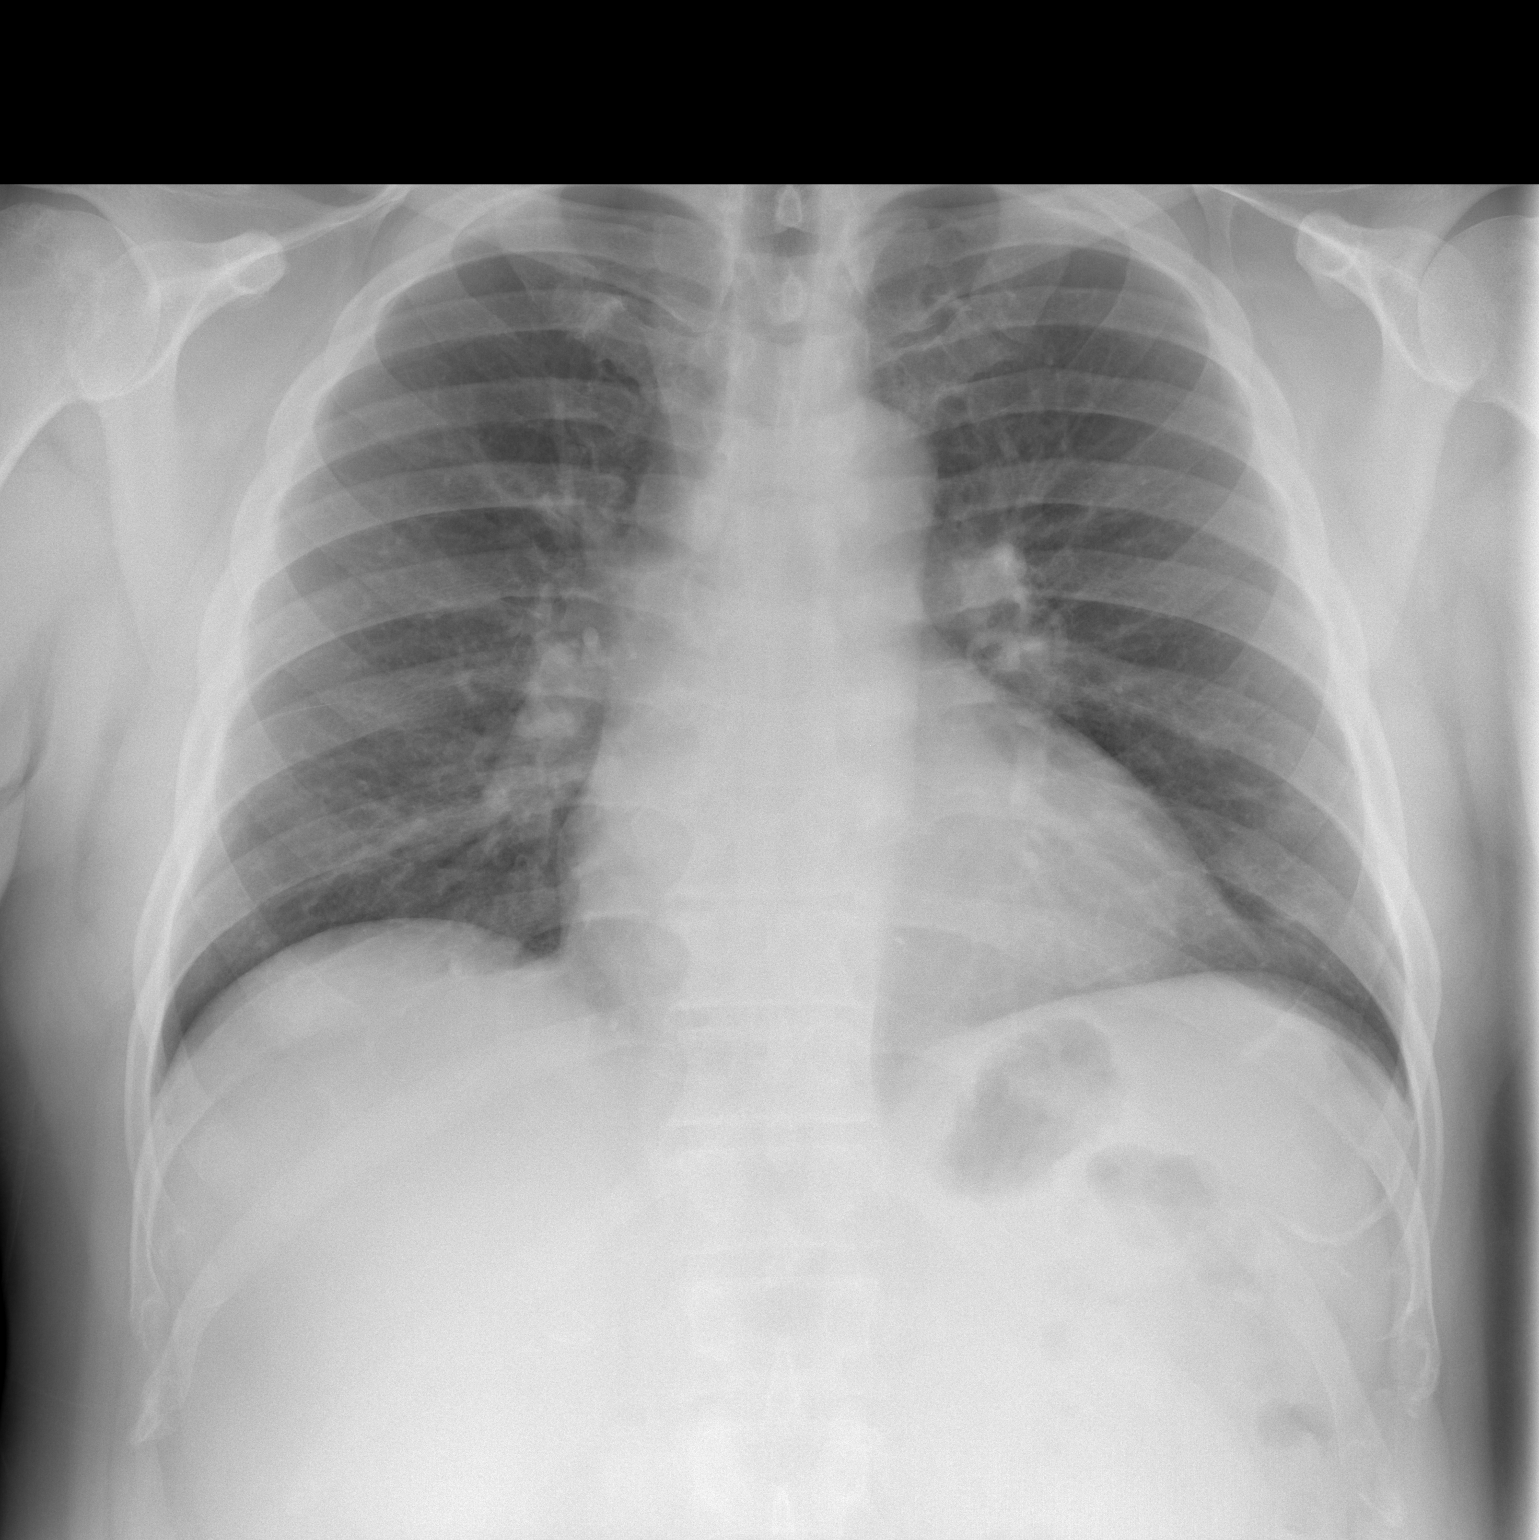

[w chest lat *]
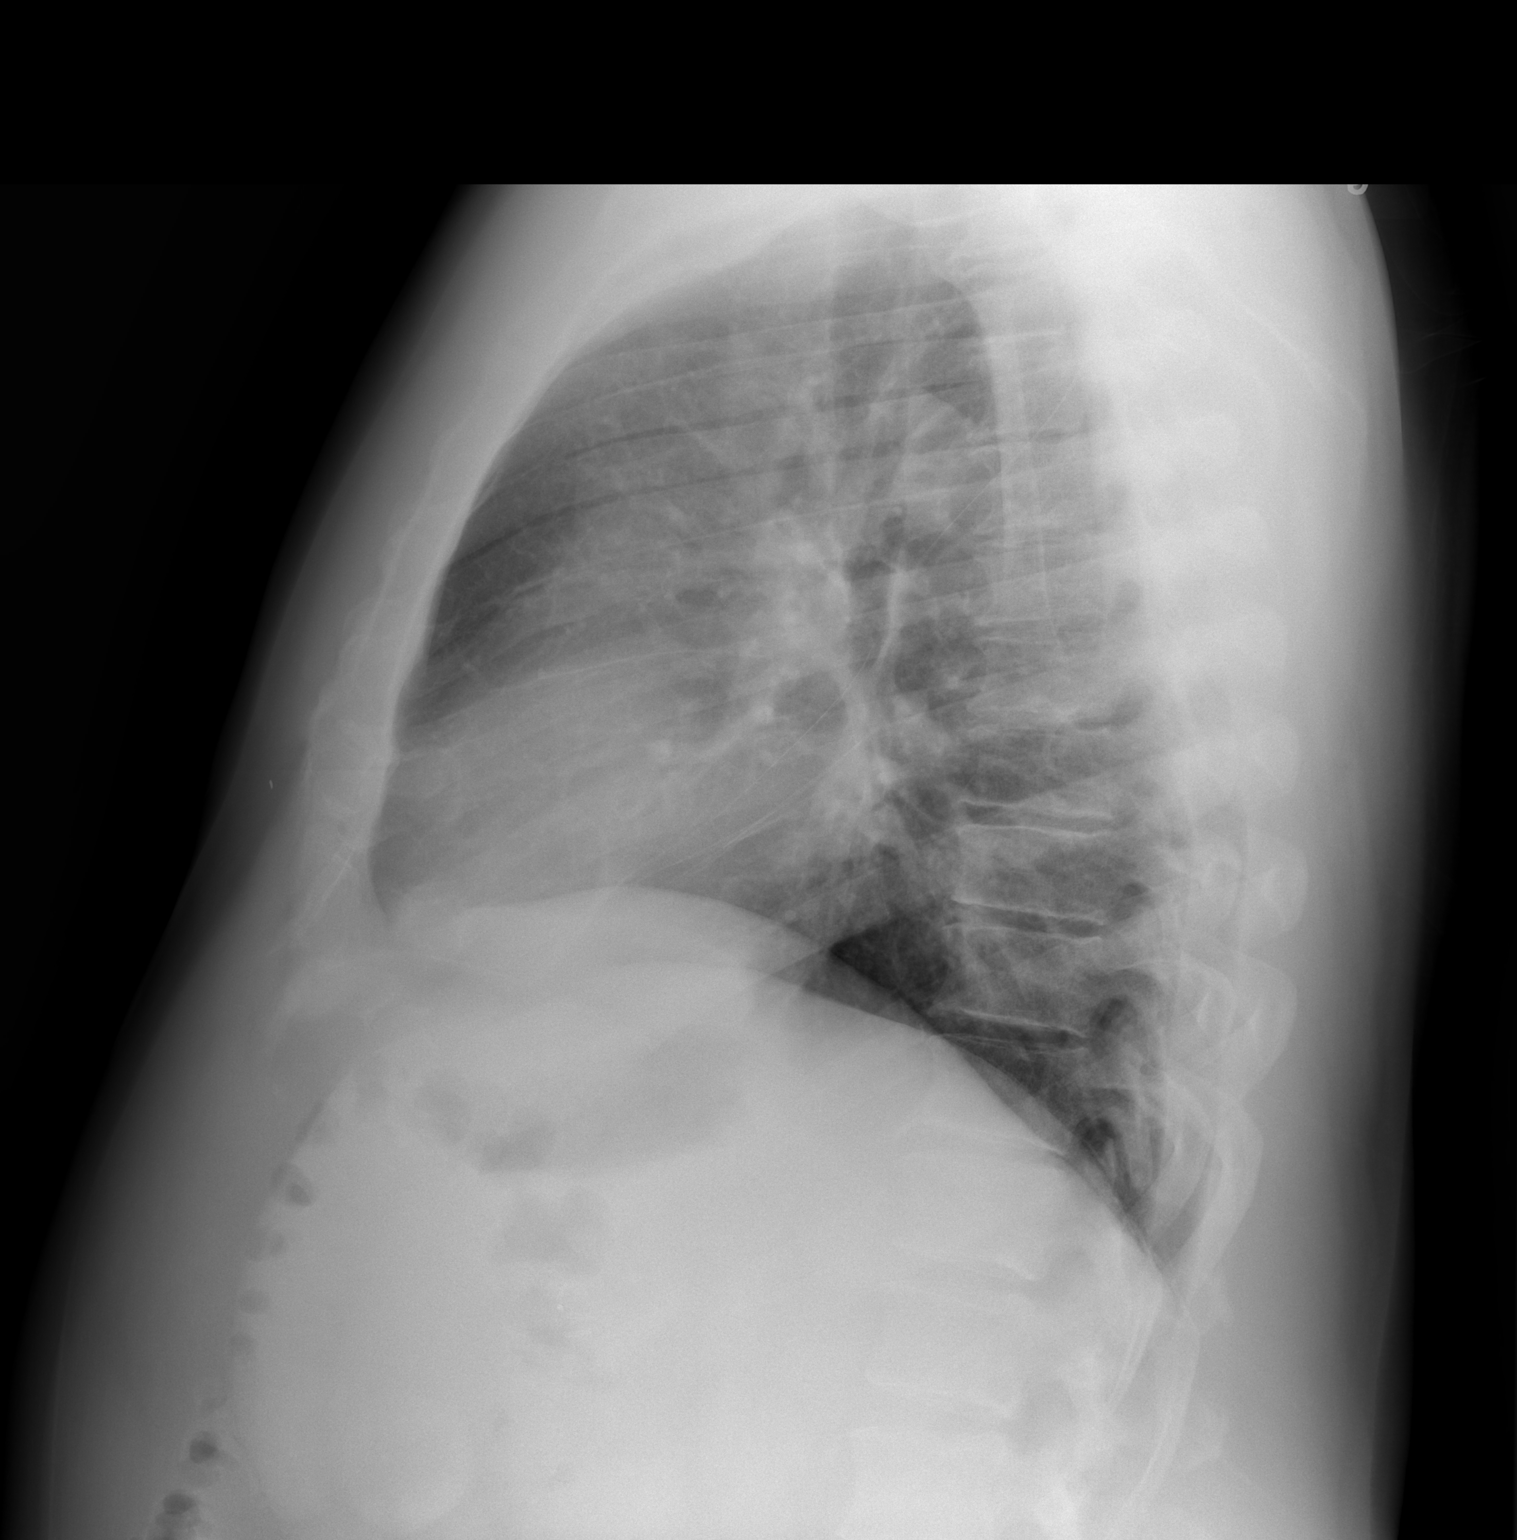

[2 of 2 positions shown; findings below may reference images not displayed]

FINDINGS: Mediastinum and hilar structures normal. Lungs are clear. No pleural
effusion pneumothorax. Heart size normal. No acute bony abnormality
identified.
IMPRESSION: No acute cardiopulmonary disease.

## 2019-11-01 NOTE — Progress Notes (Signed)
PCP - Dr. Inda Merlin @ [facility] Cardiologist -  Vascular: Dr. Scot Dock. LOV: 01/20/2019 Chest x-ray - 11/01/2019 EKG - 11/01/2019 Stress Test -  ECHO -  Cardiac Cath -   Sleep Study -  CPAP -   Fasting Blood Sugar -  Checks Blood Sugar _____ times a day  Blood Thinner Instructions: Aspirin Instructions: Last Dose:  Anesthesia review:   Patient denies shortness of breath, fever, cough and chest pain at PAT appointment   Patient verbalized understanding of instructions that were given to them at the PAT appointment. Patient was also instructed that they will need to review over the PAT instructions again at home before surgery.

## 2019-11-02 LAB — NOVEL CORONAVIRUS, NAA (HOSP ORDER, SEND-OUT TO REF LAB; TAT 18-24 HRS): SARS-CoV-2, NAA: NOT DETECTED

## 2019-11-03 MED ORDER — BUPIVACAINE LIPOSOME 1.3 % IJ SUSP
20.0000 mL | Freq: Once | INTRAMUSCULAR | Status: DC
  Filled 2019-11-03: qty 20

## 2019-11-03 NOTE — H&P (Signed)
TOTAL KNEE REVISION ADMISSION H&P  Patient is being admitted for right revision total knee arthroplasty.  Subjective:  Chief Complaint:right knee pain.  HPI: Reginald [name], 53 y.o. male, has a history of pain and functional disability in the right knee(s) due to failed previous arthroplasty and patient has failed non-surgical conservative treatments for greater than 12 weeks to include NSAID's and/or analgesics, use of assistive devices and activity modification. The indications for the revision of the total knee arthroplasty are ppainful total knee thathas not improved with prolonged conservative care. Onset of symptoms was gradual starting 1 years ago with gradually worsening course since that time.  Prior procedures on the right knee(s) include arthroplasty.  Patient currently rates pain in the right knee(s) at 7 out of 10 with activity. There is night pain, worsening of pain with activity and weight bearing, pain with passive range of motion, crepitus and joint swelling.  Patient has evidence of no radiographic abnormality by imaging studies. This condition presents safety issues increasing the risk of falls. This patient has had failure of all reasonable conservative care including total knee arthroplasty.  There is no current active infection.  Patient Active Problem List   Diagnosis Date Noted  . Primary osteoarthritis of right knee 03/31/2018  . Hypertension 12/02/2011  . Obesity (BMI 30-39.9) 12/02/2011   Past Medical History:  Diagnosis Date  . Arthritis   . Hypertension     Past Surgical History:  Procedure Laterality Date  . CHOLECYSTECTOMY  12/03/2011   Procedure: LAPAROSCOPIC CHOLECYSTECTOMY WITH INTRAOPERATIVE CHOLANGIOGRAM;  Surgeon: Gayland Curry, MD;  Location: [place];  Service: General;  Laterality: N/A;  laparoscopic cholecystectomy with intraoperative cholangiogram  . CHONDROPLASTY Right 09/19/2015   Procedure: CHONDROPLASTY;  Surgeon: Dorna Leitz, MD;  Location: [facility];  Service: Orthopedics;  Laterality: Right;  . ENDOVENOUS ABLATION SAPHENOUS VEIN W/ LASER Right 01/13/2019   endovenous laser ablation right greater saphenous vein and stab phlebectomy 10-20 incisions right leg by Deitra Mayo MD   . KNEE ARTHROSCOPY WITH LATERAL MENISECTOMY Right 09/19/2015   Procedure: KNEE ARTHROSCOPY WITH PARTIAL LATERAL MENISECTOMY;  Surgeon: Dorna Leitz, MD;  Location: [facility];  Service: Orthopedics;  Laterality: Right;  . KNEE ARTHROSCOPY WITH MEDIAL MENISECTOMY Right 09/19/2015   Procedure: KNEE ARTHROSCOPY WITH PARTIAL MEDIAL MENISECTOMY;  Surgeon: Dorna Leitz, MD;  Location: [place];  Service: Orthopedics;  Laterality: Right;  . spider bite     black widow or brown recluse  . TOE FUSION Left   . TOTAL KNEE ARTHROPLASTY Right 03/31/2018   Procedure: RIGHT TOTAL KNEE ARTHROPLASTY;  Surgeon: Dorna Leitz, MD;  Location: [facility];  Service: Orthopedics;  Laterality: Right;    Current Facility-Administered Medications  Medication Dose Route Frequency Provider Last Rate Last Admin  . [START ON 11/04/2019] bupivacaine liposome (EXPAREL) 1.3 % injection 266 mg  20 mL Other Once Dorna Leitz, MD       Current Outpatient Medications  Medication Sig Dispense Refill Last Dose  . acetaminophen (TYLENOL) 500 MG tablet Take 1,000 mg by mouth daily as needed for moderate pain or headache.     Marland Kitchen amLODipine (NORVASC) 10 MG tablet Take 10 mg by mouth daily.      Marland Kitchen losartan-hydrochlorothiazide (HYZAAR) 50-12.5 MG tablet Take 1 tablet by mouth daily.     . naproxen sodium (ALEVE) 220 MG tablet Take 440 mg by mouth daily as needed (pain).     . NORCO 5-325 MG tablet Take 1  tablet by mouth every 12 (twelve) hours as needed for pain.      Allergies  Allergen Reactions  . Gabapentin     Head cloudy   . Ibuprofen Nausea And Vomiting    High doses make him vomit.    Social History   Tobacco Use  . Smoking status: Never  Smoker  . Smokeless tobacco: Never Used  Substance Use Topics  . Alcohol use: Yes    Alcohol/week: 1.0 standard drinks    Types: 1 Cans of beer per week    Comment: occa.    Family History  Problem Relation Age of Onset  . Cancer Mother        breast  . Heart disease Mother       Review of Systems  Genitourinary: Positive for discharge.   Review of Systems  Genitourinary: Positive for discharge.    Objective:  Physical Exam  Vital signs in last 24 hours:   Well-developed well-nourished patient in no acute distress. Alert and oriented x3 HEENT:within normal limits Cardiac: Regular rate and rhythm Pulmonary: Lungs clear to auscultation Abdomen: Soft and nontender.  Normal active bowel sounds  Musculoskeletal: right knee: No effusion.  No instability.  Mild pain to range of motion. Labs: Recent Results (from the past 2160 hour(s))  Surgical pcr screen     Status: None   Collection Time: 11/01/19  8:49 AM   Specimen: Nasal Mucosa; Nasal Swab  Result Value Ref Range   MRSA, PCR NEGATIVE NEGATIVE   Staphylococcus aureus NEGATIVE NEGATIVE    Comment: (NOTE) The Xpert SA Assay (FDA approved for NASAL specimens in patients 54 years of age and older), is one component of a comprehensive surveillance program. It is not intended to diagnose infection nor to guide or monitor treatment. Performed at [facility], [address], [place], [place] [address]   APTT     Status: None   Collection Time: 11/01/19  8:49 AM  Result Value Ref Range   aPTT 29 24 - 36 seconds    Comment: Performed at [facility], [address] [place], [place] [address]  CBC WITH DIFFERENTIAL     Status: None   Collection Time: 11/01/19  8:49 AM  Result Value Ref Range   WBC 8.2 4.0 - 10.5 K/uL   RBC 4.63 4.22 - 5.81 MIL/uL   Hemoglobin 14.6 13.0 - 17.0 g/dL   HCT 82.9 93.7 - 16.9 %   MCV 92.2 80.0 - 100.0 fL   MCH 31.5 26.0 - 34.0 pg   MCHC 34.2 30.0 -  36.0 g/dL   RDW 67.8 93.8 - 10.1 %   Platelets 214 150 - 400 K/uL   nRBC 0.0 0.0 - 0.2 %   Neutrophils Relative % 48 %   Neutro Abs 4.0 1.7 - 7.7 K/uL   Lymphocytes Relative 36 %   Lymphs Abs 3.0 0.7 - 4.0 K/uL   Monocytes Relative 9 %   Monocytes Absolute 0.7 0.1 - 1.0 K/uL   Eosinophils Relative 5 %   Eosinophils Absolute 0.4 0.0 - 0.5 K/uL   Basophils Relative 1 %   Basophils Absolute 0.1 0.0 - 0.1 K/uL   Immature Granulocytes 1 %   Abs Immature Granulocytes 0.07 0.00 - 0.07 K/uL    Comment: Performed at [facility], [address], [place], [place] [address]  Comprehensive metabolic panel     Status: Abnormal   Collection Time: 11/01/19  8:49 AM  Result  Value Ref Range   Sodium 141 135 - 145 mmol/L   Potassium 4.3 3.5 - 5.1 mmol/L   Chloride 107 98 - 111 mmol/L   CO2 27 22 - 32 mmol/L   Glucose, Bld 100 (H) 70 - 99 mg/dL   BUN 16 6 - 20 mg/dL   Creatinine, Ser 8.29 0.61 - 1.24 mg/dL   Calcium 9.0 8.9 - 56.2 mg/dL   Total Protein 7.4 6.5 - 8.1 g/dL   Albumin 4.1 3.5 - 5.0 g/dL   AST 20 15 - 41 U/L   ALT 22 0 - 44 U/L   Alkaline Phosphatase 58 38 - 126 U/L   Total Bilirubin 1.2 0.3 - 1.2 mg/dL   GFR calc non Af Amer >60 >60 mL/min   GFR calc Af Amer >60 >60 mL/min   Anion gap 7 5 - 15    Comment: Performed at [facility], [address], [place], [place] [address]  Protime-INR     Status: None   Collection Time: 11/01/19  8:49 AM  Result Value Ref Range   Prothrombin Time 13.3 11.4 - 15.2 seconds   INR 1.0 0.8 - 1.2    Comment: (NOTE) INR goal varies based on device and disease states. Performed at [facility], [address], [place], [place] [address]   Type and screen Order type and screen if day of surgery is less than 15 days from draw of preadmission visit or order morning of surgery if day of surgery is greater than 6 days from preadmission visit.     Status: None   Collection Time: 11/01/19  8:49 AM   Result Value Ref Range   ABO/RH(D) O NEG    Antibody Screen NEG    Sample Expiration 11/15/2019,2359    Extend sample reason      NO TRANSFUSIONS OR PREGNANCY IN THE PAST 3 MONTHS Performed at [facility], [address] [place], [place] [address]   Urinalysis, Routine w reflex microscopic     Status: Abnormal   Collection Time: 11/01/19  8:49 AM  Result Value Ref Range   Color, Urine AMBER (A) YELLOW    Comment: BIOCHEMICALS MAY BE AFFECTED BY COLOR   APPearance CLEAR CLEAR   Specific Gravity, Urine 1.018 1.005 - 1.030   pH 6.0 5.0 - 8.0   Glucose, UA NEGATIVE NEGATIVE mg/dL   Hgb urine dipstick NEGATIVE NEGATIVE   Bilirubin Urine NEGATIVE NEGATIVE   Ketones, ur NEGATIVE NEGATIVE mg/dL   Protein, ur NEGATIVE NEGATIVE mg/dL   Nitrite NEGATIVE NEGATIVE   Leukocytes,Ua NEGATIVE NEGATIVE    Comment: Performed at [facility], [address] [place], [place] [address]  Novel Coronavirus, NAA [facility] order, Send-out to Ref Lab; TAT 18-24 hrs     Status: None   Collection Time: 11/01/19  2:27 PM   Specimen: Nasopharyngeal Swab; Respiratory  Result Value Ref Range   SARS-CoV-2, NAA NOT DETECTED NOT DETECTED    Comment: (NOTE) Testing was performed using the cobas(R) SARS-CoV-2 test. This nucleic acid amplification test was developed and its performance characteristics determined by [employer]. Nucleic acid amplification tests include PCR and TMA. This test has not been FDA cleared or approved. This test has been authorized by FDA under an Emergency Use Authorization (EUA). This test is only authorized for the duration of time the declaration that circumstances exist justifying the authorization of the emergency use of in vitro diagnostic tests for detection of SARS-CoV-2 virus  and/or diagnosis of COVID-19 infection under section 564(b)(1) of the Act, 21 U.S.C. 161WRU-0(A360bbb-3(b) (1), unless the authorization is terminated or revoked  sooner. When diagnostic testing is negative, the possibility of a false negative result should be considered in the context of a patient's recent exposures and the presence of clinical signs and symptoms consistent with COVID-19. An individual without s ymptoms of COVID- 19 and who is not shedding SARS-CoV-2 virus would expect to have a negative (not detected) result in this assay. Performed At: [facility] [place] [address] [place], [place] [id number] Jolene SchimkeNagendra Sanjai MD YN:[id number]:[phone]    Coronavirus Source NASOPHARYNGEAL     Comment: Performed at [facility], [address] [place], [place] [address]   Estimated body mass index is 37.77 kg/m as calculated from the following:   Height as of 11/01/19: 5\' 10"  (1.778 m).   Weight as of 11/01/19: 119.4 kg.  Imaging Review Plain radiographs demonstrate severe degenerative joint disease of the right knee(s). The overall alignment is neutral.There is evidence of loosening of the none of the components components. The bone quality appears to be fair for age and reported activity level. There is no obvious abnormality on radiographs or bone scan.    Assessment/Plan:  End stage arthritis, right knee(s) with failed previous arthroplasty.   The patient history, physical examination, clinical judgment of the provider and imaging studies are consistent with end stage degenerative joint disease of the right knee(s), previous total knee arthroplasty. Revision total knee arthroplasty is deemed medically necessary. The treatment options including medical management, injection therapy, arthroscopy and revision arthroplasty were discussed at length. The risks and benefits of revision total knee arthroplasty were presented and reviewed. The risks due to aseptic loosening, infection, stiffness, patella tracking problems, thromboembolic complications and other imponderables were discussed. The patient acknowledged the explanation, agreed to proceed  with the plan and consent was signed. Patient is being admitted for inpatient treatment for surgery, pain control, PT, OT, prophylactic antibiotics, VTE prophylaxis, progressive ambulation and ADL's and discharge planning.The patient is planning to be discharged home with home health services he may be a good candidate for same day discharge if he has no component revision and/or tibial revision only.

## 2019-11-04 ENCOUNTER — Encounter (HOSPITAL_COMMUNITY)
Admission: RE | Disposition: A | Discharge status: Home / Self Care | Payer: Self-pay | Source: Ambulatory Visit | Attending: Orthopedic Surgery

## 2019-11-04 ENCOUNTER — Inpatient Hospital Stay (HOSPITAL_COMMUNITY)
Admission: RE | Admit: 2019-11-04 | Discharge: 2019-11-04 | DRG: 468 | Disposition: A | Discharge status: Home / Self Care | Payer: 59 | Attending: Orthopedic Surgery | Admitting: Orthopedic Surgery

## 2019-11-04 ENCOUNTER — Other Ambulatory Visit: Payer: Self-pay

## 2019-11-04 ENCOUNTER — Inpatient Hospital Stay (HOSPITAL_COMMUNITY): Payer: 59 | Admitting: Physician Assistant

## 2019-11-04 ENCOUNTER — Inpatient Hospital Stay (HOSPITAL_COMMUNITY): Payer: 59 | Admitting: Certified Registered Nurse Anesthetist

## 2019-11-04 ENCOUNTER — Encounter (HOSPITAL_COMMUNITY): Payer: Self-pay | Admitting: Orthopedic Surgery

## 2019-11-04 DIAGNOSIS — M24661 Ankylosis, right knee: Secondary | ICD-10-CM | POA: Diagnosis present

## 2019-11-04 DIAGNOSIS — Z20828 Contact with and (suspected) exposure to other viral communicable diseases: Secondary | ICD-10-CM | POA: Diagnosis present

## 2019-11-04 DIAGNOSIS — E669 Obesity, unspecified: Secondary | ICD-10-CM | POA: Diagnosis present

## 2019-11-04 DIAGNOSIS — I10 Essential (primary) hypertension: Secondary | ICD-10-CM | POA: Diagnosis present

## 2019-11-04 DIAGNOSIS — T8484XA Pain due to internal orthopedic prosthetic devices, implants and grafts, initial encounter: Secondary | ICD-10-CM | POA: Diagnosis present

## 2019-11-04 DIAGNOSIS — Z6837 Body mass index (BMI) 37.0-37.9, adult: Secondary | ICD-10-CM | POA: Diagnosis not present

## 2019-11-04 DIAGNOSIS — Y792 Prosthetic and other implants, materials and accessory orthopedic devices associated with adverse incidents: Secondary | ICD-10-CM | POA: Diagnosis present

## 2019-11-04 DIAGNOSIS — Z96651 Presence of right artificial knee joint: Secondary | ICD-10-CM | POA: Diagnosis present

## 2019-11-04 DIAGNOSIS — T84032A Mechanical loosening of internal right knee prosthetic joint, initial encounter: Principal | ICD-10-CM | POA: Diagnosis present

## 2019-11-04 DIAGNOSIS — Z8249 Family history of ischemic heart disease and other diseases of the circulatory system: Secondary | ICD-10-CM | POA: Diagnosis not present

## 2019-11-04 DIAGNOSIS — Z888 Allergy status to other drugs, medicaments and biological substances status: Secondary | ICD-10-CM

## 2019-11-04 DIAGNOSIS — M1711 Unilateral primary osteoarthritis, right knee: Secondary | ICD-10-CM | POA: Diagnosis present

## 2019-11-04 DIAGNOSIS — M25561 Pain in right knee: Secondary | ICD-10-CM | POA: Diagnosis present

## 2019-11-04 DIAGNOSIS — Z79899 Other long term (current) drug therapy: Secondary | ICD-10-CM | POA: Diagnosis not present

## 2019-11-04 HISTORY — PX: TOTAL KNEE REVISION: SHX996

## 2019-11-04 LAB — TYPE AND SCREEN
ABO/RH(D): O NEG
Antibody Screen: NEGATIVE

## 2019-11-04 SURGERY — TOTAL KNEE REVISION
Anesthesia: Regional | Site: Knee | Laterality: Right

## 2019-11-04 MED ORDER — HYDROMORPHONE HCL 1 MG/ML IJ SOLN
INTRAMUSCULAR | Status: AC
  Administered 2019-11-04: 0.5 mg via INTRAVENOUS
  Filled 2019-11-04: qty 1

## 2019-11-04 MED ORDER — PROPOFOL 10 MG/ML IV BOLUS
INTRAVENOUS | Status: AC
  Filled 2019-11-04: qty 20

## 2019-11-04 MED ORDER — OXYCODONE HCL 5 MG PO TABS
5.0000 mg | ORAL_TABLET | Freq: Once | ORAL | Status: AC | PRN
  Administered 2019-11-04: 5 mg via ORAL

## 2019-11-04 MED ORDER — MIDAZOLAM HCL 2 MG/2ML IJ SOLN
1.0000 mg | INTRAMUSCULAR | Status: DC
  Administered 2019-11-04: 1 mg via INTRAVENOUS
  Filled 2019-11-04: qty 2

## 2019-11-04 MED ORDER — CHLORHEXIDINE GLUCONATE 4 % EX LIQD: 60.0000 mL | Freq: Once | CUTANEOUS | Status: DC

## 2019-11-04 MED ORDER — OXYCODONE-ACETAMINOPHEN 5-325 MG PO TABS: 1.0000 | ORAL_TABLET | Freq: Four times a day (QID) | ORAL | 0 refills | Status: DC | PRN

## 2019-11-04 MED ORDER — LACTATED RINGERS IV SOLN: INTRAVENOUS | Status: DC

## 2019-11-04 MED ORDER — TRANEXAMIC ACID-NACL 1000-0.7 MG/100ML-% IV SOLN
1000.0000 mg | INTRAVENOUS | Status: AC
  Administered 2019-11-04: 1000 mg via INTRAVENOUS
  Filled 2019-11-04: qty 100

## 2019-11-04 MED ORDER — DEXAMETHASONE SODIUM PHOSPHATE 10 MG/ML IJ SOLN
INTRAMUSCULAR | Status: DC | PRN
  Administered 2019-11-04: 10 mg via INTRAVENOUS

## 2019-11-04 MED ORDER — LIDOCAINE 2% (20 MG/ML) 5 ML SYRINGE
INTRAMUSCULAR | Status: AC
  Filled 2019-11-04: qty 5

## 2019-11-04 MED ORDER — ACETAMINOPHEN 10 MG/ML IV SOLN: 1000.0000 mg | Freq: Once | INTRAVENOUS | Status: DC | PRN

## 2019-11-04 MED ORDER — ROPIVACAINE HCL 7.5 MG/ML IJ SOLN
INTRAMUSCULAR | Status: DC | PRN
  Administered 2019-11-04: 20 mL via PERINEURAL

## 2019-11-04 MED ORDER — MIDAZOLAM HCL 2 MG/2ML IJ SOLN
INTRAMUSCULAR | Status: DC | PRN
  Administered 2019-11-04: 2 mg via INTRAVENOUS

## 2019-11-04 MED ORDER — BUPIVACAINE HCL (PF) 0.5 % IJ SOLN
INTRAMUSCULAR | Status: AC
  Filled 2019-11-04: qty 30

## 2019-11-04 MED ORDER — SODIUM CHLORIDE 0.9 % IR SOLN
Status: DC | PRN
  Administered 2019-11-04: 1000 mL

## 2019-11-04 MED ORDER — DEXAMETHASONE SODIUM PHOSPHATE 10 MG/ML IJ SOLN
INTRAMUSCULAR | Status: AC
  Filled 2019-11-04: qty 1

## 2019-11-04 MED ORDER — LIDOCAINE-EPINEPHRINE 2 %-1:100000 IJ SOLN
INTRAMUSCULAR | Status: DC | PRN
  Administered 2019-11-04: 10 mL via PERINEURAL

## 2019-11-04 MED ORDER — FENTANYL CITRATE (PF) 100 MCG/2ML IJ SOLN
INTRAMUSCULAR | Status: AC
  Filled 2019-11-04: qty 2

## 2019-11-04 MED ORDER — 0.9 % SODIUM CHLORIDE (POUR BTL) OPTIME
TOPICAL | Status: DC | PRN
  Administered 2019-11-04: 11:00:00 1000 mL

## 2019-11-04 MED ORDER — HYDRALAZINE HCL 20 MG/ML IJ SOLN
INTRAMUSCULAR | Status: AC
  Filled 2019-11-04: qty 1

## 2019-11-04 MED ORDER — ONDANSETRON HCL 4 MG/2ML IJ SOLN
INTRAMUSCULAR | Status: AC
  Filled 2019-11-04: qty 2

## 2019-11-04 MED ORDER — HYDRALAZINE HCL 20 MG/ML IJ SOLN
10.0000 mg | Freq: Once | INTRAMUSCULAR | Status: AC
  Administered 2019-11-04: 10 mg via INTRAVENOUS

## 2019-11-04 MED ORDER — ASPIRIN EC 325 MG PO TBEC: 325.0000 mg | DELAYED_RELEASE_TABLET | Freq: Two times a day (BID) | ORAL | 0 refills | Status: DC

## 2019-11-04 MED ORDER — HYDROMORPHONE HCL 1 MG/ML IJ SOLN
0.2500 mg | INTRAMUSCULAR | Status: DC | PRN
  Administered 2019-11-04: 0.5 mg via INTRAVENOUS

## 2019-11-04 MED ORDER — OXYCODONE HCL 5 MG PO TABS
ORAL_TABLET | ORAL | Status: AC
  Filled 2019-11-04: qty 1

## 2019-11-04 MED ORDER — ACETAMINOPHEN 160 MG/5ML PO SOLN: 1000.0000 mg | Freq: Once | ORAL | Status: DC | PRN

## 2019-11-04 MED ORDER — SODIUM CHLORIDE (PF) 0.9 % IJ SOLN
INTRAMUSCULAR | Status: AC
  Filled 2019-11-04: qty 50

## 2019-11-04 MED ORDER — ACETAMINOPHEN 10 MG/ML IV SOLN
INTRAVENOUS | Status: DC | PRN
  Administered 2019-11-04: 1000 mg via INTRAVENOUS

## 2019-11-04 MED ORDER — IRRISEPT - 450ML BOTTLE WITH 0.05% CHG IN STERILE WATER, USP 99.95% OPTIME
TOPICAL | Status: DC | PRN
  Administered 2019-11-04: 11:00:00 450 mL

## 2019-11-04 MED ORDER — FENTANYL CITRATE (PF) 100 MCG/2ML IJ SOLN
INTRAMUSCULAR | Status: DC | PRN
  Administered 2019-11-04 (×2): 50 ug via INTRAVENOUS
  Administered 2019-11-04: 100 ug via INTRAVENOUS

## 2019-11-04 MED ORDER — LACTATED RINGERS IV BOLUS
500.0000 mL | Freq: Once | INTRAVENOUS | Status: AC
  Administered 2019-11-04: 500 mL via INTRAVENOUS

## 2019-11-04 MED ORDER — STERILE WATER FOR IRRIGATION IR SOLN
Status: DC | PRN
  Administered 2019-11-04 (×2): 1000 mL

## 2019-11-04 MED ORDER — CEFAZOLIN SODIUM-DEXTROSE 2-4 GM/100ML-% IV SOLN: 2.0000 g | Freq: Once | INTRAVENOUS | Status: DC

## 2019-11-04 MED ORDER — FENTANYL CITRATE (PF) 100 MCG/2ML IJ SOLN: 25.0000 ug | INTRAMUSCULAR | Status: DC | PRN

## 2019-11-04 MED ORDER — KETAMINE HCL 10 MG/ML IJ SOLN
INTRAMUSCULAR | Status: AC
  Filled 2019-11-04: qty 1

## 2019-11-04 MED ORDER — CEFAZOLIN SODIUM-DEXTROSE 2-4 GM/100ML-% IV SOLN
2.0000 g | INTRAVENOUS | Status: AC
  Administered 2019-11-04: 2 g via INTRAVENOUS
  Filled 2019-11-04: qty 100

## 2019-11-04 MED ORDER — ONDANSETRON HCL 4 MG/2ML IJ SOLN
INTRAMUSCULAR | Status: DC | PRN
  Administered 2019-11-04: 4 mg via INTRAVENOUS

## 2019-11-04 MED ORDER — OXYCODONE HCL 5 MG PO TABS: 5.0000 mg | ORAL_TABLET | Freq: Once | ORAL | Status: DC | PRN

## 2019-11-04 MED ORDER — TIZANIDINE HCL 4 MG PO TABS: 4.0000 mg | ORAL_TABLET | Freq: Three times a day (TID) | ORAL | 0 refills | Status: AC | PRN

## 2019-11-04 MED ORDER — LIDOCAINE 2% (20 MG/ML) 5 ML SYRINGE
INTRAMUSCULAR | Status: DC | PRN
  Administered 2019-11-04: 60 mg via INTRAVENOUS

## 2019-11-04 MED ORDER — TRANEXAMIC ACID-NACL 1000-0.7 MG/100ML-% IV SOLN: 1000.0000 mg | Freq: Once | INTRAVENOUS | Status: DC

## 2019-11-04 MED ORDER — ACETAMINOPHEN 500 MG PO TABS: 1000.0000 mg | ORAL_TABLET | Freq: Once | ORAL | Status: DC | PRN

## 2019-11-04 MED ORDER — DOCUSATE SODIUM 100 MG PO CAPS: 100.0000 mg | ORAL_CAPSULE | Freq: Two times a day (BID) | ORAL | 0 refills | Status: DC

## 2019-11-04 MED ORDER — PROPOFOL 500 MG/50ML IV EMUL
INTRAVENOUS | Status: DC | PRN
  Administered 2019-11-04: 200 mg via INTRAVENOUS

## 2019-11-04 MED ORDER — SODIUM CHLORIDE FLUSH 0.9 % IV SOLN
INTRAVENOUS | Status: DC | PRN
  Administered 2019-11-04: 11:00:00 100 mL

## 2019-11-04 MED ORDER — MIDAZOLAM HCL 2 MG/2ML IJ SOLN
INTRAMUSCULAR | Status: AC
  Filled 2019-11-04: qty 2

## 2019-11-04 MED ORDER — KETAMINE HCL 10 MG/ML IJ SOLN
INTRAMUSCULAR | Status: DC | PRN
  Administered 2019-11-04 (×2): 20 mg via INTRAVENOUS
  Administered 2019-11-04: 40 mg via INTRAVENOUS

## 2019-11-04 MED ORDER — OXYCODONE HCL 5 MG/5ML PO SOLN: 5.0000 mg | Freq: Once | ORAL | Status: DC | PRN

## 2019-11-04 MED ORDER — EPHEDRINE 5 MG/ML INJ
INTRAVENOUS | Status: AC
  Filled 2019-11-04: qty 10

## 2019-11-04 MED ORDER — PHENYLEPHRINE 40 MCG/ML (10ML) SYRINGE FOR IV PUSH (FOR BLOOD PRESSURE SUPPORT)
PREFILLED_SYRINGE | INTRAVENOUS | Status: AC
  Filled 2019-11-04: qty 10

## 2019-11-04 MED ORDER — OXYCODONE HCL 5 MG/5ML PO SOLN: 5.0000 mg | Freq: Once | ORAL | Status: AC | PRN

## 2019-11-04 MED ORDER — POVIDONE-IODINE 10 % EX SWAB
2.0000 "application " | Freq: Once | CUTANEOUS | Status: AC
  Administered 2019-11-04: 2 via TOPICAL

## 2019-11-04 MED ORDER — ACETAMINOPHEN 10 MG/ML IV SOLN
INTRAVENOUS | Status: AC
  Filled 2019-11-04: qty 100

## 2019-11-04 MED ORDER — FENTANYL CITRATE (PF) 100 MCG/2ML IJ SOLN
50.0000 ug | INTRAMUSCULAR | Status: DC
  Administered 2019-11-04: 50 ug via INTRAVENOUS
  Filled 2019-11-04: qty 2

## 2019-11-04 SURGICAL SUPPLY — 59 items
ATTUNE PSRP INSR SZ5 6 KNEE (Insert) ×1 IMPLANT
ATTUNE PSRP INSR SZ5 6MM KNEE (Insert) ×1 IMPLANT
BAG ZIPLOCK 12X15 (MISCELLANEOUS) ×3 IMPLANT
BLADE SAGITTAL 25.0X1.19X90 (BLADE) ×2 IMPLANT
BLADE SAGITTAL 25.0X1.19X90MM (BLADE) ×1
BLADE SAW SGTL 13.0X1.19X90.0M (BLADE) ×3 IMPLANT
BLADE SAW SGTL 81X20 HD (BLADE) IMPLANT
BLADE SURG SZ10 CARB STEEL (BLADE) ×6 IMPLANT
BNDG ELASTIC 4X5.8 VLCR STR LF (GAUZE/BANDAGES/DRESSINGS) ×3 IMPLANT
BNDG ELASTIC 6X5.8 VLCR STR LF (GAUZE/BANDAGES/DRESSINGS) ×3 IMPLANT
BOOTIES KNEE HIGH SLOAN (MISCELLANEOUS) ×5 IMPLANT
BOWL SMART MIX CTS (DISPOSABLE) ×3 IMPLANT
COVER WAND RF STERILE (DRAPES) IMPLANT
DECANTER SPIKE VIAL GLASS SM (MISCELLANEOUS) ×3 IMPLANT
DRAPE U-SHAPE 47X51 STRL (DRAPES) ×3 IMPLANT
DRSG AQUACEL AG ADV 3.5X10 (GAUZE/BANDAGES/DRESSINGS) ×3 IMPLANT
DRSG PAD ABDOMINAL 8X10 ST (GAUZE/BANDAGES/DRESSINGS) ×3 IMPLANT
ELECT REM PT RETURN 15FT ADLT (MISCELLANEOUS) ×3 IMPLANT
GAUZE SPONGE 4X4 12PLY STRL (GAUZE/BANDAGES/DRESSINGS) ×3 IMPLANT
GAUZE XEROFORM 1X8 LF (GAUZE/BANDAGES/DRESSINGS) ×2 IMPLANT
GLOVE BIOGEL PI IND STRL 8 (GLOVE) ×2 IMPLANT
GLOVE BIOGEL PI INDICATOR 8 (GLOVE) ×4
GLOVE ECLIPSE 7.5 STRL STRAW (GLOVE) ×6 IMPLANT
GOWN STRL REUS W/TWL XL LVL3 (GOWN DISPOSABLE) ×8 IMPLANT
HANDPIECE INTERPULSE COAX TIP (DISPOSABLE) ×2
HOLDER FOLEY CATH W/STRAP (MISCELLANEOUS) IMPLANT
HOOD PEEL AWAY FLYTE STAYCOOL (MISCELLANEOUS) ×9 IMPLANT
IMMOBILIZER KNEE 20 (SOFTGOODS)
IMMOBILIZER KNEE 20 THIGH 36 (SOFTGOODS) IMPLANT
JET LAVAGE IRRISEPT WOUND (IRRIGATION / IRRIGATOR) ×3
KIT TURNOVER KIT A (KITS) IMPLANT
LAVAGE JET IRRISEPT WOUND (IRRIGATION / IRRIGATOR) IMPLANT
MANIFOLD NEPTUNE II (INSTRUMENTS) ×3 IMPLANT
NDL SAFETY ECLIPSE 18X1.5 (NEEDLE) IMPLANT
NEEDLE HYPO 18GX1.5 SHARP (NEEDLE)
NEEDLE HYPO 22GX1.5 SAFETY (NEEDLE) ×2 IMPLANT
NS IRRIG 1000ML POUR BTL (IV SOLUTION) ×3 IMPLANT
PACK TOTAL KNEE CUSTOM (KITS) ×3 IMPLANT
PADDING CAST COTTON 6X4 STRL (CAST SUPPLIES) ×6 IMPLANT
PENCIL SMOKE EVACUATOR (MISCELLANEOUS) IMPLANT
PROTECTOR NERVE ULNAR (MISCELLANEOUS) ×3 IMPLANT
SET HNDPC FAN SPRY TIP SCT (DISPOSABLE) ×1 IMPLANT
STAPLER VISISTAT 35W (STAPLE) ×2 IMPLANT
SUT BONE WAX W31G (SUTURE) ×2 IMPLANT
SUT VIC AB 0 CT1 27 (SUTURE) ×4
SUT VIC AB 0 CT1 27XBRD ANTBC (SUTURE) ×2 IMPLANT
SUT VIC AB 1 CT1 27 (SUTURE) ×4
SUT VIC AB 1 CT1 27XBRD ANTBC (SUTURE) ×2 IMPLANT
SUT VIC AB 2-0 CT1 27 (SUTURE) ×2
SUT VIC AB 2-0 CT1 TAPERPNT 27 (SUTURE) ×1 IMPLANT
SWAB COLLECTION DEVICE MRSA (MISCELLANEOUS) ×3 IMPLANT
SWAB CULTURE ESWAB REG 1ML (MISCELLANEOUS) ×3 IMPLANT
SYR 3ML LL SCALE MARK (SYRINGE) IMPLANT
TOWER CARTRIDGE SMART MIX (DISPOSABLE) IMPLANT
TRAY FOLEY MTR SLVR 16FR STAT (SET/KITS/TRAYS/PACK) ×3 IMPLANT
WATER STERILE IRR 1000ML POUR (IV SOLUTION) ×3 IMPLANT
WRAP KNEE MAXI GEL POST OP (GAUZE/BANDAGES/DRESSINGS) ×2 IMPLANT
YANKAUER SUCT BULB TIP 10FT TU (MISCELLANEOUS) ×3 IMPLANT
YANKAUER SUCT BULB TIP NO VENT (SUCTIONS) ×3 IMPLANT

## 2019-11-04 NOTE — Discharge Instructions (Signed)

## 2019-11-04 NOTE — Brief Op Note (Signed)
11/04/2019  11:32 AM  PATIENT:  Reginald [name]  53 y.o. male  PRE-OPERATIVE DIAGNOSIS:  PAINFUL RIGHT KNEE STATUS POST TOTAL KNEE ARTHROPLASTY  POST-OPERATIVE DIAGNOSIS:  PAINFUL RIGHT KNEE STATUS POST TOTAL KNEE ARTHROPLASTY  PROCEDURE:  Procedure(s): RIGHT TOTAL KNEE REVISION (Right)  SURGEON:  Surgeon(s) and Role:    Dorna Leitz, MD - Primary  PHYSICIAN ASSISTANT:   ASSISTANTS: jim bethune   ANESTHESIA:   general  EBL:  minimal   BLOOD ADMINISTERED:none  DRAINS: none   LOCAL MEDICATIONS USED:  MARCAINE    and OTHER experel  SPECIMEN:  Source of Specimen:  r knee  DISPOSITION OF SPECIMEN:  PATHOLOGY  COUNTS:  YES  TOURNIQUET:   Total Tourniquet Time Documented: Thigh (Right) - 41 minutes Total: Thigh (Right) - 41 minutes   DICTATION: .Other Dictation: Dictation Number [id number]  PLAN OF CARE: Discharge to home after PACU  PATIENT DISPOSITION:  PACU - hemodynamically stable.   Delay start of Pharmacological VTE agent (>24hrs) due to surgical blood loss or risk of bleeding: no

## 2019-11-04 NOTE — Progress Notes (Signed)
AssistedDr. Moser with right, ultrasound guided, adductor canal block. Side rails up, monitors on throughout procedure. See vital signs in flow sheet. Tolerated Procedure well.  

## 2019-11-04 NOTE — Interval H&P Note (Signed)
History and Physical Interval Note:  11/04/2019 9:34 AM  Reginald [name]  has presented today for surgery, with the diagnosis of PAINFUL RIGHT KNEE STATUS POST TOTAL KNEE ARTHROPLASTY.  The various methods of treatment have been discussed with the patient and family. After consideration of risks, benefits and other options for treatment, the patient has consented to  Procedure(s): RIGHT TOTAL KNEE REVISION (Right) as a surgical intervention.  The patient's history has been reviewed, patient examined, no change in status, stable for surgery.  I have reviewed the patient's chart and labs.  Questions were answered to the patient's satisfaction.     Alta Corning

## 2019-11-04 NOTE — Op Note (Signed)
NAME: Reginald [name], Reginald [name] MEDICAL RECORD KD:[id number] ACCOUNT [id number] DATE OF BIRTH:1966/06/17 FACILITY: [facility] LOCATION: [facility] PHYSICIAN:Che[place]ha Behlke Maudie Mercury, MD  OPERATIVE REPORT  DATE OF PROCEDURE:  11/04/2019  PREOPERATIVE DIAGNOSIS:  Painful right total knee.  POSTOPERATIVE DIAGNOSIS:  Painful right total knee.  PROCEDURE:  Right total knee revision of tibial component by way of polyethylene swab with extensive synovectomy.  SURGEON:  Dorna Leitz, MD  ASSISTANT:  Gaspar Skeeters PA-C, who was present for the entire case and assisted by manipulation of the leg, retraction and closing to minimize OR time.  BRIEF HISTORY:  The patient is a 53 year old male with a long history of complaints of right knee pain.  He had a right total knee replacement and had done great in terms of his pain around the joint line, but was having a lot of pain up in the upper  thigh.  We talked about treatment options, but felt that his x-rays look good, alignment looked good, stability felt quite good.  He just continued to complain of pain.  We did an infection workup, which was essentially negative.  He did have a high  rheumatoid factor.  I sent him to a rheumatologist who felt that there was no need to put him on any kind of systemic rheumatoid therapy.  He continued to complain of pain.  We had long discussions, but ultimately felt that evaluation of the knee and  thorough synovectomy would be appropriate.  He was brought to the operating room for this procedure.  DESCRIPTION OF PROCEDURE:  The patient brought to the operating room.  After adequate anesthesia was obtained with general anesthetic, the patient was placed on the operating table.  The right leg was prepped and draped in usual sterile fashion.   Following this, the leg was exsanguinated, tourniquet inflated to 300 mmHg.  Following this, a midline incision was made in subcutaneous tissue down to the extensor mechanism.  A medial parapatellar  arthrotomy was opened.  Fluid was sent from the knee  down for stat Gram stain and culture.  Extensive and thorough synovectomy was performed, both superiorly, medially, laterally and inferiorly.  We then everted the patella.  We got back behind,  took the poly out, did an extensive synovectomy from the  posterior capsule as well.  Once all this had been completed, we then looked to trial a poly, so I trial him with a 6.  We could get it in flexion, extension, felt great.  Trialed with a 7.  Flexion was tight in extension and really could not get him  into full extension, so I felt that a 6 might give him a little bit more stability in the knee, although stability did not seem to be the big issue.  We then washed it out good.  I took some chlorhexidine fluid and put in throughout the knee just for any  additional potential antibacterial effect.  Once this was done, we opened the final 6 poly and placed it and then did a closure of the medial parapatellar arthrotomy.  At this point, the subcutaneous was closed with 0 and 2-0 Vicryl and skin staples.   Sterile compressive dressing was applied.  The patient was taken to recovery.  He was noted to be in satisfactory condition.  We will attempt to discharge him from the recovery room today.  I see him back in the office in 2 weeks.  VN/NUANCE  D:11/04/2019 T:11/04/2019 JOB:[id number]

## 2019-11-04 NOTE — Anesthesia Procedure Notes (Signed)
Procedure Name: LMA Insertion Date/Time: 11/04/2019 9:46 AM Performed by: Gerald Leitz, CRNA Pre-anesthesia Checklist: Patient identified, Patient being monitored, Timeout performed, Emergency Drugs available and Suction available Patient Re-evaluated:Patient Re-evaluated prior to induction Oxygen Delivery Method: Circle system utilized Preoxygenation: Pre-oxygenation with 100% oxygen Induction Type: IV induction Ventilation: Mask ventilation without difficulty LMA: LMA inserted LMA Size: 4.0 Tube type: Oral Number of attempts: 1 Placement Confirmation: positive ETCO2 and breath sounds checked- equal and bilateral Tube secured with: Tape Dental Injury: Teeth and Oropharynx as per pre-operative assessment

## 2019-11-04 NOTE — Transfer of Care (Signed)
Immediate Anesthesia Transfer of Care Note  Patient: Reginald [name]  Procedure(s) Performed: Procedure(s): RIGHT TOTAL KNEE REVISION (Right)  Patient Location: PACU  Anesthesia Type:General  Level of Consciousness: Alert, Awake, Oriented  Airway & Oxygen Therapy: Patient Spontanous Breathing  Post-op Assessment: Report given to RN  Post vital signs: Reviewed and stable  Last Vitals:  Vitals:   11/04/19 1142 11/04/19 1145  BP: (!) 155/102 (!) 161/105  Pulse: (P) 81 91  Resp: (P) 18 (!) 23  Temp:    SpO2: (P) 992% 42%    Complications: No apparent anesthesia complications

## 2019-11-04 NOTE — Anesthesia Procedure Notes (Signed)
Anesthesia Regional Block: Adductor canal block   Pre-Anesthetic Checklist: ,, timeout performed, Correct Patient, Correct Site, Correct Laterality, Correct Procedure, Correct Position, site marked, Risks and benefits discussed,  Surgical consent,  Pre-op evaluation,  At surgeon's request and post-op pain management  Laterality: Right and Lower  Prep: chloraprep       Needles:  Injection technique: Single-shot     Needle Length: 9cm  Needle Gauge: 22     Additional Needles: Arrow StimuQuik ECHO Echogenic Stimulating PNB Needle  Procedures:,,,, ultrasound used (permanent image in chart),,,,  Narrative:  Start time: 11/04/2019 9:21 AM End time: 11/04/2019 9:28 AM Injection made incrementally with aspirations every 5 mL.  Performed by: Personally  Anesthesiologist: Oleta Mouse, MD

## 2019-11-04 NOTE — Progress Notes (Signed)
Dr Berenice Primas reported pt to be discharged home, pt has equipment from prior surgery.

## 2019-11-04 NOTE — Evaluation (Signed)
Physical Therapy Evaluation Patient Details Name: Reginald [name] MRN: [id number] DOB: 1966/09/14 Today's Date: 11/04/2019   History of Present Illness  Pt is 53 y.o. male s/p Rt TKR on 11/04/19 with PMH significant for OA and HTN with Rt TKA performed on 03/31/18.  Clinical Impression  Reginald [name] is a 53 y.o. male POD 0 s/p Rt TKR. Patient reports independence with mobility at baseline. Patient is now limited by functional impairments (see PT problem list below) and requires supervision/min guard for transfers and gait with RW. Patient was able to ambulate ~150 feet with RW and min guard and demosntrated safe walker management. Patient educated on safe sequencing for stair mobility and verbalized safe guarding position for people assisting with mobility. Patient instructed in exercises to facilitate ROM and circulation. Patient will benefit from continued skilled PT interventions to address impairments and progress towards PLOF. Patient has met mobility goals at adequate level for discharge home; will continue to follow if pt continues acute stay to progress towards Mod I goals.     Follow Up Recommendations Follow surgeon's recommendation for DC plan and follow-up therapies    Equipment Recommendations  None recommended by PT    Recommendations for Other Services       Precautions / Restrictions Precautions Precautions: Fall Restrictions Weight Bearing Restrictions: No      Mobility  Bed Mobility Overal bed mobility: Needs Assistance Bed Mobility: Supine to Sit;Sit to Supine     Supine to sit: Supervision;HOB elevated Sit to supine: HOB elevated   General bed mobility comments: no assistance required and no cues needed  Transfers Overall transfer level: Needs assistance Equipment used: Rolling walker (2 wheeled) Transfers: Sit to/from Stand Sit to Stand: Supervision         General transfer comment: cues for safe hand placement and technique with RW, no assist  required and pt demonstrated safe use throughout  Ambulation/Gait Ambulation/Gait assistance: Supervision Gait Distance (Feet): 150 Feet Assistive device: Rolling walker (2 wheeled) Gait Pattern/deviations: Step-through pattern;Decreased stride length;Decreased stance time - right Gait velocity: WFL's   General Gait Details: pt maintained safe proximity to RW and safe hand placement throughout and no overt LOB noted  Stairs Stairs: Yes Stairs assistance: Supervision;Min guard Stair Management: Step to pattern;Backwards;Forwards;One rail Right;With walker Number of Stairs: 7(2x3, 1 step) General stair comments: cues for safe step pattern for "up with good, down with bad" and for sequencing backward step up technique to ascend single step. pt verbalized safe guarding position for those helping him.  Wheelchair Mobility    Modified Rankin (Stroke Patients Only)       Balance Overall balance assessment: Mild deficits observed, not formally tested                Pertinent Vitals/Pain Pain Assessment: 0-10 Pain Score: 2  Pain Location: Rt knee Pain Descriptors / Indicators: Aching;Sore Pain Intervention(s): Limited activity within patient's tolerance;Monitored during session    Rafael Hernandez expects to be discharged to:: Private residence Living Arrangements: Spouse/significant other Available Help at Discharge: Family Type of Home: House Home Access: Stairs to enter Entrance Stairs-Rails: None Entrance Stairs-Number of Steps: 1 step at front door and then the threshold or just 1 step into house from garage [place]: Two level;Bed/bath upstairs;Able to live on main level with bedroom/bathroom Home Equipment: Gilford Rile - 2 wheels;Cane - single point;Crutches      Prior Function Level of Independence: Independent         Comments: pt is  independent and works as a Psychologist, sport and exercise   Dominant Hand: Right     Extremity/Trunk Assessment   Upper Extremity Assessment Upper Extremity Assessment: Overall WFL for tasks assessed    Lower Extremity Assessment Lower Extremity Assessment: Overall WFL for tasks assessed;RLE deficits/detail RLE Deficits / Details: no extensor lag with SLR RLE Sensation: WNL RLE Coordination: WNL    Cervical / Trunk Assessment Cervical / Trunk Assessment: Normal  Communication   Communication: No difficulties  Cognition Arousal/Alertness: Awake/alert Behavior During Therapy: WFL for tasks assessed/performed Overall Cognitive Status: Within Functional Limits for tasks assessed             General Comments      Exercises Total Joint Exercises Ankle Circles/Pumps: AROM;20 reps;Supine;Both Quad Sets: AROM;5 reps;Supine;Right Short Arc Quad: AROM;5 reps;Supine;Right Hip ABduction/ADduction: AROM;5 reps;Supine;Right Long Arc Quad: AROM;Seated;5 reps;Right Knee Flexion: AAROM;5 reps;Seated;Right   Assessment/Plan    PT Assessment Patient needs continued PT services  PT Problem List Decreased strength;Decreased range of motion;Decreased balance;Decreased activity tolerance;Decreased mobility       PT Treatment Interventions DME instruction;Functional mobility training;Balance training;Patient/family education;Therapeutic activities;Gait training;Stair training;Therapeutic exercise    PT Goals (Current goals can be found in the Care Plan section)  Acute Rehab PT Goals Patient Stated Goal: to go home today and get back to independence PT Goal Formulation: With patient Time For Goal Achievement: 11/11/19 Potential to Achieve Goals: Good    Frequency 7X/week    AM-PAC PT "6 Clicks" Mobility  Outcome Measure Help needed turning from your back to your side while in a flat bed without using bedrails?: None Help needed moving from lying on your back to sitting on the side of a flat bed without using bedrails?: None Help needed moving to and from a bed to a  chair (including a wheelchair)?: A Little Help needed standing up from a chair using your arms (e.g., wheelchair or bedside chair)?: A Little Help needed to walk in hospital room?: A Little Help needed climbing 3-5 steps with a railing? : A Little 6 Click Score: 20    End of Session Equipment Utilized During Treatment: Gait belt Activity Tolerance: Patient tolerated treatment well Patient left: in bed;with call bell/phone within reach Nurse Communication: Mobility status PT Visit Diagnosis: Muscle weakness (generalized) (M62.81);Difficulty in walking, not elsewhere classified (R26.2)    Time: 2595-6387 PT Time Calculation (min) (ACUTE ONLY): 39 min   Charges:   PT Evaluation $PT Eval Low Complexity: 1 Low PT Treatments $Gait Training: 8-22 mins $Therapeutic Exercise: 8-22 mins        Kipp Brood, PT, DPT Physical Therapist with [facility]  11/04/2019 3:21 PM

## 2019-11-04 NOTE — Anesthesia Preprocedure Evaluation (Signed)
Anesthesia Evaluation  Patient identified by MRN, date of birth, ID band Patient awake    Reviewed: Allergy & Precautions, NPO status , Patient's Chart, lab work & pertinent test results  History of Anesthesia Complications Negative for: history of anesthetic complications  Airway Mallampati: II  TM Distance: >3 FB Neck ROM: Full    Dental  (+) Dental Advisory Given, Teeth Intact   Pulmonary neg shortness of breath, neg recent URI,    breath sounds clear to auscultation       Cardiovascular hypertension, Pt. on medications (-) angina(-) Past MI and (-) CHF  Rhythm:Regular     Neuro/Psych negative neurological ROS  negative psych ROS   GI/Hepatic negative GI ROS, Neg liver ROS,   Endo/Other  negative endocrine ROS  Renal/GU negative Renal ROS     Musculoskeletal  (+) Arthritis ,   Abdominal   Peds  Hematology negative hematology ROS (+)   Anesthesia Other Findings   Reproductive/Obstetrics                             Anesthesia Physical Anesthesia Plan  ASA: II  Anesthesia Plan: General and Regional   Post-op Pain Management:  Regional for Post-op pain   Induction: Intravenous  PONV Risk Score and Plan: 2 and Ondansetron and Dexamethasone  Airway Management Planned: LMA  Additional Equipment: None  Intra-op Plan:   Post-operative Plan: Extubation in OR  Informed Consent: I have reviewed the patients History and Physical, chart, labs and discussed the procedure including the risks, benefits and alternatives for the proposed anesthesia with the patient or authorized representative who has indicated his/her understanding and acceptance.     Dental advisory given  Plan Discussed with: CRNA and Surgeon  Anesthesia Plan Comments:         Anesthesia Quick Evaluation

## 2019-11-07 LAB — BODY FLUID CULTURE: Culture: NO GROWTH

## 2019-11-07 LAB — SURGICAL PATHOLOGY

## 2019-11-08 ENCOUNTER — Encounter: Payer: Self-pay | Admitting: *Deleted

## 2019-11-08 NOTE — Anesthesia Postprocedure Evaluation (Signed)
Anesthesia Post Note  Patient: Reginald [name]  Procedure(s) Performed: RIGHT TOTAL KNEE REVISION (Right Knee)     Patient location during evaluation: PACU Anesthesia Type: General and Regional Level of consciousness: awake and alert Pain management: pain level controlled Vital Signs Assessment: post-procedure vital signs reviewed and stable Respiratory status: spontaneous breathing, nonlabored ventilation, respiratory function stable and patient connected to nasal cannula oxygen Cardiovascular status: blood pressure returned to baseline and stable Postop Assessment: no apparent nausea or vomiting Anesthetic complications: no    Last Vitals:  Vitals:   11/04/19 1230 11/04/19 1245  BP: (!) 150/98 (!) 145/94  Pulse: 73 67  Resp: 19 15  Temp:  36.7 C  SpO2: 99% 94%    Last Pain:  Vitals:   11/04/19 1245  TempSrc:   PainSc: 3                  Zidane Renner

## 2019-11-09 LAB — AEROBIC/ANAEROBIC CULTURE W GRAM STAIN (SURGICAL/DEEP WOUND)
Culture: NO GROWTH
Gram Stain: NONE SEEN

## 2019-11-09 LAB — ANAEROBIC CULTURE: Gram Stain: NONE SEEN

## 2019-11-18 NOTE — Discharge Summary (Signed)
Patient ID: Reginald [name] MRN: [id number] DOB/AGE: Jul 08, 1966 53 y.o.  Admit date: 11/04/2019 Discharge date: 11/04/2019 Admission Diagnoses:  Principal Problem:   Painful total knee replacement, right (HCC) Active Problems:   Arthrofibrosis of knee joint, right   S/P revision of total knee, right   Discharge Diagnoses:  Same  Past Medical History:  Diagnosis Date  . Arthritis   . Hypertension     Surgeries: Procedure(s): RIGHT TOTAL KNEE REVISION on 11/04/2019   Discharged Condition: Improved  Hospital Course: Reginald [name] is an 54 y.o. male who was admitted 11/04/2019 for operative treatment ofPainful total knee replacement, right (HCC). Patient has severe unremitting pain that affects sleep, daily activities, and work/hobbies. After pre-op clearance the patient was taken to the operating room on 11/04/2019 and underwent  Procedure(s): RIGHT TOTAL KNEE REVISION.    Patient was given perioperative antibiotics:  Anti-infectives (From admission, onward)   Start     Dose/Rate Route Frequency Ordered Stop   11/04/19 1230  ceFAZolin (ANCEF) IVPB 2g/100 mL premix  Status:  Discontinued     2 g 200 mL/hr over 30 Minutes Intravenous  Once 11/04/19 1220 11/04/19 1825   11/04/19 0715  ceFAZolin (ANCEF) IVPB 2g/100 mL premix     2 g 200 mL/hr over 30 Minutes Intravenous On call to O.R. 11/04/19 0707 11/04/19 1023       Patient was given sequential compression devices, early ambulation, and chemoprophylaxis to prevent DVT.  Patient benefited maximally from hospital stay and there were no complications.  The patient was discharged home from the recovery room and was seen by physical therapy in the recovery room and did satisfactorily.  He was felt to be stable and safe to be discharged directly home with home health physical therapy.   Recent laboratory studies: No results for input(s): WBC, HGB, HCT, PLT, NA, K, CL, CO2, BUN, CREATININE, GLUCOSE, INR, CALCIUM in the last 72  hours.  Invalid input(s): PT, 2   Discharge Medications:   Allergies as of 11/04/2019      Reactions   Gabapentin    Head cloudy    Ibuprofen Nausea And Vomiting   High doses make him vomit.      Medication List    STOP taking these medications   naproxen sodium 220 MG tablet Commonly known as: ALEVE   Norco 5-325 MG tablet Generic drug: HYDROcodone-acetaminophen     TAKE these medications   acetaminophen 500 MG tablet Commonly known as: TYLENOL Take 1,000 mg by mouth daily as needed for moderate pain or headache.   amLODipine 10 MG tablet Commonly known as: NORVASC Take 10 mg by mouth daily.   aspirin EC 325 MG tablet Take 1 tablet (325 mg total) by mouth 2 (two) times daily after a meal. Take x 1 month post op to decrease risk of blood clots.   docusate sodium 100 MG capsule Commonly known as: Colace Take 1 capsule (100 mg total) by mouth 2 (two) times daily.   losartan-hydrochlorothiazide 50-12.5 MG tablet Commonly known as: HYZAAR Take 1 tablet by mouth daily.   oxyCODONE-acetaminophen 5-325 MG tablet Commonly known as: PERCOCET/ROXICET Take 1-2 tablets by mouth every 6 (six) hours as needed for severe pain.   tiZANidine 4 MG tablet Commonly known as: Zanaflex Take 1 tablet (4 mg total) by mouth every 8 (eight) hours as needed for up to 14 days for muscle spasms.            Discharge Care Instructions  (From  admission, onward)         Start     Ordered   11/04/19 0000  Weight bearing as tolerated    Question Answer Comment  Laterality right   Extremity Lower      11/04/19 1217          Diagnostic Studies: DG Chest 2 View  Result Date: 11/01/2019 CLINICAL DATA:  Hypertension. EXAM: CHEST - 2 VIEW COMPARISON:  Chest x-ray 03/24/2018. FINDINGS: Mediastinum and hilar structures normal. Lungs are clear. No pleural effusion pneumothorax. Heart size normal. No acute bony abnormality identified. IMPRESSION: No acute cardiopulmonary disease.  Electronically Signed   By: Marcello Moores  Register   On: 11/01/2019 09:51    Disposition: Discharge disposition: 01-Home or Self Care       Discharge Instructions    CPM   Complete by: As directed    Continuous passive motion machine (CPM):      Use the CPM from  0 degrees  to 70 degrees  for 6-8 hours per day.      You may increase by 10 degrees per day.  You may break it up into 2 or 3 sessions per day.      Use CPM for 1-2 weeks or until you are told to stop.   Call MD / Call 911   Complete by: As directed    If you experience chest pain or shortness of breath, CALL 911 and be transported to the hospital emergency room.  If you develope a fever above 101 F, pus (white drainage) or increased drainage or redness at the wound, or calf pain, call your surgeon's office.   Constipation Prevention   Complete by: As directed    Drink plenty of fluids.  Prune juice may be helpful.  You may use a stool softener, such as Colace (over the counter) 100 mg twice a day.  Use MiraLax (over the counter) for constipation as needed.   Diet general   Complete by: As directed    Increase activity slowly as tolerated   Complete by: As directed    Weight bearing as tolerated   Complete by: As directed    Laterality: right   Extremity: Lower      Follow-up Information    Dorna Leitz, MD. Schedule an appointment as soon as possible for a visit in 2 weeks.   Specialty: Orthopedic Surgery Contact information: [place] [address] [phone]            Signed: Erlene Senters 11/18/2019, 5:03 PM

## 2020-03-28 ENCOUNTER — Encounter: Payer: Self-pay | Admitting: *Deleted

## 2020-03-28 ENCOUNTER — Other Ambulatory Visit: Payer: Self-pay | Admitting: Orthopedic Surgery

## 2020-03-28 ENCOUNTER — Ambulatory Visit
Admission: RE | Admit: 2020-03-28 | Discharge: 2020-03-28 | Disposition: A | Discharge status: Home / Self Care | Payer: 59 | Source: Ambulatory Visit | Attending: Orthopedic Surgery | Admitting: Orthopedic Surgery

## 2020-03-28 ENCOUNTER — Other Ambulatory Visit: Payer: Self-pay

## 2020-03-28 DIAGNOSIS — M25569 Pain in unspecified knee: Secondary | ICD-10-CM

## 2020-03-28 HISTORY — PX: IR RADIOLOGIST EVAL & MGMT: IMG5224

## 2020-03-28 NOTE — Consult Note (Signed)
Chief Complaint: Right knee pain post right total knee revision.   Referring Physician(s): Graves,Tarrin Lebow  History of Present Illness: Reginald [name] is a 54 y.o. male with past medical history significant for hypertension who is seen today via telemedicine consultation for evaluation of persistent right knee pain post total knee replacement and revision.   The patient initially underwent a meniscus repair in 2016 (he denies history of previous knee injury/trauma) and ultimately underwent a right total knee replacement on 03/31/2018 with subsequent revision performed 11/04/2019.  Unfortunately the patient states that his knee pain has persisted despite undergoing the revision.   He currently rates his pain as 2-5/10, worse with activity.  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  He ambulates without an assistive device.   Past Medical History:  Diagnosis Date  . Arthritis   . Hypertension     Past Surgical History:  Procedure Laterality Date  . CHOLECYSTECTOMY  12/03/2011   Procedure: LAPAROSCOPIC CHOLECYSTECTOMY WITH INTRAOPERATIVE CHOLANGIOGRAM;  Surgeon: Gayland Curry, MD;  Location: [facility];  Service: General;  Laterality: N/A;  laparoscopic cholecystectomy with intraoperative cholangiogram  . CHONDROPLASTY Right 09/19/2015   Procedure: CHONDROPLASTY;  Surgeon: Dorna Leitz, MD;  Location: [facility];  Service: Orthopedics;  Laterality: Right;  . ENDOVENOUS ABLATION SAPHENOUS VEIN W/ LASER Right 01/13/2019   endovenous laser ablation right greater saphenous vein and stab phlebectomy 10-20 incisions right leg by Deitra Mayo MD   . KNEE ARTHROSCOPY WITH LATERAL MENISECTOMY Right 09/19/2015   Procedure: KNEE ARTHROSCOPY WITH PARTIAL LATERAL MENISECTOMY;  Surgeon: Dorna Leitz, MD;  Location: [facility];  Service:  Orthopedics;  Laterality: Right;  . KNEE ARTHROSCOPY WITH MEDIAL MENISECTOMY Right 09/19/2015   Procedure: KNEE ARTHROSCOPY WITH PARTIAL MEDIAL MENISECTOMY;  Surgeon: Dorna Leitz, MD;  Location: [facility];  Service: Orthopedics;  Laterality: Right;  . spider bite     black widow or brown recluse  . TOE FUSION Left   . TOTAL KNEE ARTHROPLASTY Right 03/31/2018   Procedure: RIGHT TOTAL KNEE ARTHROPLASTY;  Surgeon: Dorna Leitz, MD;  Location: [facility];  Service: Orthopedics;  Laterality: Right;  . TOTAL KNEE REVISION Right 11/04/2019   Procedure: RIGHT TOTAL KNEE REVISION;  Surgeon: Dorna Leitz, MD;  Location: [facility];  Service: Orthopedics;  Laterality: Right;    Allergies: Gabapentin and Ibuprofen  Medications: Prior to Admission medications   Medication Sig Start Date End Date Taking? Authorizing Provider  acetaminophen (TYLENOL) 500 MG tablet Take 1,000 mg by mouth daily as needed for moderate pain or headache.    [provider]  amLODipine (NORVASC) 10 MG tablet Take 10 mg by mouth daily.  12/12/14   [provider]  aspirin EC 325 MG tablet Take 1 tablet (325 mg total) by mouth 2 (two) times daily after a meal. Take x 1 month post op to decrease risk of blood clots. 11/04/19   Gary Fleet, PA-C  docusate sodium (COLACE) 100 MG capsule Take 1 capsule (100 mg total) by mouth 2 (two) times daily. 11/04/19   Gary Fleet, PA-C  losartan-hydrochlorothiazide (HYZAAR) 50-12.5 MG tablet Take 1 tablet by mouth daily.    [provider]  oxyCODONE-acetaminophen (PERCOCET/ROXICET) 5-325 MG tablet Take 1-2 tablets by mouth every 6 (six) hours as needed for severe pain. 11/04/19   Gary Fleet, PA-C  Family History  Problem Relation Age of Onset  . Cancer Mother        breast  . Heart disease Mother     Social History   Socioeconomic History  . Marital status: Married    Spouse name: Not on file  . Number of children: Not on file  . Years  of education: Not on file  . Highest education level: Not on file  Occupational History  . Not on file  Tobacco Use  . Smoking status: Never Smoker  . Smokeless tobacco: Never Used  Substance and Sexual Activity  . Alcohol use: Yes    Alcohol/week: 1.0 standard drinks    Types: 1 Cans of beer per week    Comment: occa.  . Drug use: No  . Sexual activity: Not on file  Other Topics Concern  . Not on file  Social History Narrative  . Not on file   Social Determinants of Health   Financial Resource Strain:   . Difficulty of Paying Living Expenses:   Food Insecurity:   . Worried About Programme researcher, broadcasting/film/video in the Last Year:   . Barista in the Last Year:   Transportation Needs:   . Freight forwarder (Medical):   Marland Kitchen Lack of Transportation (Non-Medical):   Physical Activity:   . Days of Exercise per Week:   . Minutes of Exercise per Session:   Stress:   . Feeling of Stress :   Social Connections:   . Frequency of Communication with Friends and Family:   . Frequency of Social Gatherings with Friends and Family:   . Attends Religious Services:   . Active Member of Clubs or Organizations:   . Attends Banker Meetings:   Marland Kitchen Marital Status:     ECOG Status: 1 - Symptomatic but completely ambulatory  Review of Systems  Review of Systems: A 12 point ROS discussed and pertinent positives are indicated in the HPI above.  All other systems are negative.  Physical Exam No direct physical exam was performed (except for noted visual exam findings with Video Visits).   Vital Signs: There were no vitals taken for this visit.  Imaging: No results found.  Labs:  CBC: Recent Labs    11/01/19 0849  WBC 8.2  HGB 14.6  HCT 42.7  PLT 214    COAGS: Recent Labs    11/01/19 0849  INR 1.0  APTT 29    BMP: Recent Labs    11/01/19 0849  NA 141  K 4.3  CL 107  CO2 27  GLUCOSE 100*  BUN 16  CALCIUM 9.0  CREATININE 1.17  GFRNONAA >60  GFRAA  >60    LIVER FUNCTION TESTS: Recent Labs    11/01/19 0849  BILITOT 1.2  AST 20  ALT 22  ALKPHOS 58  PROT 7.4  ALBUMIN 4.1    TUMOR MARKERS: No results for input(s): AFPTM, CEA, CA199, CHROMGRNA in the last 8760 hours.  Assessment and Plan:  Reginald [name] is a 54 y.o. male with past medical history significant for hypertension who is seen today via telemedicine consultation for evaluation of persistent right knee pain post total knee replacement and revision.   The patient initially underwent a meniscus repair in 2016 (he denies history of previous knee injury/trauma) and ultimately underwent a right total knee replacement on 03/31/2018 with subsequent revision performed 11/04/2019.  Unfortunately the patient states that his knee pain has persisted despite undergoing the revision.  He currently rates his pain as 2-5/10, worse with activity.  I explained that the IOVERA treatment is a new modality that utilizes cyroneurolysis technology to temporarily alter the pain recepting nerves supplying the anterior aspect of the knee, in hopes of achieving a clinically significant reduction in knee pain.     I explained that if successful, the patient will experience a rapid pain reduction which can last for approximately 3 months.  I explained that while their knee pain may be reduced, the structural damage of the knee remains, and thus, this is not a curative technology and their knee pain will return.     Risks associated with the procedure include bleeding/bruising, infection and post procedural paresthesias/weakness.   The procedure is performed as an outpatient basis at [facility].  The procedure is performed soley with local anesthesia, and therefore the patient does need to be NPO, there is no postprocedural recovery and the patient does not need a driver home.  Additionally, the patient does not have to hold any other their medications, including anticoagulation.  On the  day of the procedure, the patient is encouraged to wear either shorts or loose fitting pants that can be rolled up to the upper thigh.     Following this prolonged and detailed conversation, the patient wishes to pursue IOVERA therapy of the right knee.    As such, pending insurance approval, this procedure would be scheduled at [facility] at the next earliest convenience (the patient states Tuesdays are desirable days as he is often off).  The patient knows to call the interventional radiology clinic with any interval questions or concerns  Thank you for this interesting consult.  I greatly enjoyed meeting Reginald [name] and look forward to participating in their care.  A copy of this report was sent to the requesting provider on this date.  Electronically Signed: Simonne Come 03/28/2020, 2:16 PM   I spent a total of 15 Minutes in remote  clinical consultation, greater than 50% of which was counseling/coordinating care for right knee pain.    Visit type: Audio only (telephone). Audio (no video) only due to patient's lack of internet/smartphone capability. Alternative for in-person consultation at [facility], [address] [place], [place], [place]. This visit type was conducted due to national recommendations for restrictions regarding the COVID-19 Pandemic (e.g. social distancing).  This format is felt to be most appropriate for this patient at this time.  All issues noted in this document were discussed and addressed.

## 2020-03-29 ENCOUNTER — Other Ambulatory Visit (HOSPITAL_COMMUNITY): Payer: Self-pay | Admitting: Interventional Radiology

## 2020-03-29 DIAGNOSIS — M25561 Pain in right knee: Secondary | ICD-10-CM

## 2020-04-03 ENCOUNTER — Other Ambulatory Visit: Payer: Self-pay

## 2020-04-03 ENCOUNTER — Ambulatory Visit (HOSPITAL_COMMUNITY)
Admission: RE | Admit: 2020-04-03 | Discharge: 2020-04-03 | Disposition: A | Discharge status: Home / Self Care | Payer: 59 | Source: Ambulatory Visit | Attending: Interventional Radiology | Admitting: Interventional Radiology

## 2020-04-03 DIAGNOSIS — M25561 Pain in right knee: Secondary | ICD-10-CM | POA: Insufficient documentation

## 2020-04-03 HISTORY — PX: IR ABLATE LIVER CRYOABLATION: IMG5524

## 2020-04-03 IMAGING — US IR US GUIDED PERIPHERAL NERVE CRYOABLATION KNEE RIGHT
1 series · 12 of 20 positions shown · non-contrast
Comparison: None

INDICATION: Right sided knee pain. Please refer to formal consultation in the
[facility] EMR for additional details.
TECHNIQUE: Informed written consent was obtained from the patient after a
thorough discussion of the procedural risks, benefits and
alternatives. All questions were addressed. A timeout was performed
prior to the initiation of the procedure.

[Series 1: (id) · 20 acquisitions, 12 frames shown]
[im 1/20]
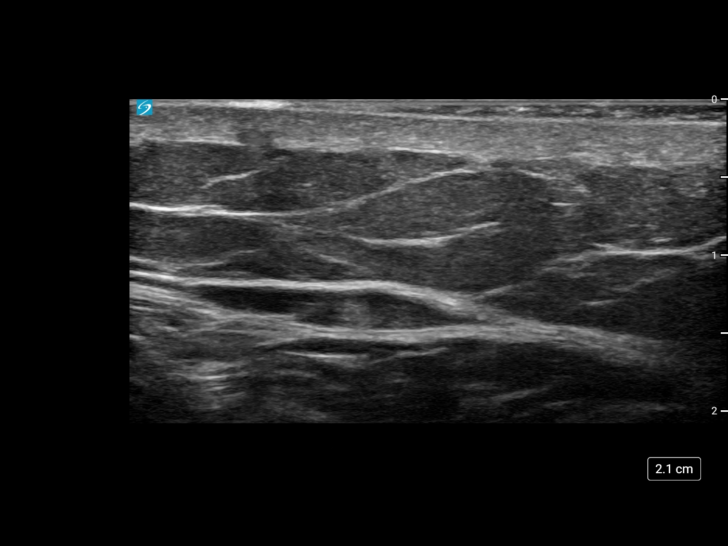
[im 3/20]
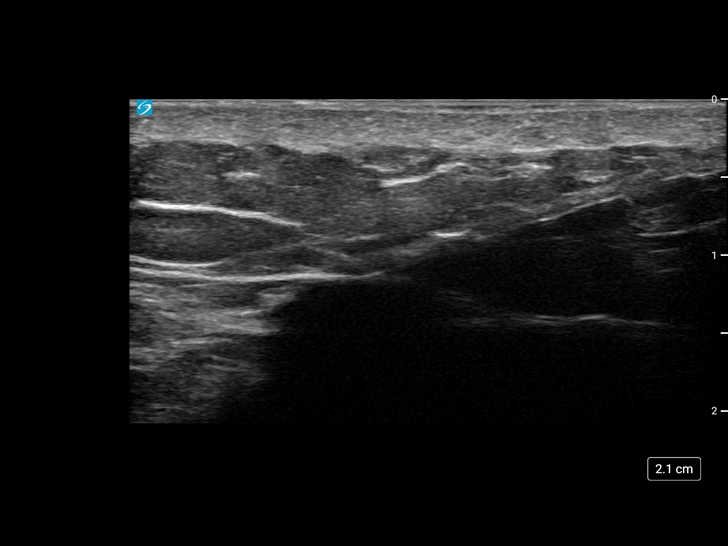
[im 5/20]
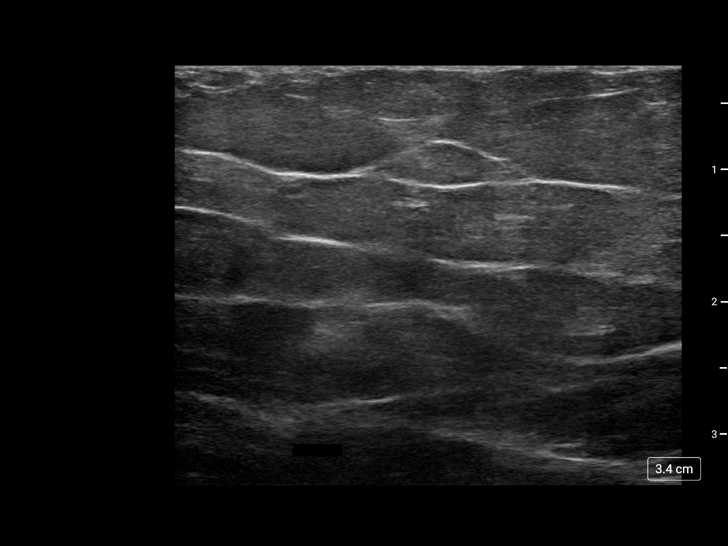
[im 6/20]
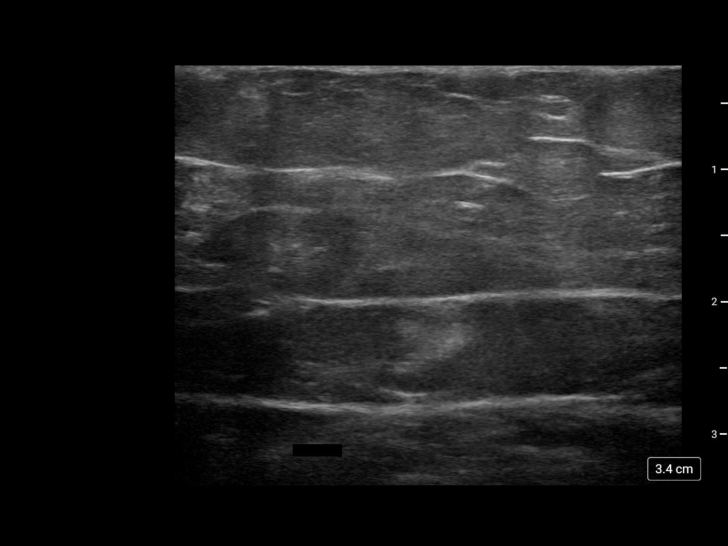
[im 8/20]
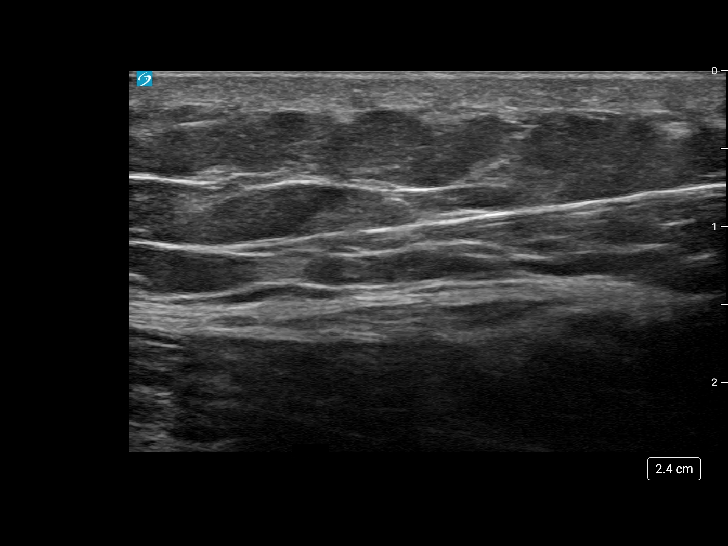
[im 10/20]
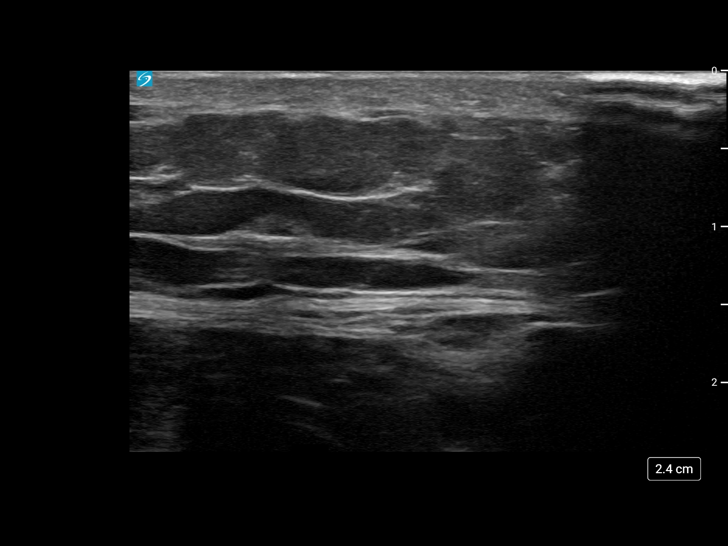
[im 11/20]
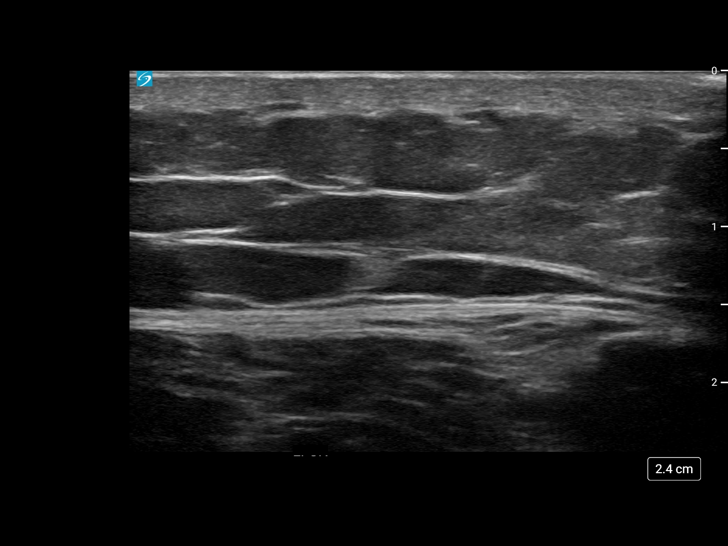
[im 13/20]
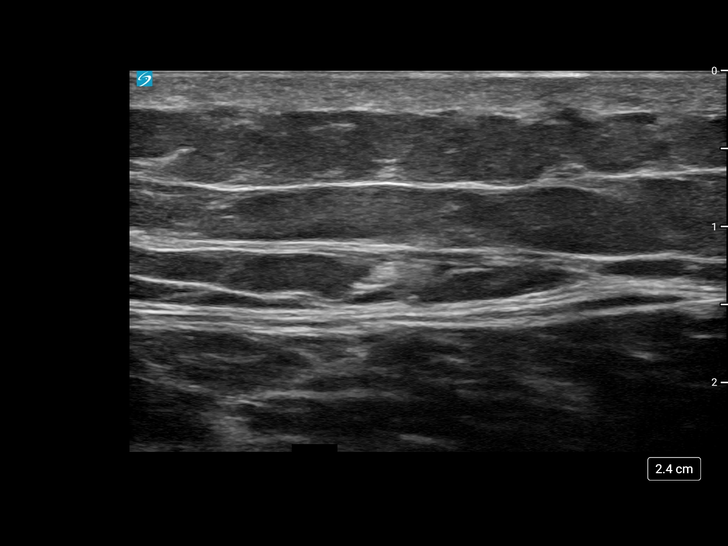
[im 15/20]
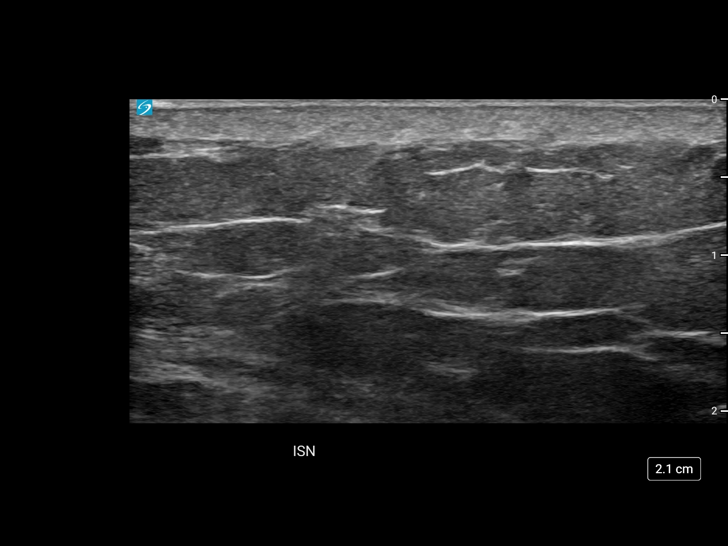
[im 16/20]
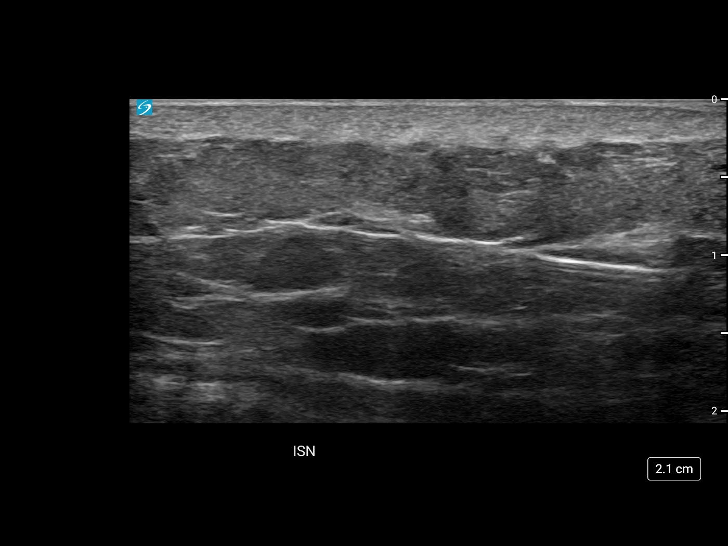
[im 18/20]
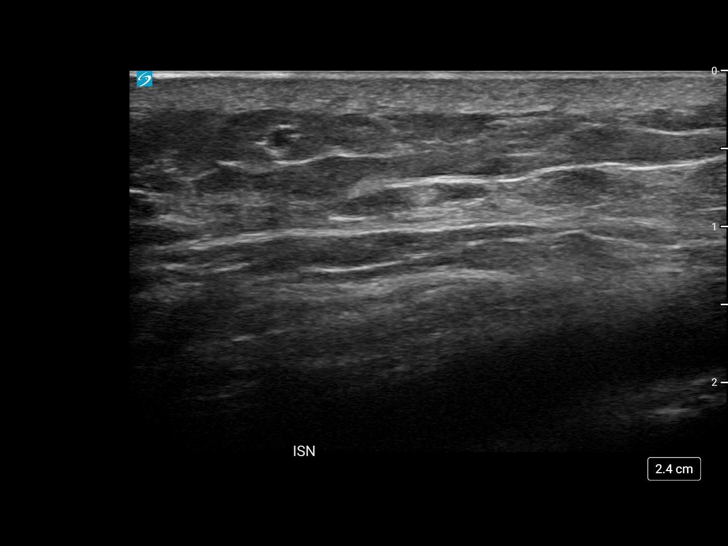
[im 20/20]
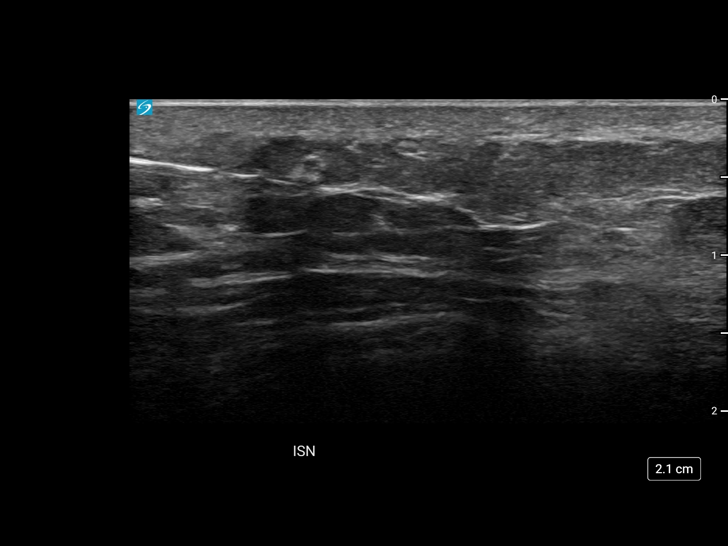

[12 of 20 positions shown; findings below may reference images not displayed]

EXAM:
1. ULTRASOUND-GUIDED CRYONEUROLYSIS (IOVERA) OF THE INTERMEDIATE
DIVISION OF THE ANTERIOR FEMORAL CUTANEOUS NERVE (IAFCN)
2. ULTRASOUND-GUIDED CRYONEUROLYSIS (IOVERA) OF THE MEDIAL DIVISION
OF THE ANTERIOR FEMORAL CUTANEOUS NERVE (MAFCN)
3. ULTRASOUND-GUIDED CRYONEUROLYSIS (IOVERA) OF THE LATERAL FEMORAL
CUTANEOUS NERVE (LFCN)
4. ULTRASOUND-GUIDED CRYONEUROLYSIS (IOVERA) OF THE INFRAPATELLAR
BRANCH OF THE SAPHENOUS NERVE (ISN)
MEDICATIONS:
None

ANESTHESIA/SEDATION:
None

FLUOROSCOPY TIME:  None

COMPLICATIONS:
None immediate.
The patient was positioned supine and sonographic evaluation of the
affected thigh and knee was performed by the dictating
interventional radiologist. Selected nerves were marked with a skin
pen and the procedure was planned.

Attention was first paid towards cryoablation of the intermediate
division of the anterior femoral cutaneous nerve (IAFCN).

The skin overlying the selected dermal access site was cleaned with
Betadine. Local anesthesia was provided with a combination of
topical anesthetic spray followed by subcutaneous administration 1%
lidocaine with epinephrine.

Next, under direct ultrasound guidance, the 20 gauge Smart Tip 190
IOVERA cryoneurolysis probe was advanced with tip ultimately
positioned immediately adjacent to the targeted nerve. Multiple
ultrasound images were saved for procedural documentation purposes.

The cryoneurolysis was then performed for 1 minute, 45 seconds for a
total of 1 cycle(s) with intermittent sonographer evaluation.

The identical procedure was repeated targeting the medial division
of the anterior femoral cutaneous nerve (MAFCN), the lateral femoral
cutaneous nerve (LFCN) and the infrapatellar branch of the saphenous
nerve (ISN). The cryoneurolysis was performed for each of the above
nerve for 1 minute, 45 seconds for a total of 1 cycle each for the
MAFCN and the ISN and for 1 cycles for the LFCN, all with
intermittent sonographer evaluation. Multiple ultrasound images were
saved for procedural documentation purposes.

Following completion of the procedure, sensation about the anterior
aspect of the knee was assessed and a postprocedural pain scale was
obtained.

Dressings were applied. The patient tolerated the procedure well
without immediate postprocedural complication.
FINDINGS: Sonographic guidance confirms appropriate positioning of the Smart
Tip 190 IOVERA cryoneurolysis probe immediately adjacent to the
targeted nerves with reduction in patient's postprocedural pain
scale as follows:

Pre procedural [facility] score: 53/96

Pre procedural knee pain:

At rest: - [DATE]

With activity: [DATE]

Post procedural knee pain:

At rest: - [DATE]

With activity: [DATE]
IMPRESSION: Technically successful cryoneurolysis (IOVERA) of the IAFCN, MAFCN,
LFCN and the ISN for right sided knee pain with reduction in
postprocedural pain scale.

## 2020-04-03 MED ORDER — LIDOCAINE-EPINEPHRINE (PF) 2 %-1:200000 IJ SOLN
INTRAMUSCULAR | Status: AC
  Filled 2020-04-03: qty 20

## 2020-04-03 MED ORDER — LIDOCAINE-EPINEPHRINE (PF) 2 %-1:200000 IJ SOLN
INTRAMUSCULAR | Status: DC | PRN
  Administered 2020-04-03: 10 mL

## 2020-04-03 NOTE — Procedures (Signed)
Pre procedural Dx: Right sided knee pain, post revision TKR Post procedural Dx: Same  Technically successful US guided cryo-neurolysis of the superficial cutaneous nerves of the anterior aspect of the right knee.  EBL: Trace Complications: None immediate  Katherina Right, MD Pager #: [phone]

## 2020-04-11 ENCOUNTER — Telehealth: Payer: Self-pay | Admitting: Interventional Radiology

## 2020-04-11 NOTE — Telephone Encounter (Signed)
Pt underwent technically success right sided superficial cryoneurolysis (IOVERA) of the right knee on 5/18.  Pre procedural pain: At rest: 3/10 With activity: 4/10  Post procedural pain: At rest: 2/10  With activity: 2/10  Unfortunately, the patient states that his lateral sided knee pain, at the location of the cryoneurolysis performed of the LFCN, remains tender to the touch, similar to the pain he experienced at the time of the ablation.    The patient states that his overall pain is similar in severity though in a different location than it was before he underwent the ablation.  I explained that this is likely the result of the ablation.  As I treated this area twice during the procedure, given his exaggerated response at the time of the procedure, I don't think trying to perform another ablation will significantly improve his symptoms and may make it worse. I explained that his symptoms may last for the next several weeks, as the nerve re-generates, and to continue conservative measures (ice, OTC anti-inflammatory medications) as indicated.   The patient demonstrates good understanding of the above conversation and is hopeful that ultimately he will get some benefit from undergoing the procedure.   Katherina Right, MD Pager #: [phone]

## 2020-10-17 ENCOUNTER — Other Ambulatory Visit: Payer: Self-pay | Admitting: Family Medicine

## 2020-10-17 DIAGNOSIS — S43402A Unspecified sprain of left shoulder joint, initial encounter: Secondary | ICD-10-CM

## 2020-11-05 ENCOUNTER — Ambulatory Visit
Admission: RE | Admit: 2020-11-05 | Discharge: 2020-11-05 | Disposition: A | Discharge status: Home / Self Care | Payer: 59 | Source: Ambulatory Visit | Attending: Family Medicine | Admitting: Family Medicine

## 2020-11-05 DIAGNOSIS — S43402A Unspecified sprain of left shoulder joint, initial encounter: Secondary | ICD-10-CM

## 2020-11-05 IMAGING — MR MR SHOULDER*L* W/O CM
5 series · 34 of 40 positions shown · non-contrast
Comparison: None.

CLINICAL DATA: Left shoulder pain and limited range of motion after
fall

EXAM:
MRI OF THE LEFT SHOULDER WITHOUT CONTRAST
TECHNIQUE: Multiplanar, multisequence MR imaging of the shoulder was performed.
No intravenous contrast was administered.

[Series 3: T2 fat-sat · axial · 4.0mm · 0.55mm/px · z∈[-67,+62]mm · 8 of 28 slices shown (1 of 3)]
[im 1/28]
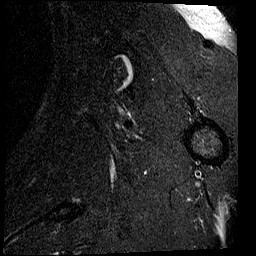
[im 4/28]
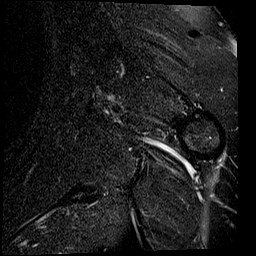
[im 10/28]
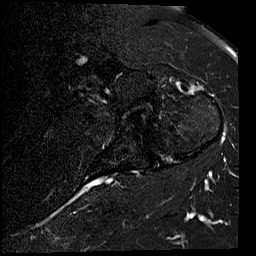
[im 13/28]
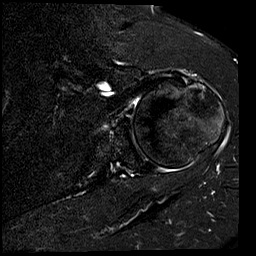
[im 16/28]
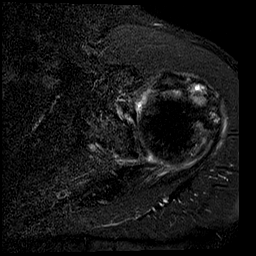
[im 19/28]
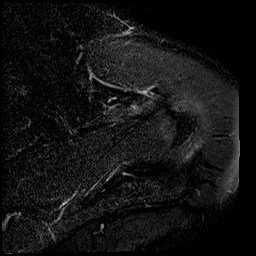
[im 25/28]
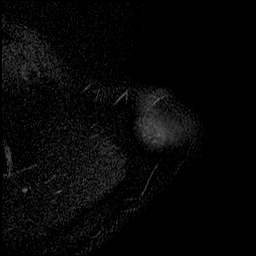
[im 28/28]
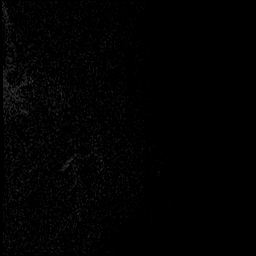

[Series 4: T1 · oblique · 4.0mm · 0.27mm/px · 4 of 23 slices shown]
[im 1/23]
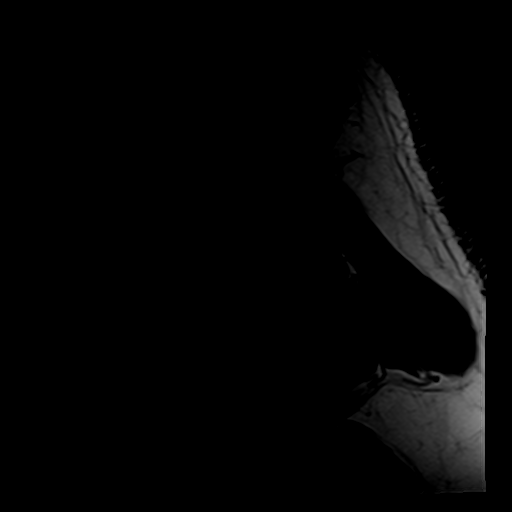
[im 4/23]
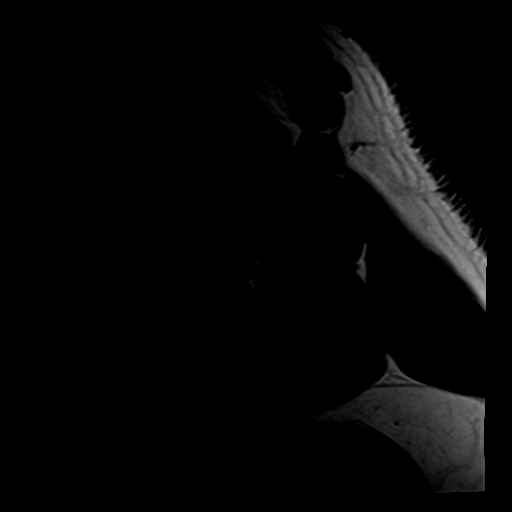
[im 7/23]
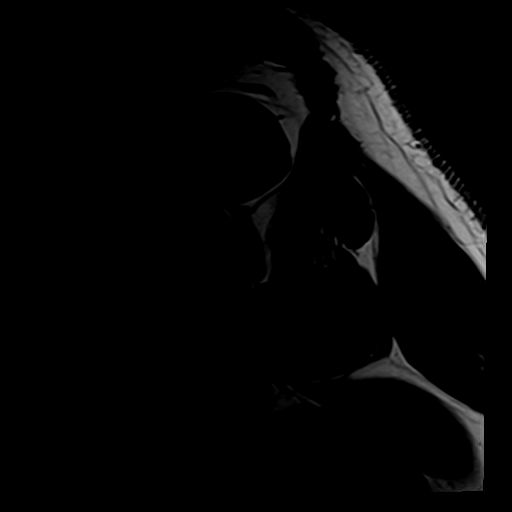
[im 10/23]
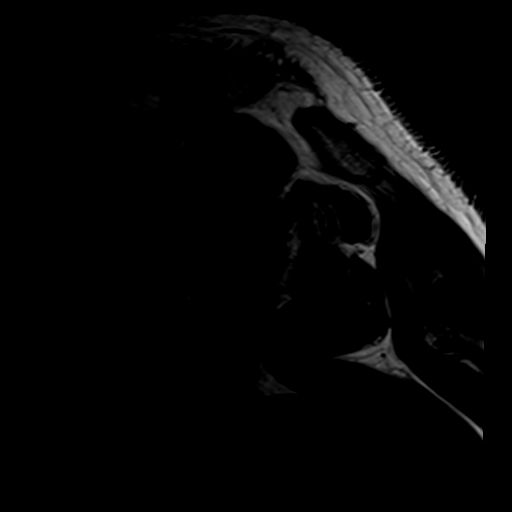

[Series 5: T2 fat-sat · oblique · 4.0mm · 0.55mm/px · 8 of 23 slices shown (2 of 3)]
[im 1/23]
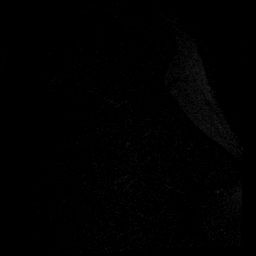
[im 4/23]
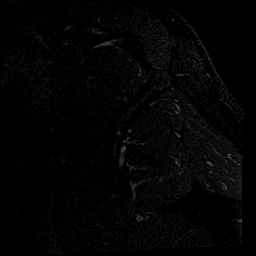
[im 7/23]
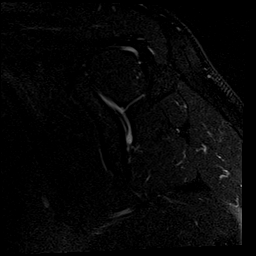
[im 10/23]
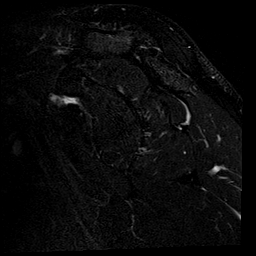
[im 13/23]
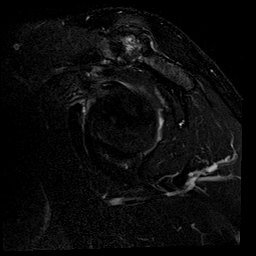
[im 16/23]
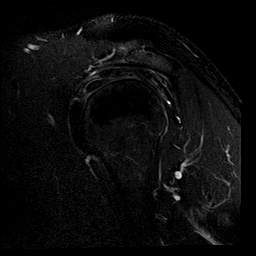
[im 19/23]
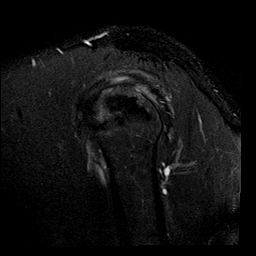
[im 23/23]
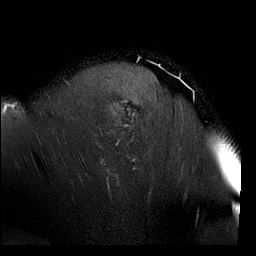

[Series 6: T2 fat-sat · oblique · 4.0mm · 0.62mm/px · 7 of 20 slices shown (3 of 3)]
[im 1/20]
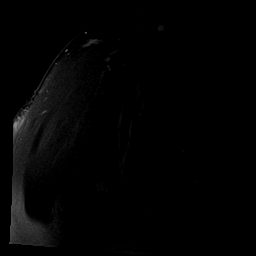
[im 4/20]
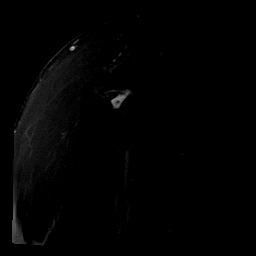
[im 7/20]
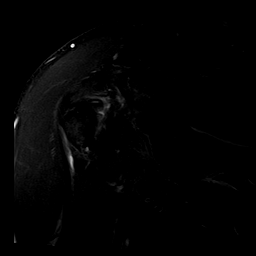
[im 10/20]
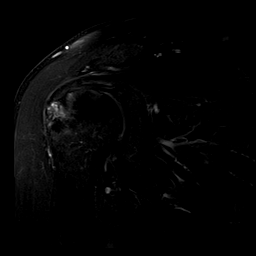
[im 13/20]
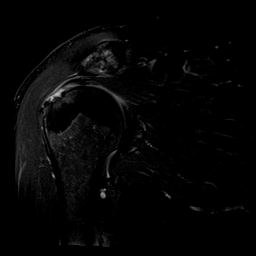
[im 16/20]
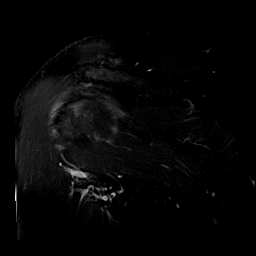
[im 20/20]
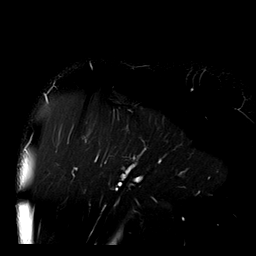

[Series 7: PD · oblique · 4.0mm · 0.27mm/px · 7 of 20 slices shown]
[im 1/20]
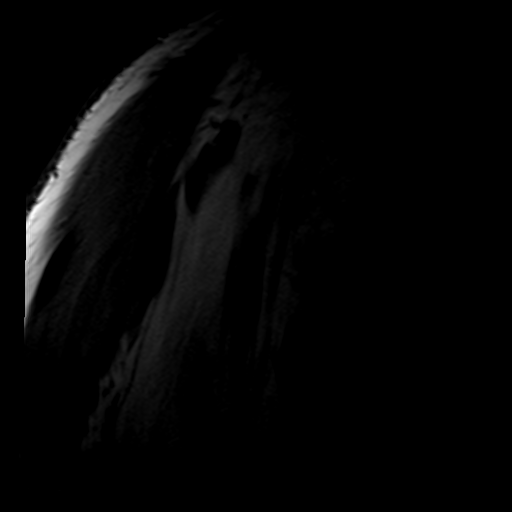
[im 4/20]
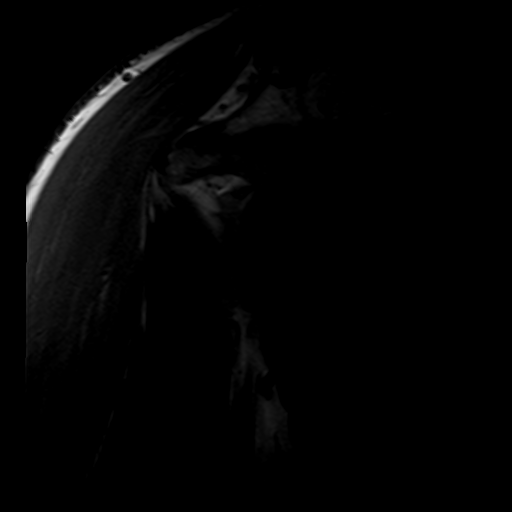
[im 7/20]
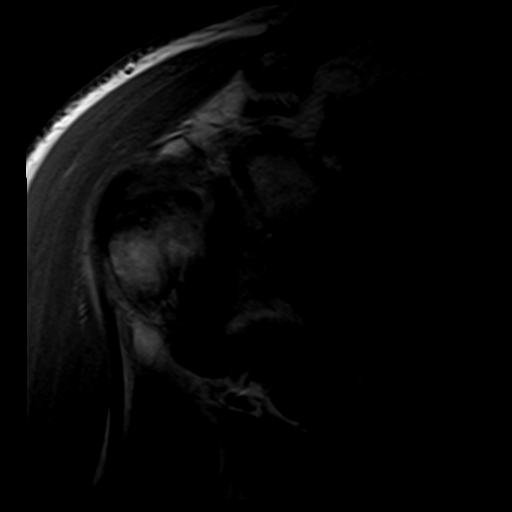
[im 10/20]
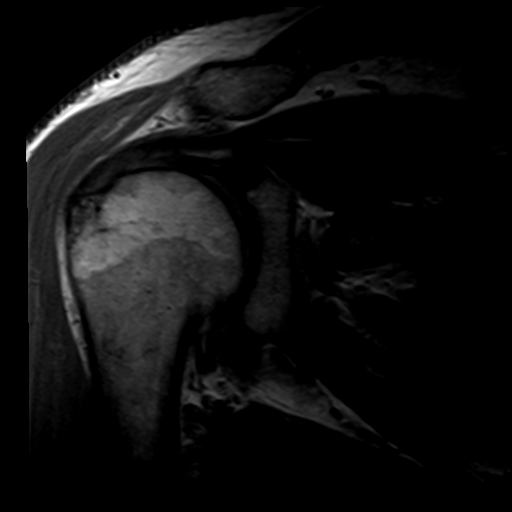
[im 13/20]
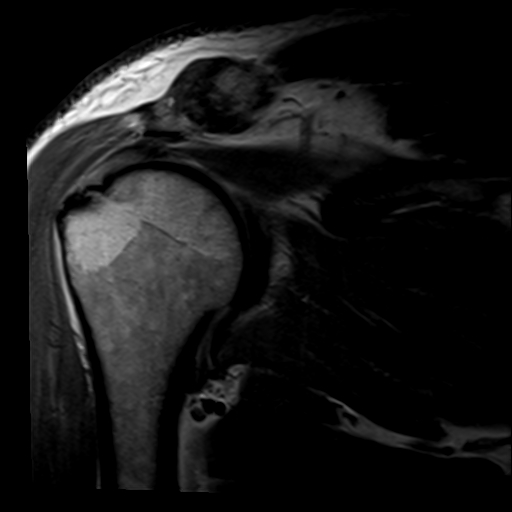
[im 16/20]
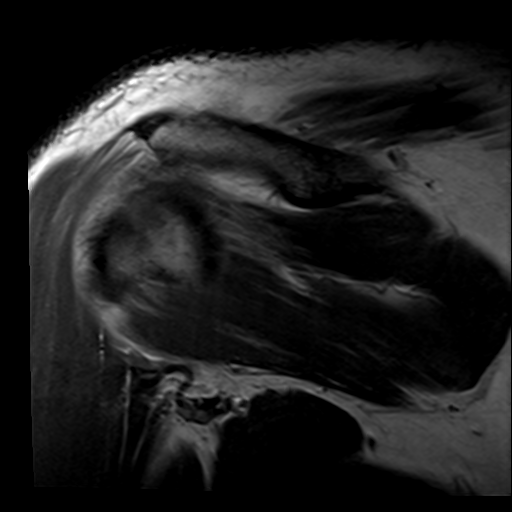
[im 20/20]
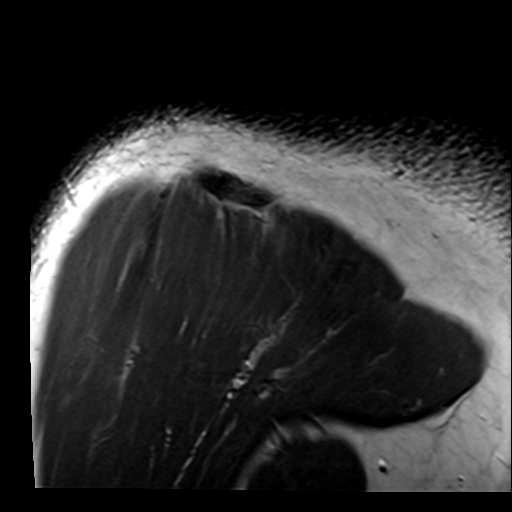

[34 of 40 positions shown; findings below may reference images not displayed]

FINDINGS: Rotator cuff: High-grade near full-thickness articular sided tear of
the distal rotator cuff involving the posterior supraspinatus and
anterior infraspinatus tendons measuring up to 15 mm in AP
dimension. Torn tendon fibers are retracted up to 9 mm. The
posterior margin of the tear may involve the full tendon depth. Mild
infraspinatus tendinosis with low-grade interstitial tear. Intact
teres minor tendon.

Muscles: Preserved bulk and signal intensity of the rotator cuff
musculature without edema, atrophy, or fatty infiltration.

Biceps long head: Intra-articular biceps tendinosis with
longitudinal split tear near the anchor site (series 5, images
13-14). Mild tenosynovitis.

Acromioclavicular Joint: Mild-moderate arthropathy of the AC joint.
Trace subacromial-subdeltoid bursal fluid.

Glenohumeral Joint: Chondral thinning and surface irregularity with
small near full-thickness defect along the inferior margin of the
humeral head (series 3, image 18). No glenohumeral joint effusion.

Labrum: Labral degeneration. Suspected tear of the posteroinferior
labrum (series 3, images 19-20). There is also somewhat globular
high signal within the posterosuperior labrum (series 6, image 10).
Evaluation of the labrum is limited in the absence of
intra-articular fluid/contrast. No paralabral cyst.

Bones: No acute fracture. No dislocation. Reactive subcortical
marrow signal changes at the greater tuberosity. No suspicious bone
lesion.

Other: None.
IMPRESSION: 1. High-grade near full-thickness articular sided tear of the distal
rotator cuff involving the posterior supraspinatus and anterior
infraspinatus tendons measuring up to 15 mm in AP dimension. Torn
tendon fibers are retracted up to 9 mm. The posterior margin of the
tear may involve the full tendon depth.
2. Mild infraspinatus tendinosis with low-grade interstitial tear.
3. Intra-articular biceps tendinosis with longitudinal split tear
near the anchor site.
4. Labral degeneration with probable tear of the posteroinferior
labrum.
5. Mild glenohumeral and mild-to-moderate AC joint osteoarthritis.

## 2022-12-16 ENCOUNTER — Other Ambulatory Visit: Payer: Self-pay | Admitting: Orthopedic Surgery

## 2022-12-16 DIAGNOSIS — M545 Low back pain, unspecified: Secondary | ICD-10-CM

## 2022-12-28 ENCOUNTER — Ambulatory Visit
Admission: RE | Admit: 2022-12-28 | Discharge: 2022-12-28 | Disposition: A | Discharge status: Home / Self Care | Payer: BLUE CROSS/BLUE SHIELD | Source: Ambulatory Visit | Attending: Orthopedic Surgery | Admitting: Orthopedic Surgery

## 2022-12-28 DIAGNOSIS — M545 Low back pain, unspecified: Secondary | ICD-10-CM

## 2023-01-05 ENCOUNTER — Other Ambulatory Visit: Payer: Self-pay | Admitting: Orthopedic Surgery

## 2023-01-05 DIAGNOSIS — M25512 Pain in left shoulder: Secondary | ICD-10-CM

## 2023-01-21 ENCOUNTER — Ambulatory Visit
Admission: RE | Admit: 2023-01-21 | Discharge: 2023-01-21 | Disposition: A | Discharge status: Home / Self Care | Payer: BLUE CROSS/BLUE SHIELD | Source: Ambulatory Visit | Attending: Orthopedic Surgery | Admitting: Orthopedic Surgery

## 2023-01-21 DIAGNOSIS — M25512 Pain in left shoulder: Secondary | ICD-10-CM

## 2023-05-11 ENCOUNTER — Other Ambulatory Visit: Payer: Self-pay | Admitting: Orthopedic Surgery

## 2023-06-04 NOTE — Patient Instructions (Signed)
SURGICAL WAITING ROOM VISITATION  Patients having surgery or a procedure may have no more than 2 support people in the waiting area - these visitors may rotate.    Children under the age of 80 must have an adult with them who is not the patient.  Due to an increase in RSV and influenza rates and associated hospitalizations, children ages 56 and under may not visit patients in [facility] hospitals.  If the patient needs to stay at the hospital during part of their recovery, the visitor guidelines for inpatient rooms apply. Pre-op nurse will coordinate an appropriate time for 1 support person to accompany patient in pre-op.  This support person may not rotate.    Please refer to the [facility] website for the visitor guidelines for Inpatients (after your surgery is over and you are in a regular room).    Your procedure is scheduled on: 06/15/23   Report to [facility] Main Entrance    Report to admitting at 7:10 AM   Call this number if you have problems the morning of surgery [phone]   Do not eat food :After Midnight.   After Midnight you may have the following liquids until 6:37 AM DAY OF SURGERY  Water Non-Citrus Juices (without pulp, NO RED-Apple, White grape, White cranberry) Black Coffee (NO MILK/CREAM OR CREAMERS, sugar ok)  Clear Tea (NO MILK/CREAM OR CREAMERS, sugar ok) regular and decaf                             Plain Jell-O (NO RED)                                           Fruit ices (not with fruit pulp, NO RED)                                     Popsicles (NO RED)                                                               Sports drinks like Gatorade (NO RED)                 The day of surgery:  Drink ONE (1) Pre-Surgery Clear Ensure at 6:37 AM the morning of surgery. Drink in one sitting. Do not sip.  This drink was given to you during your hospital  pre-op appointment visit. Nothing else to drink after completing the  Pre-Surgery Clear  Ensure.          If you have questions, please contact your surgeon's office.   FOLLOW BOWEL PREP AND ANY ADDITIONAL PRE OP INSTRUCTIONS YOU RECEIVED FROM YOUR SURGEON'S OFFICE!!!     Oral Hygiene is also important to reduce your risk of infection.                                    Remember - BRUSH YOUR TEETH THE MORNING OF SURGERY WITH YOUR REGULAR TOOTHPASTE  DENTURES WILL  BE REMOVED PRIOR TO SURGERY PLEASE DO NOT APPLY "Poly grip" OR ADHESIVES!!!   Do NOT smoke after Midnight   Take these medicines the morning of surgery with A SIP OF WATER: Tylenol, Amlodipine, Percocet   DO NOT TAKE ANY ORAL DIABETIC MEDICATIONS DAY OF YOUR SURGERY  Bring CPAP mask and tubing day of surgery.                              You may not have any metal on your body including jewelry, and body piercing             Do not wear lotions, powders, cologne, or deodorant              Men may shave face and neck.   Do not bring valuables to the hospital. [facility] IS NOT             RESPONSIBLE   FOR VALUABLES.   Contacts, glasses, dentures or bridgework may not be worn into surgery.  DO NOT BRING YOUR HOME MEDICATIONS TO THE HOSPITAL. PHARMACY WILL DISPENSE MEDICATIONS LISTED ON YOUR MEDICATION LIST TO YOU DURING YOUR ADMISSION IN THE HOSPITAL!    Patients discharged on the day of surgery will not be allowed to drive home.  Someone NEEDS to stay with you for the first 24 hours after anesthesia.   Special Instructions: Bring a copy of your healthcare power of attorney and living will documents the day of surgery if you haven't scanned them before.              Please read over the following fact sheets you were given: IF YOU HAVE QUESTIONS ABOUT YOUR PRE-OP INSTRUCTIONS PLEASE CALL [phone] [name]    If you received a COVID test during your pre-op visit  it is requested that you wear a mask when out in public, stay away from anyone that may not be feeling well and notify your surgeon if you  develop symptoms. If you test positive for Covid or have been in contact with anyone that has tested positive in the last 10 days please notify you surgeon.      Pre-operative 5 CHG Bath Instructions   You can play a key role in reducing the risk of infection after surgery. Your skin needs to be as free of germs as possible. You can reduce the number of germs on your skin by washing with CHG (chlorhexidine gluconate) soap before surgery. CHG is an antiseptic soap that kills germs and continues to kill germs even after washing.   DO NOT use if you have an allergy to chlorhexidine/CHG or antibacterial soaps. If your skin becomes reddened or irritated, stop using the CHG and notify one of our RNs at [phone].   Please shower with the CHG soap starting 4 days before surgery using the following schedule:     Please keep in mind the following:  DO NOT shave, including legs and underarms, starting the day of your first shower.   You may shave your face at any point before/day of surgery.  Place clean sheets on your bed the day you start using CHG soap. Use a clean washcloth (not used since being washed) for each shower. DO NOT sleep with pets once you start using the CHG.   CHG Shower Instructions:  If you choose to wash your hair and private area, wash first with your normal shampoo/soap.  After you use shampoo/soap,  rinse your hair and body thoroughly to remove shampoo/soap residue.  Turn the water OFF and apply about 3 tablespoons (45 ml) of CHG soap to a CLEAN washcloth.  Apply CHG soap ONLY FROM YOUR NECK DOWN TO YOUR TOES (washing for 3-5 minutes)  DO NOT use CHG soap on face, private areas, open wounds, or sores.  Pay special attention to the area where your surgery is being performed.  If you are having back surgery, having someone wash your back for you may be helpful. Wait 2 minutes after CHG soap is applied, then you may rinse off the CHG soap.  Pat dry with a clean towel  Put  on clean clothes/pajamas   If you choose to wear lotion, please use ONLY the CHG-compatible lotions on the back of this paper.     Additional instructions for the day of surgery: DO NOT APPLY any lotions, deodorants, cologne, or perfumes.   Put on clean/comfortable clothes.  Brush your teeth.  Ask your nurse before applying any prescription medications to the skin.      CHG Compatible Lotions   Aveeno Moisturizing lotion  Cetaphil Moisturizing Cream  Cetaphil Moisturizing Lotion  Clairol Herbal Essence Moisturizing Lotion, Dry Skin  Clairol Herbal Essence Moisturizing Lotion, Extra Dry Skin  Clairol Herbal Essence Moisturizing Lotion, Normal Skin  Curel Age Defying Therapeutic Moisturizing Lotion with Alpha Hydroxy  Curel Extreme Care Body Lotion  Curel Soothing Hands Moisturizing Hand Lotion  Curel Therapeutic Moisturizing Cream, Fragrance-Free  Curel Therapeutic Moisturizing Lotion, Fragrance-Free  Curel Therapeutic Moisturizing Lotion, Original Formula  Eucerin Daily Replenishing Lotion  Eucerin Dry Skin Therapy Plus Alpha Hydroxy Crme  Eucerin Dry Skin Therapy Plus Alpha Hydroxy Lotion  Eucerin Original Crme  Eucerin Original Lotion  Eucerin Plus Crme Eucerin Plus Lotion  Eucerin TriLipid Replenishing Lotion  Keri Anti-Bacterial Hand Lotion  Keri Deep Conditioning Original Lotion Dry Skin Formula Softly Scented  Keri Deep Conditioning Original Lotion, Fragrance Free Sensitive Skin Formula  Keri Lotion Fast Absorbing Fragrance Free Sensitive Skin Formula  Keri Lotion Fast Absorbing Softly Scented Dry Skin Formula  Keri Original Lotion  Keri Skin Renewal Lotion Keri Silky Smooth Lotion  Keri Silky Smooth Sensitive Skin Lotion  Nivea Body Creamy Conditioning Oil  Nivea Body Extra Enriched Teacher, adult education Moisturizing Lotion Nivea Crme  Nivea Skin Firming Lotion  NutraDerm 30 Skin Lotion  NutraDerm Skin Lotion  NutraDerm  Therapeutic Skin Cream  NutraDerm Therapeutic Skin Lotion  ProShield Protective Hand Cream  Provon moisturizing lotion  WHAT IS A BLOOD TRANSFUSION? Blood Transfusion Information  A transfusion is the replacement of blood or some of its parts. Blood is made up of multiple cells which provide different functions. Red blood cells carry oxygen and are used for blood loss replacement. White blood cells fight against infection. Platelets control bleeding. Plasma helps clot blood. Other blood products are available for specialized needs, such as hemophilia or other clotting disorders. BEFORE THE TRANSFUSION  Who gives blood for transfusions?  Healthy volunteers who are fully evaluated to make sure their blood is safe. This is blood bank blood. Transfusion therapy is the safest it has ever been in the practice of medicine. Before blood is taken from a donor, a complete history is taken to make sure that person has no history of diseases nor engages in risky social behavior (examples are intravenous drug use or sexual activity with multiple partners). The donor's travel history is screened to minimize risk  of transmitting infections, such as malaria. The donated blood is tested for signs of infectious diseases, such as HIV and hepatitis. The blood is then tested to be sure it is compatible with you in order to minimize the chance of a transfusion reaction. If you or a relative donates blood, this is often done in anticipation of surgery and is not appropriate for emergency situations. It takes many days to process the donated blood. RISKS AND COMPLICATIONS Although transfusion therapy is very safe and saves many lives, the main dangers of transfusion include:  Getting an infectious disease. Developing a transfusion reaction. This is an allergic reaction to something in the blood you were given. Every precaution is taken to prevent this. The decision to have a blood transfusion has been considered  carefully by your caregiver before blood is given. Blood is not given unless the benefits outweigh the risks. AFTER THE TRANSFUSION Right after receiving a blood transfusion, you will usually feel much better and more energetic. This is especially true if your red blood cells have gotten low (anemic). The transfusion raises the level of the red blood cells which carry oxygen, and this usually causes an energy increase. The nurse administering the transfusion will monitor you carefully for complications. HOME CARE INSTRUCTIONS  No special instructions are needed after a transfusion. You may find your energy is better. Speak with your caregiver about any limitations on activity for underlying diseases you may have. SEEK MEDICAL CARE IF:  Your condition is not improving after your transfusion. You develop redness or irritation at the intravenous (IV) site. SEEK IMMEDIATE MEDICAL CARE IF:  Any of the following symptoms occur over the next 12 hours: Shaking chills. You have a temperature by mouth above 102 F (38.9 C), not controlled by medicine. Chest, back, or muscle pain. People around you feel you are not acting correctly or are confused. Shortness of breath or difficulty breathing. Dizziness and fainting. You get a rash or develop hives. You have a decrease in urine output. Your urine turns a dark color or changes to pink, red, or brown. Any of the following symptoms occur over the next 10 days: You have a temperature by mouth above 102 F (38.9 C), not controlled by medicine. Shortness of breath. Weakness after normal activity. The white part of the eye turns yellow (jaundice). You have a decrease in the amount of urine or are urinating less often. Your urine turns a dark color or changes to pink, red, or brown. Document Released: 10/31/2000 Document Revised: 01/26/2012 Document Reviewed: 06/19/2008 Exit[employer] Patient Information 2014 [place],  [place].  _______________________________________________________________________  Incentive Spirometer  An incentive spirometer is a tool that can help keep your lungs clear and active. This tool measures how well you are filling your lungs with each breath. Taking long deep breaths may help reverse or decrease the chance of developing breathing (pulmonary) problems (especially infection) following: A long period of time when you are unable to move or be active. BEFORE THE PROCEDURE  If the spirometer includes an indicator to show your best effort, your nurse or respiratory therapist will set it to a desired goal. If possible, sit up straight or lean slightly forward. Try not to slouch. Hold the incentive spirometer in an upright position. INSTRUCTIONS FOR USE  Sit on the edge of your bed if possible, or sit up as far as you can in bed or on a chair. Hold the incentive spirometer in an upright position. Breathe out normally. Place the mouthpiece in  your mouth and seal your lips tightly around it. Breathe in slowly and as deeply as possible, raising the piston or the ball toward the top of the column. Hold your breath for 3-5 seconds or for as long as possible. Allow the piston or ball to fall to the bottom of the column. Remove the mouthpiece from your mouth and breathe out normally. Rest for a few seconds and repeat Steps 1 through 7 at least 10 times every 1-2 hours when you are awake. Take your time and take a few normal breaths between deep breaths. The spirometer may include an indicator to show your best effort. Use the indicator as a goal to work toward during each repetition. After each set of 10 deep breaths, practice coughing to be sure your lungs are clear. If you have an incision (the cut made at the time of surgery), support your incision when coughing by placing a pillow or rolled up towels firmly against it. Once you are able to get out of bed, walk around indoors and cough well.  You may stop using the incentive spirometer when instructed by your caregiver.  RISKS AND COMPLICATIONS Take your time so you do not get dizzy or light-headed. If you are in pain, you may need to take or ask for pain medication before doing incentive spirometry. It is harder to take a deep breath if you are having pain. AFTER USE Rest and breathe slowly and easily. It can be helpful to keep track of a log of your progress. Your caregiver can provide you with a simple table to help with this. If you are using the spirometer at home, follow these instructions: SEEK MEDICAL CARE IF:  You are having difficultly using the spirometer. You have trouble using the spirometer as often as instructed. Your pain medication is not giving enough relief while using the spirometer. You develop fever of 100.5 F (38.1 C) or higher. SEEK IMMEDIATE MEDICAL CARE IF:  You cough up bloody sputum that had not been present before. You develop fever of 102 F (38.9 C) or greater. You develop worsening pain at or near the incision site. MAKE SURE YOU:  Understand these instructions. Will watch your condition. Will get help right away if you are not doing well or get worse. Document Released: 03/16/2007 Document Revised: 01/26/2012 Document Reviewed: 05/17/2007 [facility] Patient Information 2014 [place], [place].   ________________________________________________________________________

## 2023-06-04 NOTE — Progress Notes (Signed)
COVID Vaccine Completed:  Date of COVID positive in last 90 days:  PCP - [facility] Cardiologist -   Chest x-ray -  EKG -  Stress Test -  ECHO -  Cardiac Cath -  Pacemaker/ICD device last checked: Spinal Cord Stimulator:  Bowel Prep -   Sleep Study -  CPAP -   Fasting Blood Sugar -  Checks Blood Sugar _____ times a day  Last dose of GLP1 agonist-  N/A GLP1 instructions:  N/A   Last dose of SGLT-2 inhibitors-  N/A SGLT-2 instructions: N/A   Blood Thinner Instructions:  Time Aspirin Instructions: ASA 325 Last Dose:  Activity level:  Can go up a flight of stairs and perform activities of daily living without stopping and without symptoms of chest pain or shortness of breath.  Able to exercise without symptoms  Unable to go up a flight of stairs without symptoms of     Anesthesia review:   Patient denies shortness of breath, fever, cough and chest pain at PAT appointment  Patient verbalized understanding of instructions that were given to them at the PAT appointment. Patient was also instructed that they will need to review over the PAT instructions again at home before surgery.

## 2023-06-05 ENCOUNTER — Encounter (HOSPITAL_COMMUNITY)
Admission: RE | Admit: 2023-06-05 | Discharge: 2023-06-05 | Disposition: A | Discharge status: Home / Self Care | Payer: BLUE CROSS/BLUE SHIELD | Source: Ambulatory Visit | Attending: Anesthesiology | Admitting: Anesthesiology

## 2023-06-05 DIAGNOSIS — I10 Essential (primary) hypertension: Secondary | ICD-10-CM

## 2023-06-08 NOTE — Progress Notes (Unsigned)
Referring Physician:  Shon Hale, MD [address] [place],  [place] [address]  Primary Physician:  [facility]  History of Present Illness: 06/08/2023 Reginald [name] is here today with a chief complaint of lower back pain with radiation into the right lateral thigh.  He has had this for over a year.  He states that he is always had some back and buttocks pain but over the past year has developed severe burning sensation in his lateral thigh.  This does not go past his knee.  He has not noticed any motor changes.  He does have a history of hip arthritis and is scheduled to undergo a right-sided hip replacement later this month.  He states that his pain on the lateral aspect of his thigh can reach a severity of 7 out of 10.  He has tried some nerve ablation/cryoablation's for knee pain in the past as well as IT band syndrome.  He has not had any previous trauma.  He does state that it gets worse with walking standing bending or twisting.  He has not noticed a significant way to improve it.  Feels it almost constantly.  He had previous lumbar epidural spinal injections which did not give him any significant improvement  Conservative measures:  Physical therapy:  Participated in for his hip, but not for his back Multimodal medical therapy including regular antiinflammatories:  hydrocodone  Injections:  epidural steroid injections 01/15/2023: Right L5-S1 ESI no relief   Past Surgery: denies   Reginald [name] has no symptoms of cervical myelopathy.  The symptoms are causing a significant impact on the patient's life.   I have utilized the care everywhere function in epic to review the outside records available from external health systems.  Review of Systems:  A 10 point review of systems is negative, except for the pertinent positives and negatives detailed in the HPI.  Past Medical History: Past Medical History:  Diagnosis Date   Arthritis     Hypertension     Past Surgical History: Past Surgical History:  Procedure Laterality Date   CHOLECYSTECTOMY  12/03/2011   Procedure: LAPAROSCOPIC CHOLECYSTECTOMY WITH INTRAOPERATIVE CHOLANGIOGRAM;  Surgeon: Atilano Ina, MD;  Location: [facility];  Service: General;  Laterality: N/A;  laparoscopic cholecystectomy with intraoperative cholangiogram   CHONDROPLASTY Right 09/19/2015   Procedure: CHONDROPLASTY;  Surgeon: Jodi Geralds, MD;  Location: [facility];  Service: Orthopedics;  Laterality: Right;   ENDOVENOUS ABLATION SAPHENOUS VEIN W/ LASER Right 01/13/2019   endovenous laser ablation right greater saphenous vein and stab phlebectomy 10-20 incisions right leg by Waverly Ferrari MD    IR ABLATE LIVER CRYOABLATION  04/03/2020   IR RADIOLOGIST EVAL & MGMT  03/28/2020   KNEE ARTHROSCOPY WITH LATERAL MENISECTOMY Right 09/19/2015   Procedure: KNEE ARTHROSCOPY WITH PARTIAL LATERAL MENISECTOMY;  Surgeon: Jodi Geralds, MD;  Location: [facility];  Service: Orthopedics;  Laterality: Right;   KNEE ARTHROSCOPY WITH MEDIAL MENISECTOMY Right 09/19/2015   Procedure: KNEE ARTHROSCOPY WITH PARTIAL MEDIAL MENISECTOMY;  Surgeon: Jodi Geralds, MD;  Location:  SURGERY CENTER;  Service: Orthopedics;  Laterality: Right;   spider bite     black widow or brown recluse   TOE FUSION Left    TOTAL KNEE ARTHROPLASTY Right 03/31/2018   Procedure: RIGHT TOTAL KNEE ARTHROPLASTY;  Surgeon: Jodi Geralds, MD;  Location: [facility];  Service: Orthopedics;  Laterality: Right;   TOTAL KNEE REVISION Right 11/04/2019   Procedure:  RIGHT TOTAL KNEE REVISION;  Surgeon: Jodi Geralds, MD;  Location: [facility];  Service: Orthopedics;  Laterality: Right;    Allergies: Allergies as of 06/10/2023 - Review Complete 06/08/2023  Allergen Reaction Noted   Gabapentin  10/28/2019   Ibuprofen Nausea And Vomiting 12/02/2011    Medications:  Current Outpatient Medications:    folic acid (FOLVITE) 1 MG tablet,  Take 1 mg by mouth daily., Disp: , Rfl:    HYDROcodone-acetaminophen (NORCO) 10-325 MG tablet, Take 1 tablet by mouth 3 (three) times daily., Disp: , Rfl:    losartan (COZAAR) 100 MG tablet, Take 100 mg by mouth daily., Disp: , Rfl:    Menthol, Topical Analgesic, (BIOFREEZE EX), Apply 1 Application topically daily as needed (pain)., Disp: , Rfl:    methotrexate (RHEUMATREX) 2.5 MG tablet, Take 15 mg by mouth every Monday., Disp: , Rfl:    VITAMIN D PO, Take 1 capsule by mouth daily., Disp: , Rfl:   Social History: Social History   Tobacco Use   Smoking status: Never   Smokeless tobacco: Never  Vaping Use   Vaping status: Never Used  Substance Use Topics   Alcohol use: Yes    Alcohol/week: 1.0 standard drink of alcohol    Types: 1 Cans of beer per week    Comment: occa.   Drug use: No    Family Medical History: Family History  Problem Relation Age of Onset   Cancer Mother        breast   Heart disease Mother     Physical Examination: There were no vitals filed for this visit.  General: Patient is in no apparent distress. Attention to examination is appropriate.  Neck:   Supple.  Full range of motion.  Respiratory: Patient is breathing without any difficulty.   NEUROLOGICAL:     Awake, alert, oriented to person, place, and time.  Speech is clear and fluent.   Cranial Nerves: Pupils equal round and reactive to light.  Facial tone is symmetric.  Facial sensation is symmetric. Shoulder shrug is symmetric. Tongue protrusion is midline.    Strength:  Side Iliopsoas Quads Hamstring PF DF EHL  R 5 5 5 5 5 5   L 5 5 5 5 5 5    Reflexes are 1+ and symmetric at the patella and achilles.   Hoffman's is absent. Clonus is absent  Bilateral upper and lower extremity sensation is intact to light touch, he does have some paresthesias in the right lateral femoral cutaneous nerve distribution  Severe Tinel's sign noted at the right ASIS reproducing pain in the LF CN distribution.      No evidence of dysmetria noted.  Gait is antalgic  Imaging: Narrative & Impression  CLINICAL DATA:  Initial evaluation for lower back pain with radiation into the legs bilaterally.   EXAM: MRI LUMBAR SPINE WITHOUT CONTRAST   TECHNIQUE: Multiplanar, multisequence MR imaging of the lumbar spine was performed. No intravenous contrast was administered.   COMPARISON:  None available.   FINDINGS: Segmentation: Standard. Lowest well-formed disc space labeled the L5-S1 level.   Alignment: Trace degenerative retrolisthesis of L5 on S1. Alignment otherwise normal preservation of the normal lumbar lordosis.   Vertebrae: Vertebral body height maintained without acute or chronic fracture. Bone marrow signal intensity within normal limits. No discrete or worrisome osseous lesions. Mild reactive edema present about the L4-5 and L5-S1 facets due to facet arthritis. No other abnormal marrow edema.   Conus medullaris and cauda equina: Conus extends to the L1-2  level. Conus and cauda equina appear normal.   Paraspinal and other soft tissues: Paraspinous soft tissues within normal limits. Few scattered simple cyst noted about the visualized kidneys, benign in appearance, no follow-up imaging recommended.   Disc levels:   T11-12: Seen only on sagittal projection. Disc desiccation with prominent anterior endplate spurring, but no significant disc bulge. No stenosis.   T12-L1: Unremarkable.   L1-2:  Unremarkable.   L2-3:  Unremarkable.   L3-4: Disc desiccation with mild annular disc bulge. Minimal facet spurring with associated trace joint effusions. No significant spinal stenosis. Foramina remain patent.   L4-5: Disc desiccation with mild disc bulge. Moderate bilateral facet hypertrophy with associated mild reactive edema. Mild epidural lipomatosis. No significant spinal stenosis. Mild bilateral L4 foraminal narrowing.   L5-S1: Degenerative intervertebral disc space narrowing  with disc desiccation and diffuse disc bulge. Associated mild reactive endplate spurring. Moderate right worse than left facet arthrosis. Probable small synovial cyst at the posterior margin of the left L5-S1 facet measuring up to 7 mm noted (series 7, image 34). Mild epidural lipomatosis. No significant spinal stenosis. Mild to moderate bilateral L5 foraminal narrowing.   IMPRESSION: 1. Degenerative disc bulge with facet hypertrophy at L4-5 and L5-S1 with resultant mild to moderate bilateral L4 and L5 foraminal stenosis as above. 2. Mild reactive edema about the L4-5 and L5-S1 facets due to facet arthritis. Finding could serve as a source for lower back pain. 3. Mild for age degenerative disc disease at L3-4 without stenosis or impingement.     Electronically Signed   By: Rise Mu M.D.   On: 12/29/2022 04:25    I have personally reviewed the images and agree with the above interpretation.  Medical Decision Making/Assessment and Plan: Spondylosis without myelopathy or radiculopathy, lumbar region Meralgia paraesthetica, right Obesity (BMI 30-39.9)  Reginald [name] is a pleasant 57 y.o. male with history of back pain.  Over the past 1+ year, has developed significant burning sensation in the lateral aspect of the thigh and not passing the knee.  He has not had any other neurologic symptoms including weakness tingling or numbness in any other distribution.  On physical examination he has full strength with a intact Achilles and patellar reflex.  He does have a significant Tinel sign at the ASIS that causes radiation into the LFCN distribution.  Notably he does have superimposed hip issues for which he is getting hip replacement next week.  We like to follow up with him for his presumed LFCN nerve pain.  We like to start with a injection once he is cleared from orthopedic surgery and we will go from there.  In regards to his back pain we did discuss possibility of physical therapy  as well as weight management.  He does have notable spondylosis however most of this is in the L4-S1 region where is his nerve pain would be more attributable to the L2-L3 regions.  Since there is no stenosis at the L2-L3 locations we feel that he likely has a peripheral process.  Thank you for involving me in the care of this patient.   Lovenia Kim MD/MSCR Neurosurgery

## 2023-06-10 ENCOUNTER — Encounter: Payer: Self-pay | Admitting: Neurosurgery

## 2023-06-10 ENCOUNTER — Ambulatory Visit: Payer: BLUE CROSS/BLUE SHIELD | Admitting: Neurosurgery

## 2023-06-10 VITALS — BP 130/78 | Ht 70.0 in | Wt 280.2 lb

## 2023-06-10 DIAGNOSIS — Z6841 Body Mass Index (BMI) 40.0 and over, adult: Secondary | ICD-10-CM

## 2023-06-10 DIAGNOSIS — E669 Obesity, unspecified: Secondary | ICD-10-CM

## 2023-06-10 DIAGNOSIS — M47816 Spondylosis without myelopathy or radiculopathy, lumbar region: Secondary | ICD-10-CM

## 2023-06-10 DIAGNOSIS — G5711 Meralgia paresthetica, right lower limb: Secondary | ICD-10-CM | POA: Diagnosis not present

## 2023-06-12 ENCOUNTER — Encounter (HOSPITAL_COMMUNITY): Payer: Self-pay

## 2023-06-12 ENCOUNTER — Encounter (HOSPITAL_COMMUNITY)
Admission: RE | Admit: 2023-06-12 | Discharge: 2023-06-12 | Disposition: A | Discharge status: Home / Self Care | Payer: BLUE CROSS/BLUE SHIELD | Source: Ambulatory Visit | Attending: Orthopedic Surgery | Admitting: Orthopedic Surgery

## 2023-06-12 ENCOUNTER — Ambulatory Visit (HOSPITAL_COMMUNITY)
Admission: RE | Admit: 2023-06-12 | Discharge: 2023-06-12 | Disposition: A | Discharge status: Home / Self Care | Payer: BLUE CROSS/BLUE SHIELD | Source: Ambulatory Visit | Attending: Orthopedic Surgery | Admitting: Orthopedic Surgery

## 2023-06-12 ENCOUNTER — Other Ambulatory Visit: Payer: Self-pay

## 2023-06-12 VITALS — BP 144/82 | HR 56 | Temp 98.6°F | Resp 16 | Ht 70.0 in | Wt 274.0 lb

## 2023-06-12 DIAGNOSIS — M1611 Unilateral primary osteoarthritis, right hip: Secondary | ICD-10-CM | POA: Diagnosis not present

## 2023-06-12 DIAGNOSIS — I10 Essential (primary) hypertension: Secondary | ICD-10-CM

## 2023-06-12 DIAGNOSIS — Z01818 Encounter for other preprocedural examination: Secondary | ICD-10-CM | POA: Insufficient documentation

## 2023-06-12 DIAGNOSIS — Z79899 Other long term (current) drug therapy: Secondary | ICD-10-CM | POA: Diagnosis not present

## 2023-06-12 DIAGNOSIS — Z6839 Body mass index (BMI) 39.0-39.9, adult: Secondary | ICD-10-CM | POA: Diagnosis not present

## 2023-06-12 DIAGNOSIS — Z8249 Family history of ischemic heart disease and other diseases of the circulatory system: Secondary | ICD-10-CM | POA: Diagnosis not present

## 2023-06-12 HISTORY — DX: Calculus of gallbladder without cholecystitis without obstruction: K80.20

## 2023-06-12 LAB — BASIC METABOLIC PANEL
Anion gap: 9 (ref 5–15)
BUN: 9 mg/dL (ref 6–20)
CO2: 23 mmol/L (ref 22–32)
Calcium: 8.7 mg/dL — ABNORMAL LOW (ref 8.9–10.3)
Chloride: 107 mmol/L (ref 98–111)
Creatinine, Ser: 1.05 mg/dL (ref 0.61–1.24)
GFR, Estimated: >60 mL/min (ref >60)
Glucose, Bld: 130 mg/dL — ABNORMAL HIGH (ref 70–99)
Potassium: 3.7 mmol/L (ref 3.5–5.1)
Sodium: 139 mmol/L (ref 135–145)

## 2023-06-12 LAB — SURGICAL PCR SCREEN
MRSA, PCR: NEGATIVE
Staphylococcus aureus: NEGATIVE

## 2023-06-12 LAB — CBC
HCT: 41.1 % (ref 39.0–52.0)
Hemoglobin: 13.9 g/dL (ref 13.0–17.0)
MCH: 31.5 pg (ref 26.0–34.0)
MCHC: 33.8 g/dL (ref 30.0–36.0)
MCV: 93.2 fL (ref 80.0–100.0)
Platelets: 242 10*3/uL (ref 150–400)
RBC: 4.41 MIL/uL (ref 4.22–5.81)
RDW: 12.6 % (ref 11.5–15.5)
WBC: 7.8 10*3/uL (ref 4.0–10.5)
nRBC: 0 % (ref 0.0–0.2)

## 2023-06-12 LAB — TYPE AND SCREEN
ABO/RH(D): O NEG
Antibody Screen: NEGATIVE

## 2023-06-12 NOTE — Anesthesia Preprocedure Evaluation (Signed)
Anesthesia Evaluation  Patient identified by MRN, date of birth, ID band Patient awake    Reviewed: Allergy & Precautions, H&P , NPO status , Patient's Chart, lab work & pertinent test results  Airway Mallampati: III  TM Distance: >3 FB Neck ROM: Full    Dental no notable dental hx. (+) Teeth Intact, Dental Advisory Given   Pulmonary neg pulmonary ROS   Pulmonary exam normal breath sounds clear to auscultation       Cardiovascular Exercise Tolerance: Good hypertension, Pt. on medications  Rhythm:Regular Rate:Normal     Neuro/Psych negative neurological ROS  negative psych ROS   GI/Hepatic negative GI ROS, Neg liver ROS,,,  Endo/Other    Morbid obesity  Renal/GU negative Renal ROS  negative genitourinary   Musculoskeletal  (+) Arthritis , Osteoarthritis,    Abdominal   Peds  Hematology negative hematology ROS (+)   Anesthesia Other Findings   Reproductive/Obstetrics negative OB ROS                             Anesthesia Physical Anesthesia Plan  ASA: 3  Anesthesia Plan: Spinal   Post-op Pain Management: Tylenol PO (pre-op)*   Induction: Intravenous  PONV Risk Score and Plan: 2 and Propofol infusion, Ondansetron and Dexamethasone  Airway Management Planned: Natural Airway and Simple Face Mask  Additional Equipment:   Intra-op Plan:   Post-operative Plan:   Informed Consent: I have reviewed the patients History and Physical, chart, labs and discussed the procedure including the risks, benefits and alternatives for the proposed anesthesia with the patient or authorized representative who has indicated his/her understanding and acceptance.     Dental advisory given  Plan Discussed with: CRNA  Anesthesia Plan Comments:        Anesthesia Quick Evaluation

## 2023-06-12 NOTE — Progress Notes (Addendum)
COVID Vaccine Completed:yes  Date of COVID positive in last 90 days:  PCP - [facility]  Darrick Meigs Cardiologist -   Chest x-ray -  EKG - 06-12-23 Stress Test -  ECHO -  Cardiac Cath -  Pacemaker/ICD device last checked: Spinal Cord Stimulator:  Bowel Prep -   Sleep Study -  CPAP -   Fasting Blood Sugar -  Checks Blood Sugar _____ times a day  Last dose of GLP1 agonist-  N/A GLP1 instructions:  N/A   Last dose of SGLT-2 inhibitors-  N/A SGLT-2 instructions: N/A  Methotrexate last dose 06-08-23 Blood Thinner Instructions:  Time Aspirin Instructions: ASA 325 Last Dose:stop 7 days   Activity level:Can go up a flight of stairs and perform activities of daily living without stopping and without symptoms of chest pain or shortness of breath.  Able to exercise without symptoms     Anesthesia review: HTN  Patient denies shortness of breath, fever, cough and chest pain at PAT appointment  Patient verbalized understanding of instructions that were given to them at the PAT appointment. Patient was also instructed that they will need to review over the PAT instructions again at home before surgery.

## 2023-06-12 NOTE — Patient Instructions (Signed)
SURGICAL WAITING ROOM VISITATION  Patients having surgery or a procedure may have no more than 2 support people in the waiting area - these visitors may rotate.    Children under the age of 47 must have an adult with them who is not the patient.   If the patient needs to stay at the hospital during part of their recovery, the visitor guidelines for inpatient rooms apply. Pre-op nurse will coordinate an appropriate time for 1 support person to accompany patient in pre-op.  This support person may not rotate.    Please refer to the [facility] website for the visitor guidelines for Inpatients (after your surgery is over and you are in a regular room).    Your procedure is scheduled on: 06/15/23   Report to [facility] Main Entrance    Report to admitting at 7:10 AM   Call this number if you have problems the morning of surgery [phone]   Do not eat food :After Midnight.   After Midnight you may have the following liquids until 6:30 AM DAY OF SURGERY  Water Non-Citrus Juices (without pulp, NO RED-Apple, White grape, White cranberry) Black Coffee (NO MILK/CREAM OR CREAMERS, sugar ok)  Clear Tea (NO MILK/CREAM OR CREAMERS, sugar ok) regular and decaf                             Plain Jell-O (NO RED)                                           Fruit ices (not with fruit pulp, NO RED)                                     Popsicles (NO RED)                                                               Sports drinks like Gatorade (NO RED)                 The day of surgery:  COMPLETE ONE (1) Pre-Surgery Clear Ensure by 6:30 AM the morning of surgery. Drink in one sitting. Do not sip.  This drink was given to you during your hospital  pre-op appointment visit. Nothing else to drink after completing the  Pre-Surgery Clear Ensure.          If you have questions, please contact your surgeon's office.   FOLLOW ANY ADDITIONAL PRE OP INSTRUCTIONS YOU RECEIVED FROM YOUR SURGEON'S  OFFICE!!!     Oral Hygiene is also important to reduce your risk of infection.                                    Remember - BRUSH YOUR TEETH THE MORNING OF SURGERY WITH YOUR REGULAR TOOTHPASTE  DENTURES WILL BE REMOVED PRIOR TO SURGERY PLEASE DO NOT APPLY "Poly grip" OR ADHESIVES!!!   Do NOT smoke after Midnight   Take these medicines the morning of  surgery with A SIP OF WATER: hydrocodone if needed  DO NOT TAKE ANY ORAL DIABETIC MEDICATIONS DAY OF YOUR SURGERY  Bring CPAP mask and tubing day of surgery.                              You may not have any metal on your body including jewelry, and body piercing             Do not wear lotions, powders, cologne, or deodorant              Men may shave face and neck.   Do not bring valuables to the hospital. [facility] IS NOT             RESPONSIBLE   FOR VALUABLES.   Contacts, glasses, dentures or bridgework may not be worn into surgery.  DO NOT BRING YOUR HOME MEDICATIONS TO THE HOSPITAL. PHARMACY WILL DISPENSE MEDICATIONS LISTED ON YOUR MEDICATION LIST TO YOU DURING YOUR ADMISSION IN THE HOSPITAL!    Patients discharged on the day of surgery will not be allowed to drive home.  Someone NEEDS to stay with you for the first 24 hours after anesthesia.   Special Instructions: Bring a copy of your healthcare power of attorney and living will documents the day of surgery if you haven't scanned them before.              Please read over the following fact sheets you were given: IF YOU HAVE QUESTIONS ABOUT YOUR PRE-OP INSTRUCTIONS PLEASE CALL [phone] Contras     If you test positive for Covid or have been in contact with anyone that has tested positive in the last 10 days please notify you surgeon.      Pre-operative 5 CHG Bath Instructions   You can play a key role in reducing the risk of infection after surgery. Your skin needs to be as free of germs as possible. You can reduce the number of germs on your skin by washing with  CHG (chlorhexidine gluconate) soap before surgery. CHG is an antiseptic soap that kills germs and continues to kill germs even after washing.   DO NOT use if you have an allergy to chlorhexidine/CHG or antibacterial soaps. If your skin becomes reddened or irritated, stop using the CHG and notify one of our RNs at [phone].   Please shower with the CHG soap starting 4 days before surgery using the following schedule:     Please keep in mind the following:  DO NOT shave, including legs and underarms, starting the day of your first shower.   You may shave your face at any point before/day of surgery.  Place clean sheets on your bed the day you start using CHG soap. Use a clean washcloth (not used since being washed) for each shower. DO NOT sleep with pets once you start using the CHG.   CHG Shower Instructions:  If you choose to wash your hair and private area, wash first with your normal shampoo/soap.  After you use shampoo/soap, rinse your hair and body thoroughly to remove shampoo/soap residue.  Turn the water OFF and apply about 3 tablespoons (45 ml) of CHG soap to a CLEAN washcloth.  Apply CHG soap ONLY FROM YOUR NECK DOWN TO YOUR TOES (washing for 3-5 minutes)  DO NOT use CHG soap on face, private areas, open wounds, or sores.  Pay special attention to the area where your surgery  is being performed.  If you are having back surgery, having someone wash your back for you may be helpful. Wait 2 minutes after CHG soap is applied, then you may rinse off the CHG soap.  Pat dry with a clean towel  Put on clean clothes/pajamas   If you choose to wear lotion, please use ONLY the CHG-compatible lotions on the back of this paper.     Additional instructions for the day of surgery: DO NOT APPLY any lotions, deodorants, cologne, or perfumes.   Put on clean/comfortable clothes.  Brush your teeth.  Ask your nurse before applying any prescription medications to the skin.      CHG  Compatible Lotions   Aveeno Moisturizing lotion  Cetaphil Moisturizing Cream  Cetaphil Moisturizing Lotion  Clairol Herbal Essence Moisturizing Lotion, Dry Skin  Clairol Herbal Essence Moisturizing Lotion, Extra Dry Skin  Clairol Herbal Essence Moisturizing Lotion, Normal Skin  Curel Age Defying Therapeutic Moisturizing Lotion with Alpha Hydroxy  Curel Extreme Care Body Lotion  Curel Soothing Hands Moisturizing Hand Lotion  Curel Therapeutic Moisturizing Cream, Fragrance-Free  Curel Therapeutic Moisturizing Lotion, Fragrance-Free  Curel Therapeutic Moisturizing Lotion, Original Formula  Eucerin Daily Replenishing Lotion  Eucerin Dry Skin Therapy Plus Alpha Hydroxy Crme  Eucerin Dry Skin Therapy Plus Alpha Hydroxy Lotion  Eucerin Original Crme  Eucerin Original Lotion  Eucerin Plus Crme Eucerin Plus Lotion  Eucerin TriLipid Replenishing Lotion  Keri Anti-Bacterial Hand Lotion  Keri Deep Conditioning Original Lotion Dry Skin Formula Softly Scented  Keri Deep Conditioning Original Lotion, Fragrance Free Sensitive Skin Formula  Keri Lotion Fast Absorbing Fragrance Free Sensitive Skin Formula  Keri Lotion Fast Absorbing Softly Scented Dry Skin Formula  Keri Original Lotion  Keri Skin Renewal Lotion Keri Silky Smooth Lotion  Keri Silky Smooth Sensitive Skin Lotion  Nivea Body Creamy Conditioning Oil  Nivea Body Extra Enriched Teacher, adult education Moisturizing Lotion Nivea Crme  Nivea Skin Firming Lotion  NutraDerm 30 Skin Lotion  NutraDerm Skin Lotion  NutraDerm Therapeutic Skin Cream  NutraDerm Therapeutic Skin Lotion  ProShield Protective Hand Cream  Provon moisturizing lotion  WHAT IS A BLOOD TRANSFUSION? Blood Transfusion Information  A transfusion is the replacement of blood or some of its parts. Blood is made up of multiple cells which provide different functions. Red blood cells carry oxygen and are used for blood loss  replacement. White blood cells fight against infection. Platelets control bleeding. Plasma helps clot blood. Other blood products are available for specialized needs, such as hemophilia or other clotting disorders. BEFORE THE TRANSFUSION  Who gives blood for transfusions?  Healthy volunteers who are fully evaluated to make sure their blood is safe. This is blood bank blood. Transfusion therapy is the safest it has ever been in the practice of medicine. Before blood is taken from a donor, a complete history is taken to make sure that person has no history of diseases nor engages in risky social behavior (examples are intravenous drug use or sexual activity with multiple partners). The donor's travel history is screened to minimize risk of transmitting infections, such as malaria. The donated blood is tested for signs of infectious diseases, such as HIV and hepatitis. The blood is then tested to be sure it is compatible with you in order to minimize the chance of a transfusion reaction. If you or a relative donates blood, this is often done in anticipation of surgery and is not appropriate for emergency situations. It takes many  days to process the donated blood. RISKS AND COMPLICATIONS Although transfusion therapy is very safe and saves many lives, the main dangers of transfusion include:  Getting an infectious disease. Developing a transfusion reaction. This is an allergic reaction to something in the blood you were given. Every precaution is taken to prevent this. The decision to have a blood transfusion has been considered carefully by your caregiver before blood is given. Blood is not given unless the benefits outweigh the risks. AFTER THE TRANSFUSION Right after receiving a blood transfusion, you will usually feel much better and more energetic. This is especially true if your red blood cells have gotten low (anemic). The transfusion raises the level of the red blood cells which carry oxygen, and  this usually causes an energy increase. The nurse administering the transfusion will monitor you carefully for complications. HOME CARE INSTRUCTIONS  No special instructions are needed after a transfusion. You may find your energy is better. Speak with your caregiver about any limitations on activity for underlying diseases you may have. SEEK MEDICAL CARE IF:  Your condition is not improving after your transfusion. You develop redness or irritation at the intravenous (IV) site. SEEK IMMEDIATE MEDICAL CARE IF:  Any of the following symptoms occur over the next 12 hours: Shaking chills. You have a temperature by mouth above 102 F (38.9 C), not controlled by medicine. Chest, back, or muscle pain. People around you feel you are not acting correctly or are confused. Shortness of breath or difficulty breathing. Dizziness and fainting. You get a rash or develop hives. You have a decrease in urine output. Your urine turns a dark color or changes to pink, red, or brown. Any of the following symptoms occur over the next 10 days: You have a temperature by mouth above 102 F (38.9 C), not controlled by medicine. Shortness of breath. Weakness after normal activity. The white part of the eye turns yellow (jaundice). You have a decrease in the amount of urine or are urinating less often. Your urine turns a dark color or changes to pink, red, or brown. Document Released: 10/31/2000 Document Revised: 01/26/2012 Document Reviewed: 06/19/2008 [employer] Patient Information 2014 [place], [place].  _______________________________________________________________________  Incentive Spirometer  An incentive spirometer is a tool that can help keep your lungs clear and active. This tool measures how well you are filling your lungs with each breath. Taking long deep breaths may help reverse or decrease the chance of developing breathing (pulmonary) problems (especially infection) following: A long period of  time when you are unable to move or be active. BEFORE THE PROCEDURE  If the spirometer includes an indicator to show your best effort, your nurse or respiratory therapist will set it to a desired goal. If possible, sit up straight or lean slightly forward. Try not to slouch. Hold the incentive spirometer in an upright position. INSTRUCTIONS FOR USE  Sit on the edge of your bed if possible, or sit up as far as you can in bed or on a chair. Hold the incentive spirometer in an upright position. Breathe out normally. Place the mouthpiece in your mouth and seal your lips tightly around it. Breathe in slowly and as deeply as possible, raising the piston or the ball toward the top of the column. Hold your breath for 3-5 seconds or for as long as possible. Allow the piston or ball to fall to the bottom of the column. Remove the mouthpiece from your mouth and breathe out normally. Rest for a few seconds  and repeat Steps 1 through 7 at least 10 times every 1-2 hours when you are awake. Take your time and take a few normal breaths between deep breaths. The spirometer may include an indicator to show your best effort. Use the indicator as a goal to work toward during each repetition. After each set of 10 deep breaths, practice coughing to be sure your lungs are clear. If you have an incision (the cut made at the time of surgery), support your incision when coughing by placing a pillow or rolled up towels firmly against it. Once you are able to get out of bed, walk around indoors and cough well. You may stop using the incentive spirometer when instructed by your caregiver.  RISKS AND COMPLICATIONS Take your time so you do not get dizzy or light-headed. If you are in pain, you may need to take or ask for pain medication before doing incentive spirometry. It is harder to take a deep breath if you are having pain. AFTER USE Rest and breathe slowly and easily. It can be helpful to keep track of a log of your  progress. Your caregiver can provide you with a simple table to help with this. If you are using the spirometer at home, follow these instructions: SEEK MEDICAL CARE IF:  You are having difficultly using the spirometer. You have trouble using the spirometer as often as instructed. Your pain medication is not giving enough relief while using the spirometer. You develop fever of 100.5 F (38.1 C) or higher. SEEK IMMEDIATE MEDICAL CARE IF:  You cough up bloody sputum that had not been present before. You develop fever of 102 F (38.9 C) or greater. You develop worsening pain at or near the incision site. MAKE SURE YOU:  Understand these instructions. Will watch your condition. Will get help right away if you are not doing well or get worse. Document Released: 03/16/2007 Document Revised: 01/26/2012 Document Reviewed: 05/17/2007 [facility] Patient Information 2014 [place], [place].   ________________________________________________________________________

## 2023-06-14 DIAGNOSIS — M1611 Unilateral primary osteoarthritis, right hip: Secondary | ICD-10-CM | POA: Diagnosis present

## 2023-06-14 NOTE — H&P (Signed)
Chief Complaint: Right hip pain  HPI: Reginald [name] is a 57 y.o. male who presents for evaluation of right hip pain It has been present for several years and has been worsening. He has failed conservative measures. Pain is rated as severe.  Past Medical History:  Diagnosis Date   Arthritis    Gallstones    Hypertension    Past Surgical History:  Procedure Laterality Date   CHOLECYSTECTOMY  12/03/2011   Procedure: LAPAROSCOPIC CHOLECYSTECTOMY WITH INTRAOPERATIVE CHOLANGIOGRAM;  Surgeon: Atilano Ina, MD;  Location: [facility] OR;  Service: General;  Laterality: N/A;  laparoscopic cholecystectomy with intraoperative cholangiogram   CHONDROPLASTY Right 09/19/2015   Procedure: CHONDROPLASTY;  Surgeon: Jodi Geralds, MD;  Location: [facility];  Service: Orthopedics;  Laterality: Right;   ENDOVENOUS ABLATION SAPHENOUS VEIN W/ LASER Right 01/13/2019   endovenous laser ablation right greater saphenous vein and stab phlebectomy 10-20 incisions right leg by Waverly Ferrari MD    IR ABLATE LIVER CRYOABLATION  04/03/2020   IR RADIOLOGIST EVAL & MGMT  03/28/2020   KNEE ARTHROSCOPY WITH LATERAL MENISECTOMY Right 09/19/2015   Procedure: KNEE ARTHROSCOPY WITH PARTIAL LATERAL MENISECTOMY;  Surgeon: Jodi Geralds, MD;  Location: [facility];  Service: Orthopedics;  Laterality: Right;   KNEE ARTHROSCOPY WITH MEDIAL MENISECTOMY Right 09/19/2015   Procedure: KNEE ARTHROSCOPY WITH PARTIAL MEDIAL MENISECTOMY;  Surgeon: Jodi Geralds, MD;  Location: [facility];  Service: Orthopedics;  Laterality: Right;   ROTATOR CUFF REPAIR Left    with revision   spider bite     black widow or brown recluse   TOE FUSION Left    TOTAL KNEE ARTHROPLASTY Right 03/31/2018   Procedure: RIGHT TOTAL KNEE ARTHROPLASTY;  Surgeon: Jodi Geralds, MD;  Location: [facility]S;  Service: Orthopedics;  Laterality: Right;   TOTAL KNEE REVISION Right 11/04/2019   Procedure: RIGHT TOTAL KNEE REVISION;   Surgeon: Jodi Geralds, MD;  Location: [facility];  Service: Orthopedics;  Laterality: Right;   TRIGGER FINGER RELEASE Right    Social History   Socioeconomic History   Marital status: Married    Spouse name: Not on file   Number of children: Not on file   Years of education: Not on file   Highest education level: Not on file  Occupational History   Not on file  Tobacco Use   Smoking status: Never   Smokeless tobacco: Never  Vaping Use   Vaping status: Never Used  Substance and Sexual Activity   Alcohol use: Yes    Alcohol/week: 1.0 standard drink of alcohol    Types: 1 Cans of beer per week    Comment: occa.   Drug use: No   Sexual activity: Not Currently  Other Topics Concern   Not on file  Social History Narrative   Not on file   Social Determinants of Health   Financial Resource Strain: Not on file  Food Insecurity: Not on file  Transportation Needs: Not on file  Physical Activity: Not on file  Stress: Not on file  Social Connections: Unknown (03/31/2022)   [facility]   Social Network    Social Network: Not on file   Family History  Problem Relation Age of Onset   Cancer Mother        breast   Heart disease Mother    Allergies  Allergen Reactions   Gabapentin     Head cloudy    Ibuprofen Nausea And Vomiting  High doses make him vomit.   Prior to Admission medications   Medication Sig Start Date End Date Taking? Authorizing Provider  folic acid (FOLVITE) 1 MG tablet Take 1 mg by mouth daily.   Yes [provider]  HYDROcodone-acetaminophen (NORCO) 10-325 MG tablet Take 1 tablet by mouth 3 (three) times daily. 06/05/23  Yes [provider]  losartan (COZAAR) 100 MG tablet Take 100 mg by mouth daily.   Yes [provider]  Menthol, Topical Analgesic, (BIOFREEZE EX) Apply 1 Application topically daily as needed (pain).   Yes [provider]  methotrexate (RHEUMATREX) 2.5 MG tablet Take 15 mg by  mouth every Monday. 05/26/23  Yes [provider]  VITAMIN D PO Take 1 capsule by mouth daily.   Yes [provider]     Positive ROS: positive for muscle aches and joint pains.  All other systems have been reviewed and were otherwise negative with the exception of those mentioned in the HPI and as above.  Physical Exam: Vital signs were stable  General: Alert, no acute distress Cardiovascular: No pedal edema Respiratory: No cyanosis, no use of accessory musculature GI: No organomegaly, abdomen is soft and non-tender Skin: No lesions in the area of chief complaint Neurologic: Sensation intact distally Psychiatric: Patient is competent for consent with normal mood and affect Lymphatic: No axillary or cervical lymphadenopathy  MUSCULOSKELETAL: Right hip exam: He has pain with internal and external rotation of the hip both which are limited.  SLR is negative to 85.  Assessment/Plan: RIGHT HIP DEGENERATIVE JOINT DISEASE Plan for Procedure(s):right TOTAL HIP ARTHROPLASTY ANTERIOR APPROACH

## 2023-06-15 ENCOUNTER — Ambulatory Visit (HOSPITAL_COMMUNITY): Payer: BLUE CROSS/BLUE SHIELD

## 2023-06-15 ENCOUNTER — Encounter (HOSPITAL_COMMUNITY)
Admission: RE | Disposition: A | Discharge status: Home / Self Care | Payer: Self-pay | Source: Home / Self Care | Attending: Orthopedic Surgery

## 2023-06-15 ENCOUNTER — Other Ambulatory Visit (HOSPITAL_COMMUNITY): Payer: BLUE CROSS/BLUE SHIELD

## 2023-06-15 ENCOUNTER — Other Ambulatory Visit: Payer: Self-pay

## 2023-06-15 ENCOUNTER — Encounter (HOSPITAL_COMMUNITY): Payer: Self-pay | Admitting: Orthopedic Surgery

## 2023-06-15 ENCOUNTER — Ambulatory Visit (HOSPITAL_COMMUNITY)
Admission: RE | Admit: 2023-06-15 | Discharge: 2023-06-15 | Disposition: A | Discharge status: Home / Self Care | Payer: BLUE CROSS/BLUE SHIELD | Attending: Orthopedic Surgery | Admitting: Orthopedic Surgery

## 2023-06-15 ENCOUNTER — Ambulatory Visit (HOSPITAL_COMMUNITY): Payer: BLUE CROSS/BLUE SHIELD | Admitting: Anesthesiology

## 2023-06-15 DIAGNOSIS — Z6839 Body mass index (BMI) 39.0-39.9, adult: Secondary | ICD-10-CM | POA: Insufficient documentation

## 2023-06-15 DIAGNOSIS — I10 Essential (primary) hypertension: Secondary | ICD-10-CM | POA: Insufficient documentation

## 2023-06-15 DIAGNOSIS — Z79899 Other long term (current) drug therapy: Secondary | ICD-10-CM | POA: Insufficient documentation

## 2023-06-15 DIAGNOSIS — M1611 Unilateral primary osteoarthritis, right hip: Secondary | ICD-10-CM | POA: Diagnosis present

## 2023-06-15 DIAGNOSIS — Z8249 Family history of ischemic heart disease and other diseases of the circulatory system: Secondary | ICD-10-CM | POA: Insufficient documentation

## 2023-06-15 HISTORY — PX: TOTAL HIP ARTHROPLASTY: SHX124

## 2023-06-15 SURGERY — ARTHROPLASTY, HIP, TOTAL, ANTERIOR APPROACH
Anesthesia: Spinal | Site: Hip | Laterality: Right

## 2023-06-15 MED ORDER — PHENYLEPHRINE HCL (PRESSORS) 10 MG/ML IV SOLN
INTRAVENOUS | Status: AC
  Filled 2023-06-15: qty 1

## 2023-06-15 MED ORDER — BUPIVACAINE-EPINEPHRINE 0.25% -1:200000 IJ SOLN
INTRAMUSCULAR | Status: DC | PRN
  Administered 2023-06-15: 30 mL

## 2023-06-15 MED ORDER — CHLORHEXIDINE GLUCONATE 0.12 % MT SOLN
15.0000 mL | Freq: Once | OROMUCOSAL | Status: AC
  Administered 2023-06-15: 15 mL via OROMUCOSAL

## 2023-06-15 MED ORDER — HYDROMORPHONE HCL 1 MG/ML IJ SOLN
INTRAMUSCULAR | Status: AC
  Administered 2023-06-15: 0.25 mg via INTRAVENOUS
  Filled 2023-06-15: qty 1

## 2023-06-15 MED ORDER — CELECOXIB 200 MG PO CAPS: 200.0000 mg | ORAL_CAPSULE | Freq: Every day | ORAL | 0 refills | Status: AC

## 2023-06-15 MED ORDER — ONDANSETRON HCL 4 MG/2ML IJ SOLN: 4.0000 mg | Freq: Four times a day (QID) | INTRAMUSCULAR | Status: DC | PRN

## 2023-06-15 MED ORDER — BUPIVACAINE LIPOSOME 1.3 % IJ SUSP: 10.0000 mL | Freq: Once | INTRAMUSCULAR | Status: DC

## 2023-06-15 MED ORDER — HYDROMORPHONE HCL 1 MG/ML IJ SOLN
0.2500 mg | INTRAMUSCULAR | Status: DC | PRN
  Administered 2023-06-15 (×2): 0.5 mg via INTRAVENOUS
  Administered 2023-06-15: 0.25 mg via INTRAVENOUS

## 2023-06-15 MED ORDER — METHOCARBAMOL 500 MG PO TABS: 500.0000 mg | ORAL_TABLET | Freq: Four times a day (QID) | ORAL | Status: DC | PRN

## 2023-06-15 MED ORDER — LACTATED RINGERS IV BOLUS
250.0000 mL | Freq: Once | INTRAVENOUS | Status: AC
  Administered 2023-06-15: 250 mL via INTRAVENOUS

## 2023-06-15 MED ORDER — LACTATED RINGERS IV BOLUS: 250.0000 mL | Freq: Once | INTRAVENOUS | Status: DC

## 2023-06-15 MED ORDER — TRANEXAMIC ACID-NACL 1000-0.7 MG/100ML-% IV SOLN
1000.0000 mg | INTRAVENOUS | Status: AC
  Administered 2023-06-15: 1000 mg via INTRAVENOUS
  Filled 2023-06-15: qty 100

## 2023-06-15 MED ORDER — HYDROMORPHONE HCL 1 MG/ML IJ SOLN
0.5000 mg | INTRAMUSCULAR | Status: DC | PRN
  Administered 2023-06-15 (×2): 0.5 mg via INTRAVENOUS

## 2023-06-15 MED ORDER — CEFAZOLIN IN SODIUM CHLORIDE 3-0.9 GM/100ML-% IV SOLN
3.0000 g | INTRAVENOUS | Status: AC
  Administered 2023-06-15: 3 g via INTRAVENOUS
  Filled 2023-06-15: qty 100

## 2023-06-15 MED ORDER — OXYCODONE HCL 5 MG PO TABS
5.0000 mg | ORAL_TABLET | ORAL | Status: DC | PRN
  Administered 2023-06-15: 10 mg via ORAL

## 2023-06-15 MED ORDER — LACTATED RINGERS IV BOLUS
500.0000 mL | Freq: Once | INTRAVENOUS | Status: AC
  Administered 2023-06-15: 500 mL via INTRAVENOUS

## 2023-06-15 MED ORDER — PROPOFOL 10 MG/ML IV BOLUS
INTRAVENOUS | Status: DC | PRN
Start: 2023-06-15 — End: 2023-06-15
  Administered 2023-06-15 (×2): 40 mg via INTRAVENOUS

## 2023-06-15 MED ORDER — BUPIVACAINE LIPOSOME 1.3 % IJ SUSP
INTRAMUSCULAR | Status: AC
  Filled 2023-06-15: qty 20

## 2023-06-15 MED ORDER — ACETAMINOPHEN 500 MG PO TABS
1000.0000 mg | ORAL_TABLET | Freq: Once | ORAL | Status: AC
  Administered 2023-06-15: 1000 mg via ORAL
  Filled 2023-06-15: qty 2

## 2023-06-15 MED ORDER — TIZANIDINE HCL 2 MG PO TABS: 2.0000 mg | ORAL_TABLET | Freq: Three times a day (TID) | ORAL | 0 refills | Status: AC | PRN

## 2023-06-15 MED ORDER — TRANEXAMIC ACID-NACL 1000-0.7 MG/100ML-% IV SOLN
1000.0000 mg | Freq: Once | INTRAVENOUS | Status: AC
  Administered 2023-06-15: 1000 mg via INTRAVENOUS

## 2023-06-15 MED ORDER — LACTATED RINGERS IV SOLN
INTRAVENOUS | Status: DC
  Administered 2023-06-15: 1000 mL via INTRAVENOUS

## 2023-06-15 MED ORDER — WATER FOR IRRIGATION, STERILE IR SOLN
Status: DC | PRN
  Administered 2023-06-15: 2000 mL

## 2023-06-15 MED ORDER — SODIUM CHLORIDE 0.9 % IR SOLN
Status: DC | PRN
  Administered 2023-06-15: 1000 mL

## 2023-06-15 MED ORDER — ACETAMINOPHEN 500 MG PO TABS: 1000.0000 mg | ORAL_TABLET | Freq: Four times a day (QID) | ORAL | Status: DC

## 2023-06-15 MED ORDER — POVIDONE-IODINE 10 % EX SWAB: 2.0000 | Freq: Once | CUTANEOUS | Status: DC

## 2023-06-15 MED ORDER — METHOCARBAMOL 500 MG IVPB - SIMPLE MED: 500.0000 mg | Freq: Four times a day (QID) | INTRAVENOUS | Status: DC | PRN

## 2023-06-15 MED ORDER — MIDAZOLAM HCL 5 MG/5ML IJ SOLN
INTRAMUSCULAR | Status: DC | PRN
  Administered 2023-06-15: 2 mg via INTRAVENOUS

## 2023-06-15 MED ORDER — ORAL CARE MOUTH RINSE: 15.0000 mL | Freq: Once | OROMUCOSAL | Status: AC

## 2023-06-15 MED ORDER — BUPIVACAINE IN DEXTROSE 0.75-8.25 % IT SOLN
INTRATHECAL | Status: DC | PRN
  Administered 2023-06-15: 2 mL via INTRATHECAL

## 2023-06-15 MED ORDER — ASPIRIN 325 MG PO TBEC: 325.0000 mg | DELAYED_RELEASE_TABLET | Freq: Two times a day (BID) | ORAL | 0 refills | Status: DC

## 2023-06-15 MED ORDER — DOCUSATE SODIUM 100 MG PO CAPS: 100.0000 mg | ORAL_CAPSULE | Freq: Two times a day (BID) | ORAL | 0 refills | Status: AC

## 2023-06-15 MED ORDER — OXYCODONE HCL 5 MG PO TABS
ORAL_TABLET | ORAL | Status: AC
  Filled 2023-06-15: qty 2

## 2023-06-15 MED ORDER — FENTANYL CITRATE (PF) 100 MCG/2ML IJ SOLN
INTRAMUSCULAR | Status: AC
  Filled 2023-06-15: qty 2

## 2023-06-15 MED ORDER — ONDANSETRON HCL 4 MG PO TABS: 4.0000 mg | ORAL_TABLET | Freq: Four times a day (QID) | ORAL | Status: DC | PRN

## 2023-06-15 MED ORDER — EPHEDRINE SULFATE (PRESSORS) 50 MG/ML IJ SOLN
INTRAMUSCULAR | Status: DC | PRN
  Administered 2023-06-15: 5 mg via INTRAVENOUS
  Administered 2023-06-15: 2.5 mg via INTRAVENOUS

## 2023-06-15 MED ORDER — BUPIVACAINE LIPOSOME 1.3 % IJ SUSP
INTRAMUSCULAR | Status: DC | PRN
  Administered 2023-06-15: 10 mL

## 2023-06-15 MED ORDER — METHOCARBAMOL 500 MG IVPB - SIMPLE MED
INTRAVENOUS | Status: AC
  Administered 2023-06-15: 500 mg via INTRAVENOUS
  Filled 2023-06-15: qty 55

## 2023-06-15 MED ORDER — CEFAZOLIN SODIUM-DEXTROSE 2-4 GM/100ML-% IV SOLN: 2.0000 g | Freq: Once | INTRAVENOUS | Status: DC

## 2023-06-15 MED ORDER — ACETAMINOPHEN 325 MG PO TABS: 325.0000 mg | ORAL_TABLET | Freq: Four times a day (QID) | ORAL | Status: DC | PRN

## 2023-06-15 MED ORDER — MIDAZOLAM HCL 2 MG/2ML IJ SOLN
INTRAMUSCULAR | Status: AC
  Filled 2023-06-15: qty 2

## 2023-06-15 MED ORDER — PHENYLEPHRINE HCL-NACL 20-0.9 MG/250ML-% IV SOLN
INTRAVENOUS | Status: DC | PRN
  Administered 2023-06-15: 30 ug/min via INTRAVENOUS

## 2023-06-15 MED ORDER — HYDROMORPHONE HCL 1 MG/ML IJ SOLN
INTRAMUSCULAR | Status: AC
  Administered 2023-06-15: 0.5 mg via INTRAVENOUS
  Filled 2023-06-15: qty 1

## 2023-06-15 MED ORDER — TRANEXAMIC ACID-NACL 1000-0.7 MG/100ML-% IV SOLN
INTRAVENOUS | Status: AC
  Filled 2023-06-15: qty 100

## 2023-06-15 MED ORDER — OXYCODONE-ACETAMINOPHEN 5-325 MG PO TABS: 1.0000 | ORAL_TABLET | Freq: Four times a day (QID) | ORAL | 0 refills | Status: AC | PRN

## 2023-06-15 MED ORDER — HYDROMORPHONE HCL 1 MG/ML IJ SOLN
INTRAMUSCULAR | Status: AC
  Filled 2023-06-15: qty 1

## 2023-06-15 MED ORDER — BUPIVACAINE-EPINEPHRINE 0.25% -1:200000 IJ SOLN
INTRAMUSCULAR | Status: AC
  Filled 2023-06-15: qty 1

## 2023-06-15 MED ORDER — PROPOFOL 1000 MG/100ML IV EMUL
INTRAVENOUS | Status: AC
  Filled 2023-06-15: qty 100

## 2023-06-15 MED ORDER — PROPOFOL 500 MG/50ML IV EMUL
INTRAVENOUS | Status: DC | PRN
  Administered 2023-06-15: 120 ug/kg/min via INTRAVENOUS

## 2023-06-15 MED ORDER — FENTANYL CITRATE (PF) 250 MCG/5ML IJ SOLN
INTRAMUSCULAR | Status: DC | PRN
  Administered 2023-06-15: 25 ug via INTRAVENOUS

## 2023-06-15 MED ORDER — BUPIVACAINE LIPOSOME 1.3 % IJ SUSP
INTRAMUSCULAR | Status: AC
  Filled 2023-06-15: qty 10

## 2023-06-15 SURGICAL SUPPLY — 44 items
APL SKNCLS STERI-STRIP NONHPOA (GAUZE/BANDAGES/DRESSINGS) ×1
BAG COUNTER SPONGE SURGICOUNT (BAG) IMPLANT
BAG SPEC THK2 15X12 ZIP CLS (MISCELLANEOUS)
BAG SPNG CNTER NS LX DISP (BAG)
BAG ZIPLOCK 12X15 (MISCELLANEOUS) IMPLANT
BENZOIN TINCTURE PRP APPL 2/3 (GAUZE/BANDAGES/DRESSINGS) IMPLANT
BLADE SAW SGTL 18X1.27X75 (BLADE) ×1 IMPLANT
BLADE SURG SZ10 CARB STEEL (BLADE) ×2 IMPLANT
COVER PERINEAL POST (MISCELLANEOUS) ×1 IMPLANT
COVER SURGICAL LIGHT HANDLE (MISCELLANEOUS) ×1 IMPLANT
CUP ACETBLR 54 OD 100 SERIES (Hips) IMPLANT
DRAPE FOOT SWITCH (DRAPES) ×1 IMPLANT
DRAPE STERI IOBAN 125X83 (DRAPES) ×1 IMPLANT
DRAPE U-SHAPE 47X51 STRL (DRAPES) ×2 IMPLANT
DRSG AQUACEL AG ADV 3.5X 6 (GAUZE/BANDAGES/DRESSINGS) ×1 IMPLANT
DURAPREP 26ML APPLICATOR (WOUND CARE) ×1 IMPLANT
ELECT REM PT RETURN 15FT ADLT (MISCELLANEOUS) ×1 IMPLANT
ELIMINATOR HOLE APEX DEPUY (Hips) IMPLANT
GAUZE XEROFORM 1X8 LF (GAUZE/BANDAGES/DRESSINGS) IMPLANT
GLOVE BIOGEL PI IND STRL 8 (GLOVE) ×2 IMPLANT
GLOVE ECLIPSE 7.5 STRL STRAW (GLOVE) ×2 IMPLANT
GOWN STRL REUS W/ TWL XL LVL3 (GOWN DISPOSABLE) ×2 IMPLANT
GOWN STRL REUS W/TWL XL LVL3 (GOWN DISPOSABLE) ×2
HEAD CERAMIC DELTA 36 PLUS 1.5 (Hips) IMPLANT
HOLDER FOLEY CATH W/STRAP (MISCELLANEOUS) ×1 IMPLANT
HOOD PEEL AWAY T7 (MISCELLANEOUS) ×3 IMPLANT
KIT TURNOVER KIT A (KITS) IMPLANT
LINER NEUTRAL 54X36MM PLUS 4 (Hips) IMPLANT
NDL HYPO 22X1.5 SAFETY MO (MISCELLANEOUS) ×1 IMPLANT
NEEDLE HYPO 22X1.5 SAFETY MO (MISCELLANEOUS) ×1 IMPLANT
PACK ANTERIOR HIP CUSTOM (KITS) ×1 IMPLANT
SPIKE FLUID TRANSFER (MISCELLANEOUS) ×1 IMPLANT
STAPLER VISISTAT 35W (STAPLE) IMPLANT
STEM FEMORAL SZ6 HIGH ACTIS (Stem) IMPLANT
STRIP CLOSURE SKIN 1/2X4 (GAUZE/BANDAGES/DRESSINGS) IMPLANT
SUT ETHIBOND NAB CT1 #1 30IN (SUTURE) ×2 IMPLANT
SUT MNCRL AB 3-0 PS2 18 (SUTURE) IMPLANT
SUT VIC AB 0 CT1 36 (SUTURE) ×1 IMPLANT
SUT VIC AB 1 CT1 36 (SUTURE) ×1 IMPLANT
SUT VIC AB 2-0 CT1 27 (SUTURE) ×1
SUT VIC AB 2-0 CT1 TAPERPNT 27 (SUTURE) ×1 IMPLANT
SUT VICRYL+ 3-0 36IN CT-1 (SUTURE) IMPLANT
TRAY FOLEY MTR SLVR 16FR STAT (SET/KITS/TRAYS/PACK) ×1 IMPLANT
TUBE SUCTION HIGH CAP CLEAR NV (SUCTIONS) ×1 IMPLANT

## 2023-06-15 NOTE — Interval H&P Note (Signed)
History and Physical Interval Note:  06/15/2023 7:38 AM  Reginald [name]  has presented today for surgery, with the diagnosis of RIGHT HIP DEGENERATIVE JOINT DISEASE.  The various methods of treatment have been discussed with the patient and family. After consideration of risks, benefits and other options for treatment, the patient has consented to  Procedure(s): TOTAL HIP ARTHROPLASTY ANTERIOR APPROACH (Right) as a surgical intervention.  The patient's history has been reviewed, patient examined, no change in status, stable for surgery.  I have reviewed the patient's chart and labs.  Questions were answered to the patient's satisfaction.     Harvie Junior

## 2023-06-15 NOTE — Discharge Instructions (Signed)

## 2023-06-15 NOTE — Evaluation (Signed)
Physical Therapy Evaluation Patient Details Name: Reginald [name] MRN: [id number] DOB: Nov 23, 1965 Today's Date: 06/15/2023  History of Present Illness  57 yo male presents to therapy s/p R THA, anterior approach on 06/15/2023 due to failure of conservative measures. Pt PMH includes but is not limited to: arthritis, gallstones, L RTC repair and scope, R hand chronic pain with trigger finger surgery,  HTN and R TKA.  Clinical Impression      Reginald [name] is a 57 y.o. male POD 0 s/p R THA. Patient reports IND with mobility at baseline. Patient is now limited by functional impairments (see PT problem list below) and requires min guard and cues for transfers and gait with RW. Patient was able to ambulate 50 and 20 feet with RW and min guard and cues for safe walker management. Patient educated on safe sequencing for stair mobility with R handrail, safety, fall risk prevention, use of CP/ice, pain management and goal, car transfers pt and  spouse verbalized understanding of safe guarding position for people assisting with mobility. Patient instructed in exercises to facilitate ROM and circulation reviewed and HO provided. Patient will benefit from continued skilled PT interventions to address impairments and progress towards PLOF. Patient has met mobility goals at adequate level for discharge home with family support and pt reports OPPT services to start 7/31; will continue to follow if pt continues acute stay to progress towards Mod I goals.     If plan is discharge home, recommend the following: A little help with walking and/or transfers;A little help with bathing/dressing/bathroom;Assistance with cooking/housework;Assist for transportation;Help with stairs or ramp for entrance   Can travel by private vehicle        Equipment Recommendations None recommended by PT  Recommendations for Other Services       Functional Status Assessment Patient has had a recent decline in their functional status  and demonstrates the ability to make significant improvements in function in a reasonable and predictable amount of time.     Precautions / Restrictions Precautions Precautions: Fall Restrictions Weight Bearing Restrictions: No      Mobility  Bed Mobility Overal bed mobility: Needs Assistance Bed Mobility: Supine to Sit     Supine to sit: Min guard     General bed mobility comments: increased time and cues    Transfers Overall transfer level: Needs assistance Equipment used: Rolling walker (2 wheels) Transfers: Sit to/from Stand Sit to Stand: Min guard           General transfer comment: cues for proper UE placement and with transfer tasks to recliner cues for proper body and RW placement    Ambulation/Gait Ambulation/Gait assistance: Min guard Gait Distance (Feet): 50 Feet Assistive device: Rolling walker (2 wheels) Gait Pattern/deviations: Step-to pattern, Antalgic, Trunk flexed       General Gait Details: cues for proper UE sequencing for gait tasks including retrograde stepping pattern s  Stairs Stairs: Yes Stairs assistance: Min guard Stair Management: One rail Right Number of Stairs: 2 General stair comments: PT assessed safety with step navigation with B handrail and then pt able to progress to B UE support on R handrial with cues for proper sequencing and technique with wife providing verbal understanding and able to cue pt L ascending and R descending  Wheelchair Mobility     Tilt Bed    Modified Rankin (Stroke Patients Only)       Balance Overall balance assessment: Needs assistance Sitting-balance support: Feet supported Sitting balance-Leahy Scale: Good  Standing balance support: Bilateral upper extremity supported, During functional activity, Reliant on assistive device for balance Standing balance-Leahy Scale: Poor                               Pertinent Vitals/Pain Pain Assessment Pain Assessment: 0-10 Pain  Score: 6  Pain Location: R  hip, R LE, R hand and L shoulder Pain Descriptors / Indicators: Aching, Constant, Discomfort, Operative site guarding, Guarding, Grimacing Pain Intervention(s): Limited activity within patient's tolerance, Monitored during session, Premedicated before session, Repositioned, Ice applied    Home Living Family/patient expects to be discharged to:: Private residence Living Arrangements: Spouse/significant other;Children Available Help at Discharge: Family Type of Home: House Home Access: Stairs to enter Entrance Stairs-Rails: None Entrance Stairs-Number of Steps: 1 Alternate Level Stairs-Number of Steps: 14 Home Layout: Two level;Bed/bath upstairs Home Equipment: Agricultural consultant (2 wheels);Cane - single point      Prior Function Prior Level of Function : Independent/Modified Independent;Driving             Mobility Comments: IND for all ADLs, self care tasks, IADLs. pt reports limiting community distances       Hand Dominance        Extremity/Trunk Assessment        Lower Extremity Assessment Lower Extremity Assessment: RLE deficits/detail RLE Deficits / Details: ankle DF/PF 5/5 RLE Sensation: WNL    Cervical / Trunk Assessment Cervical / Trunk Assessment:  (wfl)  Communication   Communication: No difficulties  Cognition Arousal/Alertness: Lethargic (pt intermittently falling asleep, reports at baseline pt falls asleep easily) Behavior During Therapy: WFL for tasks assessed/performed Overall Cognitive Status: Within Functional Limits for tasks assessed                                          General Comments      Exercises Total Joint Exercises Ankle Circles/Pumps: PROM, Both, 20 reps Quad Sets: AROM, Right, 5 reps Heel Slides: AROM, Right, 5 reps Hip ABduction/ADduction: AROM, Right, 5 reps, Standing Long Arc Quad: AROM, Right, 5 reps Knee Flexion: AROM, Right, 5 reps, Standing Standing Hip Extension: AROM,  Right, 5 reps   Assessment/Plan    PT Assessment Patient needs continued PT services  PT Problem List Decreased strength;Decreased range of motion;Decreased activity tolerance;Decreased balance;Decreased mobility;Decreased coordination;Pain       PT Treatment Interventions DME instruction;Gait training;Stair training;Functional mobility training;Therapeutic activities;Therapeutic exercise;Balance training;Neuromuscular re-education;Patient/family education;Modalities    PT Goals (Current goals can be found in the Care Plan section)  Acute Rehab PT Goals Patient Stated Goal: to be able to walk without pain PT Goal Formulation: With patient Time For Goal Achievement: 07/06/23 Potential to Achieve Goals: Good    Frequency 7X/week     Co-evaluation               AM-PAC PT "6 Clicks" Mobility  Outcome Measure Help needed turning from your back to your side while in a flat bed without using bedrails?: A Little Help needed moving from lying on your back to sitting on the side of a flat bed without using bedrails?: A Little Help needed moving to and from a bed to a chair (including a wheelchair)?: A Little Help needed standing up from a chair using your arms (e.g., wheelchair or bedside chair)?: A Little Help needed to walk in hospital room?: A  Little Help needed climbing 3-5 steps with a railing? : A Little 6 Click Score: 18    End of Session Equipment Utilized During Treatment: Gait belt Activity Tolerance: Patient tolerated treatment well Patient left: in chair;with call bell/phone within reach;with family/visitor present Nurse Communication: Mobility status;Other (comment) (pt readiness for d/c from PT standpoint) PT Visit Diagnosis: Unsteadiness on feet (R26.81);Other abnormalities of gait and mobility (R26.89);Muscle weakness (generalized) (M62.81);Difficulty in walking, not elsewhere classified (R26.2);Pain Pain - Right/Left: Right Pain - part of body:  Shoulder;Hand;Hip;Leg    Time: 1610-9604 PT Time Calculation (min) (ACUTE ONLY): 49 min   Charges:   PT Evaluation $PT Eval Low Complexity: 1 Low PT Treatments $Gait Training: 8-22 mins $Therapeutic Exercise: 8-22 mins PT General Charges $$ ACUTE PT VISIT: 1 Visit         Johnny Bridge, PT Acute Rehab   Jacqualyn Posey 06/15/2023, 3:25 PM

## 2023-06-15 NOTE — Op Note (Signed)
PATIENT ID:      Reginald [name]  MRN:     [id number] DOB/AGE:    Sep 07, 1966 / 57 y.o.       OPERATIVE REPORT    DATE OF PROCEDURE:  06/15/2023       PREOPERATIVE DIAGNOSIS:  RIGHT HIP DEGENERATIVE JOINT DISEASE                                                       Estimated body mass index is 39.22 kg/m as calculated from the following:   Height as of this encounter: 5\' 10"  (1.778 m).   Weight as of this encounter: 124 kg.     POSTOPERATIVE DIAGNOSIS:  RIGHT HIP DEGENERATIVE JOINT DISEASE                                                           PROCEDURE:  1. right total hip arthroplasty using a 54 mm DePuy Pinnacle  Cup, [employer],  neutral liner, a +4 36 mm ceramic head,  and a #6  Actisstem, 2.interpretation of multiple intraoperative fluoroscopic images   SURGEON: Harvie Junior    ASSISTANT:   Gus Puma PA-C  (present throughout entire procedure and necessary for timely completion of the procedure)  ANESTHESIA: spinal  BLOOD LOSS: 250 Tranexamic Acid: 1 gram IV DRAINS: None COMPLICATIONS: None    NDICATIONS FOR PROCEDURE:Patient with end-stage arthritis of the right hip.  X-rays show bone-on-bone arthritic changes. Despite conservative measures with observation, anti-inflammatory medicine, narcotics, use of a cane, has severe unremitting pain and can ambulate only less than 1 block before resting.  Patient desires elective right total hip arthroplasty to decrease pain and increase function. The risks, benefits, and alternatives were discussed at length including but not limited to the risks of infection, bleeding, nerve injury, stiffness, blood clots, the need for revision surgery, cardiopulmonary complications, among others, and they were willing to proceed.Benefits have been discussed. Questions answered.     PROCEDURE IN DETAIL: The patient was identified by armband,  received preoperative IV antibiotics in the holding area at [facility], taken  to the operating room , appropriate anesthetic monitors  were attached and spinal anesthesia was induced.  The patient was placed onto the hot bed and all bony prominences were well-padded.The right hip was prepped and draped for an anterior approach to the hip.  An incision was made and the subcutaneous dissection was down to the level of the tensor fascia.  The fascia was opened and finger dissected.  The bleeders coming across the anterior portion of the hip were identified and cauterized. Retractors were put in place above and below the femoral neck.  The capsule was opened and tagged and a provisional neck cut was made.  The head was removed and sized on the back table.  The acetabulum was sequentially reamed to a level of 53 mm and a 54 mm porous-coated pinnacle cup was hammered into place with 45 of lateral opening and 30 of anteversion.fluoroscopy was used to ensure this position of the cup.  Attention was turned towards the femur where the leg was  actually rotated, extended, and adduction did.  The femur was sequentially broached until a size of 6 broach High offset gave a perfect fit and fill.at this point a  1.5 mm delta ceramic hip ball was placed and the hip reduced.  Fluoroscopic images were taken to assess the leg length, fit and fill of the stem, and cup position.  We were happy with the construct at this point.  The 6 broach was removed and a final Actis stem with high offset  and a 1.5 mm ceramic hip ball was placed and reduced.  Final images were taken to make certain there were happy with the position at this point.   The capsule was closed with #1 Vicryl suture.  The tensor fascia was closed with 0 Vicryl suture.  The skin was then closed with combination of 0 and 2-0 Vicryl suture.  The top layer was with 3-0 Monocryl suture.  Benzoin and Steri-Strips were applied  and a sterile compressive dressing was applied and the patient taken to recovery room she noted be in satisfactory  condition.  Estimated blood loss for the procedure was approximately 300 cc.  Of note Gus Puma was present the entire case and assisted by retraction of tissues, manipulation of the leg, and closing the minimize or time.    Harvie Junior 06/15/2023, 9:13 AM

## 2023-06-15 NOTE — Anesthesia Postprocedure Evaluation (Signed)
Anesthesia Post Note  Patient: DONTAI TILLEY  Procedure(s) Performed: TOTAL HIP ARTHROPLASTY ANTERIOR APPROACH (Right: Hip)     Patient location during evaluation: PACU Anesthesia Type: Spinal Level of consciousness: oriented and awake and alert Pain management: pain level controlled Vital Signs Assessment: post-procedure vital signs reviewed and stable Respiratory status: spontaneous breathing and respiratory function stable Cardiovascular status: blood pressure returned to baseline and stable Postop Assessment: no headache, no backache, no apparent nausea or vomiting, spinal receding and patient able to bend at knees Anesthetic complications: no  No notable events documented.  Last Vitals:  Vitals:   06/15/23 1200 06/15/23 1300  BP: (!) 166/102 (!) 158/97  Pulse: 66 72  Resp: 14 14  Temp:    SpO2: 95% 96%    Last Pain:  Vitals:   06/15/23 1230  TempSrc:   PainSc: 6                  Marilouise Densmore,W. EDMOND

## 2023-06-15 NOTE — Anesthesia Procedure Notes (Signed)
Spinal  Patient location during procedure: OR Start time: 06/15/2023 7:38 AM End time: 06/15/2023 7:43 AM Reason for block: surgical anesthesia Staffing Performed: anesthesiologist  Anesthesiologist: Gaynelle Adu, MD Performed by: Gaynelle Adu, MD Authorized by: Gaynelle Adu, MD   Preanesthetic Checklist Completed: patient identified, IV checked, risks and benefits discussed, surgical consent, monitors and equipment checked, pre-op evaluation and timeout performed Spinal Block Patient position: sitting Prep: DuraPrep Patient monitoring: cardiac monitor, continuous pulse ox and blood pressure Approach: midline Location: L3-4 Injection technique: single-shot Needle Needle type: Pencan  Needle gauge: 24 G Needle length: 9 cm Assessment Sensory level: T8 Events: CSF return Additional Notes Functioning IV was confirmed and monitors were applied. Sterile prep and drape, including hand hygiene and sterile gloves were used. The patient was positioned and the spine was prepped. The skin was anesthetized with lidocaine.  Free flow of clear CSF was obtained prior to injecting local anesthetic into the CSF.  The spinal needle aspirated freely following injection.  The needle was carefully withdrawn.  The patient tolerated the procedure well.

## 2023-06-15 NOTE — Transfer of Care (Signed)
Immediate Anesthesia Transfer of Care Note  Patient: Reginald [name]  Procedure(s) Performed: TOTAL HIP ARTHROPLASTY ANTERIOR APPROACH (Right: Hip)  Patient Location: PACU  Anesthesia Type:Spinal  Level of Consciousness: awake, alert , oriented, and drowsy  Airway & Oxygen Therapy: Patient Spontanous Breathing  Post-op Assessment: Report given to RN and Post -op Vital signs reviewed and stable  Post vital signs: Reviewed and stable  Last Vitals:  Vitals Value Taken Time  BP 139/80 06/15/23 0941  Temp    Pulse 77 06/15/23 0941  Resp 18 06/15/23 0941  SpO2 98 % 06/15/23 0941  Vitals shown include unfiled device data.  Last Pain:  Vitals:   06/15/23 0623  TempSrc: Oral  PainSc:          Complications: No notable events documented.

## 2023-06-16 ENCOUNTER — Encounter (HOSPITAL_COMMUNITY): Payer: Self-pay | Admitting: Orthopedic Surgery

## 2024-01-18 DIAGNOSIS — M25562 Pain in left knee: Secondary | ICD-10-CM | POA: Diagnosis not present

## 2024-01-18 DIAGNOSIS — I10 Essential (primary) hypertension: Secondary | ICD-10-CM | POA: Diagnosis not present

## 2024-01-18 DIAGNOSIS — G8929 Other chronic pain: Secondary | ICD-10-CM | POA: Diagnosis not present

## 2024-01-18 DIAGNOSIS — M25561 Pain in right knee: Secondary | ICD-10-CM | POA: Diagnosis not present

## 2024-01-18 DIAGNOSIS — Z79899 Other long term (current) drug therapy: Secondary | ICD-10-CM | POA: Diagnosis not present

## 2024-01-18 DIAGNOSIS — R030 Elevated blood-pressure reading, without diagnosis of hypertension: Secondary | ICD-10-CM | POA: Diagnosis not present

## 2024-01-18 DIAGNOSIS — G894 Chronic pain syndrome: Secondary | ICD-10-CM | POA: Diagnosis not present

## 2024-01-18 DIAGNOSIS — M25512 Pain in left shoulder: Secondary | ICD-10-CM | POA: Diagnosis not present

## 2024-01-18 DIAGNOSIS — M545 Low back pain, unspecified: Secondary | ICD-10-CM | POA: Diagnosis not present

## 2024-01-18 DIAGNOSIS — M150 Primary generalized (osteo)arthritis: Secondary | ICD-10-CM | POA: Diagnosis not present

## 2024-01-18 DIAGNOSIS — Z6841 Body Mass Index (BMI) 40.0 and over, adult: Secondary | ICD-10-CM | POA: Diagnosis not present

## 2024-01-21 NOTE — H&P (Signed)
 Patient's anticipated LOS is less than 2 midnights, meeting these requirements: - Younger than 65 - Lives within 1 hour of care - Has a competent adult at home to recover with post-op recover - NO history of  - Chronic pain requiring opiods  - Diabetes  - Coronary Artery Disease  - Heart failure  - Heart attack  - Stroke  - DVT/VTE  - Cardiac arrhythmia  - Respiratory Failure/COPD  - Renal failure  - Anemia  - Advanced Liver disease     Reginald [name] is an 58 y.o. male.    Chief Complaint: left shoulder pain  HPI: Pt is a 58 y.o. male complaining of left shoulder pain for multiple years. Pain had continually increased since the beginning. X-rays in the clinic show end-stage arthritic changes of the left shoulder. Pt has tried various conservative treatments which have failed to alleviate their symptoms, including injections and therapy. Various options are discussed with the patient. Risks, benefits and expectations were discussed with the patient. Patient understand the risks, benefits and expectations and wishes to proceed with surgery.   PCP:  [facility]  D/C Plans: Home  PMH: Past Medical History:  Diagnosis Date   Arthritis    Gallstones    Hypertension     PSH: Past Surgical History:  Procedure Laterality Date   CHOLECYSTECTOMY  12/03/2011   Procedure: LAPAROSCOPIC CHOLECYSTECTOMY WITH INTRAOPERATIVE CHOLANGIOGRAM;  Surgeon: Atilano Ina, MD;  Location: [facility] OR;  Service: General;  Laterality: N/A;  laparoscopic cholecystectomy with intraoperative cholangiogram   CHONDROPLASTY Right 09/19/2015   Procedure: CHONDROPLASTY;  Surgeon: Jodi Geralds, MD;  Location: [facility];  Service: Orthopedics;  Laterality: Right;   ENDOVENOUS ABLATION SAPHENOUS VEIN W/ LASER Right 01/13/2019   endovenous laser ablation right greater saphenous vein and stab phlebectomy 10-20 incisions right leg by Waverly Ferrari MD    IR ABLATE LIVER  CRYOABLATION  04/03/2020   IR RADIOLOGIST EVAL & MGMT  03/28/2020   KNEE ARTHROSCOPY WITH LATERAL MENISECTOMY Right 09/19/2015   Procedure: KNEE ARTHROSCOPY WITH PARTIAL LATERAL MENISECTOMY;  Surgeon: Jodi Geralds, MD;  Location: [facility];  Service: Orthopedics;  Laterality: Right;   KNEE ARTHROSCOPY WITH MEDIAL MENISECTOMY Right 09/19/2015   Procedure: KNEE ARTHROSCOPY WITH PARTIAL MEDIAL MENISECTOMY;  Surgeon: Jodi Geralds, MD;  Location: [facility];  Service: Orthopedics;  Laterality: Right;   ROTATOR CUFF REPAIR Left    with revision   spider bite     black widow or brown recluse   TOE FUSION Left    TOTAL HIP ARTHROPLASTY Right 06/15/2023   Procedure: TOTAL HIP ARTHROPLASTY ANTERIOR APPROACH;  Surgeon: Jodi Geralds, MD;  Location: [facility];  Service: Orthopedics;  Laterality: Right;   TOTAL KNEE ARTHROPLASTY Right 03/31/2018   Procedure: RIGHT TOTAL KNEE ARTHROPLASTY;  Surgeon: Jodi Geralds, MD;  Location: [facility];  Service: Orthopedics;  Laterality: Right;   TOTAL KNEE REVISION Right 11/04/2019   Procedure: RIGHT TOTAL KNEE REVISION;  Surgeon: Jodi Geralds, MD;  Location: [facility];  Service: Orthopedics;  Laterality: Right;   TRIGGER FINGER RELEASE Right     Social History:  reports that he has never smoked. He has never used smokeless tobacco. He reports current alcohol use of about 1.0 standard drink of alcohol per week. He reports that he does not use drugs. BMI: Estimated body mass index is 39.22 kg/m as calculated from the following:   Height as of 06/15/23: 5\' 10"  (1.778 m).  Weight as of 06/15/23: 124 kg.  Lab Results  Component Value Date   ALBUMIN 4.1 11/01/2019   Diabetes: Patient does not have a diagnosis of diabetes.     Smoking Status:   reports that he has never smoked. He has never used smokeless tobacco.    Allergies:  Allergies  Allergen Reactions   Gabapentin     Head cloudy    Ibuprofen Nausea And Vomiting    High doses make  him vomit.    Medications: No current facility-administered medications for this encounter.   Current Outpatient Medications  Medication Sig Dispense Refill   aspirin EC 325 MG tablet Take 1 tablet (325 mg total) by mouth 2 (two) times daily after a meal. Take x 1 month post op to decrease risk of blood clots. 60 tablet 0   celecoxib (CELEBREX) 200 MG capsule Take 1 capsule (200 mg total) by mouth daily after breakfast. 30 capsule 0   docusate sodium (COLACE) 100 MG capsule Take 1 capsule (100 mg total) by mouth 2 (two) times daily. 30 capsule 0   folic acid (FOLVITE) 1 MG tablet Take 1 mg by mouth daily.     HYDROcodone-acetaminophen (NORCO) 10-325 MG tablet Take 1 tablet by mouth 3 (three) times daily.     losartan (COZAAR) 100 MG tablet Take 100 mg by mouth daily.     Menthol, Topical Analgesic, (BIOFREEZE EX) Apply 1 Application topically daily as needed (pain).     oxyCODONE-acetaminophen (PERCOCET/ROXICET) 5-325 MG tablet Take 1-2 tablets by mouth every 6 (six) hours as needed for severe pain. 30 tablet 0   tiZANidine (ZANAFLEX) 2 MG tablet Take 1 tablet (2 mg total) by mouth every 8 (eight) hours as needed for muscle spasms. 40 tablet 0   VITAMIN D PO Take 1 capsule by mouth daily.      No results found for this or any previous visit (from the past 48 hours). No results found.  ROS: Pain with rom of the left upper extremity  Physical Exam: Alert and oriented 58 y.o. male in no acute distress Cranial nerves 2-12 intact Cervical spine: full rom with no tenderness, nv intact distally Chest: active breath sounds bilaterally, no wheeze rhonchi or rales Heart: regular rate and rhythm, no murmur Abd: non tender non distended with active bowel sounds Hip is stable with rom  Left shoulder painful and weak rom Nv intact distally No rashes or edema distally  Assessment/Plan Assessment: left shoulder cuff arthropathy  Plan:  Patient will undergo a left reverse total shoulder by  Dr. Ranell Patrick at [facility] Risks benefits and expectations were discussed with the patient. Patient understand risks, benefits and expectations and wishes to proceed. Preoperative templating of the joint replacement has been completed, documented, and submitted to the Operating Room personnel in order to optimize intra-operative equipment management.   Alphonsa Overall PA-C, MPAS [facility] is now [employer] [address], [place], [place] [address] Phone: [phone] [email or web address] Facebook  [employer]

## 2024-01-27 DIAGNOSIS — M25642 Stiffness of left hand, not elsewhere classified: Secondary | ICD-10-CM | POA: Diagnosis not present

## 2024-01-27 DIAGNOSIS — M79642 Pain in left hand: Secondary | ICD-10-CM | POA: Diagnosis not present

## 2024-01-27 DIAGNOSIS — M19041 Primary osteoarthritis, right hand: Secondary | ICD-10-CM | POA: Diagnosis not present

## 2024-01-28 DIAGNOSIS — M0579 Rheumatoid arthritis with rheumatoid factor of multiple sites without organ or systems involvement: Secondary | ICD-10-CM | POA: Diagnosis not present

## 2024-01-28 DIAGNOSIS — Z6841 Body Mass Index (BMI) 40.0 and over, adult: Secondary | ICD-10-CM | POA: Diagnosis not present

## 2024-01-28 DIAGNOSIS — M79642 Pain in left hand: Secondary | ICD-10-CM | POA: Diagnosis not present

## 2024-01-28 DIAGNOSIS — M1991 Primary osteoarthritis, unspecified site: Secondary | ICD-10-CM | POA: Diagnosis not present

## 2024-01-28 DIAGNOSIS — R5382 Chronic fatigue, unspecified: Secondary | ICD-10-CM | POA: Diagnosis not present

## 2024-01-28 DIAGNOSIS — M79641 Pain in right hand: Secondary | ICD-10-CM | POA: Diagnosis not present

## 2024-01-28 DIAGNOSIS — R768 Other specified abnormal immunological findings in serum: Secondary | ICD-10-CM | POA: Diagnosis not present

## 2024-02-01 ENCOUNTER — Encounter (HOSPITAL_COMMUNITY): Admission: RE | Admit: 2024-02-01 | Payer: BLUE CROSS/BLUE SHIELD | Source: Ambulatory Visit

## 2024-02-12 ENCOUNTER — Ambulatory Visit (HOSPITAL_COMMUNITY): Admit: 2024-02-12 | Payer: BLUE CROSS/BLUE SHIELD | Admitting: Orthopedic Surgery

## 2024-02-12 SURGERY — ARTHROPLASTY, SHOULDER, TOTAL, REVERSE
Anesthesia: Choice | Site: Shoulder | Laterality: Left

## 2024-02-15 DIAGNOSIS — H547 Unspecified visual loss: Secondary | ICD-10-CM | POA: Diagnosis not present

## 2024-02-15 DIAGNOSIS — G8929 Other chronic pain: Secondary | ICD-10-CM | POA: Diagnosis not present

## 2024-02-15 DIAGNOSIS — D84821 Immunodeficiency due to drugs: Secondary | ICD-10-CM | POA: Diagnosis not present

## 2024-02-15 DIAGNOSIS — M1990 Unspecified osteoarthritis, unspecified site: Secondary | ICD-10-CM | POA: Diagnosis not present

## 2024-02-15 DIAGNOSIS — H04123 Dry eye syndrome of bilateral lacrimal glands: Secondary | ICD-10-CM | POA: Diagnosis not present

## 2024-02-15 DIAGNOSIS — M069 Rheumatoid arthritis, unspecified: Secondary | ICD-10-CM | POA: Diagnosis not present

## 2024-02-15 DIAGNOSIS — Z6841 Body Mass Index (BMI) 40.0 and over, adult: Secondary | ICD-10-CM | POA: Diagnosis not present

## 2024-02-15 DIAGNOSIS — I10 Essential (primary) hypertension: Secondary | ICD-10-CM | POA: Diagnosis not present

## 2024-02-15 DIAGNOSIS — M62838 Other muscle spasm: Secondary | ICD-10-CM | POA: Diagnosis not present

## 2024-02-15 DIAGNOSIS — D8481 Immunodeficiency due to conditions classified elsewhere: Secondary | ICD-10-CM | POA: Diagnosis not present

## 2024-02-18 DIAGNOSIS — E6609 Other obesity due to excess calories: Secondary | ICD-10-CM | POA: Diagnosis not present

## 2024-02-18 DIAGNOSIS — M25512 Pain in left shoulder: Secondary | ICD-10-CM | POA: Diagnosis not present

## 2024-02-18 DIAGNOSIS — R030 Elevated blood-pressure reading, without diagnosis of hypertension: Secondary | ICD-10-CM | POA: Diagnosis not present

## 2024-02-18 DIAGNOSIS — G894 Chronic pain syndrome: Secondary | ICD-10-CM | POA: Diagnosis not present

## 2024-02-18 DIAGNOSIS — M25561 Pain in right knee: Secondary | ICD-10-CM | POA: Diagnosis not present

## 2024-02-18 DIAGNOSIS — Z6832 Body mass index (BMI) 32.0-32.9, adult: Secondary | ICD-10-CM | POA: Diagnosis not present

## 2024-02-18 DIAGNOSIS — M150 Primary generalized (osteo)arthritis: Secondary | ICD-10-CM | POA: Diagnosis not present

## 2024-02-18 DIAGNOSIS — M25562 Pain in left knee: Secondary | ICD-10-CM | POA: Diagnosis not present

## 2024-02-18 DIAGNOSIS — I10 Essential (primary) hypertension: Secondary | ICD-10-CM | POA: Diagnosis not present

## 2024-02-18 DIAGNOSIS — Z79899 Other long term (current) drug therapy: Secondary | ICD-10-CM | POA: Diagnosis not present

## 2024-02-18 DIAGNOSIS — R29818 Other symptoms and signs involving the nervous system: Secondary | ICD-10-CM | POA: Diagnosis not present

## 2024-02-18 DIAGNOSIS — G8929 Other chronic pain: Secondary | ICD-10-CM | POA: Diagnosis not present

## 2024-02-18 DIAGNOSIS — M545 Low back pain, unspecified: Secondary | ICD-10-CM | POA: Diagnosis not present

## 2024-02-22 DIAGNOSIS — M7061 Trochanteric bursitis, right hip: Secondary | ICD-10-CM | POA: Diagnosis not present

## 2024-03-10 DIAGNOSIS — M19041 Primary osteoarthritis, right hand: Secondary | ICD-10-CM | POA: Diagnosis not present

## 2024-03-10 DIAGNOSIS — M19042 Primary osteoarthritis, left hand: Secondary | ICD-10-CM | POA: Diagnosis not present

## 2024-03-18 DIAGNOSIS — R030 Elevated blood-pressure reading, without diagnosis of hypertension: Secondary | ICD-10-CM | POA: Diagnosis not present

## 2024-03-18 DIAGNOSIS — M545 Low back pain, unspecified: Secondary | ICD-10-CM | POA: Diagnosis not present

## 2024-03-18 DIAGNOSIS — R29818 Other symptoms and signs involving the nervous system: Secondary | ICD-10-CM | POA: Diagnosis not present

## 2024-03-18 DIAGNOSIS — M150 Primary generalized (osteo)arthritis: Secondary | ICD-10-CM | POA: Diagnosis not present

## 2024-03-18 DIAGNOSIS — M25562 Pain in left knee: Secondary | ICD-10-CM | POA: Diagnosis not present

## 2024-03-18 DIAGNOSIS — I10 Essential (primary) hypertension: Secondary | ICD-10-CM | POA: Diagnosis not present

## 2024-03-18 DIAGNOSIS — M069 Rheumatoid arthritis, unspecified: Secondary | ICD-10-CM | POA: Diagnosis not present

## 2024-03-18 DIAGNOSIS — M25561 Pain in right knee: Secondary | ICD-10-CM | POA: Diagnosis not present

## 2024-03-18 DIAGNOSIS — M25512 Pain in left shoulder: Secondary | ICD-10-CM | POA: Diagnosis not present

## 2024-03-18 DIAGNOSIS — G894 Chronic pain syndrome: Secondary | ICD-10-CM | POA: Diagnosis not present

## 2024-03-18 DIAGNOSIS — E6609 Other obesity due to excess calories: Secondary | ICD-10-CM | POA: Diagnosis not present

## 2024-03-22 DIAGNOSIS — M25551 Pain in right hip: Secondary | ICD-10-CM | POA: Diagnosis not present

## 2024-04-18 DIAGNOSIS — R030 Elevated blood-pressure reading, without diagnosis of hypertension: Secondary | ICD-10-CM | POA: Diagnosis not present

## 2024-04-18 DIAGNOSIS — M150 Primary generalized (osteo)arthritis: Secondary | ICD-10-CM | POA: Diagnosis not present

## 2024-04-18 DIAGNOSIS — I10 Essential (primary) hypertension: Secondary | ICD-10-CM | POA: Diagnosis not present

## 2024-04-18 DIAGNOSIS — M25562 Pain in left knee: Secondary | ICD-10-CM | POA: Diagnosis not present

## 2024-04-18 DIAGNOSIS — M069 Rheumatoid arthritis, unspecified: Secondary | ICD-10-CM | POA: Diagnosis not present

## 2024-04-18 DIAGNOSIS — M545 Low back pain, unspecified: Secondary | ICD-10-CM | POA: Diagnosis not present

## 2024-04-18 DIAGNOSIS — M25561 Pain in right knee: Secondary | ICD-10-CM | POA: Diagnosis not present

## 2024-04-18 DIAGNOSIS — M25512 Pain in left shoulder: Secondary | ICD-10-CM | POA: Diagnosis not present

## 2024-04-18 DIAGNOSIS — G894 Chronic pain syndrome: Secondary | ICD-10-CM | POA: Diagnosis not present

## 2024-04-18 DIAGNOSIS — E6609 Other obesity due to excess calories: Secondary | ICD-10-CM | POA: Diagnosis not present

## 2024-04-19 DIAGNOSIS — M7061 Trochanteric bursitis, right hip: Secondary | ICD-10-CM | POA: Diagnosis not present

## 2024-04-28 DIAGNOSIS — M0579 Rheumatoid arthritis with rheumatoid factor of multiple sites without organ or systems involvement: Secondary | ICD-10-CM | POA: Diagnosis not present

## 2024-05-16 DIAGNOSIS — M069 Rheumatoid arthritis, unspecified: Secondary | ICD-10-CM | POA: Diagnosis not present

## 2024-05-16 DIAGNOSIS — M150 Primary generalized (osteo)arthritis: Secondary | ICD-10-CM | POA: Diagnosis not present

## 2024-05-16 DIAGNOSIS — M25512 Pain in left shoulder: Secondary | ICD-10-CM | POA: Diagnosis not present

## 2024-05-16 DIAGNOSIS — G8929 Other chronic pain: Secondary | ICD-10-CM | POA: Diagnosis not present

## 2024-05-16 DIAGNOSIS — G894 Chronic pain syndrome: Secondary | ICD-10-CM | POA: Diagnosis not present

## 2024-05-16 DIAGNOSIS — I10 Essential (primary) hypertension: Secondary | ICD-10-CM | POA: Diagnosis not present

## 2024-05-16 DIAGNOSIS — M25561 Pain in right knee: Secondary | ICD-10-CM | POA: Diagnosis not present

## 2024-05-16 DIAGNOSIS — E6609 Other obesity due to excess calories: Secondary | ICD-10-CM | POA: Diagnosis not present

## 2024-05-16 DIAGNOSIS — Z6838 Body mass index (BMI) 38.0-38.9, adult: Secondary | ICD-10-CM | POA: Diagnosis not present

## 2024-05-16 DIAGNOSIS — Z79899 Other long term (current) drug therapy: Secondary | ICD-10-CM | POA: Diagnosis not present

## 2024-05-16 DIAGNOSIS — M545 Low back pain, unspecified: Secondary | ICD-10-CM | POA: Diagnosis not present

## 2024-06-03 DIAGNOSIS — M0579 Rheumatoid arthritis with rheumatoid factor of multiple sites without organ or systems involvement: Secondary | ICD-10-CM | POA: Diagnosis not present

## 2024-06-03 DIAGNOSIS — Z79899 Other long term (current) drug therapy: Secondary | ICD-10-CM | POA: Diagnosis not present

## 2024-06-03 DIAGNOSIS — I10 Essential (primary) hypertension: Secondary | ICD-10-CM | POA: Diagnosis not present

## 2024-06-03 DIAGNOSIS — E7800 Pure hypercholesterolemia, unspecified: Secondary | ICD-10-CM | POA: Diagnosis not present

## 2024-06-03 DIAGNOSIS — Z125 Encounter for screening for malignant neoplasm of prostate: Secondary | ICD-10-CM | POA: Diagnosis not present

## 2024-06-03 DIAGNOSIS — Z0000 Encounter for general adult medical examination without abnormal findings: Secondary | ICD-10-CM | POA: Diagnosis not present

## 2024-06-06 DIAGNOSIS — M8938 Hypertrophy of bone, other site: Secondary | ICD-10-CM | POA: Diagnosis not present

## 2024-06-06 DIAGNOSIS — M89351 Hypertrophy of bone, right femur: Secondary | ICD-10-CM | POA: Diagnosis not present

## 2024-06-06 DIAGNOSIS — Z96641 Presence of right artificial hip joint: Secondary | ICD-10-CM | POA: Diagnosis not present

## 2024-06-06 DIAGNOSIS — M7061 Trochanteric bursitis, right hip: Secondary | ICD-10-CM | POA: Diagnosis not present

## 2024-06-06 DIAGNOSIS — M7631 Iliotibial band syndrome, right leg: Secondary | ICD-10-CM | POA: Diagnosis not present

## 2024-06-15 DIAGNOSIS — M150 Primary generalized (osteo)arthritis: Secondary | ICD-10-CM | POA: Diagnosis not present

## 2024-06-15 DIAGNOSIS — R0602 Shortness of breath: Secondary | ICD-10-CM | POA: Diagnosis not present

## 2024-06-15 DIAGNOSIS — G894 Chronic pain syndrome: Secondary | ICD-10-CM | POA: Diagnosis not present

## 2024-06-15 DIAGNOSIS — Z79899 Other long term (current) drug therapy: Secondary | ICD-10-CM | POA: Diagnosis not present

## 2024-06-15 DIAGNOSIS — I10 Essential (primary) hypertension: Secondary | ICD-10-CM | POA: Diagnosis not present

## 2024-06-15 DIAGNOSIS — G8929 Other chronic pain: Secondary | ICD-10-CM | POA: Diagnosis not present

## 2024-06-15 DIAGNOSIS — M25551 Pain in right hip: Secondary | ICD-10-CM | POA: Diagnosis not present

## 2024-06-20 DIAGNOSIS — Z79899 Other long term (current) drug therapy: Secondary | ICD-10-CM | POA: Diagnosis not present

## 2024-07-07 ENCOUNTER — Ambulatory Visit (HOSPITAL_BASED_OUTPATIENT_CLINIC_OR_DEPARTMENT_OTHER): Payer: Self-pay | Attending: Orthopedic Surgery | Admitting: Physical Therapy

## 2024-07-07 ENCOUNTER — Encounter (HOSPITAL_BASED_OUTPATIENT_CLINIC_OR_DEPARTMENT_OTHER): Payer: Self-pay | Admitting: Physical Therapy

## 2024-07-07 ENCOUNTER — Other Ambulatory Visit: Payer: Self-pay

## 2024-07-07 DIAGNOSIS — M6281 Muscle weakness (generalized): Secondary | ICD-10-CM | POA: Diagnosis not present

## 2024-07-07 DIAGNOSIS — R2689 Other abnormalities of gait and mobility: Secondary | ICD-10-CM

## 2024-07-07 DIAGNOSIS — Z9889 Other specified postprocedural states: Secondary | ICD-10-CM | POA: Insufficient documentation

## 2024-07-07 DIAGNOSIS — M25551 Pain in right hip: Secondary | ICD-10-CM

## 2024-07-07 DIAGNOSIS — R269 Unspecified abnormalities of gait and mobility: Secondary | ICD-10-CM | POA: Insufficient documentation

## 2024-07-07 DIAGNOSIS — R29898 Other symptoms and signs involving the musculoskeletal system: Secondary | ICD-10-CM

## 2024-07-07 NOTE — Therapy (Signed)
 OUTPATIENT PHYSICAL THERAPY LOWER EXTREMITY EVALUATION   Patient Name: Reginald [name] MRN: [id number] DOB:07-Oct-1966, 58 y.o., male Today's Date: 07/07/2024  END OF SESSION:  PT End of Session - 07/07/24 1349     Visit Number 1    Number of Visits 16    Date for PT Re-Evaluation 09/01/24    Authorization Type [employer]    Progress Note Due on Visit 10    PT Start Time 1349    PT Stop Time 1424    PT Time Calculation (min) 35 min    Activity Tolerance Patient tolerated treatment well    Behavior During Therapy WFL for tasks assessed/performed          Past Medical History:  Diagnosis Date   Arthritis    Gallstones    Hypertension    Past Surgical History:  Procedure Laterality Date   CHOLECYSTECTOMY  12/03/2011   Procedure: LAPAROSCOPIC CHOLECYSTECTOMY WITH INTRAOPERATIVE CHOLANGIOGRAM;  Surgeon: Camellia CHRISTELLA Blush, MD;  Location: [facility];  Service: General;  Laterality: N/A;  laparoscopic cholecystectomy with intraoperative cholangiogram   CHONDROPLASTY Right 09/19/2015   Procedure: CHONDROPLASTY;  Surgeon: Norleen Gavel, MD;  Location: [facility];  Service: Orthopedics;  Laterality: Right;   ENDOVENOUS ABLATION SAPHENOUS VEIN W/ LASER Right 01/13/2019   endovenous laser ablation right greater saphenous vein and stab phlebectomy 10-20 incisions right leg by Lonni Blade MD    IR ABLATE LIVER CRYOABLATION  04/03/2020   IR RADIOLOGIST EVAL & MGMT  03/28/2020   KNEE ARTHROSCOPY WITH LATERAL MENISECTOMY Right 09/19/2015   Procedure: KNEE ARTHROSCOPY WITH PARTIAL LATERAL MENISECTOMY;  Surgeon: Norleen Gavel, MD;  Location: [facility];  Service: Orthopedics;  Laterality: Right;   KNEE ARTHROSCOPY WITH MEDIAL MENISECTOMY Right 09/19/2015   Procedure: KNEE ARTHROSCOPY WITH PARTIAL MEDIAL MENISECTOMY;  Surgeon: Norleen Gavel, MD;  Location: [facility];  Service: Orthopedics;  Laterality: Right;   ROTATOR CUFF REPAIR Left    with  revision   spider bite     black widow or brown recluse   TOE FUSION Left    TOTAL HIP ARTHROPLASTY Right 06/15/2023   Procedure: TOTAL HIP ARTHROPLASTY ANTERIOR APPROACH;  Surgeon: Gavel Norleen, MD;  Location: [facility];  Service: Orthopedics;  Laterality: Right;   TOTAL KNEE ARTHROPLASTY Right 03/31/2018   Procedure: RIGHT TOTAL KNEE ARTHROPLASTY;  Surgeon: Gavel Norleen, MD;  Location: [facility];  Service: Orthopedics;  Laterality: Right;   TOTAL KNEE REVISION Right 11/04/2019   Procedure: RIGHT TOTAL KNEE REVISION;  Surgeon: Gavel Norleen, MD;  Location: [facility];  Service: Orthopedics;  Laterality: Right;   TRIGGER FINGER RELEASE Right    Patient Active Problem List   Diagnosis Date Noted   Primary osteoarthritis of right hip 06/14/2023   Meralgia paraesthetica, right 06/10/2023   Spondylosis without myelopathy or radiculopathy, lumbar region 06/10/2023   Painful total knee replacement, right (HCC) 11/04/2019   Arthrofibrosis of knee joint, right 11/04/2019   S/P revision of total knee, right 11/04/2019   Primary osteoarthritis of right knee 03/31/2018   Hypertension 12/02/2011   Obesity (BMI 30-39.9) 12/02/2011    PCP: [facility]  REFERRING PROVIDER: Gavel Norleen, MD   REFERRING DIAG: S/P right hip trochanteric bursectomy and IT band release  THERAPY DIAG:  Pain in right hip  Muscle weakness (generalized)  Other abnormalities of gait and mobility  Other symptoms and signs involving the musculoskeletal system  Rationale for Evaluation and Treatment: Rehabilitation  ONSET DATE: 3  weeks ago  SUBJECTIVE:   SUBJECTIVE STATEMENT: Patient referred for aquatic therapy. Knee replacement several years ago with complications following and then hip issues. Hip replaced but continued symptoms.  PERTINENT HISTORY: Hx R THA, R TKA, hx LBP, HTN  PAIN:  Are you having pain? Yes: NPRS scale:   Pain location: R hip and knee Pain description: aching,  sharp Aggravating factors: ROM,  Relieving factors: rest  PRECAUTIONS: None  WEIGHT BEARING RESTRICTIONS: No  FALLS:  Has patient fallen in last 6 months? No  PLOF: Independent  PATIENT GOALS:  a little bit of relief  OBJECTIVE: (objective measures from initial evaluation unless otherwise dated)  PATIENT SURVEYS:  LEFS  Extreme difficulty/unable (0), Quite a bit of difficulty (1), Moderate difficulty (2), Little difficulty (3), No difficulty (4) Survey date:  07/07/24  Any of your usual work, housework or school activities 1  2. Usual hobbies, recreational or sporting activities 1  3. Getting into/out of the bath 1  4. Walking between rooms 2  5. Putting on socks/shoes 1  6. Squatting  2  7. Lifting an object, like a bag of groceries from the floor 2  8. Performing light activities around your home 2  9. Performing heavy activities around your home 0  10. Getting into/out of a car 2  11. Walking 2 blocks 0  12. Walking 1 mile 0  13. Going up/down 10 stairs (1 flight) 1  14. Standing for 1 hour 1  15.  sitting for 1 hour 2  16. Running on even ground 0  17. Running on uneven ground 0  18. Making sharp turns while running fast 0  19. Hopping  0  20. Rolling over in bed 1  Score total:  19/80     COGNITION: Overall cognitive status: Within functional limits for tasks assessed     SENSATION: WFL  POSTURE: No Significant postural limitations  PALPATION: TTP R greater Troch   LOWER EXTREMITY ROM:  Active ROM Right eval Left eval  Hip flexion    Hip extension    Hip abduction    Hip adduction    Hip internal rotation    Hip external rotation    Knee flexion    Knee extension    Ankle dorsiflexion    Ankle plantarflexion    Ankle inversion    Ankle eversion     (Blank rows = not tested) *= pain/symptoms  LOWER EXTREMITY MMT:  MMT Right eval Left eval  Hip flexion 4+ 5  Hip extension 4 4+  Hip abduction (sidelying) 4-   Hip adduction    Hip  internal rotation    Hip external rotation    Knee flexion 5 5  Knee extension 5 5  Ankle dorsiflexion    Ankle plantarflexion    Ankle inversion    Ankle eversion     (Blank rows = not tested) *= pain/symptoms  FUNCTIONAL TESTS:  5 times sit to stand: 26.97 seconds  Stairs: 7 inch, step too pattern, uses LLE  GAIT: Distance walked: 100 feet  Assistive device utilized: None Level of assistance: Complete Independence Comments: antalgic on RLE with truncal lean to R   TODAY'S TREATMENT:  DATE:  07/07/24 Bridge 2 x 10   Clam 2 x 10   PATIENT EDUCATION:  Education details: Patient educated on exam findings, POC, scope of PT, HEP, relevant anatomy and biomechanics. Person educated: Patient Education method: Explanation, Demonstration, and Handouts Education comprehension: verbalized understanding, returned demonstration, verbal cues required, and tactile cues required  HOME EXERCISE PROGRAM: Access Code: [email or web address] URL: [email or web address] Date: 07/07/2024 Prepared by: Prentice Emanuela Runnion  Exercises - Supine Bridge  - 1 x daily - 7 x weekly - 3 sets - 10 reps - Clamshell (Mirrored)  - 1 x daily - 7 x weekly - 3 sets - 10 reps  ASSESSMENT:  CLINICAL IMPRESSION: Patient a 58 y.o. y.o. male who was seen today for physical therapy evaluation and treatment for S/P right hip trochanteric bursectomy and IT band release. Patient presents with pain limited deficits in R hip strength, ROM, endurance, activity tolerance, and functional mobility with ADL. Patient is having to modify and restrict ADL as indicated by outcome measure score as well as subjective information and objective measures which is affecting overall participation. Patient will benefit from skilled physical therapy in order to improve function and reduce impairment.  OBJECTIVE  IMPAIRMENTS: Abnormal gait, decreased activity tolerance, decreased balance, decreased endurance, decreased mobility, difficulty walking, decreased ROM, decreased strength, increased muscle spasms, impaired flexibility, improper body mechanics, and pain  ACTIVITY LIMITATIONS: lifting, bending, standing, squatting, stairs, transfers, locomotion level, and caring for others  PARTICIPATION LIMITATIONS: meal prep, cleaning, laundry, shopping, community activity, occupation, and yard work  PERSONAL FACTORS: Fitness and 3+ comorbidities: Hx R THA, R TKA, hx LBP, HTN are also affecting patient's functional outcome.   REHAB POTENTIAL: Good  CLINICAL DECISION MAKING: Evolving/moderate complexity  EVALUATION COMPLEXITY: Moderate   GOALS: Goals reviewed with patient? Yes  SHORT TERM GOALS: Target date: 08/04/2024    Patient will be independent with HEP in order to improve functional outcomes. Baseline: Goal status: INITIAL  2.  Patient will report at least 25% improvement in symptoms/functional status for improved quality of life. Baseline: Goal status: INITIAL    LONG TERM GOALS: Target date: 09/01/2024    Patient will report at least 75% improvement in symptoms for improved quality of life. Baseline:  Goal status: INITIAL  2.  Patient will improve LEFS  score by at least 15 points in order to indicate improved tolerance to activity. Baseline:  Goal status: INITIAL  3.  Patient will be able to navigate stairs with reciprocal pattern without compensation in order to demonstrate improved LE strength. Baseline:  Goal status: INITIAL  4. Patient will demonstrate grade of 5/5 MMT grade in all tested musculature as evidence of improved strength to assist with stair ambulation and gait. Baseline:  Goal status: INITIAL  5.  Patient will be able to complete 5x STS in under 15 seconds in order to reduce the risk of falls. Baseline:  Goal status: INITIAL     PLAN:  PT FREQUENCY:  2x/week  PT DURATION: 8 weeks  PLANNED INTERVENTIONS: 97164- PT Re-evaluation, 97110-Therapeutic exercises, 97530- Therapeutic activity, 97112- Neuromuscular re-education, 97535- Self Care, 02859- Manual therapy, [phone]- Gait training, [phone]- Orthotic Fit/training, [phone]- Canalith repositioning, [id number]- Aquatic Therapy, [phone]- Splinting, [phone]- Wound care (first 20 sq cm), 97598- Wound care (each additional 20 sq cm)Patient/Family education, Balance training, Stair training, Taping, Dry Needling, Joint mobilization, Joint manipulation, Spinal manipulation, Spinal mobilization, Scar mobilization, and DME instructions.  PLAN FOR NEXT SESSION: aquatics and land, hip strength, functional strength   Prentice GORMAN Stains, PT,  DPT 07/07/2024, 2:32 PM

## 2024-07-11 DIAGNOSIS — Z6838 Body mass index (BMI) 38.0-38.9, adult: Secondary | ICD-10-CM | POA: Diagnosis not present

## 2024-07-11 DIAGNOSIS — I10 Essential (primary) hypertension: Secondary | ICD-10-CM | POA: Diagnosis not present

## 2024-07-13 ENCOUNTER — Ambulatory Visit (HOSPITAL_BASED_OUTPATIENT_CLINIC_OR_DEPARTMENT_OTHER)

## 2024-07-13 ENCOUNTER — Encounter (HOSPITAL_BASED_OUTPATIENT_CLINIC_OR_DEPARTMENT_OTHER): Payer: Self-pay

## 2024-07-13 DIAGNOSIS — M25551 Pain in right hip: Secondary | ICD-10-CM

## 2024-07-13 DIAGNOSIS — M6281 Muscle weakness (generalized): Secondary | ICD-10-CM

## 2024-07-13 DIAGNOSIS — R29898 Other symptoms and signs involving the musculoskeletal system: Secondary | ICD-10-CM

## 2024-07-13 DIAGNOSIS — R2689 Other abnormalities of gait and mobility: Secondary | ICD-10-CM

## 2024-07-13 NOTE — Therapy (Signed)
 OUTPATIENT PHYSICAL THERAPY LOWER EXTREMITY EVALUATION   Patient Name: Reginald [name] MRN: [id number] DOB:Aug 09, 1966, 58 y.o., male Today's Date: 07/13/2024  END OF SESSION:  PT End of Session - 07/13/24 1007     Visit Number 2    Number of Visits 16    Date for PT Re-Evaluation 09/01/24    Authorization Type [employer]    Progress Note Due on Visit 10    PT Start Time 1018    PT Stop Time 1102    PT Time Calculation (min) 44 min    Activity Tolerance Patient tolerated treatment well    Behavior During Therapy WFL for tasks assessed/performed           Past Medical History:  Diagnosis Date   Arthritis    Gallstones    Hypertension    Past Surgical History:  Procedure Laterality Date   CHOLECYSTECTOMY  12/03/2011   Procedure: LAPAROSCOPIC CHOLECYSTECTOMY WITH INTRAOPERATIVE CHOLANGIOGRAM;  Surgeon: Camellia CHRISTELLA Blush, MD;  Location: [facility];  Service: General;  Laterality: N/A;  laparoscopic cholecystectomy with intraoperative cholangiogram   CHONDROPLASTY Right 09/19/2015   Procedure: CHONDROPLASTY;  Surgeon: Norleen Gavel, MD;  Location: [facility];  Service: Orthopedics;  Laterality: Right;   ENDOVENOUS ABLATION SAPHENOUS VEIN W/ LASER Right 01/13/2019   endovenous laser ablation right greater saphenous vein and stab phlebectomy 10-20 incisions right leg by Lonni Blade MD    IR ABLATE LIVER CRYOABLATION  04/03/2020   IR RADIOLOGIST EVAL & MGMT  03/28/2020   KNEE ARTHROSCOPY WITH LATERAL MENISECTOMY Right 09/19/2015   Procedure: KNEE ARTHROSCOPY WITH PARTIAL LATERAL MENISECTOMY;  Surgeon: Norleen Gavel, MD;  Location: [facility];  Service: Orthopedics;  Laterality: Right;   KNEE ARTHROSCOPY WITH MEDIAL MENISECTOMY Right 09/19/2015   Procedure: KNEE ARTHROSCOPY WITH PARTIAL MEDIAL MENISECTOMY;  Surgeon: Norleen Gavel, MD;  Location: [facility];  Service: Orthopedics;  Laterality: Right;   ROTATOR CUFF REPAIR Left     with revision   spider bite     black widow or brown recluse   TOE FUSION Left    TOTAL HIP ARTHROPLASTY Right 06/15/2023   Procedure: TOTAL HIP ARTHROPLASTY ANTERIOR APPROACH;  Surgeon: Gavel Norleen, MD;  Location: [facility];  Service: Orthopedics;  Laterality: Right;   TOTAL KNEE ARTHROPLASTY Right 03/31/2018   Procedure: RIGHT TOTAL KNEE ARTHROPLASTY;  Surgeon: Gavel Norleen, MD;  Location: [facility];  Service: Orthopedics;  Laterality: Right;   TOTAL KNEE REVISION Right 11/04/2019   Procedure: RIGHT TOTAL KNEE REVISION;  Surgeon: Gavel Norleen, MD;  Location: [facility];  Service: Orthopedics;  Laterality: Right;   TRIGGER FINGER RELEASE Right    Patient Active Problem List   Diagnosis Date Noted   Primary osteoarthritis of right hip 06/14/2023   Meralgia paraesthetica, right 06/10/2023   Spondylosis without myelopathy or radiculopathy, lumbar region 06/10/2023   Painful total knee replacement, right (HCC) 11/04/2019   Arthrofibrosis of knee joint, right 11/04/2019   S/P revision of total knee, right 11/04/2019   Primary osteoarthritis of right knee 03/31/2018   Hypertension 12/02/2011   Obesity (BMI 30-39.9) 12/02/2011    PCP: [facility]  REFERRING PROVIDER: Gavel Norleen, MD   REFERRING DIAG: S/P right hip trochanteric bursectomy and IT band release  THERAPY DIAG:  Pain in right hip  Other abnormalities of gait and mobility  Muscle weakness (generalized)  Other symptoms and signs involving the musculoskeletal system  Rationale for Evaluation and Treatment: Rehabilitation  ONSET DATE:  3 weeks ago  SUBJECTIVE:   SUBJECTIVE STATEMENT: Pt reports high pain level (7/10) last night that kept him awake. Has been having trouble sleeping since surgery due to pain. Pain is located in posterolateral glute and can radiate down lateral thigh at times.   PERTINENT HISTORY: Hx R THA, R TKA, hx LBP, HTN  PAIN:  Are you having pain? Yes: NPRS scale: 5/10 Pain  location: R hip and knee Pain description: aching, sharp Aggravating factors: ROM,  Relieving factors: rest  PRECAUTIONS: None  WEIGHT BEARING RESTRICTIONS: No  FALLS:  Has patient fallen in last 6 months? No  PLOF: Independent  PATIENT GOALS:  a little bit of relief  OBJECTIVE: (objective measures from initial evaluation unless otherwise dated)  PATIENT SURVEYS:  LEFS  Extreme difficulty/unable (0), Quite a bit of difficulty (1), Moderate difficulty (2), Little difficulty (3), No difficulty (4) Survey date:  07/07/24  Any of your usual work, housework or school activities 1  2. Usual hobbies, recreational or sporting activities 1  3. Getting into/out of the bath 1  4. Walking between rooms 2  5. Putting on socks/shoes 1  6. Squatting  2  7. Lifting an object, like a bag of groceries from the floor 2  8. Performing light activities around your home 2  9. Performing heavy activities around your home 0  10. Getting into/out of a car 2  11. Walking 2 blocks 0  12. Walking 1 mile 0  13. Going up/down 10 stairs (1 flight) 1  14. Standing for 1 hour 1  15.  sitting for 1 hour 2  16. Running on even ground 0  17. Running on uneven ground 0  18. Making sharp turns while running fast 0  19. Hopping  0  20. Rolling over in bed 1  Score total:  19/80     COGNITION: Overall cognitive status: Within functional limits for tasks assessed     SENSATION: WFL  POSTURE: No Significant postural limitations  PALPATION: TTP R greater Troch   LOWER EXTREMITY ROM:  Active ROM Right eval Left eval  Hip flexion    Hip extension    Hip abduction    Hip adduction    Hip internal rotation    Hip external rotation    Knee flexion    Knee extension    Ankle dorsiflexion    Ankle plantarflexion    Ankle inversion    Ankle eversion     (Blank rows = not tested) *= pain/symptoms  LOWER EXTREMITY MMT:  MMT Right eval Left eval  Hip flexion 4+ 5  Hip extension 4 4+  Hip  abduction (sidelying) 4-   Hip adduction    Hip internal rotation    Hip external rotation    Knee flexion 5 5  Knee extension 5 5  Ankle dorsiflexion    Ankle plantarflexion    Ankle inversion    Ankle eversion     (Blank rows = not tested) *= pain/symptoms  FUNCTIONAL TESTS:  5 times sit to stand: 26.97 seconds  Stairs: 7 inch, step too pattern, uses LLE  GAIT: Distance walked: 100 feet  Assistive device utilized: None Level of assistance: Complete Independence Comments: antalgic on RLE with truncal lean to R   TODAY'S TREATMENT:  DATE:   8/27: PROM R hip  Roller to R [employer] set 5 hold x15 STM to R gluteal mm/TFL in s/l with pillow between knees KTC with red physioball x20 Bridge x10 (reviewed technique) Hooklying clam x10, added GTB x20 Reviewed quad/HS stretching Discussion of healing process, ice use, activity modifications, and HEP performance within pain limitations       07/07/24 Bridge 2 x 10   Clam 2 x 10   PATIENT EDUCATION:  Education details: Patient educated on exam findings, POC, scope of PT, HEP, relevant anatomy and biomechanics. Person educated: Patient Education method: Explanation, Demonstration, and Handouts Education comprehension: verbalized understanding, returned demonstration, verbal cues required, and tactile cues required  HOME EXERCISE PROGRAM: Access Code: [email or web address] URL: [email or web address] Date: 07/07/2024 Prepared by: Prentice Zaunegger  Exercises - Supine Bridge  - 1 x daily - 7 x weekly - 3 sets - 10 reps - Clamshell (Mirrored)  - 1 x daily - 7 x weekly - 3 sets - 10 reps  ASSESSMENT:  CLINICAL IMPRESSION:  Pt severely tender to distal ITB attachment and lateral HS. Pt also tender in lateral gluteal mm. Spent time on manual interventions to address tightness in quads and glutes.  Modified pressure based on pt pain response. Gentle therex performed for glute and quad activation. Pt does have hx of lumbar pain so advised to avoid pushing past pain limitations with bridges. Reviewed technique with this for increased knee flexion with improved performance. Will continue to progress as tolerated while monitoring pain level.   Eval: Patient a 58 y.o. y.o. male who was seen today for physical therapy evaluation and treatment for S/P right hip trochanteric bursectomy and IT band release. Patient presents with pain limited deficits in R hip strength, ROM, endurance, activity tolerance, and functional mobility with ADL. Patient is having to modify and restrict ADL as indicated by outcome measure score as well as subjective information and objective measures which is affecting overall participation. Patient will benefit from skilled physical therapy in order to improve function and reduce impairment.  OBJECTIVE IMPAIRMENTS: Abnormal gait, decreased activity tolerance, decreased balance, decreased endurance, decreased mobility, difficulty walking, decreased ROM, decreased strength, increased muscle spasms, impaired flexibility, improper body mechanics, and pain  ACTIVITY LIMITATIONS: lifting, bending, standing, squatting, stairs, transfers, locomotion level, and caring for others  PARTICIPATION LIMITATIONS: meal prep, cleaning, laundry, shopping, community activity, occupation, and yard work  PERSONAL FACTORS: Fitness and 3+ comorbidities: Hx R THA, R TKA, hx LBP, HTN are also affecting patient's functional outcome.   REHAB POTENTIAL: Good  CLINICAL DECISION MAKING: Evolving/moderate complexity  EVALUATION COMPLEXITY: Moderate   GOALS: Goals reviewed with patient? Yes  SHORT TERM GOALS: Target date: 08/04/2024    Patient will be independent with HEP in order to improve functional outcomes. Baseline: Goal status: INITIAL  2.  Patient will report at least 25% improvement in  symptoms/functional status for improved quality of life. Baseline: Goal status: INITIAL    LONG TERM GOALS: Target date: 09/01/2024    Patient will report at least 75% improvement in symptoms for improved quality of life. Baseline:  Goal status: INITIAL  2.  Patient will improve LEFS  score by at least 15 points in order to indicate improved tolerance to activity. Baseline:  Goal status: INITIAL  3.  Patient will be able to navigate stairs with reciprocal pattern without compensation in order to demonstrate improved LE strength. Baseline:  Goal status: INITIAL  4. Patient will demonstrate grade of 5/5 MMT grade in  all tested musculature as evidence of improved strength to assist with stair ambulation and gait. Baseline:  Goal status: INITIAL  5.  Patient will be able to complete 5x STS in under 15 seconds in order to reduce the risk of falls. Baseline:  Goal status: INITIAL     PLAN:  PT FREQUENCY: 2x/week  PT DURATION: 8 weeks  PLANNED INTERVENTIONS: 97164- PT Re-evaluation, 97110-Therapeutic exercises, 97530- Therapeutic activity, 97112- Neuromuscular re-education, 97535- Self Care, 02859- Manual therapy, [phone]- Gait training, [phone]- Orthotic Fit/training, [phone]- Canalith repositioning, [id number]- Aquatic Therapy, [phone]- Splinting, [phone]- Wound care (first 20 sq cm), 97598- Wound care (each additional 20 sq cm)Patient/Family education, Balance training, Stair training, Taping, Dry Needling, Joint mobilization, Joint manipulation, Spinal manipulation, Spinal mobilization, Scar mobilization, and DME instructions.  PLAN FOR NEXT SESSION: aquatics and land, hip strength, functional strength   Asberry BRAVO Zemirah Krasinski, PTA 07/13/2024, 11:29 AM

## 2024-07-14 DIAGNOSIS — G8929 Other chronic pain: Secondary | ICD-10-CM | POA: Diagnosis not present

## 2024-07-14 DIAGNOSIS — M069 Rheumatoid arthritis, unspecified: Secondary | ICD-10-CM | POA: Diagnosis not present

## 2024-07-14 DIAGNOSIS — M25512 Pain in left shoulder: Secondary | ICD-10-CM | POA: Diagnosis not present

## 2024-07-14 DIAGNOSIS — M25562 Pain in left knee: Secondary | ICD-10-CM | POA: Diagnosis not present

## 2024-07-14 DIAGNOSIS — I10 Essential (primary) hypertension: Secondary | ICD-10-CM | POA: Diagnosis not present

## 2024-07-14 DIAGNOSIS — M25561 Pain in right knee: Secondary | ICD-10-CM | POA: Diagnosis not present

## 2024-07-15 ENCOUNTER — Encounter (HOSPITAL_BASED_OUTPATIENT_CLINIC_OR_DEPARTMENT_OTHER): Payer: Self-pay

## 2024-07-15 ENCOUNTER — Ambulatory Visit (HOSPITAL_BASED_OUTPATIENT_CLINIC_OR_DEPARTMENT_OTHER)

## 2024-07-15 DIAGNOSIS — M6281 Muscle weakness (generalized): Secondary | ICD-10-CM

## 2024-07-15 DIAGNOSIS — R2689 Other abnormalities of gait and mobility: Secondary | ICD-10-CM

## 2024-07-15 DIAGNOSIS — M25551 Pain in right hip: Secondary | ICD-10-CM

## 2024-07-15 DIAGNOSIS — R29898 Other symptoms and signs involving the musculoskeletal system: Secondary | ICD-10-CM

## 2024-07-15 NOTE — Therapy (Signed)
 OUTPATIENT PHYSICAL THERAPY LOWER EXTREMITY TREATMENT   Patient Name: Reginald [name] MRN: [id number] DOB:[birth year], 58 y.o., male Today's Date: 07/15/2024  END OF SESSION:  PT End of Session - 07/15/24 1706     Visit Number 3    Number of Visits 16    Date for PT Re-Evaluation 09/01/24    Authorization Type [employer]    Progress Note Due on Visit 10    PT Start Time 1515    PT Stop Time 1600    PT Time Calculation (min) 45 min    Activity Tolerance Patient tolerated treatment well    Behavior During Therapy WFL for tasks assessed/performed            Past Medical History:  Diagnosis Date   Arthritis    Gallstones    Hypertension    Past Surgical History:  Procedure Laterality Date   CHOLECYSTECTOMY  12/03/2011   Procedure: LAPAROSCOPIC CHOLECYSTECTOMY WITH INTRAOPERATIVE CHOLANGIOGRAM;  Surgeon: Camellia CHRISTELLA Blush, MD;  Location: [facility] OR;  Service: General;  Laterality: N/A;  laparoscopic cholecystectomy with intraoperative cholangiogram   CHONDROPLASTY Right 09/19/2015   Procedure: CHONDROPLASTY;  Surgeon: Norleen Gavel, MD;  Location: [facility];  Service: Orthopedics;  Laterality: Right;   ENDOVENOUS ABLATION SAPHENOUS VEIN W/ LASER Right 01/13/2019   endovenous laser ablation right greater saphenous vein and stab phlebectomy 10-20 incisions right leg by Lonni Blade MD    IR ABLATE LIVER CRYOABLATION  04/03/2020   IR RADIOLOGIST EVAL & MGMT  03/28/2020   KNEE ARTHROSCOPY WITH LATERAL MENISECTOMY Right 09/19/2015   Procedure: KNEE ARTHROSCOPY WITH PARTIAL LATERAL MENISECTOMY;  Surgeon: Norleen Gavel, MD;  Location: [facility];  Service: Orthopedics;  Laterality: Right;   KNEE ARTHROSCOPY WITH MEDIAL MENISECTOMY Right 09/19/2015   Procedure: KNEE ARTHROSCOPY WITH PARTIAL MEDIAL MENISECTOMY;  Surgeon: Norleen Gavel, MD;  Location: [facility];  Service: Orthopedics;  Laterality: Right;   ROTATOR CUFF REPAIR Left     with revision   spider bite     black widow or brown recluse   TOE FUSION Left    TOTAL HIP ARTHROPLASTY Right 06/15/2023   Procedure: TOTAL HIP ARTHROPLASTY ANTERIOR APPROACH;  Surgeon: Gavel Norleen, MD;  Location: [facility];  Service: Orthopedics;  Laterality: Right;   TOTAL KNEE ARTHROPLASTY Right 03/31/2018   Procedure: RIGHT TOTAL KNEE ARTHROPLASTY;  Surgeon: Gavel Norleen, MD;  Location: [facility];  Service: Orthopedics;  Laterality: Right;   TOTAL KNEE REVISION Right 11/04/2019   Procedure: RIGHT TOTAL KNEE REVISION;  Surgeon: Gavel Norleen, MD;  Location: [facility];  Service: Orthopedics;  Laterality: Right;   TRIGGER FINGER RELEASE Right    Patient Active Problem List   Diagnosis Date Noted   Primary osteoarthritis of right hip 06/14/2023   Meralgia paraesthetica, right 06/10/2023   Spondylosis without myelopathy or radiculopathy, lumbar region 06/10/2023   Painful total knee replacement, right (HCC) 11/04/2019   Arthrofibrosis of knee joint, right 11/04/2019   S/P revision of total knee, right 11/04/2019   Primary osteoarthritis of right knee 03/31/2018   Hypertension 12/02/2011   Obesity (BMI 30-39.9) 12/02/2011    PCP: [facility]  REFERRING PROVIDER: Gavel Norleen, MD   REFERRING DIAG: S/P right hip trochanteric bursectomy and IT band release  THERAPY DIAG:  Pain in right hip  Other abnormalities of gait and mobility  Muscle weakness (generalized)  Other symptoms and signs involving the musculoskeletal system  Rationale for Evaluation and Treatment: Rehabilitation  ONSET  DATE: 3 weeks ago  SUBJECTIVE:   SUBJECTIVE STATEMENT: Pt reports 4-5/10 pain level at entry. Reports he did okay after last session. Still unable to sleep.   PERTINENT HISTORY: Hx R THA, R TKA, hx LBP, HTN  PAIN:  Are you having pain? Yes: NPRS scale: 4-5/10 Pain location: R hip and knee Pain description: aching, sharp Aggravating factors: ROM,  Relieving factors:  rest  PRECAUTIONS: None  WEIGHT BEARING RESTRICTIONS: No  FALLS:  Has patient fallen in last 6 months? No  PLOF: Independent  PATIENT GOALS:  a little bit of relief  OBJECTIVE: (objective measures from initial evaluation unless otherwise dated)  PATIENT SURVEYS:  LEFS  Extreme difficulty/unable (0), Quite a bit of difficulty (1), Moderate difficulty (2), Little difficulty (3), No difficulty (4) Survey date:  07/07/24  Any of your usual work, housework or school activities 1  2. Usual hobbies, recreational or sporting activities 1  3. Getting into/out of the bath 1  4. Walking between rooms 2  5. Putting on socks/shoes 1  6. Squatting  2  7. Lifting an object, like a bag of groceries from the floor 2  8. Performing light activities around your home 2  9. Performing heavy activities around your home 0  10. Getting into/out of a car 2  11. Walking 2 blocks 0  12. Walking 1 mile 0  13. Going up/down 10 stairs (1 flight) 1  14. Standing for 1 hour 1  15.  sitting for 1 hour 2  16. Running on even ground 0  17. Running on uneven ground 0  18. Making sharp turns while running fast 0  19. Hopping  0  20. Rolling over in bed 1  Score total:  19/80     COGNITION: Overall cognitive status: Within functional limits for tasks assessed     SENSATION: WFL  POSTURE: No Significant postural limitations  PALPATION: TTP R greater Troch   LOWER EXTREMITY ROM:  Active ROM Right eval Left eval  Hip flexion    Hip extension    Hip abduction    Hip adduction    Hip internal rotation    Hip external rotation    Knee flexion    Knee extension    Ankle dorsiflexion    Ankle plantarflexion    Ankle inversion    Ankle eversion     (Blank rows = not tested) *= pain/symptoms  LOWER EXTREMITY MMT:  MMT Right eval Left eval  Hip flexion 4+ 5  Hip extension 4 4+  Hip abduction (sidelying) 4-   Hip adduction    Hip internal rotation    Hip external rotation    Knee  flexion 5 5  Knee extension 5 5  Ankle dorsiflexion    Ankle plantarflexion    Ankle inversion    Ankle eversion     (Blank rows = not tested) *= pain/symptoms  FUNCTIONAL TESTS:  5 times sit to stand: 26.97 seconds  Stairs: 7 inch, step too pattern, uses LLE  GAIT: Distance walked: 100 feet  Assistive device utilized: None Level of assistance: Complete Independence Comments: antalgic on RLE with truncal lean to R   TODAY'S TREATMENT:  DATE:    8/29 PROM R hip  Roller to R quads STM to R TFL and lateral glute  Quad set 5 hold x15 STM to R gluteal mm/TFL in s/l with pillow between knees Bridge 2x10 S/l clam 2x10 LAQ 2x10  8/27: PROM R hip  Roller to R [employer] 5 hold x15 STM to R gluteal mm/TFL in s/l with pillow between knees KTC with red physioball x20 Bridge x10 (reviewed technique) Hooklying clam x10, added GTB x20 Reviewed quad/HS stretching Discussion of healing process, ice use, activity modifications, and HEP performance within pain limitations       07/07/24 Bridge 2 x 10   Clam 2 x 10   PATIENT EDUCATION:  Education details: Patient educated on exam findings, POC, scope of PT, HEP, relevant anatomy and biomechanics. Person educated: Patient Education method: Explanation, Demonstration, and Handouts Education comprehension: verbalized understanding, returned demonstration, verbal cues required, and tactile cues required  HOME EXERCISE PROGRAM: Access Code: [email or web address] URL: [email or web address] Date: 07/07/2024 Prepared by: Prentice Zaunegger  Exercises - Supine Bridge  - 1 x daily - 7 x weekly - 3 sets - 10 reps - Clamshell (Mirrored)  - 1 x daily - 7 x weekly - 3 sets - 10 reps  ASSESSMENT:  CLINICAL IMPRESSION:  Good tolerance for R hip PROM all planes. Continued with MT for quad and ITB.  Hypersensitive and tender to palpation of distal and proximal ITB. Pt c/o pain in lateral quad, which has been consistent since last knee surgery. Able to tolerate hip and knee strengthening with cues to avoid pushing past pain limitations. Will continue to progress as tolerated.   Eval: Patient a 58 y.o. y.o. male who was seen today for physical therapy evaluation and treatment for S/P right hip trochanteric bursectomy and IT band release. Patient presents with pain limited deficits in R hip strength, ROM, endurance, activity tolerance, and functional mobility with ADL. Patient is having to modify and restrict ADL as indicated by outcome measure score as well as subjective information and objective measures which is affecting overall participation. Patient will benefit from skilled physical therapy in order to improve function and reduce impairment.  OBJECTIVE IMPAIRMENTS: Abnormal gait, decreased activity tolerance, decreased balance, decreased endurance, decreased mobility, difficulty walking, decreased ROM, decreased strength, increased muscle spasms, impaired flexibility, improper body mechanics, and pain  ACTIVITY LIMITATIONS: lifting, bending, standing, squatting, stairs, transfers, locomotion level, and caring for others  PARTICIPATION LIMITATIONS: meal prep, cleaning, laundry, shopping, community activity, occupation, and yard work  PERSONAL FACTORS: Fitness and 3+ comorbidities: Hx R THA, R TKA, hx LBP, HTN are also affecting patient's functional outcome.   REHAB POTENTIAL: Good  CLINICAL DECISION MAKING: Evolving/moderate complexity  EVALUATION COMPLEXITY: Moderate   GOALS: Goals reviewed with patient? Yes  SHORT TERM GOALS: Target date: 08/04/2024    Patient will be independent with HEP in order to improve functional outcomes. Baseline: Goal status: INITIAL  2.  Patient will report at least 25% improvement in symptoms/functional status for improved quality of  life. Baseline: Goal status: INITIAL    LONG TERM GOALS: Target date: 09/01/2024    Patient will report at least 75% improvement in symptoms for improved quality of life. Baseline:  Goal status: INITIAL  2.  Patient will improve LEFS  score by at least 15 points in order to indicate improved tolerance to activity. Baseline:  Goal status: INITIAL  3.  Patient will be able to navigate stairs with reciprocal pattern without compensation in order to  demonstrate improved LE strength. Baseline:  Goal status: INITIAL  4. Patient will demonstrate grade of 5/5 MMT grade in all tested musculature as evidence of improved strength to assist with stair ambulation and gait. Baseline:  Goal status: INITIAL  5.  Patient will be able to complete 5x STS in under 15 seconds in order to reduce the risk of falls. Baseline:  Goal status: INITIAL     PLAN:  PT FREQUENCY: 2x/week  PT DURATION: 8 weeks  PLANNED INTERVENTIONS: 97164- PT Re-evaluation, 97110-Therapeutic exercises, 97530- Therapeutic activity, 97112- Neuromuscular re-education, 97535- Self Care, [id number]- Manual therapy, [phone]- Gait training, [phone]- Orthotic Fit/training, [phone]- Canalith repositioning, [id number]- Aquatic Therapy, [phone]- Splinting, [phone]- Wound care (first 20 sq cm), 97598- Wound care (each additional 20 sq cm)Patient/Family education, Balance training, Stair training, Taping, Dry Needling, Joint mobilization, Joint manipulation, Spinal manipulation, Spinal mobilization, Scar mobilization, and DME instructions.  PLAN FOR NEXT SESSION: aquatics and land, hip strength, functional strength   Asberry BRAVO Sofi Bryars, PTA 07/15/2024, 5:12 PM

## 2024-07-20 ENCOUNTER — Ambulatory Visit (HOSPITAL_BASED_OUTPATIENT_CLINIC_OR_DEPARTMENT_OTHER): Attending: Orthopedic Surgery | Admitting: Physical Therapy

## 2024-07-20 ENCOUNTER — Encounter (HOSPITAL_BASED_OUTPATIENT_CLINIC_OR_DEPARTMENT_OTHER): Payer: Self-pay | Admitting: Physical Therapy

## 2024-07-20 DIAGNOSIS — M6281 Muscle weakness (generalized): Secondary | ICD-10-CM | POA: Insufficient documentation

## 2024-07-20 DIAGNOSIS — R2689 Other abnormalities of gait and mobility: Secondary | ICD-10-CM | POA: Diagnosis not present

## 2024-07-20 DIAGNOSIS — M25551 Pain in right hip: Secondary | ICD-10-CM | POA: Diagnosis not present

## 2024-07-20 DIAGNOSIS — R29898 Other symptoms and signs involving the musculoskeletal system: Secondary | ICD-10-CM | POA: Insufficient documentation

## 2024-07-20 NOTE — Therapy (Signed)
 OUTPATIENT PHYSICAL THERAPY LOWER EXTREMITY TREATMENT   Patient Name: Reginald [name] MRN: [id number] DOB:November 20, 1965, 58 y.o., male Today's Date: 07/20/2024  END OF SESSION:  PT End of Session - 07/20/24 1149     Visit Number 4    Number of Visits 16    Date for PT Re-Evaluation 09/01/24    Authorization Type [employer]    Progress Note Due on Visit 10    PT Start Time 1148    PT Stop Time 1226    PT Time Calculation (min) 38 min    Activity Tolerance Patient tolerated treatment well    Behavior During Therapy WFL for tasks assessed/performed            Past Medical History:  Diagnosis Date   Arthritis    Gallstones    Hypertension    Past Surgical History:  Procedure Laterality Date   CHOLECYSTECTOMY  12/03/2011   Procedure: LAPAROSCOPIC CHOLECYSTECTOMY WITH INTRAOPERATIVE CHOLANGIOGRAM;  Surgeon: Camellia CHRISTELLA Blush, MD;  Location: [facility];  Service: General;  Laterality: N/A;  laparoscopic cholecystectomy with intraoperative cholangiogram   CHONDROPLASTY Right 09/19/2015   Procedure: CHONDROPLASTY;  Surgeon: Norleen Gavel, MD;  Location: [facility];  Service: Orthopedics;  Laterality: Right;   ENDOVENOUS ABLATION SAPHENOUS VEIN W/ LASER Right 01/13/2019   endovenous laser ablation right greater saphenous vein and stab phlebectomy 10-20 incisions right leg by Lonni Blade MD    IR ABLATE LIVER CRYOABLATION  04/03/2020   IR RADIOLOGIST EVAL & MGMT  03/28/2020   KNEE ARTHROSCOPY WITH LATERAL MENISECTOMY Right 09/19/2015   Procedure: KNEE ARTHROSCOPY WITH PARTIAL LATERAL MENISECTOMY;  Surgeon: Norleen Gavel, MD;  Location: [facility];  Service: Orthopedics;  Laterality: Right;   KNEE ARTHROSCOPY WITH MEDIAL MENISECTOMY Right 09/19/2015   Procedure: KNEE ARTHROSCOPY WITH PARTIAL MEDIAL MENISECTOMY;  Surgeon: Norleen Gavel, MD;  Location: [facility];  Service: Orthopedics;  Laterality: Right;   ROTATOR CUFF REPAIR Left     with revision   spider bite     black widow or brown recluse   TOE FUSION Left    TOTAL HIP ARTHROPLASTY Right 06/15/2023   Procedure: TOTAL HIP ARTHROPLASTY ANTERIOR APPROACH;  Surgeon: Gavel Norleen, MD;  Location: [facility];  Service: Orthopedics;  Laterality: Right;   TOTAL KNEE ARTHROPLASTY Right 03/31/2018   Procedure: RIGHT TOTAL KNEE ARTHROPLASTY;  Surgeon: Gavel Norleen, MD;  Location: [facility];  Service: Orthopedics;  Laterality: Right;   TOTAL KNEE REVISION Right 11/04/2019   Procedure: RIGHT TOTAL KNEE REVISION;  Surgeon: Gavel Norleen, MD;  Location: [facility];  Service: Orthopedics;  Laterality: Right;   TRIGGER FINGER RELEASE Right    Patient Active Problem List   Diagnosis Date Noted   Primary osteoarthritis of right hip 06/14/2023   Meralgia paraesthetica, right 06/10/2023   Spondylosis without myelopathy or radiculopathy, lumbar region 06/10/2023   Painful total knee replacement, right (HCC) 11/04/2019   Arthrofibrosis of knee joint, right 11/04/2019   S/P revision of total knee, right 11/04/2019   Primary osteoarthritis of right knee 03/31/2018   Hypertension 12/02/2011   Obesity (BMI 30-39.9) 12/02/2011    PCP: [facility]  REFERRING PROVIDER: Gavel Norleen, MD   REFERRING DIAG: S/P right hip trochanteric bursectomy and IT band release  THERAPY DIAG:  Pain in right hip  Other abnormalities of gait and mobility  Muscle weakness (generalized)  Other symptoms and signs involving the musculoskeletal system  Rationale for Evaluation and Treatment: Rehabilitation  ONSET  DATE: 3 weeks ago  SUBJECTIVE:   SUBJECTIVE STATEMENT: Pt reports no change in pain level.  Pt currently walks in [place] pool a few times/week and is interested in learning exercises to complete in water  on own.   POOL ACCESS: member of [employer]    PERTINENT HISTORY: Hx R THA, R TKA, hx LBP, HTN  PAIN:  Are you having pain? Yes: NPRS scale: 5/10 Pain location: R hip and  Rt knee Pain description: aching, sharp Aggravating factors: ROM,  Relieving factors: rest  PRECAUTIONS: None  WEIGHT BEARING RESTRICTIONS: No  FALLS:  Has patient fallen in last 6 months? No  PLOF: Independent  PATIENT GOALS:  a little bit of relief  OBJECTIVE: (objective measures from initial evaluation unless otherwise dated)  PATIENT SURVEYS:  LEFS  Extreme difficulty/unable (0), Quite a bit of difficulty (1), Moderate difficulty (2), Little difficulty (3), No difficulty (4) Survey date:  07/07/24  Any of your usual work, housework or school activities 1  2. Usual hobbies, recreational or sporting activities 1  3. Getting into/out of the bath 1  4. Walking between rooms 2  5. Putting on socks/shoes 1  6. Squatting  2  7. Lifting an object, like a bag of groceries from the floor 2  8. Performing light activities around your home 2  9. Performing heavy activities around your home 0  10. Getting into/out of a car 2  11. Walking 2 blocks 0  12. Walking 1 mile 0  13. Going up/down 10 stairs (1 flight) 1  14. Standing for 1 hour 1  15.  sitting for 1 hour 2  16. Running on even ground 0  17. Running on uneven ground 0  18. Making sharp turns while running fast 0  19. Hopping  0  20. Rolling over in bed 1  Score total:  19/80     COGNITION: Overall cognitive status: Within functional limits for tasks assessed     SENSATION: WFL  POSTURE: No Significant postural limitations  PALPATION: TTP R greater Troch   LOWER EXTREMITY ROM:  Active ROM Right eval Left eval  Hip flexion    Hip extension    Hip abduction    Hip adduction    Hip internal rotation    Hip external rotation    Knee flexion    Knee extension    Ankle dorsiflexion    Ankle plantarflexion    Ankle inversion    Ankle eversion     (Blank rows = not tested) *= pain/symptoms  LOWER EXTREMITY MMT:  MMT Right eval Left eval  Hip flexion 4+ 5  Hip extension 4 4+  Hip abduction  (sidelying) 4-   Hip adduction    Hip internal rotation    Hip external rotation    Knee flexion 5 5  Knee extension 5 5  Ankle dorsiflexion    Ankle plantarflexion    Ankle inversion    Ankle eversion     (Blank rows = not tested) *= pain/symptoms  FUNCTIONAL TESTS:  5 times sit to stand: 26.97 seconds  Stairs: 7 inch, step too pattern, uses LLE  GAIT: Distance walked: 100 feet  Assistive device utilized: None Level of assistance: Complete Independence Comments: antalgic on RLE with truncal lean to R   TODAY'S TREATMENT:  [facility] Adult PT Treatment:                                             Date: 07/20/24 Pt seen for aquatic therapy today.  Treatment took place in water  3.5-4.75 ft in depth at the [facility] pool. Temp of water  was 91.  Pt entered/exited the pool via stairs independently in step-to pattern with bil rail.  - Intro to aquatic therapy principles - unsupported walking forward/ backward (feels slightly better facing lap pool) - unsupported side stepping  - UE on wall:  single leg clams x 5 each (pain in Rt hip at end range ER); hip abdct/add crossing midline x 10 each ; hip flexion /extension x 10 each; relaxed squat - Rt quad stretch with ankle supported by hollow noodle x 30s x 2-> moving into hip ext in between reps - straddling noodle and one under arms behind back: cycling; hip abdct/add; cross country ski-all at slow pace  - at bench on deck: IASTM to Rt distal to mid quad to decrease fascial restrictions and improve mobility in RLE  Pt requires the buoyancy and hydrostatic pressure of water  for support, and to offload joints by unweighting joint load by at least 50 % in navel deep water  and by at least 75-80% in chest to neck deep water .  Viscosity of the water  is needed for resistance of strengthening. Water  current  perturbations provides challenge to standing balance requiring increased core activation.     8/29 PROM R hip  Roller to R quads STM to R TFL and lateral glute  Quad set 5 hold x15 STM to R gluteal mm/TFL in s/l with pillow between knees Bridge 2x10 S/l clam 2x10 LAQ 2x10  8/27: PROM R hip  Roller to [employer] 5 hold x15 STM to R gluteal mm/TFL in s/l with pillow between knees KTC with red physioball x20 Bridge x10 (reviewed technique) Hooklying clam x10, added GTB x20 Reviewed quad/HS stretching Discussion of healing process, ice use, activity modifications, and HEP performance within pain limitations       07/07/24 Bridge 2 x 10   Clam 2 x 10   PATIENT EDUCATION:  Education details: intro to aquatic therapy  Person educated: Patient Education method: Programmer, multimedia, Facilities manager,  Education comprehension: verbalized understanding, returned demonstration, verbal cues required, and tactile cues required  HOME EXERCISE PROGRAM: Access Code: [email or web address] URL: [email or web address] Date: 07/07/2024 Prepared by: Prentice Zaunegger  Exercises - Supine Bridge  - 1 x daily - 7 x weekly - 3 sets - 10 reps - Clamshell (Mirrored)  - 1 x daily - 7 x weekly - 3 sets - 10 reps  ASSESSMENT:  CLINICAL IMPRESSION: Pt demonstrates safety and independence in aquatic setting with therapist instructing from deck. Pt demonstrates confidence in setting, moving throughout all depths easily.  Pt is directed through various movement patterns and trials in standing position. Pt did not tolerate single leg clam on Rt; increased pain in buttock at end range.  Pain in knee remains unchanged during session.  Pt reported only slightly reduced pain in Rt knee after IASTM to Rt quad towards end of session.  Goals are ongoing.  Plan to trial Rt step ups/ Lt step downs while submerged, more LE stretches with assistance of noodle, and tape to Rt hip at area of most pain - next session  at pool.   Will continue to progress as tolerated.   Eval: Patient a 58 y.o. y.o. male who was seen today for physical therapy evaluation and treatment for S/P right hip trochanteric bursectomy and IT band release. Patient presents with pain limited deficits in R hip strength, ROM, endurance, activity tolerance, and functional mobility with ADL. Patient is having to modify and restrict ADL as indicated by outcome measure score as well as subjective information and objective measures which is affecting overall participation. Patient will benefit from skilled physical therapy in order to improve function and reduce impairment.  OBJECTIVE IMPAIRMENTS: Abnormal gait, decreased activity tolerance, decreased balance, decreased endurance, decreased mobility, difficulty walking, decreased ROM, decreased strength, increased muscle spasms, impaired flexibility, improper body mechanics, and pain  ACTIVITY LIMITATIONS: lifting, bending, standing, squatting, stairs, transfers, locomotion level, and caring for others  PARTICIPATION LIMITATIONS: meal prep, cleaning, laundry, shopping, community activity, occupation, and yard work  PERSONAL FACTORS: Fitness and 3+ comorbidities: Hx R THA, R TKA, hx LBP, HTN are also affecting patient's functional outcome.   REHAB POTENTIAL: Good  CLINICAL DECISION MAKING: Evolving/moderate complexity  EVALUATION COMPLEXITY: Moderate   GOALS: Goals reviewed with patient? Yes  SHORT TERM GOALS: Target date: 08/04/2024    Patient will be independent with HEP in order to improve functional outcomes. Baseline: Goal status: INITIAL  2.  Patient will report at least 25% improvement in symptoms/functional status for improved quality of life. Baseline: Goal status: INITIAL    LONG TERM GOALS: Target date: 09/01/2024    Patient will report at least 75% improvement in symptoms for improved quality of life. Baseline:  Goal status: INITIAL  2.  Patient will improve  LEFS  score by at least 15 points in order to indicate improved tolerance to activity. Baseline:  Goal status: INITIAL  3.  Patient will be able to navigate stairs with reciprocal pattern without compensation in order to demonstrate improved LE strength. Baseline:  Goal status: INITIAL  4. Patient will demonstrate grade of 5/5 MMT grade in all tested musculature as evidence of improved strength to assist with stair ambulation and gait. Baseline:  Goal status: INITIAL  5.  Patient will be able to complete 5x STS in under 15 seconds in order to reduce the risk of falls. Baseline:  Goal status: INITIAL     PLAN:  PT FREQUENCY: 2x/week  PT DURATION: 8 weeks  PLANNED INTERVENTIONS: 97164- PT Re-evaluation, 97110-Therapeutic exercises, 97530- Therapeutic activity, 97112- Neuromuscular re-education, 97535- Self Care, 02859- Manual therapy, [phone]- Gait training, [phone]- Orthotic Fit/training, [phone]- Canalith repositioning, [id number]- Aquatic Therapy, [phone]- Splinting, [phone]- Wound care (first 20 sq cm), 97598- Wound care (each additional 20 sq cm)Patient/Family education, Balance training, Stair training, Taping, Dry Needling, Joint mobilization, Joint manipulation, Spinal manipulation, Spinal mobilization, Scar mobilization, and DME instructions.  PLAN FOR NEXT SESSION: aquatics and land, hip strength, functional strength   Delon Aquas, PTA 07/20/24 12:48 PM [name] [address] [place], [place], [address] Phone: [phone]   Fax:  [phone]

## 2024-07-22 ENCOUNTER — Ambulatory Visit (INDEPENDENT_AMBULATORY_CARE_PROVIDER_SITE_OTHER)

## 2024-07-22 ENCOUNTER — Ambulatory Visit (INDEPENDENT_AMBULATORY_CARE_PROVIDER_SITE_OTHER): Admitting: Orthopaedic Surgery

## 2024-07-22 ENCOUNTER — Telehealth (HOSPITAL_BASED_OUTPATIENT_CLINIC_OR_DEPARTMENT_OTHER): Payer: Self-pay | Admitting: Orthopaedic Surgery

## 2024-07-22 ENCOUNTER — Other Ambulatory Visit (HOSPITAL_BASED_OUTPATIENT_CLINIC_OR_DEPARTMENT_OTHER): Payer: Self-pay

## 2024-07-22 DIAGNOSIS — M1611 Unilateral primary osteoarthritis, right hip: Secondary | ICD-10-CM

## 2024-07-22 DIAGNOSIS — M25551 Pain in right hip: Secondary | ICD-10-CM | POA: Diagnosis not present

## 2024-07-22 DIAGNOSIS — Z96641 Presence of right artificial hip joint: Secondary | ICD-10-CM | POA: Diagnosis not present

## 2024-07-22 MED ORDER — LIDOCAINE HCL 1 % IJ SOLN
4.0000 mL | INTRAMUSCULAR | Status: AC | PRN
  Administered 2024-07-22: 4 mL

## 2024-07-22 MED ORDER — TRIAMCINOLONE ACETONIDE 40 MG/ML IJ SUSP
80.0000 mg | INTRAMUSCULAR | Status: AC | PRN
  Administered 2024-07-22: 80 mg via INTRA_ARTICULAR

## 2024-07-22 NOTE — Progress Notes (Signed)
 Chief Complaint: Right hip pain     History of Present Illness:    Reginald [name] is a 58 y.o. male presents with ongoing right hip pain.  He is status post right total hip arthroplasty in 2024 with Dr. Yvone as well as a right IT band release 6 weeks prior.  He has been experiencing pain in the groin as well as the lateral trochanter since the hip replacement.  He is on oxycodone  at baseline.  He experiences the pain predominantly deep in the groin with any activity.    PMH/PSH/Family History/Social History/Meds/Allergies:    Past Medical History:  Diagnosis Date  . Arthritis   . Gallstones   . Hypertension    Past Surgical History:  Procedure Laterality Date  . CHOLECYSTECTOMY  12/03/2011   Procedure: LAPAROSCOPIC CHOLECYSTECTOMY WITH INTRAOPERATIVE CHOLANGIOGRAM;  Surgeon: Camellia CHRISTELLA Blush, MD;  Location: [facility] OR;  Service: General;  Laterality: N/A;  laparoscopic cholecystectomy with intraoperative cholangiogram  . CHONDROPLASTY Right 09/19/2015   Procedure: CHONDROPLASTY;  Surgeon: [name], MD;  Location: [facility];  Service: Orthopedics;  Laterality: Right;  . ENDOVENOUS ABLATION SAPHENOUS VEIN W/ LASER Right 01/13/2019   endovenous laser ablation right greater saphenous vein and stab phlebectomy 10-20 incisions right leg by Lonni Blade MD   . IR ABLATE LIVER CRYOABLATION  04/03/2020  . IR RADIOLOGIST EVAL & MGMT  03/28/2020  . KNEE ARTHROSCOPY WITH LATERAL MENISECTOMY Right 09/19/2015   Procedure: KNEE ARTHROSCOPY WITH PARTIAL LATERAL MENISECTOMY;  Surgeon: [name], MD;  Location: [facility];  Service: Orthopedics;  Laterality: Right;  . KNEE ARTHROSCOPY WITH MEDIAL MENISECTOMY Right 09/19/2015   Procedure: KNEE ARTHROSCOPY WITH PARTIAL MEDIAL MENISECTOMY;  Surgeon: [name], MD;  Location: [facility];  Service: Orthopedics;  Laterality: Right;  . ROTATOR CUFF REPAIR Left    with revision  . spider bite      black widow or brown recluse  . TOE FUSION Left   . TOTAL HIP ARTHROPLASTY Right 06/15/2023   Procedure: TOTAL HIP ARTHROPLASTY ANTERIOR APPROACH;  Surgeon: [name], MD;  Location: [facility];  Service: Orthopedics;  Laterality: Right;  . TOTAL KNEE ARTHROPLASTY Right 03/31/2018   Procedure: RIGHT TOTAL KNEE ARTHROPLASTY;  Surgeon: [name], MD;  Location: [facility];  Service: Orthopedics;  Laterality: Right;  . TOTAL KNEE REVISION Right 11/04/2019   Procedure: RIGHT TOTAL KNEE REVISION;  Surgeon: [name], MD;  Location: [facility];  Service: Orthopedics;  Laterality: Right;  . TRIGGER FINGER RELEASE Right    Social History   Socioeconomic History  . Marital status: Married    Spouse name: Not on file  . Number of children: Not on file  . Years of education: Not on file  . Highest education level: Not on file  Occupational History  . Not on file  Tobacco Use  . Smoking status: Never  . Smokeless tobacco: Never  Vaping Use  . Vaping status: Never Used  Substance and Sexual Activity  . Alcohol use: Yes    Alcohol/week: 1.0 standard drink of alcohol    Types: 1 Cans of beer per week    Comment: occa.  . Drug use: No  . Sexual activity: Not Currently  Other Topics Concern  . Not on file  Social History Narrative  . Not on file   Social Drivers of Health   Financial Resource Strain: Not on file  Food Insecurity: Not on file  Transportation Needs: Not on  file  Physical Activity: Not on file  Stress: Not on file  Social Connections: Unknown (03/31/2022)   [facility]   Social Network   . Social Network: Not on file   Family History  Problem Relation Age of Onset  . Cancer Mother        breast  . Heart disease Mother    Allergies  Allergen Reactions  . Gabapentin      Head cloudy   . Ibuprofen Nausea And Vomiting    High doses make him vomit.   Current Outpatient Medications  Medication Sig Dispense Refill  . aspirin  EC 325 MG tablet Take 1  tablet (325 mg total) by mouth 2 (two) times daily after a meal. Take x 1 month post op to decrease risk of blood clots. 60 tablet 0  . docusate sodium  (COLACE) 100 MG capsule Take 1 capsule (100 mg total) by mouth 2 (two) times daily. 30 capsule 0  . folic acid (FOLVITE) 1 MG tablet Take 1 mg by mouth daily.    . HYDROcodone -acetaminophen  (NORCO) 10-325 MG tablet Take 1 tablet by mouth 3 (three) times daily.    . losartan  (COZAAR ) 100 MG tablet Take 100 mg by mouth daily.    . Menthol, Topical Analgesic, (BIOFREEZE EX) Apply 1 Application topically daily as needed (pain).    . oxyCODONE -acetaminophen  (PERCOCET/ROXICET) 5-325 MG tablet Take 1-2 tablets by mouth every 6 (six) hours as needed for severe pain. 30 tablet 0  . tiZANidine  (ZANAFLEX ) 2 MG tablet Take 1 tablet (2 mg total) by mouth every 8 (eight) hours as needed for muscle spasms. 40 tablet 0  . VITAMIN D PO Take 1 capsule by mouth daily.     No current facility-administered medications for this visit.   No results found.  Review of Systems:   A ROS was performed including pertinent positives and negatives as documented in the HPI.  Physical Exam :   Constitutional: NAD and appears stated age Neurological: Alert and oriented Psych: Appropriate affect and cooperative There were no vitals taken for this visit.   Comprehensive Musculoskeletal Exam:    Tenderness about the femoral acetabular joint particular with a resisted extension position.  There is also tenderness about the lateral incision of his IT band.  Remainder of distal neurosensory exam is intact walks with a normal antalgic gait.  Range of motion is 30 degrees internal/external rotation of the right hip with good abduction strength   Imaging:   Xray (3 views right hip): Status post right total hip arthroplasty without evidence of complication     I personally reviewed and interpreted the radiographs.   Assessment and Plan:   58 y.o. male with evidence of  right hip iliopsoas tendinitis following total hip arthroplasty.  In order to diagnose this I have recommended ultrasound-guided injection into the iliopsoas tendon.  We did discuss that he may ultimately be a candidate for arthroscopic release of this.  At this time he is still concerned about the right hip and is still having symptoms.  I did discuss that it is possible there may be an underlying gluteus medius tear ultimately if he is not going to undergo iliopsoas release that we would potentially want to intervene on any type of gluteus medius tear at that time.  Will plan for an MRI right hip and follow-up discuss results  -Right psoas injection  Verbal consent obtained    Procedure Note  Patient: Reginald [name]  Date of Birth: Aug 21, 1966           MRN: [id number]             Visit Date: 07/22/2024  Procedures: Visit Diagnoses:  1. Primary osteoarthritis of right hip   2. Presence of right artificial hip joint     Large Joint Inj: R hip joint on 07/22/2024 12:00 PM Indications: pain Details: 22 G 3.5 in needle, ultrasound-guided anterolateral approach  Arthrogram: No  Medications: 4 mL lidocaine  1 %; 80 mg triamcinolone  acetonide 40 MG/ML Outcome: tolerated well, no immediate complications Procedure, treatment alternatives, risks and benefits explained, specific risks discussed. Consent was given by the patient. Immediately prior to procedure a time out was called to verify the correct patient, procedure, equipment, support staff and site/side marked as required. Patient was prepped and draped in the usual sterile fashion.         I personally saw and evaluated the patient, and participated in the management and treatment plan.  Elspeth Parker, MD Attending Physician, Orthopedic Surgery  This document was dictated using Dragon voice recognition software. A reasonable attempt at proof reading has been made to minimize errors.

## 2024-07-22 NOTE — Telephone Encounter (Signed)
 Patient needs meds for his MRI

## 2024-07-23 ENCOUNTER — Encounter (HOSPITAL_BASED_OUTPATIENT_CLINIC_OR_DEPARTMENT_OTHER): Payer: Self-pay | Admitting: Physical Therapy

## 2024-07-23 ENCOUNTER — Ambulatory Visit (HOSPITAL_BASED_OUTPATIENT_CLINIC_OR_DEPARTMENT_OTHER): Admitting: Physical Therapy

## 2024-07-23 DIAGNOSIS — M25551 Pain in right hip: Secondary | ICD-10-CM | POA: Diagnosis not present

## 2024-07-23 DIAGNOSIS — R29898 Other symptoms and signs involving the musculoskeletal system: Secondary | ICD-10-CM

## 2024-07-23 DIAGNOSIS — M6281 Muscle weakness (generalized): Secondary | ICD-10-CM

## 2024-07-23 DIAGNOSIS — R2689 Other abnormalities of gait and mobility: Secondary | ICD-10-CM

## 2024-07-23 NOTE — Therapy (Signed)
 OUTPATIENT PHYSICAL THERAPY LOWER EXTREMITY TREATMENT   Patient Name: Reginald [name] MRN: [id number] DOB:Aug 02, 1966, 58 y.o., male Today's Date: 07/23/2024  END OF SESSION:  PT End of Session - 07/23/24 1112     Visit Number 5    Number of Visits 16    Date for PT Re-Evaluation 09/01/24    Authorization Type [employer]    Progress Note Due on Visit 10    PT Start Time 0832    PT Stop Time 0915    PT Time Calculation (min) 43 min             Past Medical History:  Diagnosis Date   Arthritis    Gallstones    Hypertension    Past Surgical History:  Procedure Laterality Date   CHOLECYSTECTOMY  12/03/2011   Procedure: LAPAROSCOPIC CHOLECYSTECTOMY WITH INTRAOPERATIVE CHOLANGIOGRAM;  Surgeon: Camellia CHRISTELLA Blush, MD;  Location: [facility] OR;  Service: General;  Laterality: N/A;  laparoscopic cholecystectomy with intraoperative cholangiogram   CHONDROPLASTY Right 09/19/2015   Procedure: CHONDROPLASTY;  Surgeon: Norleen Gavel, MD;  Location: [facility];  Service: Orthopedics;  Laterality: Right;   ENDOVENOUS ABLATION SAPHENOUS VEIN W/ LASER Right 01/13/2019   endovenous laser ablation right greater saphenous vein and stab phlebectomy 10-20 incisions right leg by Lonni Blade MD    IR ABLATE LIVER CRYOABLATION  04/03/2020   IR RADIOLOGIST EVAL & MGMT  03/28/2020   KNEE ARTHROSCOPY WITH LATERAL MENISECTOMY Right 09/19/2015   Procedure: KNEE ARTHROSCOPY WITH PARTIAL LATERAL MENISECTOMY;  Surgeon: Norleen Gavel, MD;  Location: [facility];  Service: Orthopedics;  Laterality: Right;   KNEE ARTHROSCOPY WITH MEDIAL MENISECTOMY Right 09/19/2015   Procedure: KNEE ARTHROSCOPY WITH PARTIAL MEDIAL MENISECTOMY;  Surgeon: Norleen Gavel, MD;  Location: [facility];  Service: Orthopedics;  Laterality: Right;   ROTATOR CUFF REPAIR Left    with revision   spider bite     black widow or brown recluse   TOE FUSION Left    TOTAL HIP ARTHROPLASTY Right  06/15/2023   Procedure: TOTAL HIP ARTHROPLASTY ANTERIOR APPROACH;  Surgeon: Gavel Norleen, MD;  Location: [facility];  Service: Orthopedics;  Laterality: Right;   TOTAL KNEE ARTHROPLASTY Right 03/31/2018   Procedure: RIGHT TOTAL KNEE ARTHROPLASTY;  Surgeon: Gavel Norleen, MD;  Location: [facility];  Service: Orthopedics;  Laterality: Right;   TOTAL KNEE REVISION Right 11/04/2019   Procedure: RIGHT TOTAL KNEE REVISION;  Surgeon: Gavel Norleen, MD;  Location: [facility];  Service: Orthopedics;  Laterality: Right;   TRIGGER FINGER RELEASE Right    Patient Active Problem List   Diagnosis Date Noted   Primary osteoarthritis of right hip 06/14/2023   Meralgia paraesthetica, right 06/10/2023   Spondylosis without myelopathy or radiculopathy, lumbar region 06/10/2023   Painful total knee replacement, right (HCC) 11/04/2019   Arthrofibrosis of knee joint, right 11/04/2019   S/P revision of total knee, right 11/04/2019   Primary osteoarthritis of right knee 03/31/2018   Hypertension 12/02/2011   Obesity (BMI 30-39.9) 12/02/2011    PCP: [facility]  REFERRING PROVIDER: Gavel Norleen, MD   REFERRING DIAG: S/P right hip trochanteric bursectomy and IT band release  THERAPY DIAG:  Other symptoms and signs involving the musculoskeletal system  Pain in right hip  Other abnormalities of gait and mobility  Muscle weakness (generalized)  Rationale for Evaluation and Treatment: Rehabilitation  ONSET DATE: 3 weeks ago  SUBJECTIVE:   SUBJECTIVE STATEMENT: Pt states that he saw Dr Genelle whom  thinks he has tendinitis in his groin and a torn muscle in his glute. Pt states that he continues to have pain in his glute and groin area.   POOL ACCESS: member of [employer]    PERTINENT HISTORY: Hx R THA, R TKA, hx LBP, HTN  PAIN:  Are you having pain? Yes: NPRS scale: 5/10 Pain location: R hip and Rt knee Pain description: aching, sharp Aggravating factors: ROM,  Relieving factors:  rest  PRECAUTIONS: None  WEIGHT BEARING RESTRICTIONS: No  FALLS:  Has patient fallen in last 6 months? No  PLOF: Independent  PATIENT GOALS:  a little bit of relief  OBJECTIVE: (objective measures from initial evaluation unless otherwise dated)  PATIENT SURVEYS:  LEFS  Extreme difficulty/unable (0), Quite a bit of difficulty (1), Moderate difficulty (2), Little difficulty (3), No difficulty (4) Survey date:  07/07/24  Any of your usual work, housework or school activities 1  2. Usual hobbies, recreational or sporting activities 1  3. Getting into/out of the bath 1  4. Walking between rooms 2  5. Putting on socks/shoes 1  6. Squatting  2  7. Lifting an object, like a bag of groceries from the floor 2  8. Performing light activities around your home 2  9. Performing heavy activities around your home 0  10. Getting into/out of a car 2  11. Walking 2 blocks 0  12. Walking 1 mile 0  13. Going up/down 10 stairs (1 flight) 1  14. Standing for 1 hour 1  15.  sitting for 1 hour 2  16. Running on even ground 0  17. Running on uneven ground 0  18. Making sharp turns while running fast 0  19. Hopping  0  20. Rolling over in bed 1  Score total:  19/80     COGNITION: Overall cognitive status: Within functional limits for tasks assessed     SENSATION: WFL  POSTURE: No Significant postural limitations  PALPATION: TTP R greater Troch   LOWER EXTREMITY ROM:  Active ROM Right eval Left eval  Hip flexion    Hip extension    Hip abduction    Hip adduction    Hip internal rotation    Hip external rotation    Knee flexion    Knee extension    Ankle dorsiflexion    Ankle plantarflexion    Ankle inversion    Ankle eversion     (Blank rows = not tested) *= pain/symptoms  LOWER EXTREMITY MMT:  MMT Right eval Left eval  Hip flexion 4+ 5  Hip extension 4 4+  Hip abduction (sidelying) 4-   Hip adduction    Hip internal rotation    Hip external rotation    Knee  flexion 5 5  Knee extension 5 5  Ankle dorsiflexion    Ankle plantarflexion    Ankle inversion    Ankle eversion     (Blank rows = not tested) *= pain/symptoms  FUNCTIONAL TESTS:  5 times sit to stand: 26.97 seconds  Stairs: 7 inch, step too pattern, uses LLE  GAIT: Distance walked: 100 feet  Assistive device utilized: None Level of assistance: Complete Independence Comments: antalgic on RLE with truncal lean to R   TODAY'S TREATMENT:  [facility] Adult PT Treatment:                                             Date:  07/23/2024 -STM to lateral HS  - Attempted SL Bridges with pain in groin - S/l clam 2x10 - Side stepping with BLKTB around ankles - Squats with BLKTB around knees - Discussion about compensations due to multiple surgeries.  - Modified thomas stretch with focus on hip flexors  - Prone HS curls with RTB resistance  07/20/24 Pt seen for aquatic therapy today.  Treatment took place in water  3.5-4.75 ft in depth at the [facility] pool. Temp of water  was 91.  Pt entered/exited the pool via stairs independently in step-to pattern with bil rail.  - Intro to aquatic therapy principles - unsupported walking forward/ backward (feels slightly better facing lap pool) - unsupported side stepping  - UE on wall:  single leg clams x 5 each (pain in Rt hip at end range ER); hip abdct/add crossing midline x 10 each ; hip flexion /extension x 10 each; relaxed squat - Rt quad stretch with ankle supported by hollow noodle x 30s x 2-> moving into hip ext in between reps - straddling noodle and one under arms behind back: cycling; hip abdct/add; cross country ski-all at slow pace  - at bench on deck: IASTM to Rt distal to mid quad to decrease fascial restrictions and improve mobility in RLE  Pt requires the buoyancy and hydrostatic pressure of water  for  support, and to offload joints by unweighting joint load by at least 50 % in navel deep water  and by at least 75-80% in chest to neck deep water .  Viscosity of the water  is needed for resistance of strengthening. Water  current perturbations provides challenge to standing balance requiring increased core activation.     8/29 PROM R hip  Roller to R quads STM to R TFL and lateral glute  Quad set 5 hold x15 STM to R gluteal mm/TFL in s/l with pillow between knees Bridge 2x10 S/l clam 2x10 LAQ 2x10  8/27: PROM R hip  Roller to [employer] 5 hold x15 STM to R gluteal mm/TFL in s/l with pillow between knees KTC with red physioball x20 Bridge x10 (reviewed technique) Hooklying clam x10, added GTB x20 Reviewed quad/HS stretching Discussion of healing process, ice use, activity modifications, and HEP performance within pain limitations       07/07/24 Bridge 2 x 10   Clam 2 x 10   PATIENT EDUCATION:  Education details: intro to aquatic therapy  Person educated: Patient Education method: Programmer, multimedia, Facilities manager,  Education comprehension: verbalized understanding, returned demonstration, verbal cues required, and tactile cues required  HOME EXERCISE PROGRAM: Access Code: [email or web address] URL: [email or web address] Date: 07/07/2024 Prepared by: Prentice Zaunegger  Exercises - Supine Bridge  - 1 x daily - 7 x weekly - 3 sets - 10 reps - Clamshell (Mirrored)  - 1 x daily - 7 x weekly - 3 sets - 10 reps  ASSESSMENT:  CLINICAL IMPRESSION: Pt states that he has trouble feeling any fatigue or muscle burn with any glute med exercises today desite increased resistance and cues for form. Discussion about tightness present on L hip flexors and quads with noted weakness in HS. Discussed focus on mobility and strengthening and importance of balance for proper body mechanics. Attempted SL  bridges with pt reporting pain in his groin when performing on L side. Pt will benefit  from HS strengthening. Will continue to progress as tolerated.   Eval: Patient a 58 y.o. y.o. male who was seen today for physical therapy evaluation and treatment for S/P right hip trochanteric bursectomy and IT band release. Patient presents with pain limited deficits in R hip strength, ROM, endurance, activity tolerance, and functional mobility with ADL. Patient is having to modify and restrict ADL as indicated by outcome measure score as well as subjective information and objective measures which is affecting overall participation. Patient will benefit from skilled physical therapy in order to improve function and reduce impairment.  OBJECTIVE IMPAIRMENTS: Abnormal gait, decreased activity tolerance, decreased balance, decreased endurance, decreased mobility, difficulty walking, decreased ROM, decreased strength, increased muscle spasms, impaired flexibility, improper body mechanics, and pain  ACTIVITY LIMITATIONS: lifting, bending, standing, squatting, stairs, transfers, locomotion level, and caring for others  PARTICIPATION LIMITATIONS: meal prep, cleaning, laundry, shopping, community activity, occupation, and yard work  PERSONAL FACTORS: Fitness and 3+ comorbidities: Hx R THA, R TKA, hx LBP, HTN are also affecting patient's functional outcome.   REHAB POTENTIAL: Good  CLINICAL DECISION MAKING: Evolving/moderate complexity  EVALUATION COMPLEXITY: Moderate   GOALS: Goals reviewed with patient? Yes  SHORT TERM GOALS: Target date: 08/04/2024    Patient will be independent with HEP in order to improve functional outcomes. Baseline: Goal status: INITIAL  2.  Patient will report at least 25% improvement in symptoms/functional status for improved quality of life. Baseline: Goal status: INITIAL    LONG TERM GOALS: Target date: 09/01/2024    Patient will report at least 75% improvement in symptoms for improved quality of life. Baseline:  Goal status: INITIAL  2.  Patient will  improve LEFS  score by at least 15 points in order to indicate improved tolerance to activity. Baseline:  Goal status: INITIAL  3.  Patient will be able to navigate stairs with reciprocal pattern without compensation in order to demonstrate improved LE strength. Baseline:  Goal status: INITIAL  4. Patient will demonstrate grade of 5/5 MMT grade in all tested musculature as evidence of improved strength to assist with stair ambulation and gait. Baseline:  Goal status: INITIAL  5.  Patient will be able to complete 5x STS in under 15 seconds in order to reduce the risk of falls. Baseline:  Goal status: INITIAL     PLAN:  PT FREQUENCY: 2x/week  PT DURATION: 8 weeks  PLANNED INTERVENTIONS: 97164- PT Re-evaluation, 97110-Therapeutic exercises, 97530- Therapeutic activity, 97112- Neuromuscular re-education, 97535- Self Care, [id number]- Manual therapy, [phone]- Gait training, [phone]- Orthotic Fit/training, [phone]- Canalith repositioning, [id number]- Aquatic Therapy, [phone]- Splinting, [phone]- Wound care (first 20 sq cm), 97598- Wound care (each additional 20 sq cm)Patient/Family education, Balance training, Stair training, Taping, Dry Needling, Joint mobilization, Joint manipulation, Spinal manipulation, Spinal mobilization, Scar mobilization, and DME instructions.  PLAN FOR NEXT SESSION: aquatics and land, hip strength, functional strength  Rojean Batten PT, DPT 07/23/24  11:14 AM

## 2024-07-25 ENCOUNTER — Other Ambulatory Visit (HOSPITAL_BASED_OUTPATIENT_CLINIC_OR_DEPARTMENT_OTHER): Payer: Self-pay

## 2024-07-25 ENCOUNTER — Other Ambulatory Visit (HOSPITAL_BASED_OUTPATIENT_CLINIC_OR_DEPARTMENT_OTHER): Payer: Self-pay | Admitting: Orthopaedic Surgery

## 2024-07-25 MED ORDER — LORAZEPAM 1 MG PO TABS
1.0000 mg | ORAL_TABLET | Freq: Three times a day (TID) | ORAL | 0 refills | Status: AC
  Filled 2024-07-25: qty 2, 1d supply, fill #0

## 2024-07-26 ENCOUNTER — Ambulatory Visit
Admission: RE | Admit: 2024-07-26 | Discharge: 2024-07-26 | Disposition: A | Discharge status: Home / Self Care | Source: Ambulatory Visit | Attending: Orthopaedic Surgery

## 2024-07-26 ENCOUNTER — Encounter (HOSPITAL_BASED_OUTPATIENT_CLINIC_OR_DEPARTMENT_OTHER): Payer: Self-pay

## 2024-07-26 ENCOUNTER — Ambulatory Visit (HOSPITAL_BASED_OUTPATIENT_CLINIC_OR_DEPARTMENT_OTHER)

## 2024-07-26 ENCOUNTER — Other Ambulatory Visit (HOSPITAL_BASED_OUTPATIENT_CLINIC_OR_DEPARTMENT_OTHER): Payer: Self-pay

## 2024-07-26 DIAGNOSIS — Z96641 Presence of right artificial hip joint: Secondary | ICD-10-CM

## 2024-07-26 DIAGNOSIS — R29898 Other symptoms and signs involving the musculoskeletal system: Secondary | ICD-10-CM

## 2024-07-26 DIAGNOSIS — M25551 Pain in right hip: Secondary | ICD-10-CM

## 2024-07-26 DIAGNOSIS — R2689 Other abnormalities of gait and mobility: Secondary | ICD-10-CM

## 2024-07-26 DIAGNOSIS — M6281 Muscle weakness (generalized): Secondary | ICD-10-CM

## 2024-07-26 NOTE — Therapy (Signed)
 OUTPATIENT PHYSICAL THERAPY LOWER EXTREMITY TREATMENT   Patient Name: Reginald [name] MRN: [id number] DOB:07-07-1966, 58 y.o., male Today's Date: 07/26/2024  END OF SESSION:  PT End of Session - 07/26/24 1022     Visit Number 6    Number of Visits 16    Date for PT Re-Evaluation 09/01/24    Authorization Type [employer]    Progress Note Due on Visit 10    PT Start Time 1017    PT Stop Time 1100    PT Time Calculation (min) 43 min    Activity Tolerance Patient tolerated treatment well    Behavior During Therapy WFL for tasks assessed/performed              Past Medical History:  Diagnosis Date   Arthritis    Gallstones    Hypertension    Past Surgical History:  Procedure Laterality Date   CHOLECYSTECTOMY  12/03/2011   Procedure: LAPAROSCOPIC CHOLECYSTECTOMY WITH INTRAOPERATIVE CHOLANGIOGRAM;  Surgeon: Camellia CHRISTELLA Blush, MD;  Location: [facility] OR;  Service: General;  Laterality: N/A;  laparoscopic cholecystectomy with intraoperative cholangiogram   CHONDROPLASTY Right 09/19/2015   Procedure: CHONDROPLASTY;  Surgeon: Norleen Gavel, MD;  Location: [facility];  Service: Orthopedics;  Laterality: Right;   ENDOVENOUS ABLATION SAPHENOUS VEIN W/ LASER Right 01/13/2019   endovenous laser ablation right greater saphenous vein and stab phlebectomy 10-20 incisions right leg by Lonni Blade MD    IR ABLATE LIVER CRYOABLATION  04/03/2020   IR RADIOLOGIST EVAL & MGMT  03/28/2020   KNEE ARTHROSCOPY WITH LATERAL MENISECTOMY Right 09/19/2015   Procedure: KNEE ARTHROSCOPY WITH PARTIAL LATERAL MENISECTOMY;  Surgeon: Norleen Gavel, MD;  Location: [facility];  Service: Orthopedics;  Laterality: Right;   KNEE ARTHROSCOPY WITH MEDIAL MENISECTOMY Right 09/19/2015   Procedure: KNEE ARTHROSCOPY WITH PARTIAL MEDIAL MENISECTOMY;  Surgeon: Norleen Gavel, MD;  Location: [facility];  Service: Orthopedics;  Laterality: Right;   ROTATOR CUFF REPAIR Left     with revision   spider bite     black widow or brown recluse   TOE FUSION Left    TOTAL HIP ARTHROPLASTY Right 06/15/2023   Procedure: TOTAL HIP ARTHROPLASTY ANTERIOR APPROACH;  Surgeon: Gavel Norleen, MD;  Location: [facility];  Service: Orthopedics;  Laterality: Right;   TOTAL KNEE ARTHROPLASTY Right 03/31/2018   Procedure: RIGHT TOTAL KNEE ARTHROPLASTY;  Surgeon: Gavel Norleen, MD;  Location: [facility];  Service: Orthopedics;  Laterality: Right;   TOTAL KNEE REVISION Right 11/04/2019   Procedure: RIGHT TOTAL KNEE REVISION;  Surgeon: Gavel Norleen, MD;  Location: [facility];  Service: Orthopedics;  Laterality: Right;   TRIGGER FINGER RELEASE Right    Patient Active Problem List   Diagnosis Date Noted   Primary osteoarthritis of right hip 06/14/2023   Meralgia paraesthetica, right 06/10/2023   Spondylosis without myelopathy or radiculopathy, lumbar region 06/10/2023   Painful total knee replacement, right (HCC) 11/04/2019   Arthrofibrosis of knee joint, right 11/04/2019   S/P revision of total knee, right 11/04/2019   Primary osteoarthritis of right knee 03/31/2018   Hypertension 12/02/2011   Obesity (BMI 30-39.9) 12/02/2011    PCP: [facility]  REFERRING PROVIDER: Gavel Norleen, MD   REFERRING DIAG: S/P right hip trochanteric bursectomy and IT band release  THERAPY DIAG:  Other symptoms and signs involving the musculoskeletal system  Pain in right hip  Other abnormalities of gait and mobility  Muscle weakness (generalized)  Rationale for Evaluation and Treatment: Rehabilitation  ONSET DATE: 3 weeks ago  SUBJECTIVE:   SUBJECTIVE STATEMENT: Pt has MRI scheduled following today's session. Pt reports injection helped temporarily. 4.5/10 pain level at entry.  POOL ACCESS: member of [employer]    PERTINENT HISTORY: Hx R THA, R TKA, hx LBP, HTN  PAIN:  Are you having pain? Yes: NPRS scale: 4.5/10 Pain location: R hip and Rt knee Pain description: aching,  sharp Aggravating factors: ROM,  Relieving factors: rest  PRECAUTIONS: None  WEIGHT BEARING RESTRICTIONS: No  FALLS:  Has patient fallen in last 6 months? No  PLOF: Independent  PATIENT GOALS:  a little bit of relief  OBJECTIVE: (objective measures from initial evaluation unless otherwise dated)  PATIENT SURVEYS:  LEFS  Extreme difficulty/unable (0), Quite a bit of difficulty (1), Moderate difficulty (2), Little difficulty (3), No difficulty (4) Survey date:  07/07/24  Any of your usual work, housework or school activities 1  2. Usual hobbies, recreational or sporting activities 1  3. Getting into/out of the bath 1  4. Walking between rooms 2  5. Putting on socks/shoes 1  6. Squatting  2  7. Lifting an object, like a bag of groceries from the floor 2  8. Performing light activities around your home 2  9. Performing heavy activities around your home 0  10. Getting into/out of a car 2  11. Walking 2 blocks 0  12. Walking 1 mile 0  13. Going up/down 10 stairs (1 flight) 1  14. Standing for 1 hour 1  15.  sitting for 1 hour 2  16. Running on even ground 0  17. Running on uneven ground 0  18. Making sharp turns while running fast 0  19. Hopping  0  20. Rolling over in bed 1  Score total:  19/80     COGNITION: Overall cognitive status: Within functional limits for tasks assessed     SENSATION: WFL  POSTURE: No Significant postural limitations  PALPATION: TTP R greater Troch   LOWER EXTREMITY ROM:  Active ROM Right eval Left eval  Hip flexion    Hip extension    Hip abduction    Hip adduction    Hip internal rotation    Hip external rotation    Knee flexion    Knee extension    Ankle dorsiflexion    Ankle plantarflexion    Ankle inversion    Ankle eversion     (Blank rows = not tested) *= pain/symptoms  LOWER EXTREMITY MMT:  MMT Right eval Left eval  Hip flexion 4+ 5  Hip extension 4 4+  Hip abduction (sidelying) 4-   Hip adduction    Hip  internal rotation    Hip external rotation    Knee flexion 5 5  Knee extension 5 5  Ankle dorsiflexion    Ankle plantarflexion    Ankle inversion    Ankle eversion     (Blank rows = not tested) *= pain/symptoms  FUNCTIONAL TESTS:  5 times sit to stand: 26.97 seconds  Stairs: 7 inch, step too pattern, uses LLE  GAIT: Distance walked: 100 feet  Assistive device utilized: None Level of assistance: Complete Independence Comments: antalgic on RLE with truncal lean to R   TODAY'S TREATMENT:  [facility] Adult PT Treatment:                                               Date:  07/26/2024 -PROM R hip -STM to proximal ihip flexors, HS -STM/roller to lateral HS  - Bridge 2x10 - S/l clam 2x10 - Side stepping with BLKTB around ankles x5 laps at rail - Squats 2x15 -partial lunges x10ea - Modified thomas stretch with focus on hip flexors  - Prone HS curls x20 Standing hip extension 2x20ea  Date:  07/23/2024 -STM to lateral HS  - Attempted SL Bridges with pain in groin - S/l clam 2x10 - Side stepping with BLKTB around ankles - Squats with BLKTB around knees - Discussion about compensations due to multiple surgeries.  - Modified thomas stretch with focus on hip flexors  - Prone HS curls with RTB resistance  07/20/24 Pt seen for aquatic therapy today.  Treatment took place in water  3.5-4.75 ft in depth at the [facility] pool. Temp of water  was 91.  Pt entered/exited the pool via stairs independently in step-to pattern with bil rail.  - Intro to aquatic therapy principles - unsupported walking forward/ backward (feels slightly better facing lap pool) - unsupported side stepping  - UE on wall:  single leg clams x 5 each (pain in Rt hip at end range ER); hip abdct/add crossing midline x 10 each ; hip flexion /extension x 10 each; relaxed squat - Rt  quad stretch with ankle supported by hollow noodle x 30s x 2-> moving into hip ext in between reps - straddling noodle and one under arms behind back: cycling; hip abdct/add; cross country ski-all at slow pace  - at bench on deck: IASTM to Rt distal to mid quad to decrease fascial restrictions and improve mobility in RLE  Pt requires the buoyancy and hydrostatic pressure of water  for support, and to offload joints by unweighting joint load by at least 50 % in navel deep water  and by at least 75-80% in chest to neck deep water .  Viscosity of the water  is needed for resistance of strengthening. Water  current perturbations provides challenge to standing balance requiring increased core activation.     8/29 PROM R hip  Roller to R quads STM to R TFL and lateral glute  Quad set 5 hold x15 STM to R gluteal mm/TFL in s/l with pillow between knees Bridge 2x10 S/l clam 2x10 LAQ 2x10  8/27: PROM R hip  Roller to [employer] 5 hold x15 STM to R gluteal mm/TFL in s/l with pillow between knees KTC with red physioball x20 Bridge x10 (reviewed technique) Hooklying clam x10, added GTB x20 Reviewed quad/HS stretching Discussion of healing process, ice use, activity modifications, and HEP performance within pain limitations       07/07/24 Bridge 2 x 10   Clam 2 x 10   PATIENT EDUCATION:  Education details: intro to aquatic therapy  Person educated: Patient Education method: Programmer, multimedia, Facilities manager,  Education comprehension: verbalized understanding, returned demonstration, verbal cues required, and tactile cues required  HOME EXERCISE PROGRAM: Access Code: [email or web address] URL: [email or web address] Date: 07/07/2024 Prepared by: Prentice Zaunegger  Exercises - Supine Bridge  - 1 x daily - 7 x weekly - 3 sets - 10 reps - Clamshell (Mirrored)  - 1 x daily - 7 x weekly - 3 sets -  10 reps  ASSESSMENT:  CLINICAL IMPRESSION: Pt most tender in lateral hip and distal  lateral HS. Spent time of STM to these areas to decrease restrictions. Progressed strengthening exercises today with god tolerance. Instructed pt to avoid pushing past pain limitations. He had improved tolerance for PROM on R hip today compared to previous sessions. Will continue to progress as tolerated.   Eval: Patient a 58 y.o. y.o. male who was seen today for physical therapy evaluation and treatment for S/P right hip trochanteric bursectomy and IT band release. Patient presents with pain limited deficits in R hip strength, ROM, endurance, activity tolerance, and functional mobility with ADL. Patient is having to modify and restrict ADL as indicated by outcome measure score as well as subjective information and objective measures which is affecting overall participation. Patient will benefit from skilled physical therapy in order to improve function and reduce impairment.  OBJECTIVE IMPAIRMENTS: Abnormal gait, decreased activity tolerance, decreased balance, decreased endurance, decreased mobility, difficulty walking, decreased ROM, decreased strength, increased muscle spasms, impaired flexibility, improper body mechanics, and pain  ACTIVITY LIMITATIONS: lifting, bending, standing, squatting, stairs, transfers, locomotion level, and caring for others  PARTICIPATION LIMITATIONS: meal prep, cleaning, laundry, shopping, community activity, occupation, and yard work  PERSONAL FACTORS: Fitness and 3+ comorbidities: Hx R THA, R TKA, hx LBP, HTN are also affecting patient's functional outcome.   REHAB POTENTIAL: Good  CLINICAL DECISION MAKING: Evolving/moderate complexity  EVALUATION COMPLEXITY: Moderate   GOALS: Goals reviewed with patient? Yes  SHORT TERM GOALS: Target date: 08/04/2024    Patient will be independent with HEP in order to improve functional outcomes. Baseline: Goal status: INITIAL  2.  Patient will report at least 25% improvement in symptoms/functional status for improved  quality of life. Baseline: Goal status: INITIAL    LONG TERM GOALS: Target date: 09/01/2024    Patient will report at least 75% improvement in symptoms for improved quality of life. Baseline:  Goal status: INITIAL  2.  Patient will improve LEFS  score by at least 15 points in order to indicate improved tolerance to activity. Baseline:  Goal status: INITIAL  3.  Patient will be able to navigate stairs with reciprocal pattern without compensation in order to demonstrate improved LE strength. Baseline:  Goal status: INITIAL  4. Patient will demonstrate grade of 5/5 MMT grade in all tested musculature as evidence of improved strength to assist with stair ambulation and gait. Baseline:  Goal status: INITIAL  5.  Patient will be able to complete 5x STS in under 15 seconds in order to reduce the risk of falls. Baseline:  Goal status: INITIAL     PLAN:  PT FREQUENCY: 2x/week  PT DURATION: 8 weeks  PLANNED INTERVENTIONS: 97164- PT Re-evaluation, 97110-Therapeutic exercises, 97530- Therapeutic activity, 97112- Neuromuscular re-education, 97535- Self Care, [id number]- Manual therapy, [phone]- Gait training, [phone]- Orthotic Fit/training, [phone]- Canalith repositioning, [id number]- Aquatic Therapy, [phone]- Splinting, [phone]- Wound care (first 20 sq cm), 97598- Wound care (each additional 20 sq cm)Patient/Family education, Balance training, Stair training, Taping, Dry Needling, Joint mobilization, Joint manipulation, Spinal manipulation, Spinal mobilization, Scar mobilization, and DME instructions.  PLAN FOR NEXT SESSION: aquatics and land, hip strength, functional strength  Asberry Rodes, PTA  07/26/24  2:30 PM

## 2024-07-28 ENCOUNTER — Ambulatory Visit (HOSPITAL_BASED_OUTPATIENT_CLINIC_OR_DEPARTMENT_OTHER): Payer: Self-pay | Admitting: Physical Therapy

## 2024-07-28 ENCOUNTER — Encounter (HOSPITAL_BASED_OUTPATIENT_CLINIC_OR_DEPARTMENT_OTHER): Payer: Self-pay | Admitting: Physical Therapy

## 2024-07-28 DIAGNOSIS — M6281 Muscle weakness (generalized): Secondary | ICD-10-CM

## 2024-07-28 DIAGNOSIS — M25551 Pain in right hip: Secondary | ICD-10-CM

## 2024-07-28 DIAGNOSIS — R29898 Other symptoms and signs involving the musculoskeletal system: Secondary | ICD-10-CM

## 2024-07-28 DIAGNOSIS — R2689 Other abnormalities of gait and mobility: Secondary | ICD-10-CM

## 2024-07-28 NOTE — Therapy (Signed)
 OUTPATIENT PHYSICAL THERAPY LOWER EXTREMITY TREATMENT   Patient Name: Reginald [name] MRN: [id number] DOB:1966/09/15, 58 y.o., male Today's Date: 07/28/2024  END OF SESSION:  PT End of Session - 07/28/24 0833     Visit Number 7    Number of Visits 16    Authorization Type [employer]    Progress Note Due on Visit 10    PT Start Time 0800    PT Stop Time 0843    PT Time Calculation (min) 43 min    Behavior During Therapy [facility] for tasks assessed/performed          Past Medical History:  Diagnosis Date   Arthritis    Gallstones    Hypertension    Past Surgical History:  Procedure Laterality Date   CHOLECYSTECTOMY  12/03/2011   Procedure: LAPAROSCOPIC CHOLECYSTECTOMY WITH INTRAOPERATIVE CHOLANGIOGRAM;  Surgeon: Camellia CHRISTELLA Blush, MD;  Location: [facility];  Service: General;  Laterality: N/A;  laparoscopic cholecystectomy with intraoperative cholangiogram   CHONDROPLASTY Right 09/19/2015   Procedure: CHONDROPLASTY;  Surgeon: Norleen Gavel, MD;  Location: [facility];  Service: Orthopedics;  Laterality: Right;   ENDOVENOUS ABLATION SAPHENOUS VEIN W/ LASER Right 01/13/2019   endovenous laser ablation right greater saphenous vein and stab phlebectomy 10-20 incisions right leg by Lonni Blade MD    IR ABLATE LIVER CRYOABLATION  04/03/2020   IR RADIOLOGIST EVAL & MGMT  03/28/2020   KNEE ARTHROSCOPY WITH LATERAL MENISECTOMY Right 09/19/2015   Procedure: KNEE ARTHROSCOPY WITH PARTIAL LATERAL MENISECTOMY;  Surgeon: Norleen Gavel, MD;  Location: [facility];  Service: Orthopedics;  Laterality: Right;   KNEE ARTHROSCOPY WITH MEDIAL MENISECTOMY Right 09/19/2015   Procedure: KNEE ARTHROSCOPY WITH PARTIAL MEDIAL MENISECTOMY;  Surgeon: Norleen Gavel, MD;  Location: [facility];  Service: Orthopedics;  Laterality: Right;   ROTATOR CUFF REPAIR Left    with revision   spider bite     black widow or brown recluse   TOE FUSION Left    TOTAL HIP  ARTHROPLASTY Right 06/15/2023   Procedure: TOTAL HIP ARTHROPLASTY ANTERIOR APPROACH;  Surgeon: Gavel Norleen, MD;  Location: [facility];  Service: Orthopedics;  Laterality: Right;   TOTAL KNEE ARTHROPLASTY Right 03/31/2018   Procedure: RIGHT TOTAL KNEE ARTHROPLASTY;  Surgeon: Gavel Norleen, MD;  Location: [facility];  Service: Orthopedics;  Laterality: Right;   TOTAL KNEE REVISION Right 11/04/2019   Procedure: RIGHT TOTAL KNEE REVISION;  Surgeon: Gavel Norleen, MD;  Location: [facility];  Service: Orthopedics;  Laterality: Right;   TRIGGER FINGER RELEASE Right    Patient Active Problem List   Diagnosis Date Noted   Primary osteoarthritis of right hip 06/14/2023   Meralgia paraesthetica, right 06/10/2023   Spondylosis without myelopathy or radiculopathy, lumbar region 06/10/2023   Painful total knee replacement, right (HCC) 11/04/2019   Arthrofibrosis of knee joint, right 11/04/2019   S/P revision of total knee, right 11/04/2019   Primary osteoarthritis of right knee 03/31/2018   Hypertension 12/02/2011   Obesity (BMI 30-39.9) 12/02/2011    PCP: [facility]  REFERRING PROVIDER: Gavel Norleen, MD   REFERRING DIAG: S/P right hip trochanteric bursectomy and IT band release  THERAPY DIAG:  Other symptoms and signs involving the musculoskeletal system  Pain in right hip  Other abnormalities of gait and mobility  Muscle weakness (generalized)  Rationale for Evaluation and Treatment: Rehabilitation  ONSET DATE: 3 weeks ago  SUBJECTIVE:   SUBJECTIVE STATEMENT: Pt had MRI of hip but hasn't received results  yet.   POOL ACCESS: member of [employer]    PERTINENT HISTORY: Hx R THA, R TKA, hx LBP, HTN  PAIN:  Are you having pain? Yes: NPRS scale: 5/10 Pain location: R hip, Rt groin and Rt distal lateral knee Pain description: aching, sharp Aggravating factors: ROM,  Relieving factors: rest  PRECAUTIONS: None  WEIGHT BEARING RESTRICTIONS: No  FALLS:  Has patient fallen  in last 6 months? No  PLOF: Independent  PATIENT GOALS:  a little bit of relief  OBJECTIVE: (objective measures from initial evaluation unless otherwise dated)  PATIENT SURVEYS:  LEFS  Extreme difficulty/unable (0), Quite a bit of difficulty (1), Moderate difficulty (2), Little difficulty (3), No difficulty (4) Survey date:  07/07/24  Any of your usual work, housework or school activities 1  2. Usual hobbies, recreational or sporting activities 1  3. Getting into/out of the bath 1  4. Walking between rooms 2  5. Putting on socks/shoes 1  6. Squatting  2  7. Lifting an object, like a bag of groceries from the floor 2  8. Performing light activities around your home 2  9. Performing heavy activities around your home 0  10. Getting into/out of a car 2  11. Walking 2 blocks 0  12. Walking 1 mile 0  13. Going up/down 10 stairs (1 flight) 1  14. Standing for 1 hour 1  15.  sitting for 1 hour 2  16. Running on even ground 0  17. Running on uneven ground 0  18. Making sharp turns while running fast 0  19. Hopping  0  20. Rolling over in bed 1  Score total:  19/80     COGNITION: Overall cognitive status: Within functional limits for tasks assessed     SENSATION: WFL  POSTURE: No Significant postural limitations  PALPATION: TTP R greater Troch   LOWER EXTREMITY ROM:  Active ROM Right eval Left eval  Hip flexion    Hip extension    Hip abduction    Hip adduction    Hip internal rotation    Hip external rotation    Knee flexion    Knee extension    Ankle dorsiflexion    Ankle plantarflexion    Ankle inversion    Ankle eversion     (Blank rows = not tested) *= pain/symptoms  LOWER EXTREMITY MMT:  MMT Right eval Left eval  Hip flexion 4+ 5  Hip extension 4 4+  Hip abduction (sidelying) 4-   Hip adduction    Hip internal rotation    Hip external rotation    Knee flexion 5 5  Knee extension 5 5  Ankle dorsiflexion    Ankle plantarflexion    Ankle  inversion    Ankle eversion     (Blank rows = not tested) *= pain/symptoms  FUNCTIONAL TESTS:  5 times sit to stand: 26.97 seconds  Stairs: 7 inch, step too pattern, uses LLE  GAIT: Distance walked: 100 feet  Assistive device utilized: None Level of assistance: Complete Independence Comments: antalgic on RLE with truncal lean to R   TODAY'S TREATMENT:  [facility] Adult PT Treatment:                                              07/28/24 Pt seen for aquatic therapy today.  Treatment took place in water  3.5-4.75 ft in depth at the [facility] pool. Temp of water  was 91.  Pt entered/exited the pool via stairs independently in step-to pattern with bil rail.  Manual therapy prior to entering pool: MFR to Rt iliopsoas;  IASTM to Rt distal to mid quad to decrease fascial restrictions and improve mobility in RLE Self care: pt instructed in self MFR to Rt iliopsoas with ball in prone position; STM with ball to Rt posterior hip musculature against wall - pt returned demo with cues Therapeutic ex: prone Rt quad stretch with strap and towel above knee, x2 reps of 20s - unsupported walking forward/ backward with long strides for hip flexor stretch - unsupported side stepping with straight knees - UE on wall: split squats x 10 each LE; single leg clams x 10 each;  hip flexion /extension x 10 each - UE on yellow hand floats split squats walking forward 1 lap - at wall/ rails: fig4 stretch each LE x 20s x 2 each LE;  Rt hip flexor stretch  x 20s  Date:  07/26/2024 -PROM R hip -STM to proximal ihip flexors, HS -STM/roller to lateral HS  - Bridge 2x10 - S/l clam 2x10 - Side stepping with BLKTB around ankles x5 laps at rail - Squats 2x15 -partial lunges x10ea - Modified thomas stretch with focus on hip flexors  - Prone HS curls x20 Standing hip extension 2x20ea  Date:   07/23/2024 -STM to lateral HS  - Attempted SL Bridges with pain in groin - S/l clam 2x10 - Side stepping with BLKTB around ankles - Squats with BLKTB around knees - Discussion about compensations due to multiple surgeries.  - Modified thomas stretch with focus on hip flexors  - Prone HS curls with RTB resistance  07/20/24 Pt seen for aquatic therapy today.  Treatment took place in water  3.5-4.75 ft in depth at the [facility] pool. Temp of water  was 91.  Pt entered/exited the pool via stairs independently in step-to pattern with bil rail.  - Intro to aquatic therapy principles - unsupported walking forward/ backward (feels slightly better facing lap pool) - unsupported side stepping  - UE on wall:  single leg clams x 5 each (pain in Rt hip at end range ER); hip abdct/add crossing midline x 10 each ; hip flexion /extension x 10 each; relaxed squat - Rt quad stretch with ankle supported by hollow noodle x 30s x 2-> moving into hip ext in between reps - straddling noodle and one under arms behind back: cycling; hip abdct/add; cross country ski-all at slow pace  - at bench on deck: IASTM to Rt distal to mid quad to decrease fascial restrictions and improve mobility in RLE   8/29 PROM R hip  Roller to R quads STM to R TFL and lateral glute  Quad set 5 hold x15 STM to R gluteal mm/TFL in s/l with pillow between knees Bridge 2x10 S/l clam 2x10 LAQ 2x10  8/27: PROM R hip  Roller to R [employer] set 5 hold x15 STM to R gluteal mm/TFL in s/l with pillow between knees KTC with red physioball x20 Bridge x10 (reviewed  technique) Hooklying clam x10, added GTB x20 Reviewed quad/HS stretching Discussion of healing process, ice use, activity modifications, and HEP performance within pain limitations  07/07/24 Bridge 2 x 10   Clam 2 x 10   PATIENT EDUCATION:  Education details: self massage techniques with ball  Person educated: Patient Education method: Advertising account executive,  Education comprehension: verbalized understanding, returned demonstration, verbal cues required, and tactile cues required  HOME EXERCISE PROGRAM: Access Code: [email or web address] URL: [email or web address] Date: 07/07/2024 Prepared by: Prentice Zaunegger  Exercises - Supine Bridge  - 1 x daily - 7 x weekly - 3 sets - 10 reps - Clamshell (Mirrored)  - 1 x daily - 7 x weekly - 3 sets - 10 reps  ASSESSMENT:  CLINICAL IMPRESSION: Pt reported reduction of Rt groin and posterior hip pain after self massage instruction. Rt distal lateral knee pain remained unchanged during session. Tolerated exercises well, with some increase in Rt knee pain during LE stretches.  Will continue to progress as tolerated.   Eval: Patient a 58 y.o. y.o. male who was seen today for physical therapy evaluation and treatment for S/P right hip trochanteric bursectomy and IT band release. Patient presents with pain limited deficits in R hip strength, ROM, endurance, activity tolerance, and functional mobility with ADL. Patient is having to modify and restrict ADL as indicated by outcome measure score as well as subjective information and objective measures which is affecting overall participation. Patient will benefit from skilled physical therapy in order to improve function and reduce impairment.  OBJECTIVE IMPAIRMENTS: Abnormal gait, decreased activity tolerance, decreased balance, decreased endurance, decreased mobility, difficulty walking, decreased ROM, decreased strength, increased muscle spasms, impaired flexibility, improper body mechanics, and pain  ACTIVITY LIMITATIONS: lifting, bending, standing, squatting, stairs, transfers, locomotion level, and caring for others  PARTICIPATION LIMITATIONS: meal prep, cleaning, laundry, shopping, community activity, occupation, and yard work  PERSONAL FACTORS: Fitness and 3+ comorbidities: Hx R THA, R TKA, hx LBP, HTN are also affecting patient's functional  outcome.   REHAB POTENTIAL: Good  CLINICAL DECISION MAKING: Evolving/moderate complexity  EVALUATION COMPLEXITY: Moderate   GOALS: Goals reviewed with patient? Yes  SHORT TERM GOALS: Target date: 08/04/2024    Patient will be independent with HEP in order to improve functional outcomes. Baseline: Goal status: INITIAL  2.  Patient will report at least 25% improvement in symptoms/functional status for improved quality of life. Baseline: Goal status: INITIAL    LONG TERM GOALS: Target date: 09/01/2024    Patient will report at least 75% improvement in symptoms for improved quality of life. Baseline:  Goal status: INITIAL  2.  Patient will improve LEFS  score by at least 15 points in order to indicate improved tolerance to activity. Baseline:  Goal status: INITIAL  3.  Patient will be able to navigate stairs with reciprocal pattern without compensation in order to demonstrate improved LE strength. Baseline:  Goal status: INITIAL  4. Patient will demonstrate grade of 5/5 MMT grade in all tested musculature as evidence of improved strength to assist with stair ambulation and gait. Baseline:  Goal status: INITIAL  5.  Patient will be able to complete 5x STS in under 15 seconds in order to reduce the risk of falls. Baseline:  Goal status: INITIAL     PLAN:  PT FREQUENCY: 2x/week  PT DURATION: 8 weeks  PLANNED INTERVENTIONS: 97164- PT Re-evaluation, 97110-Therapeutic exercises, 97530- Therapeutic activity, [id number]- Neuromuscular re-education, 97535- Self Care, 02859- Manual therapy, [id number]- Gait training, [phone]- Orthotic Fit/training, [phone]- Canalith  repositioning, 02886- Aquatic Therapy, [id number]- Splinting, 02402- Wound care (first 20 sq cm), 97598- Wound care (each additional 20 sq cm)Patient/Family education, Balance training, Stair training, Taping, Dry Needling, Joint mobilization, Joint manipulation, Spinal manipulation, Spinal mobilization, Scar mobilization, and DME  instructions.  PLAN FOR NEXT SESSION: aquatics and land, hip strength, functional strength  Delon Aquas, PTA 07/28/24 8:59 AM [facility] [address] [place], [place], [address] Phone: [phone]   Fax:  [phone]

## 2024-08-02 ENCOUNTER — Ambulatory Visit (HOSPITAL_BASED_OUTPATIENT_CLINIC_OR_DEPARTMENT_OTHER): Admitting: Physical Therapy

## 2024-08-02 ENCOUNTER — Encounter (HOSPITAL_BASED_OUTPATIENT_CLINIC_OR_DEPARTMENT_OTHER): Payer: Self-pay | Admitting: Physical Therapy

## 2024-08-02 DIAGNOSIS — R29898 Other symptoms and signs involving the musculoskeletal system: Secondary | ICD-10-CM

## 2024-08-02 DIAGNOSIS — M25551 Pain in right hip: Secondary | ICD-10-CM

## 2024-08-02 DIAGNOSIS — M6281 Muscle weakness (generalized): Secondary | ICD-10-CM

## 2024-08-02 DIAGNOSIS — R2689 Other abnormalities of gait and mobility: Secondary | ICD-10-CM

## 2024-08-02 NOTE — Therapy (Signed)
 OUTPATIENT PHYSICAL THERAPY LOWER EXTREMITY TREATMENT   Patient Name: Reginald [name] MRN: [id number] DOB:Mar 01, 1966, 58 y.o., male Today's Date: 08/02/2024  END OF SESSION:  PT End of Session - 08/02/24 0843     Visit Number 8    Number of Visits 16    Authorization Type [employer]    Progress Note Due on Visit 10    PT Start Time 0845    PT Stop Time 0925    PT Time Calculation (min) 40 min    Activity Tolerance Patient tolerated treatment well    Behavior During Therapy [facility] for tasks assessed/performed          Past Medical History:  Diagnosis Date   Arthritis    Gallstones    Hypertension    Past Surgical History:  Procedure Laterality Date   CHOLECYSTECTOMY  12/03/2011   Procedure: LAPAROSCOPIC CHOLECYSTECTOMY WITH INTRAOPERATIVE CHOLANGIOGRAM;  Surgeon: Camellia CHRISTELLA Blush, MD;  Location: [facility] OR;  Service: General;  Laterality: N/A;  laparoscopic cholecystectomy with intraoperative cholangiogram   CHONDROPLASTY Right 09/19/2015   Procedure: CHONDROPLASTY;  Surgeon: Norleen Gavel, MD;  Location: [facility];  Service: Orthopedics;  Laterality: Right;   ENDOVENOUS ABLATION SAPHENOUS VEIN W/ LASER Right 01/13/2019   endovenous laser ablation right greater saphenous vein and stab phlebectomy 10-20 incisions right leg by Lonni Blade MD    IR ABLATE LIVER CRYOABLATION  04/03/2020   IR RADIOLOGIST EVAL & MGMT  03/28/2020   KNEE ARTHROSCOPY WITH LATERAL MENISECTOMY Right 09/19/2015   Procedure: KNEE ARTHROSCOPY WITH PARTIAL LATERAL MENISECTOMY;  Surgeon: Norleen Gavel, MD;  Location: [facility];  Service: Orthopedics;  Laterality: Right;   KNEE ARTHROSCOPY WITH MEDIAL MENISECTOMY Right 09/19/2015   Procedure: KNEE ARTHROSCOPY WITH PARTIAL MEDIAL MENISECTOMY;  Surgeon: Norleen Gavel, MD;  Location: [facility];  Service: Orthopedics;  Laterality: Right;   ROTATOR CUFF REPAIR Left    with revision   spider bite     black widow  or brown recluse   TOE FUSION Left    TOTAL HIP ARTHROPLASTY Right 06/15/2023   Procedure: TOTAL HIP ARTHROPLASTY ANTERIOR APPROACH;  Surgeon: Gavel Norleen, MD;  Location: [facility];  Service: Orthopedics;  Laterality: Right;   TOTAL KNEE ARTHROPLASTY Right 03/31/2018   Procedure: RIGHT TOTAL KNEE ARTHROPLASTY;  Surgeon: Gavel Norleen, MD;  Location: [facility];  Service: Orthopedics;  Laterality: Right;   TOTAL KNEE REVISION Right 11/04/2019   Procedure: RIGHT TOTAL KNEE REVISION;  Surgeon: Gavel Norleen, MD;  Location: [facility];  Service: Orthopedics;  Laterality: Right;   TRIGGER FINGER RELEASE Right    Patient Active Problem List   Diagnosis Date Noted   Primary osteoarthritis of right hip 06/14/2023   Meralgia paraesthetica, right 06/10/2023   Spondylosis without myelopathy or radiculopathy, lumbar region 06/10/2023   Painful total knee replacement, right (HCC) 11/04/2019   Arthrofibrosis of knee joint, right 11/04/2019   S/P revision of total knee, right 11/04/2019   Primary osteoarthritis of right knee 03/31/2018   Hypertension 12/02/2011   Obesity (BMI 30-39.9) 12/02/2011    PCP: [facility]  REFERRING PROVIDER: Gavel Norleen, MD   REFERRING DIAG: S/P right hip trochanteric bursectomy and IT band release  THERAPY DIAG:  Other symptoms and signs involving the musculoskeletal system  Pain in right hip  Other abnormalities of gait and mobility  Muscle weakness (generalized)  Rationale for Evaluation and Treatment: Rehabilitation  ONSET DATE: 3 weeks ago  SUBJECTIVE:   SUBJECTIVE STATEMENT:  Sharp pains in right hip day after I saw you. Didn't last long. Pain 5.5  POOL ACCESS: member of [employer]    PERTINENT HISTORY: Hx R THA, R TKA, hx LBP, HTN  PAIN:  Are you having pain? Yes: NPRS scale: 5/10 Pain location: R hip, Rt groin and Rt distal lateral knee Pain description: aching, sharp Aggravating factors: ROM,  Relieving factors:  rest  PRECAUTIONS: None  WEIGHT BEARING RESTRICTIONS: No  FALLS:  Has patient fallen in last 6 months? No  PLOF: Independent  PATIENT GOALS:  a little bit of relief  OBJECTIVE: (objective measures from initial evaluation unless otherwise dated)  MRI 8/25 right hip  IMPRESSION: 1. Right total hip arthroplasty with susceptibility artifact partially obscuring the adjacent soft tissue and osseous structures. No periarticular fluid collection or osteolysis. 2. Large amount of fluid in the right greater trochanteric bursa consistent with bursitis. 3. Mild osteoarthritis of the left hip.  PATIENT SURVEYS:  LEFS  Extreme difficulty/unable (0), Quite a bit of difficulty (1), Moderate difficulty (2), Little difficulty (3), No difficulty (4) Survey date:  07/07/24  Any of your usual work, housework or school activities 1  2. Usual hobbies, recreational or sporting activities 1  3. Getting into/out of the bath 1  4. Walking between rooms 2  5. Putting on socks/shoes 1  6. Squatting  2  7. Lifting an object, like a bag of groceries from the floor 2  8. Performing light activities around your home 2  9. Performing heavy activities around your home 0  10. Getting into/out of a car 2  11. Walking 2 blocks 0  12. Walking 1 mile 0  13. Going up/down 10 stairs (1 flight) 1  14. Standing for 1 hour 1  15.  sitting for 1 hour 2  16. Running on even ground 0  17. Running on uneven ground 0  18. Making sharp turns while running fast 0  19. Hopping  0  20. Rolling over in bed 1  Score total:  19/80     COGNITION: Overall cognitive status: Within functional limits for tasks assessed     SENSATION: WFL  POSTURE: No Significant postural limitations  PALPATION: TTP R greater Troch   LOWER EXTREMITY ROM:  Active ROM Right eval Left eval  Hip flexion    Hip extension    Hip abduction    Hip adduction    Hip internal rotation    Hip external rotation    Knee flexion    Knee  extension    Ankle dorsiflexion    Ankle plantarflexion    Ankle inversion    Ankle eversion     (Blank rows = not tested) *= pain/symptoms  LOWER EXTREMITY MMT:  MMT Right eval Left eval  Hip flexion 4+ 5  Hip extension 4 4+  Hip abduction (sidelying) 4-   Hip adduction    Hip internal rotation    Hip external rotation    Knee flexion 5 5  Knee extension 5 5  Ankle dorsiflexion    Ankle plantarflexion    Ankle inversion    Ankle eversion     (Blank rows = not tested) *= pain/symptoms  FUNCTIONAL TESTS:  5 times sit to stand: 26.97 seconds  Stairs: 7 inch, step too pattern, uses LLE  GAIT: Distance walked: 100 feet  Assistive device utilized: None Level of assistance: Complete Independence Comments: antalgic on RLE with truncal lean to R   TODAY'S TREATMENT:  [facility] Adult PT Treatment:                                              08/02/24 Pt seen for aquatic therapy today.  Treatment took place in water  3.5-4.75 ft in depth at the [facility] pool. Temp of water  was 91.  Pt entered/exited the pool via stairs independently in step-to pattern with bil rail.  Manual therapy prior to entering pool: MFR to Rt iliopsoas  - unsupported walking forward/ backward/side stepping with long strides for hip flexor stretch - UE on wall: split squats x 10 each LE; single leg clams x 10 each;   - UE on yellow hand floats side lunge x 2 widths - plank on bench: hip extension alternating x 10; mountain climbers 2 x 5 - prone suspension using noodle : hip flex/ext; hip add/abd  Date:  07/26/2024 -PROM R hip -STM to proximal ihip flexors, HS -STM/roller to lateral HS  - [name] 2x10 - S/l clam 2x10 - Side stepping with BLKTB around ankles x5 laps at rail - Squats 2x15 -partial lunges x10ea - Modified thomas stretch with focus on hip flexors  - Prone  HS curls x20 Standing hip extension 2x20ea  Date:  07/23/2024 -STM to lateral HS  - Attempted SL Bridges with pain in groin - S/l clam 2x10 - Side stepping with BLKTB around ankles - Squats with BLKTB around knees - Discussion about compensations due to multiple surgeries.  - Modified thomas stretch with focus on hip flexors  - Prone HS curls with RTB resistance  07/20/24 Pt seen for aquatic therapy today.  Treatment took place in water  3.5-4.75 ft in depth at the [facility] pool. Temp of water  was 91.  Pt entered/exited the pool via stairs independently in step-to pattern with bil rail.  - Intro to aquatic therapy principles - unsupported walking forward/ backward (feels slightly better facing lap pool) - unsupported side stepping  - UE on wall:  single leg clams x 5 each (pain in Rt hip at end range ER); hip abdct/add crossing midline x 10 each ; hip flexion /extension x 10 each; relaxed squat - Rt quad stretch with ankle supported by hollow noodle x 30s x 2-> moving into hip ext in between reps - straddling noodle and one under arms behind back: cycling; hip abdct/add; cross country ski-all at slow pace  - at bench on deck: IASTM to Rt distal to mid quad to decrease fascial restrictions and improve mobility in RLE   8/29 PROM R hip  Roller to R quads STM to R TFL and lateral glute  Quad set 5 hold x15 STM to R gluteal mm/TFL in s/l with pillow between knees [name] 2x10 S/l clam 2x10 LAQ 2x10  8/27: PROM R hip  Roller to R [employer] 5 hold x15 STM to R gluteal mm/TFL in s/l with pillow between knees KTC with red physioball x20 [name] x10 (reviewed technique) Hooklying clam x10, added GTB x20 Reviewed quad/HS stretching Discussion of healing process, ice use, activity modifications, and HEP performance within pain limitations  07/07/24 [name] 2 x 10   Clam 2 x 10   PATIENT EDUCATION:  Education details: self massage techniques with ball  Person  educated: Patient Education method: Programmer, multimedia, Demonstration,  Education comprehension: verbalized understanding, returned demonstration, verbal cues required, and tactile cues required  HOME  EXERCISE PROGRAM: Access Code: [email or web address] URL: [email or web address] Date: 07/07/2024 Prepared by: Prentice Zaunegger  Exercises - Supine [name]  - 1 x daily - 7 x weekly - 3 sets - 10 reps - Clamshell (Mirrored)  - 1 x daily - 7 x weekly - 3 sets - 10 reps  ASSESSMENT:  CLINICAL IMPRESSION: Some increase in right iliopsoas pain after last session.  We reduced time spent with MFR to reduce but decided it may improve if repeated. Will re-assess at next session. MRI results indicate right bursitis.  Pt unaware he had bursa as was under impression it had been removed with last surgery.  He tolerates exercises well but does report some soreness in hip flex region. He reports compliance with Hep as instructed. Goals ongoing.    Eval: Patient a 58 y.o. y.o. male who was seen today for physical therapy evaluation and treatment for S/P right hip trochanteric bursectomy and IT band release. Patient presents with pain limited deficits in R hip strength, ROM, endurance, activity tolerance, and functional mobility with ADL. Patient is having to modify and restrict ADL as indicated by outcome measure score as well as subjective information and objective measures which is affecting overall participation. Patient will benefit from skilled physical therapy in order to improve function and reduce impairment.  OBJECTIVE IMPAIRMENTS: Abnormal gait, decreased activity tolerance, decreased balance, decreased endurance, decreased mobility, difficulty walking, decreased ROM, decreased strength, increased muscle spasms, impaired flexibility, improper body mechanics, and pain  ACTIVITY LIMITATIONS: lifting, bending, standing, squatting, stairs, transfers, locomotion level, and caring for others  PARTICIPATION  LIMITATIONS: meal prep, cleaning, laundry, shopping, community activity, occupation, and yard work  PERSONAL FACTORS: Fitness and 3+ comorbidities: Hx R THA, R TKA, hx LBP, HTN are also affecting patient's functional outcome.   REHAB POTENTIAL: Good  CLINICAL DECISION MAKING: Evolving/moderate complexity  EVALUATION COMPLEXITY: Moderate   GOALS: Goals reviewed with patient? Yes  SHORT TERM GOALS: Target date: 08/04/2024    Patient will be independent with HEP in order to improve functional outcomes. Baseline: Goal status: Met 08/03/23  2.  Patient will report at least 25% improvement in symptoms/functional status for improved quality of life. Baseline: Goal status: INITIAL    LONG TERM GOALS: Target date: 09/01/2024    Patient will report at least 75% improvement in symptoms for improved quality of life. Baseline:  Goal status: INITIAL  2.  Patient will improve LEFS  score by at least 15 points in order to indicate improved tolerance to activity. Baseline:  Goal status: INITIAL  3.  Patient will be able to navigate stairs with reciprocal pattern without compensation in order to demonstrate improved LE strength. Baseline:  Goal status: INITIAL  4. Patient will demonstrate grade of 5/5 MMT grade in all tested musculature as evidence of improved strength to assist with stair ambulation and gait. Baseline:  Goal status: INITIAL  5.  Patient will be able to complete 5x STS in under 15 seconds in order to reduce the risk of falls. Baseline:  Goal status: INITIAL     PLAN:  PT FREQUENCY: 2x/week  PT DURATION: 8 weeks  PLANNED INTERVENTIONS: 97164- PT Re-evaluation, 97110-Therapeutic exercises, 97530- Therapeutic activity, [id number]- Neuromuscular re-education, 97535- Self Care, [id number]- Manual therapy, [id number]- Gait training, [phone]- Orthotic Fit/training, [phone]- Canalith repositioning, [id number]- Aquatic Therapy, [phone]- Splinting, [phone]- Wound care (first 20 sq cm), 97598- Wound  care (each additional 20 sq cm)Patient/Family education, Balance training, Stair training, Taping, Dry Needling, Joint mobilization, Joint manipulation, Spinal manipulation, Spinal mobilization, Scar  mobilization, and DME instructions.  PLAN FOR NEXT SESSION: aquatics and land, hip strength, functional strength  Ronal Foots) Neely Kammerer MPT 08/02/24 12:42 PM [facility] [address] [place], [place], [address] Phone: [phone]   Fax:  [phone]

## 2024-08-04 ENCOUNTER — Ambulatory Visit (HOSPITAL_BASED_OUTPATIENT_CLINIC_OR_DEPARTMENT_OTHER): Payer: Self-pay | Admitting: Physical Therapy

## 2024-08-04 ENCOUNTER — Encounter (HOSPITAL_BASED_OUTPATIENT_CLINIC_OR_DEPARTMENT_OTHER): Payer: Self-pay | Admitting: Physical Therapy

## 2024-08-04 DIAGNOSIS — R2689 Other abnormalities of gait and mobility: Secondary | ICD-10-CM

## 2024-08-04 DIAGNOSIS — M6281 Muscle weakness (generalized): Secondary | ICD-10-CM

## 2024-08-04 DIAGNOSIS — M25551 Pain in right hip: Secondary | ICD-10-CM | POA: Diagnosis not present

## 2024-08-04 DIAGNOSIS — R29898 Other symptoms and signs involving the musculoskeletal system: Secondary | ICD-10-CM

## 2024-08-04 NOTE — Therapy (Addendum)
 OUTPATIENT PHYSICAL THERAPY LOWER EXTREMITY TREATMENT   Patient Name: Reginald [name] MRN: [id number] DOB:1966/08/22, 58 y.o., male Today's Date: 08/04/2024  END OF SESSION:  PT End of Session - 08/04/24 1431     Visit Number 9    Number of Visits 16    Authorization Type [employer]    Progress Note Due on Visit 10    PT Start Time 1433    PT Stop Time 1512    PT Time Calculation (min) 39 min    Activity Tolerance Patient tolerated treatment well    Behavior During Therapy WFL for tasks assessed/performed          Past Medical History:  Diagnosis Date   Arthritis    Gallstones    Hypertension    Past Surgical History:  Procedure Laterality Date   CHOLECYSTECTOMY  12/03/2011   Procedure: LAPAROSCOPIC CHOLECYSTECTOMY WITH INTRAOPERATIVE CHOLANGIOGRAM;  Surgeon: Camellia CHRISTELLA Blush, MD;  Location: [facility] OR;  Service: General;  Laterality: N/A;  laparoscopic cholecystectomy with intraoperative cholangiogram   CHONDROPLASTY Right 09/19/2015   Procedure: CHONDROPLASTY;  Surgeon: Norleen Gavel, MD;  Location: [facility];  Service: Orthopedics;  Laterality: Right;   ENDOVENOUS ABLATION SAPHENOUS VEIN W/ LASER Right 01/13/2019   endovenous laser ablation right greater saphenous vein and stab phlebectomy 10-20 incisions right leg by Lonni Blade MD    IR ABLATE LIVER CRYOABLATION  04/03/2020   IR RADIOLOGIST EVAL & MGMT  03/28/2020   KNEE ARTHROSCOPY WITH LATERAL MENISECTOMY Right 09/19/2015   Procedure: KNEE ARTHROSCOPY WITH PARTIAL LATERAL MENISECTOMY;  Surgeon: Norleen Gavel, MD;  Location: [facility];  Service: Orthopedics;  Laterality: Right;   KNEE ARTHROSCOPY WITH MEDIAL MENISECTOMY Right 09/19/2015   Procedure: KNEE ARTHROSCOPY WITH PARTIAL MEDIAL MENISECTOMY;  Surgeon: Norleen Gavel, MD;  Location: [facility];  Service: Orthopedics;  Laterality: Right;   ROTATOR CUFF REPAIR Left    with revision   spider bite     black widow  or brown recluse   TOE FUSION Left    TOTAL HIP ARTHROPLASTY Right 06/15/2023   Procedure: TOTAL HIP ARTHROPLASTY ANTERIOR APPROACH;  Surgeon: Gavel Norleen, MD;  Location: [facility];  Service: Orthopedics;  Laterality: Right;   TOTAL KNEE ARTHROPLASTY Right 03/31/2018   Procedure: RIGHT TOTAL KNEE ARTHROPLASTY;  Surgeon: Gavel Norleen, MD;  Location: [facility];  Service: Orthopedics;  Laterality: Right;   TOTAL KNEE REVISION Right 11/04/2019   Procedure: RIGHT TOTAL KNEE REVISION;  Surgeon: Gavel Norleen, MD;  Location: [facility];  Service: Orthopedics;  Laterality: Right;   TRIGGER FINGER RELEASE Right    Patient Active Problem List   Diagnosis Date Noted   Primary osteoarthritis of right hip 06/14/2023   Meralgia paraesthetica, right 06/10/2023   Spondylosis without myelopathy or radiculopathy, lumbar region 06/10/2023   Painful total knee replacement, right (HCC) 11/04/2019   Arthrofibrosis of knee joint, right 11/04/2019   S/P revision of total knee, right 11/04/2019   Primary osteoarthritis of right knee 03/31/2018   Hypertension 12/02/2011   Obesity (BMI 30-39.9) 12/02/2011    PCP: [facility]  REFERRING PROVIDER: Gavel Norleen, MD   REFERRING DIAG: S/P right hip trochanteric bursectomy and IT band release  THERAPY DIAG:  Other symptoms and signs involving the musculoskeletal system  Pain in right hip  Other abnormalities of gait and mobility  Muscle weakness (generalized)  Rationale for Evaluation and Treatment: Rehabilitation  ONSET DATE: 3 weeks ago  SUBJECTIVE:   SUBJECTIVE STATEMENT:  Pt reports that he is not too bad today. Reports 5.5/10 pain with no sharpness. Pt went to the doctor today and was told that there was no bursitis on MRI. Pt said the doctor told him it was related to post-op healing. He reports compliance with HEP as instructed.  PERTINENT HISTORY: Hx R THA, R TKA, hx LBP, HTN  PAIN:  Are you having pain? Yes: NPRS scale:  5/10 Pain location: R hip, Rt groin and Rt distal lateral knee Pain description: aching, sharp Aggravating factors: ROM,  Relieving factors: rest  PRECAUTIONS: None  WEIGHT BEARING RESTRICTIONS: No  FALLS:  Has patient fallen in last 6 months? No  PLOF: Independent  PATIENT GOALS:  a little bit of relief  OBJECTIVE: (objective measures from initial evaluation unless otherwise dated)  MRI 8/25 right hip  IMPRESSION: 1. Right total hip arthroplasty with susceptibility artifact partially obscuring the adjacent soft tissue and osseous structures. No periarticular fluid collection or osteolysis. 2. Large amount of fluid in the right greater trochanteric bursa consistent with bursitis. 3. Mild osteoarthritis of the left hip.  PATIENT SURVEYS:  LEFS  Extreme difficulty/unable (0), Quite a bit of difficulty (1), Moderate difficulty (2), Little difficulty (3), No difficulty (4) Survey date:  07/07/24  Any of your usual work, housework or school activities 1  2. Usual hobbies, recreational or sporting activities 1  3. Getting into/out of the bath 1  4. Walking between rooms 2  5. Putting on socks/shoes 1  6. Squatting  2  7. Lifting an object, like a bag of groceries from the floor 2  8. Performing light activities around your home 2  9. Performing heavy activities around your home 0  10. Getting into/out of a car 2  11. Walking 2 blocks 0  12. Walking 1 mile 0  13. Going up/down 10 stairs (1 flight) 1  14. Standing for 1 hour 1  15.  sitting for 1 hour 2  16. Running on even ground 0  17. Running on uneven ground 0  18. Making sharp turns while running fast 0  19. Hopping  0  20. Rolling over in bed 1  Score total:  19/80     COGNITION: Overall cognitive status: Within functional limits for tasks assessed     SENSATION: WFL  POSTURE: No Significant postural limitations  PALPATION: TTP R greater Troch   LOWER EXTREMITY ROM:  Active ROM Right eval Left eval   Hip flexion    Hip extension    Hip abduction    Hip adduction    Hip internal rotation    Hip external rotation    Knee flexion    Knee extension    Ankle dorsiflexion    Ankle plantarflexion    Ankle inversion    Ankle eversion     (Blank rows = not tested) *= pain/symptoms  LOWER EXTREMITY MMT:  MMT Right eval Left eval  Hip flexion 4+ 5  Hip extension 4 4+  Hip abduction (sidelying) 4-   Hip adduction    Hip internal rotation    Hip external rotation    Knee flexion 5 5  Knee extension 5 5  Ankle dorsiflexion    Ankle plantarflexion    Ankle inversion    Ankle eversion     (Blank rows = not tested) *= pain/symptoms  FUNCTIONAL TESTS:  5 times sit to stand: 26.97 seconds  Stairs: 7 inch, step too pattern, uses LLE  GAIT: Distance walked: 100 feet  Assistive device utilized: None Level of assistance: Complete Independence Comments: antalgic on RLE with truncal lean to R   TODAY'S TREATMENT:                                                                                                                               08/04/24 Manual Therapy: STM to right IT band, iliopsoas, and quad   Therapeutic Exercise:  1x10 clamshell with blue band  2x10 bridges   3x10 leg press 70# for 10 reps, 100# for 10 reps  3x30 seconds prone quad stretch with strap  Therapeutic Activity:  2x10 partial lunges  2x10 squats   08/02/24 Pt seen for aquatic therapy today.  Treatment took place in water  3.5-4.75 ft in depth at the [facility] pool. Temp of water  was 91.  Pt entered/exited the pool via stairs independently in step-to pattern with bil rail.  Manual therapy prior to entering pool: MFR to Rt iliopsoas  - unsupported walking forward/ backward/side stepping with long strides for hip flexor stretch - UE on wall: split squats x 10 each LE; single leg clams x 10 each;   - UE on yellow hand floats side lunge x 2 widths - plank on bench: hip extension alternating  x 10; mountain climbers 2 x 5 - prone suspension using noodle : hip flex/ext; hip add/abd  Date:  07/26/2024 -PROM R hip -STM to proximal ihip flexors, HS -STM/roller to lateral HS  - Bridge 2x10 - S/l clam 2x10 - Side stepping with BLKTB around ankles x5 laps at rail - Squats 2x15 -partial lunges x10ea - Modified thomas stretch with focus on hip flexors  - Prone HS curls x20 Standing hip extension 2x20ea  Date:  07/23/2024 -STM to lateral HS  - Attempted SL Bridges with pain in groin - S/l clam 2x10 - Side stepping with BLKTB around ankles - Squats with BLKTB around knees - Discussion about compensations due to multiple surgeries.  - Modified thomas stretch with focus on hip flexors  - Prone HS curls with RTB resistance   PATIENT EDUCATION:  Education details: self massage techniques with ball  Person educated: Patient Education method: Explanation, Demonstration,  Education comprehension: verbalized understanding, returned demonstration, verbal cues required, and tactile cues required  HOME EXERCISE PROGRAM: Access Code: [email or web address] URL: [email or web address] Date: 07/07/2024 Prepared by: Prentice Zaunegger  Exercises - Supine Bridge  - 1 x daily - 7 x weekly - 3 sets - 10 reps - Clamshell (Mirrored)  - 1 x daily - 7 x weekly - 3 sets - 10 reps  ASSESSMENT:  CLINICAL IMPRESSION: Increase in right knee pain and right iliopsoas tightness throughout session. Tightness reduces with manual therapy interventions performed. He tolerates exercises well but does report some soreness in hip flexor region and pain in lateral right knee. Pt required verbal cueing for depth on leg press and squatting. Pt is a member at the gym and he questions his ability to independently  perform exercises. Discussed returning to gym routine and modifying as needed. 5.5/10 pain at the end of session. Continue to progress patient in gym routine at next session.     Eval: Patient a 58  y.o. y.o. male who was seen today for physical therapy evaluation and treatment for S/P right hip trochanteric bursectomy and IT band release. Patient presents with pain limited deficits in R hip strength, ROM, endurance, activity tolerance, and functional mobility with ADL. Patient is having to modify and restrict ADL as indicated by outcome measure score as well as subjective information and objective measures which is affecting overall participation. Patient will benefit from skilled physical therapy in order to improve function and reduce impairment.  OBJECTIVE IMPAIRMENTS: Abnormal gait, decreased activity tolerance, decreased balance, decreased endurance, decreased mobility, difficulty walking, decreased ROM, decreased strength, increased muscle spasms, impaired flexibility, improper body mechanics, and pain  ACTIVITY LIMITATIONS: lifting, bending, standing, squatting, stairs, transfers, locomotion level, and caring for others  PARTICIPATION LIMITATIONS: meal prep, cleaning, laundry, shopping, community activity, occupation, and yard work  PERSONAL FACTORS: Fitness and 3+ comorbidities: Hx R THA, R TKA, hx LBP, HTN are also affecting patient's functional outcome.   REHAB POTENTIAL: Good  CLINICAL DECISION MAKING: Evolving/moderate complexity  EVALUATION COMPLEXITY: Moderate   GOALS: Goals reviewed with patient? Yes  SHORT TERM GOALS: Target date: 08/04/2024    Patient will be independent with HEP in order to improve functional outcomes. Baseline: Goal status: Met 08/03/23  2.  Patient will report at least 25% improvement in symptoms/functional status for improved quality of life. Baseline: Goal status: INITIAL    LONG TERM GOALS: Target date: 09/01/2024    Patient will report at least 75% improvement in symptoms for improved quality of life. Baseline:  Goal status: INITIAL  2.  Patient will improve LEFS  score by at least 15 points in order to indicate improved tolerance  to activity. Baseline:  Goal status: INITIAL  3.  Patient will be able to navigate stairs with reciprocal pattern without compensation in order to demonstrate improved LE strength. Baseline:  Goal status: INITIAL  4. Patient will demonstrate grade of 5/5 MMT grade in all tested musculature as evidence of improved strength to assist with stair ambulation and gait. Baseline:  Goal status: INITIAL  5.  Patient will be able to complete 5x STS in under 15 seconds in order to reduce the risk of falls. Baseline:  Goal status: INITIAL     PLAN:  PT FREQUENCY: 2x/week  PT DURATION: 8 weeks  PLANNED INTERVENTIONS: 97164- PT Re-evaluation, 97110-Therapeutic exercises, 97530- Therapeutic activity, 97112- Neuromuscular re-education, 97535- Self Care, [id number]- Manual therapy, [phone]- Gait training, [phone]- Orthotic Fit/training, [phone]- Canalith repositioning, [id number]- Aquatic Therapy, [phone]- Splinting, [phone]- Wound care (first 20 sq cm), 97598- Wound care (each additional 20 sq cm)Patient/Family education, Balance training, Stair training, Taping, Dry Needling, Joint mobilization, Joint manipulation, Spinal manipulation, Spinal mobilization, Scar mobilization, and DME instructions.  PLAN FOR NEXT SESSION: aquatics and land, hip strength, functional strength   Lili Finder, SPT   This entire session was performed under direct supervision and direction of a licensed Estate agent . I have personally read, edited and approve of the note as written.  3:57 PM, 08/04/24 Prentice CANDIE Stains PT, DPT Physical Therapist at [facility]

## 2024-08-15 DIAGNOSIS — Z79899 Other long term (current) drug therapy: Secondary | ICD-10-CM | POA: Diagnosis not present

## 2024-08-15 DIAGNOSIS — M25561 Pain in right knee: Secondary | ICD-10-CM | POA: Diagnosis not present

## 2024-08-15 DIAGNOSIS — I10 Essential (primary) hypertension: Secondary | ICD-10-CM | POA: Diagnosis not present

## 2024-08-15 DIAGNOSIS — G8929 Other chronic pain: Secondary | ICD-10-CM | POA: Diagnosis not present

## 2024-08-16 ENCOUNTER — Encounter (HOSPITAL_BASED_OUTPATIENT_CLINIC_OR_DEPARTMENT_OTHER): Payer: Self-pay | Admitting: Physical Therapy

## 2024-08-16 ENCOUNTER — Ambulatory Visit (HOSPITAL_BASED_OUTPATIENT_CLINIC_OR_DEPARTMENT_OTHER): Admitting: Physical Therapy

## 2024-08-16 DIAGNOSIS — M25551 Pain in right hip: Secondary | ICD-10-CM

## 2024-08-16 DIAGNOSIS — R29898 Other symptoms and signs involving the musculoskeletal system: Secondary | ICD-10-CM

## 2024-08-16 DIAGNOSIS — R2689 Other abnormalities of gait and mobility: Secondary | ICD-10-CM

## 2024-08-16 DIAGNOSIS — M6281 Muscle weakness (generalized): Secondary | ICD-10-CM

## 2024-08-16 NOTE — Therapy (Addendum)
 OUTPATIENT PHYSICAL THERAPY LOWER EXTREMITY TREATMENT Progress Note Reporting Period 07/07/24 to 08/16/25  See note below for Objective Data and Assessment of Progress/Goals.      Patient Name: Reginald [name] MRN: [id number] DOB:1966-11-16, 58 y.o., male Today's Date: 08/16/2024  END OF SESSION:  PT End of Session - 08/16/24 0918     Visit Number 10    Number of Visits 16    Authorization Type [employer]    Progress Note Due on Visit 10    PT Start Time 0848    PT Stop Time 0927    PT Time Calculation (min) 39 min    Activity Tolerance Patient tolerated treatment well    Behavior During Therapy [facility] for tasks assessed/performed           Past Medical History:  Diagnosis Date   Arthritis    Gallstones    Hypertension    Past Surgical History:  Procedure Laterality Date   CHOLECYSTECTOMY  12/03/2011   Procedure: LAPAROSCOPIC CHOLECYSTECTOMY WITH INTRAOPERATIVE CHOLANGIOGRAM;  Surgeon: Camellia CHRISTELLA Blush, MD;  Location: [facility] OR;  Service: General;  Laterality: N/A;  laparoscopic cholecystectomy with intraoperative cholangiogram   CHONDROPLASTY Right 09/19/2015   Procedure: CHONDROPLASTY;  Surgeon: Norleen Gavel, MD;  Location: [facility];  Service: Orthopedics;  Laterality: Right;   ENDOVENOUS ABLATION SAPHENOUS VEIN W/ LASER Right 01/13/2019   endovenous laser ablation right greater saphenous vein and stab phlebectomy 10-20 incisions right leg by Lonni Blade MD    IR ABLATE LIVER CRYOABLATION  04/03/2020   IR RADIOLOGIST EVAL & MGMT  03/28/2020   KNEE ARTHROSCOPY WITH LATERAL MENISECTOMY Right 09/19/2015   Procedure: KNEE ARTHROSCOPY WITH PARTIAL LATERAL MENISECTOMY;  Surgeon: Norleen Gavel, MD;  Location: [facility];  Service: Orthopedics;  Laterality: Right;   KNEE ARTHROSCOPY WITH MEDIAL MENISECTOMY Right 09/19/2015   Procedure: KNEE ARTHROSCOPY WITH PARTIAL MEDIAL MENISECTOMY;  Surgeon: Norleen Gavel, MD;  Location: [facility];  Service: Orthopedics;  Laterality: Right;   ROTATOR CUFF REPAIR Left    with revision   spider bite     black widow or brown recluse   TOE FUSION Left    TOTAL HIP ARTHROPLASTY Right 06/15/2023   Procedure: TOTAL HIP ARTHROPLASTY ANTERIOR APPROACH;  Surgeon: Gavel Norleen, MD;  Location: [facility];  Service: Orthopedics;  Laterality: Right;   TOTAL KNEE ARTHROPLASTY Right 03/31/2018   Procedure: RIGHT TOTAL KNEE ARTHROPLASTY;  Surgeon: Gavel Norleen, MD;  Location: [facility];  Service: Orthopedics;  Laterality: Right;   TOTAL KNEE REVISION Right 11/04/2019   Procedure: RIGHT TOTAL KNEE REVISION;  Surgeon: Gavel Norleen, MD;  Location: [facility];  Service: Orthopedics;  Laterality: Right;   TRIGGER FINGER RELEASE Right    Patient Active Problem List   Diagnosis Date Noted   Primary osteoarthritis of right hip 06/14/2023   Meralgia paraesthetica, right 06/10/2023   Spondylosis without myelopathy or radiculopathy, lumbar region 06/10/2023   Painful total knee replacement, right 11/04/2019   Arthrofibrosis of knee joint, right 11/04/2019   S/P revision of total knee, right 11/04/2019   Primary osteoarthritis of right knee 03/31/2018   Hypertension 12/02/2011   Obesity (BMI 30-39.9) 12/02/2011    PCP: [facility]  REFERRING PROVIDER: Gavel Norleen, MD   REFERRING DIAG: S/P right hip trochanteric bursectomy and IT band release  THERAPY DIAG:  Other symptoms and signs involving the musculoskeletal system  Pain in right hip  Other abnormalities of gait and mobility  Muscle  weakness (generalized)  Rationale for Evaluation and Treatment: Rehabilitation  ONSET DATE: 3 weeks ago  SUBJECTIVE:   SUBJECTIVE STATEMENT: Pt reports pain 5.5. He states pain doe not seem to get better or worse with or without movement/exercise.  PERTINENT HISTORY: Hx R THA, R TKA, hx LBP, HTN  PAIN:  Are you having pain? Yes: NPRS scale: 5/10 Pain location: R hip, Rt groin  and Rt distal lateral knee Pain description: aching, sharp Aggravating factors: ROM,  Relieving factors: rest  PRECAUTIONS: None  WEIGHT BEARING RESTRICTIONS: No  FALLS:  Has patient fallen in last 6 months? No  PLOF: Independent  PATIENT GOALS:  a little bit of relief  OBJECTIVE: (objective measures from initial evaluation unless otherwise dated)  MRI 8/25 right hip  IMPRESSION: 1. Right total hip arthroplasty with susceptibility artifact partially obscuring the adjacent soft tissue and osseous structures. No periarticular fluid collection or osteolysis. 2. Large amount of fluid in the right greater trochanteric bursa consistent with bursitis. 3. Mild osteoarthritis of the left hip.  PATIENT SURVEYS:  LEFS  Extreme difficulty/unable (0), Quite a bit of difficulty (1), Moderate difficulty (2), Little difficulty (3), No difficulty (4) Survey date:  07/07/24  Any of your usual work, housework or school activities 1  2. Usual hobbies, recreational or sporting activities 1  3. Getting into/out of the bath 1  4. Walking between rooms 2  5. Putting on socks/shoes 1  6. Squatting  2  7. Lifting an object, like a bag of groceries from the floor 2  8. Performing light activities around your home 2  9. Performing heavy activities around your home 0  10. Getting into/out of a car 2  11. Walking 2 blocks 0  12. Walking 1 mile 0  13. Going up/down 10 stairs (1 flight) 1  14. Standing for 1 hour 1  15.  sitting for 1 hour 2  16. Running on even ground 0  17. Running on uneven ground 0  18. Making sharp turns while running fast 0  19. Hopping  0  20. Rolling over in bed 1  Score total:  19/80     COGNITION: Overall cognitive status: Within functional limits for tasks assessed     SENSATION: WFL  POSTURE: No Significant postural limitations  PALPATION: TTP R greater Troch   LOWER EXTREMITY ROM:  Active ROM Right eval Left eval  Hip flexion    Hip extension     Hip abduction    Hip adduction    Hip internal rotation    Hip external rotation    Knee flexion    Knee extension    Ankle dorsiflexion    Ankle plantarflexion    Ankle inversion    Ankle eversion     (Blank rows = not tested) *= pain/symptoms  LOWER EXTREMITY MMT:  MMT Right eval Left eval  Hip flexion 4+ 5  Hip extension 4 4+  Hip abduction (sidelying) 4-   Hip adduction    Hip internal rotation    Hip external rotation    Knee flexion 5 5  Knee extension 5 5  Ankle dorsiflexion    Ankle plantarflexion    Ankle inversion    Ankle eversion     (Blank rows = not tested) *= pain/symptoms  FUNCTIONAL TESTS:  5 times sit to stand: 26.97 seconds  Stairs: 7 inch, step too pattern, uses LLE  GAIT: Distance walked: 100 feet  Assistive device utilized: None Level of assistance: Complete Independence  Comments: antalgic on RLE with truncal lean to R   TODAY'S TREATMENT:                                                                                                                               08/16/24 Pt seen for aquatic therapy today.  Treatment took place in water  3.5-4.75 ft in depth at the [facility] pool. Temp of water  was 91.  Pt entered/exited the pool via stairs independently in step-to pattern with bil rail.   - unsupported walking forward/ backward/side stepping  - step ups leading R/L x 10 unsupported-> runners step ups x 10.  VC and demonstration for execution - Forward split squats 2 widths yellow HB  - UE on yellow hand floats side lunge x 4 widths. Wide squat increased stretch -figure 4 stretch at quad stretch at steps -solid noodle stomp rle  hip in neutral 2 x 10 then externally rotated x10 -seated on 3rd step flutter kicking/hip flex/ext; hip add/abd 3 sets 20 slow/20 fast.   08/04/24 Manual Therapy: STM to right IT band, iliopsoas, and quad   Therapeutic Exercise:  1x10 clamshell with blue band  2x10 bridges   3x10 leg press 70# for  10 reps, 100# for 10 reps  3x30 seconds prone quad stretch with strap  Therapeutic Activity:  2x10 partial lunges  2x10 squats   08/02/24 Pt seen for aquatic therapy today.  Treatment took place in water  3.5-4.75 ft in depth at the [facility] pool. Temp of water  was 91.  Pt entered/exited the pool via stairs independently in step-to pattern with bil rail.  Manual therapy prior to entering pool: MFR to Rt iliopsoas  - unsupported walking forward/ backward/side stepping with long strides for hip flexor stretch - UE on wall: split squats x 10 each LE; single leg clams x 10 each;   - UE on yellow hand floats side lunge x 2 widths - plank on bench: hip extension alternating x 10; mountain climbers 2 x 5 - prone suspension using noodle : hip flex/ext; hip add/abd  Date:  07/26/2024 -PROM R hip -STM to proximal ihip flexors, HS -STM/roller to lateral HS  - Bridge 2x10 - S/l clam 2x10 - Side stepping with BLKTB around ankles x5 laps at rail - Squats 2x15 -partial lunges x10ea - Modified thomas stretch with focus on hip flexors  - Prone HS curls x20 Standing hip extension 2x20ea  Date:  07/23/2024 -STM to lateral HS  - Attempted SL Bridges with pain in groin - S/l clam 2x10 - Side stepping with BLKTB around ankles - Squats with BLKTB around knees - Discussion about compensations due to multiple surgeries.  - Modified thomas stretch with focus on hip flexors  - Prone HS curls with RTB resistance   PATIENT EDUCATION:  Education details: self massage techniques with ball  Person educated: Patient Education method: Explanation, Demonstration,  Education comprehension: verbalized understanding, returned demonstration, verbal cues required, and  tactile cues required  HOME EXERCISE PROGRAM: Access Code: [email or web address] URL: [email or web address] Date: 07/07/2024 Prepared by: Prentice Zaunegger  Exercises - Supine Bridge  - 1 x daily - 7 x weekly - 3 sets - 10  reps - Clamshell (Mirrored)  - 1 x daily - 7 x weekly - 3 sets - 10 reps  ASSESSMENT:  CLINICAL IMPRESSION: Good toleration of progressed program increasing strength with added resistance. Able to get very good adductor stretch with side lunge. He demonstrates improving coordination with exercises as well as toleration.  Worked some aerobic capacity challenge in. No change in pain throughout session. Continues right at 5 to 5.5/10.  Goals ongoing        Eval: Patient a 58 y.o. y.o. male who was seen today for physical therapy evaluation and treatment for S/P right hip trochanteric bursectomy and IT band release. Patient presents with pain limited deficits in R hip strength, ROM, endurance, activity tolerance, and functional mobility with ADL. Patient is having to modify and restrict ADL as indicated by outcome measure score as well as subjective information and objective measures which is affecting overall participation. Patient will benefit from skilled physical therapy in order to improve function and reduce impairment.  OBJECTIVE IMPAIRMENTS: Abnormal gait, decreased activity tolerance, decreased balance, decreased endurance, decreased mobility, difficulty walking, decreased ROM, decreased strength, increased muscle spasms, impaired flexibility, improper body mechanics, and pain  ACTIVITY LIMITATIONS: lifting, bending, standing, squatting, stairs, transfers, locomotion level, and caring for others  PARTICIPATION LIMITATIONS: meal prep, cleaning, laundry, shopping, community activity, occupation, and yard work  PERSONAL FACTORS: Fitness and 3+ comorbidities: Hx R THA, R TKA, hx LBP, HTN are also affecting patient's functional outcome.   REHAB POTENTIAL: Good  CLINICAL DECISION MAKING: Evolving/moderate complexity  EVALUATION COMPLEXITY: Moderate   GOALS: Goals reviewed with patient? Yes  SHORT TERM GOALS: Target date: 08/04/2024    Patient will be independent with HEP in order  to improve functional outcomes. Baseline: Goal status: Met 08/03/23  2.  Patient will report at least 25% improvement in symptoms/functional status for improved quality of life. Baseline: Goal status: INITIAL    LONG TERM GOALS: Target date: 09/01/2024    Patient will report at least 75% improvement in symptoms for improved quality of life. Baseline:  Goal status: INITIAL  2.  Patient will improve LEFS  score by at least 15 points in order to indicate improved tolerance to activity. Baseline:  Goal status: INITIAL  3.  Patient will be able to navigate stairs with reciprocal pattern without compensation in order to demonstrate improved LE strength. Baseline:  Goal status: INITIAL  4. Patient will demonstrate grade of 5/5 MMT grade in all tested musculature as evidence of improved strength to assist with stair ambulation and gait. Baseline:  Goal status: INITIAL  5.  Patient will be able to complete 5x STS in under 15 seconds in order to reduce the risk of falls. Baseline:  Goal status: INITIAL     PLAN:  PT FREQUENCY: 2x/week  PT DURATION: 8 weeks  PLANNED INTERVENTIONS: 97164- PT Re-evaluation, 97110-Therapeutic exercises, 97530- Therapeutic activity, 97112- Neuromuscular re-education, 97535- Self Care, [id number]- Manual therapy, [phone]- Gait training, [phone]- Orthotic Fit/training, [phone]- Canalith repositioning, [id number]- Aquatic Therapy, [phone]- Splinting, [phone]- Wound care (first 20 sq cm), 97598- Wound care (each additional 20 sq cm)Patient/Family education, Balance training, Stair training, Taping, Dry Needling, Joint mobilization, Joint manipulation, Spinal manipulation, Spinal mobilization, Scar mobilization, and DME instructions.  PLAN FOR NEXT SESSION: aquatics and land, hip strength,  functional strength   Ronal Foots) Sherilee Smotherman MPT 08/16/24 9:31 AM [facility] [address] [place], [place], [address] Phone:  [phone]   Fax:  [phone]  Addend Ronal Foots) Janeil Schexnayder MPT 08/24/24 10:19 AM [facility] [address] [place], [place], [address] Phone: [phone]   Fax:  [phone]

## 2024-08-18 DIAGNOSIS — M0579 Rheumatoid arthritis with rheumatoid factor of multiple sites without organ or systems involvement: Secondary | ICD-10-CM | POA: Diagnosis not present

## 2024-08-18 DIAGNOSIS — M79642 Pain in left hand: Secondary | ICD-10-CM | POA: Diagnosis not present

## 2024-08-18 DIAGNOSIS — R5382 Chronic fatigue, unspecified: Secondary | ICD-10-CM | POA: Diagnosis not present

## 2024-08-18 DIAGNOSIS — Z6838 Body mass index (BMI) 38.0-38.9, adult: Secondary | ICD-10-CM | POA: Diagnosis not present

## 2024-08-18 DIAGNOSIS — E669 Obesity, unspecified: Secondary | ICD-10-CM | POA: Diagnosis not present

## 2024-08-18 DIAGNOSIS — M79641 Pain in right hand: Secondary | ICD-10-CM | POA: Diagnosis not present

## 2024-08-18 DIAGNOSIS — M1991 Primary osteoarthritis, unspecified site: Secondary | ICD-10-CM | POA: Diagnosis not present

## 2024-08-19 ENCOUNTER — Ambulatory Visit (HOSPITAL_BASED_OUTPATIENT_CLINIC_OR_DEPARTMENT_OTHER): Attending: Orthopedic Surgery

## 2024-08-19 ENCOUNTER — Encounter (HOSPITAL_BASED_OUTPATIENT_CLINIC_OR_DEPARTMENT_OTHER): Payer: Self-pay

## 2024-08-19 DIAGNOSIS — R2689 Other abnormalities of gait and mobility: Secondary | ICD-10-CM | POA: Insufficient documentation

## 2024-08-19 DIAGNOSIS — R29898 Other symptoms and signs involving the musculoskeletal system: Secondary | ICD-10-CM | POA: Insufficient documentation

## 2024-08-19 DIAGNOSIS — M25551 Pain in right hip: Secondary | ICD-10-CM | POA: Diagnosis not present

## 2024-08-19 DIAGNOSIS — M6281 Muscle weakness (generalized): Secondary | ICD-10-CM | POA: Insufficient documentation

## 2024-08-19 NOTE — Therapy (Signed)
 OUTPATIENT PHYSICAL THERAPY LOWER EXTREMITY TREATMENT   Patient Name: Reginald [name] MRN: [id number] DOB:1966-02-20, 58 y.o., male Today's Date: 08/19/2024  END OF SESSION:  PT End of Session - 08/19/24 0841     Visit Number 11    Number of Visits 16    Date for Recertification  09/01/24    Authorization Type [employer]    Progress Note Due on Visit 10    PT Start Time 0848    PT Stop Time 0931    PT Time Calculation (min) 43 min    Activity Tolerance Patient tolerated treatment well    Behavior During Therapy [facility] for tasks assessed/performed            Past Medical History:  Diagnosis Date   Arthritis    Gallstones    Hypertension    Past Surgical History:  Procedure Laterality Date   CHOLECYSTECTOMY  12/03/2011   Procedure: LAPAROSCOPIC CHOLECYSTECTOMY WITH INTRAOPERATIVE CHOLANGIOGRAM;  Surgeon: Camellia CHRISTELLA Blush, MD;  Location: [facility] OR;  Service: General;  Laterality: N/A;  laparoscopic cholecystectomy with intraoperative cholangiogram   CHONDROPLASTY Right 09/19/2015   Procedure: CHONDROPLASTY;  Surgeon: Norleen Gavel, MD;  Location: [facility];  Service: Orthopedics;  Laterality: Right;   ENDOVENOUS ABLATION SAPHENOUS VEIN W/ LASER Right 01/13/2019   endovenous laser ablation right greater saphenous vein and stab phlebectomy 10-20 incisions right leg by Lonni Blade MD    IR ABLATE LIVER CRYOABLATION  04/03/2020   IR RADIOLOGIST EVAL & MGMT  03/28/2020   KNEE ARTHROSCOPY WITH LATERAL MENISECTOMY Right 09/19/2015   Procedure: KNEE ARTHROSCOPY WITH PARTIAL LATERAL MENISECTOMY;  Surgeon: Norleen Gavel, MD;  Location: [facility];  Service: Orthopedics;  Laterality: Right;   KNEE ARTHROSCOPY WITH MEDIAL MENISECTOMY Right 09/19/2015   Procedure: KNEE ARTHROSCOPY WITH PARTIAL MEDIAL MENISECTOMY;  Surgeon: Norleen Gavel, MD;  Location: [facility];  Service: Orthopedics;  Laterality: Right;   ROTATOR CUFF REPAIR Left     with revision   spider bite     black widow or brown recluse   TOE FUSION Left    TOTAL HIP ARTHROPLASTY Right 06/15/2023   Procedure: TOTAL HIP ARTHROPLASTY ANTERIOR APPROACH;  Surgeon: Gavel Norleen, MD;  Location: [facility];  Service: Orthopedics;  Laterality: Right;   TOTAL KNEE ARTHROPLASTY Right 03/31/2018   Procedure: RIGHT TOTAL KNEE ARTHROPLASTY;  Surgeon: Gavel Norleen, MD;  Location: [facility];  Service: Orthopedics;  Laterality: Right;   TOTAL KNEE REVISION Right 11/04/2019   Procedure: RIGHT TOTAL KNEE REVISION;  Surgeon: Gavel Norleen, MD;  Location: [facility];  Service: Orthopedics;  Laterality: Right;   TRIGGER FINGER RELEASE Right    Patient Active Problem List   Diagnosis Date Noted   Primary osteoarthritis of right hip 06/14/2023   Meralgia paraesthetica, right 06/10/2023   Spondylosis without myelopathy or radiculopathy, lumbar region 06/10/2023   Painful total knee replacement, right 11/04/2019   Arthrofibrosis of knee joint, right 11/04/2019   S/P revision of total knee, right 11/04/2019   Primary osteoarthritis of right knee 03/31/2018   Hypertension 12/02/2011   Obesity (BMI 30-39.9) 12/02/2011    PCP: [facility]  REFERRING PROVIDER: Gavel Norleen, MD   REFERRING DIAG: S/P right hip trochanteric bursectomy and IT band release  THERAPY DIAG:  Other symptoms and signs involving the musculoskeletal system  Pain in right hip  Other abnormalities of gait and mobility  Muscle weakness (generalized)  Rationale for Evaluation and Treatment: Rehabilitation  ONSET DATE:  3 weeks ago  SUBJECTIVE:   SUBJECTIVE STATEMENT: Pt reports 4.5/10 pain in hip and knee.   PERTINENT HISTORY: Hx R THA, R TKA, hx LBP, HTN  PAIN:  Are you having pain? Yes: NPRS scale: 4.5/10 Pain location: R hip, Rt groin and Rt distal lateral knee Pain description: aching, sharp Aggravating factors: ROM,  Relieving factors: rest  PRECAUTIONS: None  WEIGHT BEARING  RESTRICTIONS: No  FALLS:  Has patient fallen in last 6 months? No  PLOF: Independent  PATIENT GOALS:  a little bit of relief  OBJECTIVE: (objective measures from initial evaluation unless otherwise dated)  MRI 8/25 right hip  IMPRESSION: 1. Right total hip arthroplasty with susceptibility artifact partially obscuring the adjacent soft tissue and osseous structures. No periarticular fluid collection or osteolysis. 2. Large amount of fluid in the right greater trochanteric bursa consistent with bursitis. 3. Mild osteoarthritis of the left hip.  PATIENT SURVEYS:  LEFS  Extreme difficulty/unable (0), Quite a bit of difficulty (1), Moderate difficulty (2), Little difficulty (3), No difficulty (4) Survey date:  07/07/24  Any of your usual work, housework or school activities 1  2. Usual hobbies, recreational or sporting activities 1  3. Getting into/out of the bath 1  4. Walking between rooms 2  5. Putting on socks/shoes 1  6. Squatting  2  7. Lifting an object, like a bag of groceries from the floor 2  8. Performing light activities around your home 2  9. Performing heavy activities around your home 0  10. Getting into/out of a car 2  11. Walking 2 blocks 0  12. Walking 1 mile 0  13. Going up/down 10 stairs (1 flight) 1  14. Standing for 1 hour 1  15.  sitting for 1 hour 2  16. Running on even ground 0  17. Running on uneven ground 0  18. Making sharp turns while running fast 0  19. Hopping  0  20. Rolling over in bed 1  Score total:  19/80     COGNITION: Overall cognitive status: Within functional limits for tasks assessed     SENSATION: WFL  POSTURE: No Significant postural limitations  PALPATION: TTP R greater Troch   LOWER EXTREMITY ROM:  Active ROM Right eval Left eval  Hip flexion    Hip extension    Hip abduction    Hip adduction    Hip internal rotation    Hip external rotation    Knee flexion    Knee extension    Ankle dorsiflexion    Ankle  plantarflexion    Ankle inversion    Ankle eversion     (Blank rows = not tested) *= pain/symptoms  LOWER EXTREMITY MMT:  MMT Right eval Left eval  Hip flexion 4+ 5  Hip extension 4 4+  Hip abduction (sidelying) 4-   Hip adduction    Hip internal rotation    Hip external rotation    Knee flexion 5 5  Knee extension 5 5  Ankle dorsiflexion    Ankle plantarflexion    Ankle inversion    Ankle eversion     (Blank rows = not tested) *= pain/symptoms  FUNCTIONAL TESTS:  5 times sit to stand: 26.97 seconds  Stairs: 7 inch, step too pattern, uses LLE  GAIT: Distance walked: 100 feet  Assistive device utilized: None Level of assistance: Complete Independence Comments: antalgic on RLE with truncal lean to R   TODAY'S TREATMENT:  08/19/24  Nu step x47min L5  Standing hip abduction/extension 2x10ea/bil PROM R hip 1x10 clamshell with blue band  3x10 bridges   3x10 leg press 140lbs 3x30 seconds prone quad stretch with strap 2x10 partial lunges  2x10 squats    08/16/24 Pt seen for aquatic therapy today.  Treatment took place in water  3.5-4.75 ft in depth at the [facility] pool. Temp of water  was 91.  Pt entered/exited the pool via stairs independently in step-to pattern with bil rail.   - unsupported walking forward/ backward/side stepping  - step ups leading R/L x 10 unsupported-> runners step ups x 10.  VC and demonstration for execution - Forward split squats 2 widths yellow HB  - UE on yellow hand floats side lunge x 4 widths. Wide squat increased stretch -figure 4 stretch at quad stretch at steps -solid noodle stomp rle  hip in neutral 2 x 10 then externally rotated x10 -seated on 3rd step flutter kicking/hip flex/ext; hip add/abd 3 sets 20 slow/20 fast.   08/04/24 Manual Therapy: STM to right IT band, iliopsoas, and quad    Therapeutic Exercise:  1x10 clamshell with blue band  2x10 bridges   3x10 leg press 70# for 10 reps, 100# for 10 reps  3x30 seconds prone quad stretch with strap  Therapeutic Activity:  2x10 partial lunges  2x10 squats   08/02/24 Pt seen for aquatic therapy today.  Treatment took place in water  3.5-4.75 ft in depth at the [facility] pool. Temp of water  was 91.  Pt entered/exited the pool via stairs independently in step-to pattern with bil rail.  Manual therapy prior to entering pool: MFR to Rt iliopsoas  - unsupported walking forward/ backward/side stepping with long strides for hip flexor stretch - UE on wall: split squats x 10 each LE; single leg clams x 10 each;   - UE on yellow hand floats side lunge x 2 widths - plank on bench: hip extension alternating x 10; mountain climbers 2 x 5 - prone suspension using noodle : hip flex/ext; hip add/abd  Date:  07/26/2024 -PROM R hip -STM to proximal ihip flexors, HS -STM/roller to lateral HS  - Bridge 2x10 - S/l clam 2x10 - Side stepping with BLKTB around ankles x5 laps at rail - Squats 2x15 -partial lunges x10ea - Modified thomas stretch with focus on hip flexors  - Prone HS curls x20 Standing hip extension 2x20ea  Date:  07/23/2024 -STM to lateral HS  - Attempted SL Bridges with pain in groin - S/l clam 2x10 - Side stepping with BLKTB around ankles - Squats with BLKTB around knees - Discussion about compensations due to multiple surgeries.  - Modified thomas stretch with focus on hip flexors  - Prone HS curls with RTB resistance   PATIENT EDUCATION:  Education details: self massage techniques with ball  Person educated: Patient Education method: Explanation, Demonstration,  Education comprehension: verbalized understanding, returned demonstration, verbal cues required, and tactile cues required  HOME EXERCISE PROGRAM: Access Code: [email or web address] URL: [email or web address] Date:  07/07/2024 Prepared by: Prentice Zaunegger  Exercises - Supine Bridge  - 1 x daily - 7 x weekly - 3 sets - 10 reps - Clamshell (Mirrored)  - 1 x daily - 7 x weekly - 3 sets - 10 reps  ASSESSMENT:  CLINICAL IMPRESSION: Pt able to perform nu-step today with good tolerance. He has improved tolerance for exercise, but does mask pain level, so monitored throughout session. He was able to complete increased resistance on leg  press today. Will continue to progress strengthening and ROM as tolerated. Pt has MD f/u next Friday.        Eval: Patient a 58 y.o. y.o. male who was seen today for physical therapy evaluation and treatment for S/P right hip trochanteric bursectomy and IT band release. Patient presents with pain limited deficits in R hip strength, ROM, endurance, activity tolerance, and functional mobility with ADL. Patient is having to modify and restrict ADL as indicated by outcome measure score as well as subjective information and objective measures which is affecting overall participation. Patient will benefit from skilled physical therapy in order to improve function and reduce impairment.  OBJECTIVE IMPAIRMENTS: Abnormal gait, decreased activity tolerance, decreased balance, decreased endurance, decreased mobility, difficulty walking, decreased ROM, decreased strength, increased muscle spasms, impaired flexibility, improper body mechanics, and pain  ACTIVITY LIMITATIONS: lifting, bending, standing, squatting, stairs, transfers, locomotion level, and caring for others  PARTICIPATION LIMITATIONS: meal prep, cleaning, laundry, shopping, community activity, occupation, and yard work  PERSONAL FACTORS: Fitness and 3+ comorbidities: Hx R THA, R TKA, hx LBP, HTN are also affecting patient's functional outcome.   REHAB POTENTIAL: Good  CLINICAL DECISION MAKING: Evolving/moderate complexity  EVALUATION COMPLEXITY: Moderate   GOALS: Goals reviewed with patient? Yes  SHORT TERM GOALS:  Target date: 08/04/2024    Patient will be independent with HEP in order to improve functional outcomes. Baseline: Goal status: Met 08/03/23  2.  Patient will report at least 25% improvement in symptoms/functional status for improved quality of life. Baseline: Goal status: INITIAL    LONG TERM GOALS: Target date: 09/01/2024    Patient will report at least 75% improvement in symptoms for improved quality of life. Baseline:  Goal status: INITIAL  2.  Patient will improve LEFS  score by at least 15 points in order to indicate improved tolerance to activity. Baseline:  Goal status: INITIAL  3.  Patient will be able to navigate stairs with reciprocal pattern without compensation in order to demonstrate improved LE strength. Baseline:  Goal status: INITIAL  4. Patient will demonstrate grade of 5/5 MMT grade in all tested musculature as evidence of improved strength to assist with stair ambulation and gait. Baseline:  Goal status: INITIAL  5.  Patient will be able to complete 5x STS in under 15 seconds in order to reduce the risk of falls. Baseline:  Goal status: INITIAL     PLAN:  PT FREQUENCY: 2x/week  PT DURATION: 8 weeks  PLANNED INTERVENTIONS: 97164- PT Re-evaluation, 97110-Therapeutic exercises, 97530- Therapeutic activity, 97112- Neuromuscular re-education, 97535- Self Care, [id number]- Manual therapy, [phone]- Gait training, [phone]- Orthotic Fit/training, [phone]- Canalith repositioning, [id number]- Aquatic Therapy, [phone]- Splinting, [phone]- Wound care (first 20 sq cm), 97598- Wound care (each additional 20 sq cm)Patient/Family education, Balance training, Stair training, Taping, Dry Needling, Joint mobilization, Joint manipulation, Spinal manipulation, Spinal mobilization, Scar mobilization, and DME instructions.  PLAN FOR NEXT SESSION: aquatics and land, hip strength, functional strength   Asberry Rodes, PTA  08/19/24 9:43 AM [facility] [address] [place], [place], [address] Phone: [phone]   Fax:  [phone]

## 2024-08-23 ENCOUNTER — Ambulatory Visit (HOSPITAL_BASED_OUTPATIENT_CLINIC_OR_DEPARTMENT_OTHER): Admitting: Physical Therapy

## 2024-08-23 DIAGNOSIS — M25551 Pain in right hip: Secondary | ICD-10-CM | POA: Diagnosis not present

## 2024-08-23 DIAGNOSIS — M2550 Pain in unspecified joint: Secondary | ICD-10-CM | POA: Diagnosis not present

## 2024-08-23 DIAGNOSIS — G8929 Other chronic pain: Secondary | ICD-10-CM | POA: Diagnosis not present

## 2024-08-23 DIAGNOSIS — Z79891 Long term (current) use of opiate analgesic: Secondary | ICD-10-CM | POA: Diagnosis not present

## 2024-08-23 DIAGNOSIS — M25561 Pain in right knee: Secondary | ICD-10-CM | POA: Diagnosis not present

## 2024-08-23 DIAGNOSIS — Z96651 Presence of right artificial knee joint: Secondary | ICD-10-CM | POA: Diagnosis not present

## 2024-08-23 DIAGNOSIS — Z96641 Presence of right artificial hip joint: Secondary | ICD-10-CM | POA: Diagnosis not present

## 2024-08-24 ENCOUNTER — Ambulatory Visit (HOSPITAL_BASED_OUTPATIENT_CLINIC_OR_DEPARTMENT_OTHER): Admitting: Physical Therapy

## 2024-08-24 ENCOUNTER — Encounter (HOSPITAL_BASED_OUTPATIENT_CLINIC_OR_DEPARTMENT_OTHER): Payer: Self-pay | Admitting: Physical Therapy

## 2024-08-24 DIAGNOSIS — M25551 Pain in right hip: Secondary | ICD-10-CM

## 2024-08-24 DIAGNOSIS — R2689 Other abnormalities of gait and mobility: Secondary | ICD-10-CM

## 2024-08-24 DIAGNOSIS — M6281 Muscle weakness (generalized): Secondary | ICD-10-CM

## 2024-08-24 DIAGNOSIS — R29898 Other symptoms and signs involving the musculoskeletal system: Secondary | ICD-10-CM | POA: Diagnosis not present

## 2024-08-24 NOTE — Therapy (Signed)
 OUTPATIENT PHYSICAL THERAPY LOWER EXTREMITY TREATMENT   Patient Name: Reginald [name] MRN: [id number] DOB:Dec 03, 1965, 58 y.o., male Today's Date: 08/24/2024  END OF SESSION:  PT End of Session - 08/24/24 1440     Visit Number 12    Number of Visits 16    Date for Recertification  09/01/24    Authorization Type [employer]    Progress Note Due on Visit 10    PT Start Time 1445    PT Stop Time 1525    PT Time Calculation (min) 40 min    Activity Tolerance Patient tolerated treatment well    Behavior During Therapy [facility] for tasks assessed/performed            Past Medical History:  Diagnosis Date   Arthritis    Gallstones    Hypertension    Past Surgical History:  Procedure Laterality Date   CHOLECYSTECTOMY  12/03/2011   Procedure: LAPAROSCOPIC CHOLECYSTECTOMY WITH INTRAOPERATIVE CHOLANGIOGRAM;  Surgeon: Camellia CHRISTELLA Blush, MD;  Location: [facility] OR;  Service: General;  Laterality: N/A;  laparoscopic cholecystectomy with intraoperative cholangiogram   CHONDROPLASTY Right 09/19/2015   Procedure: CHONDROPLASTY;  Surgeon: Norleen Gavel, MD;  Location: [facility];  Service: Orthopedics;  Laterality: Right;   ENDOVENOUS ABLATION SAPHENOUS VEIN W/ LASER Right 01/13/2019   endovenous laser ablation right greater saphenous vein and stab phlebectomy 10-20 incisions right leg by Lonni Blade MD    IR ABLATE LIVER CRYOABLATION  04/03/2020   IR RADIOLOGIST EVAL & MGMT  03/28/2020   KNEE ARTHROSCOPY WITH LATERAL MENISECTOMY Right 09/19/2015   Procedure: KNEE ARTHROSCOPY WITH PARTIAL LATERAL MENISECTOMY;  Surgeon: Norleen Gavel, MD;  Location: [facility];  Service: Orthopedics;  Laterality: Right;   KNEE ARTHROSCOPY WITH MEDIAL MENISECTOMY Right 09/19/2015   Procedure: KNEE ARTHROSCOPY WITH PARTIAL MEDIAL MENISECTOMY;  Surgeon: Norleen Gavel, MD;  Location: [facility];  Service: Orthopedics;  Laterality: Right;   ROTATOR CUFF REPAIR Left     with revision   spider bite     black widow or brown recluse   TOE FUSION Left    TOTAL HIP ARTHROPLASTY Right 06/15/2023   Procedure: TOTAL HIP ARTHROPLASTY ANTERIOR APPROACH;  Surgeon: Gavel Norleen, MD;  Location: [facility];  Service: Orthopedics;  Laterality: Right;   TOTAL KNEE ARTHROPLASTY Right 03/31/2018   Procedure: RIGHT TOTAL KNEE ARTHROPLASTY;  Surgeon: Gavel Norleen, MD;  Location: [facility];  Service: Orthopedics;  Laterality: Right;   TOTAL KNEE REVISION Right 11/04/2019   Procedure: RIGHT TOTAL KNEE REVISION;  Surgeon: Gavel Norleen, MD;  Location: [facility];  Service: Orthopedics;  Laterality: Right;   TRIGGER FINGER RELEASE Right    Patient Active Problem List   Diagnosis Date Noted   Primary osteoarthritis of right hip 06/14/2023   Meralgia paraesthetica, right 06/10/2023   Spondylosis without myelopathy or radiculopathy, lumbar region 06/10/2023   Painful total knee replacement, right 11/04/2019   Arthrofibrosis of knee joint, right 11/04/2019   S/P revision of total knee, right 11/04/2019   Primary osteoarthritis of right knee 03/31/2018   Hypertension 12/02/2011   Obesity (BMI 30-39.9) 12/02/2011    PCP: [facility]  REFERRING PROVIDER: Gavel Norleen, MD   REFERRING DIAG: S/P right hip trochanteric bursectomy and IT band release  THERAPY DIAG:  Other symptoms and signs involving the musculoskeletal system  Pain in right hip  Other abnormalities of gait and mobility  Muscle weakness (generalized)  Rationale for Evaluation and Treatment: Rehabilitation  ONSET DATE:  3 weeks ago  SUBJECTIVE:   SUBJECTIVE STATEMENT: Pt reports 6/10 pain in hip and bilat knee. He states pain fluctuates daily.    PERTINENT HISTORY: Hx R THA, R TKA, hx LBP, HTN  PAIN:  Are you having pain? Yes: NPRS scale: 4.5/10 Pain location: R hip, Rt groin and Rt distal lateral knee Pain description: aching, sharp Aggravating factors: ROM,  Relieving factors:  rest  PRECAUTIONS: None  WEIGHT BEARING RESTRICTIONS: No  FALLS:  Has patient fallen in last 6 months? No  PLOF: Independent  PATIENT GOALS:  a little bit of relief  OBJECTIVE: (objective measures from initial evaluation unless otherwise dated)  MRI 8/25 right hip  IMPRESSION: 1. Right total hip arthroplasty with susceptibility artifact partially obscuring the adjacent soft tissue and osseous structures. No periarticular fluid collection or osteolysis. 2. Large amount of fluid in the right greater trochanteric bursa consistent with bursitis. 3. Mild osteoarthritis of the left hip.  PATIENT SURVEYS:  LEFS  Extreme difficulty/unable (0), Quite a bit of difficulty (1), Moderate difficulty (2), Little difficulty (3), No difficulty (4) Survey date:  07/07/24  Any of your usual work, housework or school activities 1  2. Usual hobbies, recreational or sporting activities 1  3. Getting into/out of the bath 1  4. Walking between rooms 2  5. Putting on socks/shoes 1  6. Squatting  2  7. Lifting an object, like a bag of groceries from the floor 2  8. Performing light activities around your home 2  9. Performing heavy activities around your home 0  10. Getting into/out of a car 2  11. Walking 2 blocks 0  12. Walking 1 mile 0  13. Going up/down 10 stairs (1 flight) 1  14. Standing for 1 hour 1  15.  sitting for 1 hour 2  16. Running on even ground 0  17. Running on uneven ground 0  18. Making sharp turns while running fast 0  19. Hopping  0  20. Rolling over in bed 1  Score total:  19/80     COGNITION: Overall cognitive status: Within functional limits for tasks assessed     SENSATION: WFL  POSTURE: No Significant postural limitations  PALPATION: TTP R greater Troch   LOWER EXTREMITY ROM:  Active ROM Right eval Left eval  Hip flexion    Hip extension    Hip abduction    Hip adduction    Hip internal rotation    Hip external rotation    Knee flexion    Knee  extension    Ankle dorsiflexion    Ankle plantarflexion    Ankle inversion    Ankle eversion     (Blank rows = not tested) *= pain/symptoms  LOWER EXTREMITY MMT:  MMT Right eval Left eval  Hip flexion 4+ 5  Hip extension 4 4+  Hip abduction (sidelying) 4-   Hip adduction    Hip internal rotation    Hip external rotation    Knee flexion 5 5  Knee extension 5 5  Ankle dorsiflexion    Ankle plantarflexion    Ankle inversion    Ankle eversion     (Blank rows = not tested) *= pain/symptoms  FUNCTIONAL TESTS:  5 times sit to stand: 26.97 seconds  Stairs: 7 inch, step too pattern, uses LLE  GAIT: Distance walked: 100 feet  Assistive device utilized: None Level of assistance: Complete Independence Comments: antalgic on RLE with truncal lean to R   TODAY'S TREATMENT:  08/24/24 Pt seen for aquatic therapy today.  Treatment took place in water  3.5-4.75 ft in depth at the [facility] pool. Temp of water  was 91.  Pt entered/exited the pool via stairs independently in step-to pattern with bil rail.   - unsupported walking forward/ backward/side stepping  -solid black noodle stomp rle  hip in neutral, then externally rotated 2 sets of 10 reps slow/10 fast opposite ue support on wall -standing balance on black noodle :wide stance rolling heel/to->balancing directly on noodle->squats x 7 -FT in position above standing balance challenge on noodle holding x 10s - step ups leading R/L x 10 unsupported -2nd step toe tap x 10 alternating - runners step ups x 10.  Good SLS balance -seated on 3rd step flutter kicking/hip flex/ext; hip add/abd 3 sets 10 slow/20 fast. - STS from 3rd step with cues for immediate standing balance then decent with hip hinge. - side lunge with yellow HB ue add/abd (adductor stretch)      08/19/24  Nu step x31min L5  Standing  hip abduction/extension 2x10ea/bil PROM R hip 1x10 clamshell with blue band  3x10 bridges   3x10 leg press 140lbs 3x30 seconds prone quad stretch with strap 2x10 partial lunges  2x10 squats    08/16/24 Pt seen for aquatic therapy today.  Treatment took place in water  3.5-4.75 ft in depth at the [facility] pool. Temp of water  was 91.  Pt entered/exited the pool via stairs independently in step-to pattern with bil rail.   - unsupported walking forward/ backward/side stepping  - step ups leading R/L x 10 unsupported-> runners step ups x 10.  VC and demonstration for execution - Forward split squats 2 widths yellow HB  - UE on yellow hand floats side lunge x 4 widths. Wide squat increased stretch -figure 4 stretch at quad stretch at steps -solid noodle stomp rle  hip in neutral 2 x 10 then externally rotated x10 -seated on 3rd step flutter kicking/hip flex/ext; hip add/abd 3 sets 20 slow/20 fast.   08/04/24 Manual Therapy: STM to right IT band, iliopsoas, and quad   Therapeutic Exercise:  1x10 clamshell with blue band  2x10 bridges   3x10 leg press 70# for 10 reps, 100# for 10 reps  3x30 seconds prone quad stretch with strap  Therapeutic Activity:  2x10 partial lunges  2x10 squats   08/02/24 Pt seen for aquatic therapy today.  Treatment took place in water  3.5-4.75 ft in depth at the [facility] pool. Temp of water  was 91.  Pt entered/exited the pool via stairs independently in step-to pattern with bil rail.  Manual therapy prior to entering pool: MFR to Rt iliopsoas  - unsupported walking forward/ backward/side stepping with long strides for hip flexor stretch - UE on wall: split squats x 10 each LE; single leg clams x 10 each;   - UE on yellow hand floats side lunge x 2 widths - plank on bench: hip extension alternating x 10; mountain climbers 2 x 5 - prone suspension using noodle : hip flex/ext; hip add/abd  Date:  07/26/2024 -PROM R hip -STM to  proximal ihip flexors, HS -STM/roller to lateral HS  - Bridge 2x10 - S/l clam 2x10 - Side stepping with BLKTB around ankles x5 laps at rail - Squats 2x15 -partial lunges x10ea - Modified thomas stretch with focus on hip flexors  - Prone HS curls x20 Standing hip extension 2x20ea  Date:  07/23/2024 -STM to lateral HS  - Attempted SL Bridges with pain  in groin - S/l clam 2x10 - Side stepping with BLKTB around ankles - Squats with BLKTB around knees - Discussion about compensations due to multiple surgeries.  - Modified thomas stretch with focus on hip flexors  - Prone HS curls with RTB resistance   PATIENT EDUCATION:  Education details: self massage techniques with ball  Person educated: Patient Education method: Explanation, Demonstration,  Education comprehension: verbalized understanding, returned demonstration, verbal cues required, and tactile cues required  HOME EXERCISE PROGRAM: Access Code: [email or web address] URL: [email or web address] Date: 07/07/2024 Prepared by: Prentice Zaunegger  Exercises - Supine Bridge  - 1 x daily - 7 x weekly - 3 sets - 10 reps - Clamshell (Mirrored)  - 1 x daily - 7 x weekly - 3 sets - 10 reps  ASSESSMENT:  CLINICAL IMPRESSION: Pt performs progression of session with good tolerance as well as good effort. Improvements made in SLS, dynamic balance and strengthening. Pt not forthcoming on pain level so as on land close, monitoring throughout. His pain does vary with pt still reaching 6/10 pain. MD f/u Friday.     Eval: Patient a 58 y.o. y.o. male who was seen today for physical therapy evaluation and treatment for S/P right hip trochanteric bursectomy and IT band release. Patient presents with pain limited deficits in R hip strength, ROM, endurance, activity tolerance, and functional mobility with ADL. Patient is having to modify and restrict ADL as indicated by outcome measure score as well as subjective information and objective  measures which is affecting overall participation. Patient will benefit from skilled physical therapy in order to improve function and reduce impairment.  OBJECTIVE IMPAIRMENTS: Abnormal gait, decreased activity tolerance, decreased balance, decreased endurance, decreased mobility, difficulty walking, decreased ROM, decreased strength, increased muscle spasms, impaired flexibility, improper body mechanics, and pain  ACTIVITY LIMITATIONS: lifting, bending, standing, squatting, stairs, transfers, locomotion level, and caring for others  PARTICIPATION LIMITATIONS: meal prep, cleaning, laundry, shopping, community activity, occupation, and yard work  PERSONAL FACTORS: Fitness and 3+ comorbidities: Hx R THA, R TKA, hx LBP, HTN are also affecting patient's functional outcome.   REHAB POTENTIAL: Good  CLINICAL DECISION MAKING: Evolving/moderate complexity  EVALUATION COMPLEXITY: Moderate   GOALS: Goals reviewed with patient? Yes  SHORT TERM GOALS: Target date: 08/04/2024    Patient will be independent with HEP in order to improve functional outcomes. Baseline: Goal status: Met 08/03/23  2.  Patient will report at least 25% improvement in symptoms/functional status for improved quality of life. Baseline: Goal status: In progress 08/25/23    LONG TERM GOALS: Target date: 09/01/2024    Patient will report at least 75% improvement in symptoms for improved quality of life. Baseline:  Goal status: INITIAL  2.  Patient will improve LEFS  score by at least 15 points in order to indicate improved tolerance to activity. Baseline:  Goal status: INITIAL  3.  Patient will be able to navigate stairs with reciprocal pattern without compensation in order to demonstrate improved LE strength. Baseline:  Goal status: In progress 08/24/24  4. Patient will demonstrate grade of 5/5 MMT grade in all tested musculature as evidence of improved strength to assist with stair ambulation and gait. Baseline:   Goal status: INITIAL  5.  Patient will be able to complete 5x STS in under 15 seconds in order to reduce the risk of falls. Baseline:  Goal status: INITIAL     PLAN:  PT FREQUENCY: 2x/week  PT DURATION: 8 weeks  PLANNED INTERVENTIONS: 97164- PT Re-evaluation, 97110-Therapeutic exercises,  02469- Therapeutic activity, [id number]- Neuromuscular re-education, [id number]- Self Care, [id number]- Manual therapy, [id number]- Gait training, [phone]- Orthotic Fit/training, [id number]- Canalith repositioning, [id number]- Aquatic Therapy, [id number]- Splinting, [phone]- Wound care (first 20 sq cm), 97598- Wound care (each additional 20 sq cm)Patient/Family education, Balance training, Stair training, Taping, Dry Needling, Joint mobilization, Joint manipulation, Spinal manipulation, Spinal mobilization, Scar mobilization, and DME instructions.  PLAN FOR NEXT SESSION: aquatics and land, hip strength, functional strength   Ronal Foots) Tyiana Hill MPT 08/24/24 2:42 PM [facility] [address] [place], [place], [address] Phone: [phone]   Fax:  [phone]

## 2024-08-26 ENCOUNTER — Encounter (HOSPITAL_BASED_OUTPATIENT_CLINIC_OR_DEPARTMENT_OTHER): Payer: Self-pay | Admitting: Physical Therapy

## 2024-08-26 ENCOUNTER — Ambulatory Visit (HOSPITAL_BASED_OUTPATIENT_CLINIC_OR_DEPARTMENT_OTHER): Admitting: Physical Therapy

## 2024-08-26 ENCOUNTER — Other Ambulatory Visit (HOSPITAL_BASED_OUTPATIENT_CLINIC_OR_DEPARTMENT_OTHER): Payer: Self-pay

## 2024-08-26 ENCOUNTER — Ambulatory Visit (HOSPITAL_BASED_OUTPATIENT_CLINIC_OR_DEPARTMENT_OTHER): Admitting: Orthopaedic Surgery

## 2024-08-26 ENCOUNTER — Ambulatory Visit (HOSPITAL_BASED_OUTPATIENT_CLINIC_OR_DEPARTMENT_OTHER): Payer: Self-pay | Admitting: Orthopaedic Surgery

## 2024-08-26 DIAGNOSIS — R29898 Other symptoms and signs involving the musculoskeletal system: Secondary | ICD-10-CM | POA: Diagnosis not present

## 2024-08-26 DIAGNOSIS — S76011A Strain of muscle, fascia and tendon of right hip, initial encounter: Secondary | ICD-10-CM | POA: Diagnosis not present

## 2024-08-26 DIAGNOSIS — M1611 Unilateral primary osteoarthritis, right hip: Secondary | ICD-10-CM

## 2024-08-26 DIAGNOSIS — R2689 Other abnormalities of gait and mobility: Secondary | ICD-10-CM

## 2024-08-26 DIAGNOSIS — M6281 Muscle weakness (generalized): Secondary | ICD-10-CM

## 2024-08-26 DIAGNOSIS — M25551 Pain in right hip: Secondary | ICD-10-CM

## 2024-08-26 MED ORDER — CELECOXIB 200 MG PO CAPS
200.0000 mg | ORAL_CAPSULE | Freq: Two times a day (BID) | ORAL | 2 refills | Status: AC
  Filled 2024-08-26: qty 60, 30d supply, fill #0
  Filled 2024-09-20: qty 60, 30d supply, fill #1
  Filled 2024-10-25: qty 60, 30d supply, fill #2

## 2024-08-26 NOTE — Progress Notes (Signed)
 Chief Complaint: Right hip pain     History of Present Illness:   08/26/2024: Presents today for MRI discussion of the right hip.  He is still been having pain about the lateral aspect of the hip.  Reginald [name] is a 58 y.o. male presents with ongoing right hip pain.  He is status post right total hip arthroplasty in 2024 with Dr. Yvone as well as a right IT band release 6 weeks prior.  He has been experiencing pain in the groin as well as the lateral trochanter since the hip replacement.  He is on oxycodone  at baseline.  He experiences the pain predominantly deep in the groin with any activity.    PMH/PSH/Family History/Social History/Meds/Allergies:    Past Medical History:  Diagnosis Date   Arthritis    Gallstones    Hypertension    Past Surgical History:  Procedure Laterality Date   CHOLECYSTECTOMY  12/03/2011   Procedure: LAPAROSCOPIC CHOLECYSTECTOMY WITH INTRAOPERATIVE CHOLANGIOGRAM;  Surgeon: Camellia CHRISTELLA Blush, MD;  Location: [facility] OR;  Service: General;  Laterality: N/A;  laparoscopic cholecystectomy with intraoperative cholangiogram   CHONDROPLASTY Right 09/19/2015   Procedure: CHONDROPLASTY;  Surgeon: [name], MD;  Location: [facility];  Service: Orthopedics;  Laterality: Right;   ENDOVENOUS ABLATION SAPHENOUS VEIN W/ LASER Right 01/13/2019   endovenous laser ablation right greater saphenous vein and stab phlebectomy 10-20 incisions right leg by Lonni Blade MD    IR ABLATE LIVER CRYOABLATION  04/03/2020   IR RADIOLOGIST EVAL & MGMT  03/28/2020   KNEE ARTHROSCOPY WITH LATERAL MENISECTOMY Right 09/19/2015   Procedure: KNEE ARTHROSCOPY WITH PARTIAL LATERAL MENISECTOMY;  Surgeon: [name], MD;  Location: [facility];  Service: Orthopedics;  Laterality: Right;   KNEE ARTHROSCOPY WITH MEDIAL MENISECTOMY Right 09/19/2015   Procedure: KNEE ARTHROSCOPY WITH PARTIAL MEDIAL MENISECTOMY;  Surgeon: [name], MD;  Location:   [facility];  Service: Orthopedics;  Laterality: Right;   ROTATOR CUFF REPAIR Left    with revision   spider bite     black widow or brown recluse   TOE FUSION Left    TOTAL HIP ARTHROPLASTY Right 06/15/2023   Procedure: TOTAL HIP ARTHROPLASTY ANTERIOR APPROACH;  Surgeon: [name], MD;  Location: [facility];  Service: Orthopedics;  Laterality: Right;   TOTAL KNEE ARTHROPLASTY Right 03/31/2018   Procedure: RIGHT TOTAL KNEE ARTHROPLASTY;  Surgeon: [name], MD;  Location: [facility];  Service: Orthopedics;  Laterality: Right;   TOTAL KNEE REVISION Right 11/04/2019   Procedure: RIGHT TOTAL KNEE REVISION;  Surgeon: [name], MD;  Location: [facility];  Service: Orthopedics;  Laterality: Right;   TRIGGER FINGER RELEASE Right    Social History   Socioeconomic History   Marital status: Married    Spouse name: Not on file   Number of children: Not on file   Years of education: Not on file   Highest education level: Not on file  Occupational History   Not on file  Tobacco Use   Smoking status: Never   Smokeless tobacco: Never  Vaping Use   Vaping status: Never Used  Substance and Sexual Activity   Alcohol use: Yes    Alcohol/week: 1.0 standard drink of alcohol    Types: 1 Cans of beer per week    Comment: occa.   Drug use: No   Sexual activity: Not Currently  Other Topics Concern   Not on file  Social History Narrative   Not on file  Social Drivers of Corporate investment banker Strain: Not on file  Food Insecurity: Not on file  Transportation Needs: Not on file  Physical Activity: Not on file  Stress: Not on file  Social Connections: Unknown (03/31/2022)   [facility]   Social Network    Social Network: Not on file   Family History  Problem Relation Age of Onset   Cancer Mother        breast   Heart disease Mother    Allergies  Allergen Reactions   Gabapentin      Head cloudy    Ibuprofen Nausea And Vomiting    High doses make him vomit.    Current Outpatient Medications  Medication Sig Dispense Refill   celecoxib  (CELEBREX ) 200 MG capsule Take 1 capsule (200 mg total) by mouth 2 (two) times daily. 60 capsule 2   aspirin  EC 325 MG tablet Take 1 tablet (325 mg total) by mouth 2 (two) times daily after a meal. Take x 1 month post op to decrease risk of blood clots. 60 tablet 0   docusate sodium  (COLACE) 100 MG capsule Take 1 capsule (100 mg total) by mouth 2 (two) times daily. 30 capsule 0   folic acid (FOLVITE) 1 MG tablet Take 1 mg by mouth daily.     HYDROcodone -acetaminophen  (NORCO) 10-325 MG tablet Take 1 tablet by mouth 3 (three) times daily.     LORazepam  (ATIVAN ) 1 MG tablet Take 1 tablet (1 mg total) by mouth every 8 (eight) hours. 2 tablet 0   losartan  (COZAAR ) 100 MG tablet Take 100 mg by mouth daily.     Menthol, Topical Analgesic, (BIOFREEZE EX) Apply 1 Application topically daily as needed (pain).     oxyCODONE -acetaminophen  (PERCOCET/ROXICET) 5-325 MG tablet Take 1-2 tablets by mouth every 6 (six) hours as needed for severe pain. 30 tablet 0   tiZANidine  (ZANAFLEX ) 2 MG tablet Take 1 tablet (2 mg total) by mouth every 8 (eight) hours as needed for muscle spasms. 40 tablet 0   VITAMIN D PO Take 1 capsule by mouth daily.     No current facility-administered medications for this visit.   No results found.  Review of Systems:   A ROS was performed including pertinent positives and negatives as documented in the HPI.  Physical Exam :   Constitutional: NAD and appears stated age Neurological: Alert and oriented Psych: Appropriate affect and cooperative There were no vitals taken for this visit.   Comprehensive Musculoskeletal Exam:    Tenderness about the femoral acetabular joint particular with a resisted extension position.  There is also tenderness about the lateral incision of his IT band as well as the trochanter.  Remainder of distal neurosensory exam is intact walks with a mildly antalgic gait.  Range of  motion is 30 degrees internal/external rotation of the right hip with good abduction strength   Imaging:   Xray (3 views right hip): Status post right total hip arthroplasty without evidence of complication  MRI right hip: There is evidence of an undersurface gluteus medius and minimus tear   I personally reviewed and interpreted the radiographs.   Assessment and Plan:   58 y.o. male with evidence of right hip iliopsoas tendinitis which is improved following iliopsoas injection.  At this time I did discuss that he does have evidence of an undersurface tear of the gluteus medius and minimus.  He has tried physical therapy following surgery.  Given the fact that he has not improved  we did discuss additional treatment options.  I do ultimately believe he would be a candidate for arthroscopic gluteus medius repair.  I did discuss risks and limitations as well as associated recovery timeframe.  After discussion he would like to proceed  - Plan for right hip endoscopic gluteus medius repair   After a lengthy discussion of treatment options, including risks, benefits, alternatives, complications of surgical and nonsurgical conservative options, the patient elected surgical repair.   The patient  is aware of the material risks  and complications including, but not limited to injury to adjacent structures, neurovascular injury, infection, numbness, bleeding, implant failure, thermal burns, stiffness, persistent pain, failure to heal, disease transmission from allograft, need for further surgery, dislocation, anesthetic risks, blood clots, risks of death,and others. The probabilities of surgical success and failure discussed with patient given their particular co-morbidities.The time and nature of expected rehabilitation and recovery was discussed.The patient's questions were all answered preoperatively.  No barriers to understanding were noted. I explained the natural history of the disease process and  Rx rationale.  I explained to the patient what I considered to be reasonable expectations given their personal situation.  The final treatment plan was arrived at through a shared patient decision making process model.    I personally saw and evaluated the patient, and participated in the management and treatment plan.  Elspeth Parker, MD Attending Physician, Orthopedic Surgery  This document was dictated using Dragon voice recognition software. A reasonable attempt at proof reading has been made to minimize errors.

## 2024-08-26 NOTE — Therapy (Addendum)
 OUTPATIENT PHYSICAL THERAPY LOWER EXTREMITY TREATMENT   Patient Name: Reginald [name] MRN: [id number] DOB:10-Nov-1966, 58 y.o., male Today's Date: 08/26/2024  END OF SESSION:  PT End of Session - 08/26/24 0719     Visit Number 13    Number of Visits 16    Date for Recertification  09/01/24    Authorization Type [employer]    Progress Note Due on Visit 20    PT Start Time 0717    PT Stop Time 0757    PT Time Calculation (min) 40 min    Activity Tolerance Patient tolerated treatment well    Behavior During Therapy [facility] for tasks assessed/performed          Past Medical History:  Diagnosis Date   Arthritis    Gallstones    Hypertension    Past Surgical History:  Procedure Laterality Date   CHOLECYSTECTOMY  12/03/2011   Procedure: LAPAROSCOPIC CHOLECYSTECTOMY WITH INTRAOPERATIVE CHOLANGIOGRAM;  Surgeon: Camellia CHRISTELLA Blush, MD;  Location: [facility] OR;  Service: General;  Laterality: N/A;  laparoscopic cholecystectomy with intraoperative cholangiogram   CHONDROPLASTY Right 09/19/2015   Procedure: CHONDROPLASTY;  Surgeon: Norleen Gavel, MD;  Location: [facility];  Service: Orthopedics;  Laterality: Right;   ENDOVENOUS ABLATION SAPHENOUS VEIN W/ LASER Right 01/13/2019   endovenous laser ablation right greater saphenous vein and stab phlebectomy 10-20 incisions right leg by Lonni Blade MD    IR ABLATE LIVER CRYOABLATION  04/03/2020   IR RADIOLOGIST EVAL & MGMT  03/28/2020   KNEE ARTHROSCOPY WITH LATERAL MENISECTOMY Right 09/19/2015   Procedure: KNEE ARTHROSCOPY WITH PARTIAL LATERAL MENISECTOMY;  Surgeon: Norleen Gavel, MD;  Location: [facility];  Service: Orthopedics;  Laterality: Right;   KNEE ARTHROSCOPY WITH MEDIAL MENISECTOMY Right 09/19/2015   Procedure: KNEE ARTHROSCOPY WITH PARTIAL MEDIAL MENISECTOMY;  Surgeon: Norleen Gavel, MD;  Location: [facility];  Service: Orthopedics;  Laterality: Right;   ROTATOR CUFF REPAIR Left    with  revision   spider bite     black widow or brown recluse   TOE FUSION Left    TOTAL HIP ARTHROPLASTY Right 06/15/2023   Procedure: TOTAL HIP ARTHROPLASTY ANTERIOR APPROACH;  Surgeon: Gavel Norleen, MD;  Location: [facility];  Service: Orthopedics;  Laterality: Right;   TOTAL KNEE ARTHROPLASTY Right 03/31/2018   Procedure: RIGHT TOTAL KNEE ARTHROPLASTY;  Surgeon: Gavel Norleen, MD;  Location: [facility];  Service: Orthopedics;  Laterality: Right;   TOTAL KNEE REVISION Right 11/04/2019   Procedure: RIGHT TOTAL KNEE REVISION;  Surgeon: Gavel Norleen, MD;  Location: [facility];  Service: Orthopedics;  Laterality: Right;   TRIGGER FINGER RELEASE Right    Patient Active Problem List   Diagnosis Date Noted   Primary osteoarthritis of right hip 06/14/2023   Meralgia paraesthetica, right 06/10/2023   Spondylosis without myelopathy or radiculopathy, lumbar region 06/10/2023   Painful total knee replacement, right 11/04/2019   Arthrofibrosis of knee joint, right 11/04/2019   S/P revision of total knee, right 11/04/2019   Primary osteoarthritis of right knee 03/31/2018   Hypertension 12/02/2011   Obesity (BMI 30-39.9) 12/02/2011    PCP: [facility]  REFERRING PROVIDER: Gavel Norleen, MD  REFERRING DIAG: S/P right hip trochanteric bursectomy and IT band release  THERAPY DIAG:  Other symptoms and signs involving the musculoskeletal system  Pain in right hip  Other abnormalities of gait and mobility  Muscle weakness (generalized)  Rationale for Evaluation and Treatment: Rehabilitation  ONSET DATE: 3 weeks ago  SUBJECTIVE:   SUBJECTIVE STATEMENT: Pt reports that he is doing well today. Reports 4.5/10 pain this morning.   PERTINENT HISTORY: Hx R THA, R TKA, hx LBP, HTN  PAIN:  Are you having pain? Yes: NPRS scale: 4.5/10 Pain location: R hip, Rt groin and Rt distal lateral knee Pain description: aching, sharp Aggravating factors: ROM,  Relieving factors: rest  PRECAUTIONS:  None  WEIGHT BEARING RESTRICTIONS: No  FALLS:  Has patient fallen in last 6 months? No  PLOF: Independent  PATIENT GOALS:  a little bit of relief  OBJECTIVE: (objective measures from initial evaluation unless otherwise dated)  MRI 8/25 right hip  IMPRESSION: 1. Right total hip arthroplasty with susceptibility artifact partially obscuring the adjacent soft tissue and osseous structures. No periarticular fluid collection or osteolysis. 2. Large amount of fluid in the right greater trochanteric bursa consistent with bursitis. 3. Mild osteoarthritis of the left hip.  PATIENT SURVEYS:  LEFS  Extreme difficulty/unable (0), Quite a bit of difficulty (1), Moderate difficulty (2), Little difficulty (3), No difficulty (4) Survey date:  07/07/24  Any of your usual work, housework or school activities 1  2. Usual hobbies, recreational or sporting activities 1  3. Getting into/out of the bath 1  4. Walking between rooms 2  5. Putting on socks/shoes 1  6. Squatting  2  7. Lifting an object, like a bag of groceries from the floor 2  8. Performing light activities around your home 2  9. Performing heavy activities around your home 0  10. Getting into/out of a car 2  11. Walking 2 blocks 0  12. Walking 1 mile 0  13. Going up/down 10 stairs (1 flight) 1  14. Standing for 1 hour 1  15.  sitting for 1 hour 2  16. Running on even ground 0  17. Running on uneven ground 0  18. Making sharp turns while running fast 0  19. Hopping  0  20. Rolling over in bed 1  Score total:  19/80     COGNITION: Overall cognitive status: Within functional limits for tasks assessed     SENSATION: WFL  POSTURE: No Significant postural limitations  PALPATION: TTP R greater Troch   LOWER EXTREMITY ROM:  Active ROM Right eval Left eval  Hip flexion    Hip extension    Hip abduction    Hip adduction    Hip internal rotation    Hip external rotation    Knee flexion    Knee extension    Ankle  dorsiflexion    Ankle plantarflexion    Ankle inversion    Ankle eversion     (Blank rows = not tested) *= pain/symptoms  LOWER EXTREMITY MMT:  MMT Right eval Left eval  Hip flexion 4+ 5  Hip extension 4 4+  Hip abduction (sidelying) 4-   Hip adduction    Hip internal rotation    Hip external rotation    Knee flexion 5 5  Knee extension 5 5  Ankle dorsiflexion    Ankle plantarflexion    Ankle inversion    Ankle eversion     (Blank rows = not tested) *= pain/symptoms  FUNCTIONAL TESTS:  5 times sit to stand: 26.97 seconds  Stairs: 7 inch, step too pattern, uses LLE  GAIT: Distance walked: 100 feet  Assistive device utilized: None Level of assistance: Complete Independence Comments: antalgic on RLE with truncal lean to R   TODAY'S TREATMENT:  08/27/24 NuStep x 6 min  Manual: IASTM with Massage gun to R ITB and quads  LF leg extension 45# 3x10 LF leg press 110# x10, 140# 2x10 RDLs with 3# dowel 2x10 Active hamstring stretch 2x10 with 5 second hold   08/24/24 Pt seen for aquatic therapy today.  Treatment took place in water  3.5-4.75 ft in depth at the [facility] pool. Temp of water  was 91.  Pt entered/exited the pool via stairs independently in step-to pattern with bil rail.  - unsupported walking forward/ backward/side stepping  -solid black noodle stomp rle  hip in neutral, then externally rotated 2 sets of 10 reps slow/10 fast opposite ue support on wall -standing balance on black noodle :wide stance rolling heel/to->balancing directly on noodle->squats x 7 -FT in position above standing balance challenge on noodle holding x 10s - step ups leading R/L x 10 unsupported -2nd step toe tap x 10 alternating - runners step ups x 10.  Good SLS balance -seated on 3rd step flutter kicking/hip flex/ext; hip add/abd 3 sets 10 slow/20 fast. -  STS from 3rd step with cues for immediate standing balance then decent with hip hinge. - side lunge with yellow HB ue add/abd (adductor stretch)   08/19/24  Nu step x45min L5  Standing hip abduction/extension 2x10ea/bil PROM R hip 1x10 clamshell with blue band  3x10 bridges   3x10 leg press 140lbs 3x30 seconds prone quad stretch with strap 2x10 partial lunges  2x10 squats    08/16/24 Pt seen for aquatic therapy today.  Treatment took place in water  3.5-4.75 ft in depth at the [facility] pool. Temp of water  was 91.  Pt entered/exited the pool via stairs independently in step-to pattern with bil rail.   - unsupported walking forward/ backward/side stepping  - step ups leading R/L x 10 unsupported-> runners step ups x 10.  VC and demonstration for execution - Forward split squats 2 widths yellow HB  - UE on yellow hand floats side lunge x 4 widths. Wide squat increased stretch -figure 4 stretch at quad stretch at steps -solid noodle stomp rle  hip in neutral 2 x 10 then externally rotated x10 -seated on 3rd step flutter kicking/hip flex/ext; hip add/abd 3 sets 20 slow/20 fast.   08/04/24 Manual Therapy: STM to right IT band, iliopsoas, and quad   Therapeutic Exercise:  1x10 clamshell with blue band  2x10 bridges   3x10 leg press 70# for 10 reps, 100# for 10 reps  3x30 seconds prone quad stretch with strap  Therapeutic Activity:  2x10 partial lunges  2x10 squats   08/02/24 Pt seen for aquatic therapy today.  Treatment took place in water  3.5-4.75 ft in depth at the [facility] pool. Temp of water  was 91.  Pt entered/exited the pool via stairs independently in step-to pattern with bil rail.  Manual therapy prior to entering pool: MFR to Rt iliopsoas  - unsupported walking forward/ backward/side stepping with long strides for hip flexor stretch - UE on wall: split squats x 10 each LE; single leg clams x 10 each;   - UE on yellow hand floats side lunge x 2  widths - plank on bench: hip extension alternating x 10; mountain climbers 2 x 5 - prone suspension using noodle : hip flex/ext; hip add/abd  Date:  07/26/2024 -PROM R hip -STM to proximal ihip flexors, HS -STM/roller to lateral HS  - Bridge 2x10 - S/l clam 2x10 - Side stepping with BLKTB around ankles x5 laps at rail -  Squats 2x15 -partial lunges x10ea - Modified thomas stretch with focus on hip flexors  - Prone HS curls x20 Standing hip extension 2x20ea  Date:  07/23/2024 -STM to lateral HS  - Attempted SL Bridges with pain in groin - S/l clam 2x10 - Side stepping with BLKTB around ankles - Squats with BLKTB around knees - Discussion about compensations due to multiple surgeries.  - Modified thomas stretch with focus on hip flexors  - Prone HS curls with RTB resistance   PATIENT EDUCATION:  Education details: self massage techniques with ball  Person educated: Patient Education method: Explanation, Demonstration,  Education comprehension: verbalized understanding, returned demonstration, verbal cues required, and tactile cues required  HOME EXERCISE PROGRAM: Access Code: [email or web address] URL: [email or web address] Date: 07/07/2024 Prepared by: Prentice Zaunegger  Exercises - Supine Bridge  - 1 x daily - 7 x weekly - 3 sets - 10 reps - Clamshell (Mirrored)  - 1 x daily - 7 x weekly - 3 sets - 10 reps  ASSESSMENT:  CLINICAL IMPRESSION: Pt tolerated treatment well today with no significant increase in pain. Manual therapy utilized to decrease hyperactive musculature and decrease pain. Exercises focused on strengthening the lower extremities to improve activity tolerance. Verbal cueing for proper breathing during exercises. Educated on expected soreness and activity modification. Patient will continue to benefit from skilled therapy to address remaining limitations and return to prior level of function. Will reassess next land session for likely extension of POC.     Eval: Patient a 58 y.o. y.o. male who was seen today for physical therapy evaluation and treatment for S/P right hip trochanteric bursectomy and IT band release. Patient presents with pain limited deficits in R hip strength, ROM, endurance, activity tolerance, and functional mobility with ADL. Patient is having to modify and restrict ADL as indicated by outcome measure score as well as subjective information and objective measures which is affecting overall participation. Patient will benefit from skilled physical therapy in order to improve function and reduce impairment.  OBJECTIVE IMPAIRMENTS: Abnormal gait, decreased activity tolerance, decreased balance, decreased endurance, decreased mobility, difficulty walking, decreased ROM, decreased strength, increased muscle spasms, impaired flexibility, improper body mechanics, and pain  ACTIVITY LIMITATIONS: lifting, bending, standing, squatting, stairs, transfers, locomotion level, and caring for others  PARTICIPATION LIMITATIONS: meal prep, cleaning, laundry, shopping, community activity, occupation, and yard work  PERSONAL FACTORS: Fitness and 3+ comorbidities: Hx R THA, R TKA, hx LBP, HTN are also affecting patient's functional outcome.   REHAB POTENTIAL: Good  CLINICAL DECISION MAKING: Evolving/moderate complexity  EVALUATION COMPLEXITY: Moderate   GOALS: Goals reviewed with patient? Yes  SHORT TERM GOALS: Target date: 08/04/2024    Patient will be independent with HEP in order to improve functional outcomes. Baseline: Goal status: Met 08/03/23  2.  Patient will report at least 25% improvement in symptoms/functional status for improved quality of life. Baseline: Goal status: In progress 08/25/23    LONG TERM GOALS: Target date: 09/01/2024    Patient will report at least 75% improvement in symptoms for improved quality of life. Baseline:  Goal status: INITIAL  2.  Patient will improve LEFS  score by at least 15 points in  order to indicate improved tolerance to activity. Baseline:  Goal status: INITIAL  3.  Patient will be able to navigate stairs with reciprocal pattern without compensation in order to demonstrate improved LE strength. Baseline:  Goal status: In progress 08/24/24  4. Patient will demonstrate grade of 5/5 MMT grade in all tested musculature as  evidence of improved strength to assist with stair ambulation and gait. Baseline:  Goal status: INITIAL  5.  Patient will be able to complete 5x STS in under 15 seconds in order to reduce the risk of falls. Baseline:  Goal status: INITIAL     PLAN:  PT FREQUENCY: 2x/week  PT DURATION: 8 weeks  PLANNED INTERVENTIONS: 97164- PT Re-evaluation, 97110-Therapeutic exercises, 97530- Therapeutic activity, 97112- Neuromuscular re-education, 97535- Self Care, [id number]- Manual therapy, [phone]- Gait training, [phone]- Orthotic Fit/training, [phone]- Canalith repositioning, [id number]- Aquatic Therapy, [phone]- Splinting, [phone]- Wound care (first 20 sq cm), 97598- Wound care (each additional 20 sq cm)Patient/Family education, Balance training, Stair training, Taping, Dry Needling, Joint mobilization, Joint manipulation, Spinal manipulation, Spinal mobilization, Scar mobilization, and DME instructions.  PLAN FOR NEXT SESSION: aquatics and land, hip strength, functional strength   Lili Finder, Student-PT 08/26/2024, 7:57 AM   This entire session was performed under direct supervision and direction of a licensed therapist/therapist assistant . I have personally read, edited and approve of the note as written. 8:22 AM, 08/26/24 Prentice CANDIE Stains PT, DPT Physical Therapist at [facility]

## 2024-08-30 ENCOUNTER — Ambulatory Visit (HOSPITAL_BASED_OUTPATIENT_CLINIC_OR_DEPARTMENT_OTHER): Admitting: Physical Therapy

## 2024-08-30 ENCOUNTER — Encounter (HOSPITAL_BASED_OUTPATIENT_CLINIC_OR_DEPARTMENT_OTHER): Payer: Self-pay | Admitting: Physical Therapy

## 2024-08-30 DIAGNOSIS — R29898 Other symptoms and signs involving the musculoskeletal system: Secondary | ICD-10-CM

## 2024-08-30 DIAGNOSIS — M25551 Pain in right hip: Secondary | ICD-10-CM

## 2024-08-30 DIAGNOSIS — R2689 Other abnormalities of gait and mobility: Secondary | ICD-10-CM

## 2024-08-30 DIAGNOSIS — M6281 Muscle weakness (generalized): Secondary | ICD-10-CM

## 2024-08-30 NOTE — Therapy (Signed)
 OUTPATIENT PHYSICAL THERAPY LOWER EXTREMITY TREATMENT   Patient Name: Reginald [name] MRN: [id number] DOB:[birth year], 58 y.o., male Today's Date: 08/30/2024  END OF SESSION:  PT End of Session - 08/30/24 0852     Visit Number 14    Number of Visits 16    Date for Recertification  09/01/24    Authorization Type [employer]    Progress Note Due on Visit 20    PT Start Time 0845    PT Stop Time 0923    PT Time Calculation (min) 38 min    Activity Tolerance Patient tolerated treatment well    Behavior During Therapy [facility] for tasks assessed/performed          Past Medical History:  Diagnosis Date   Arthritis    Gallstones    Hypertension    Past Surgical History:  Procedure Laterality Date   CHOLECYSTECTOMY  12/03/2011   Procedure: LAPAROSCOPIC CHOLECYSTECTOMY WITH INTRAOPERATIVE CHOLANGIOGRAM;  Surgeon: Camellia CHRISTELLA Blush, MD;  Location: [facility] OR;  Service: General;  Laterality: N/A;  laparoscopic cholecystectomy with intraoperative cholangiogram   CHONDROPLASTY Right 09/19/2015   Procedure: CHONDROPLASTY;  Surgeon: Norleen Gavel, MD;  Location: [facility];  Service: Orthopedics;  Laterality: Right;   ENDOVENOUS ABLATION SAPHENOUS VEIN W/ LASER Right 01/13/2019   endovenous laser ablation right greater saphenous vein and stab phlebectomy 10-20 incisions right leg by Lonni Blade MD    IR ABLATE LIVER CRYOABLATION  04/03/2020   IR RADIOLOGIST EVAL & MGMT  03/28/2020   KNEE ARTHROSCOPY WITH LATERAL MENISECTOMY Right 09/19/2015   Procedure: KNEE ARTHROSCOPY WITH PARTIAL LATERAL MENISECTOMY;  Surgeon: Norleen Gavel, MD;  Location: [facility];  Service: Orthopedics;  Laterality: Right;   KNEE ARTHROSCOPY WITH MEDIAL MENISECTOMY Right 09/19/2015   Procedure: KNEE ARTHROSCOPY WITH PARTIAL MEDIAL MENISECTOMY;  Surgeon: Norleen Gavel, MD;  Location: [facility];  Service: Orthopedics;  Laterality: Right;   ROTATOR CUFF REPAIR Left    with  revision   spider bite     black widow or brown recluse   TOE FUSION Left    TOTAL HIP ARTHROPLASTY Right 06/15/2023   Procedure: TOTAL HIP ARTHROPLASTY ANTERIOR APPROACH;  Surgeon: Gavel Norleen, MD;  Location: [facility];  Service: Orthopedics;  Laterality: Right;   TOTAL KNEE ARTHROPLASTY Right 03/31/2018   Procedure: RIGHT TOTAL KNEE ARTHROPLASTY;  Surgeon: Gavel Norleen, MD;  Location: [facility];  Service: Orthopedics;  Laterality: Right;   TOTAL KNEE REVISION Right 11/04/2019   Procedure: RIGHT TOTAL KNEE REVISION;  Surgeon: Gavel Norleen, MD;  Location: [facility];  Service: Orthopedics;  Laterality: Right;   TRIGGER FINGER RELEASE Right    Patient Active Problem List   Diagnosis Date Noted   Primary osteoarthritis of right hip 06/14/2023   Meralgia paraesthetica, right 06/10/2023   Spondylosis without myelopathy or radiculopathy, lumbar region 06/10/2023   Painful total knee replacement, right 11/04/2019   Arthrofibrosis of knee joint, right 11/04/2019   S/P revision of total knee, right 11/04/2019   Primary osteoarthritis of right knee 03/31/2018   Hypertension 12/02/2011   Obesity (BMI 30-39.9) 12/02/2011    PCP: [facility]  REFERRING PROVIDER: Gavel Norleen, MD  REFERRING DIAG: S/P right hip trochanteric bursectomy and IT band release  THERAPY DIAG:  Other symptoms and signs involving the musculoskeletal system  Pain in right hip  Other abnormalities of gait and mobility  Muscle weakness (generalized)  Rationale for Evaluation and Treatment: Rehabilitation  ONSET DATE: 3 weeks ago  SUBJECTIVE:   SUBJECTIVE STATEMENT: Pt reports that he had some soreness after land session, but felt better the next day.   PERTINENT HISTORY: Hx R THA, R TKA, hx LBP, HTN  PAIN:  Are you having pain? Yes: NPRS scale: 5/10 Pain location: R hip, Rt groin and Rt distal lateral knee Pain description: aching, sharp Aggravating factors: ROM,  Relieving factors:  rest  PRECAUTIONS: None  WEIGHT BEARING RESTRICTIONS: No  FALLS:  Has patient fallen in last 6 months? No  PLOF: Independent  PATIENT GOALS:  a little bit of relief  OBJECTIVE: (objective measures from initial evaluation unless otherwise dated)  MRI 8/25 right hip  IMPRESSION: 1. Right total hip arthroplasty with susceptibility artifact partially obscuring the adjacent soft tissue and osseous structures. No periarticular fluid collection or osteolysis. 2. Large amount of fluid in the right greater trochanteric bursa consistent with bursitis. 3. Mild osteoarthritis of the left hip.  PATIENT SURVEYS:  LEFS  Extreme difficulty/unable (0), Quite a bit of difficulty (1), Moderate difficulty (2), Little difficulty (3), No difficulty (4) Survey date:  07/07/24  Any of your usual work, housework or school activities 1  2. Usual hobbies, recreational or sporting activities 1  3. Getting into/out of the bath 1  4. Walking between rooms 2  5. Putting on socks/shoes 1  6. Squatting  2  7. Lifting an object, like a bag of groceries from the floor 2  8. Performing light activities around your home 2  9. Performing heavy activities around your home 0  10. Getting into/out of a car 2  11. Walking 2 blocks 0  12. Walking 1 mile 0  13. Going up/down 10 stairs (1 flight) 1  14. Standing for 1 hour 1  15.  sitting for 1 hour 2  16. Running on even ground 0  17. Running on uneven ground 0  18. Making sharp turns while running fast 0  19. Hopping  0  20. Rolling over in bed 1  Score total:  19/80     COGNITION: Overall cognitive status: Within functional limits for tasks assessed     SENSATION: WFL  POSTURE: No Significant postural limitations  PALPATION: TTP R greater Troch   LOWER EXTREMITY ROM:  Active ROM Right eval Left eval  Hip flexion    Hip extension    Hip abduction    Hip adduction    Hip internal rotation    Hip external rotation    Knee flexion    Knee  extension    Ankle dorsiflexion    Ankle plantarflexion    Ankle inversion    Ankle eversion     (Blank rows = not tested) *= pain/symptoms  LOWER EXTREMITY MMT:  MMT Right eval Left eval  Hip flexion 4+ 5  Hip extension 4 4+  Hip abduction (sidelying) 4-   Hip adduction    Hip internal rotation    Hip external rotation    Knee flexion 5 5  Knee extension 5 5  Ankle dorsiflexion    Ankle plantarflexion    Ankle inversion    Ankle eversion     (Blank rows = not tested) *= pain/symptoms  FUNCTIONAL TESTS:  5 times sit to stand: 26.97 seconds  Stairs: 7 inch, step too pattern, uses LLE  GAIT: Distance walked: 100 feet  Assistive device utilized: None Level of assistance: Complete Independence Comments: antalgic on RLE with truncal lean to R   TODAY'S TREATMENT:  09/01/24 Pt seen for aquatic therapy today.  Treatment took place in water  3.5-4.75 ft in depth at the [facility] pool. Temp of water  was 91.  Pt entered/exited the pool via stairs independently in step-to pattern with bil rail.  - unsupported walking forward/ backward/side stepping prior to session starting - backward walking lunges (some pain in Rt knee) - vertical squats -> side stepping into vertical squat R/L -plank at bench:  hip/knee flexion to hip/knee ext x 15; fire hydrants x 10 each LE -thick yellow noodle stomp LE  hip in neutral, and into hip abdct, 2 sets of 10 reps (1 fast, 1 slow, each LE) -standing balance on thick yellow noodle :wide stance rolling heel/to->slow march -> squats x 10 - runners step ups x 10 each LE. -seated on 3rd step flutter kicking/hip flex/ext; hip add/abd,  3 sets 10 slow/20 fast. - STS from 3rd step with 4 sec decent each rep x 12 - cycling on thick white noodle  08/27/24 NuStep x 6 min  Manual: IASTM with Massage gun to R ITB and quads   LF leg extension 45# 3x10 LF leg press 110# x10, 140# 2x10 RDLs with 3# dowel 2x10 Active hamstring stretch 2x10 with 5 second hold   08/24/24 Pt seen for aquatic therapy today.  Treatment took place in water  3.5-4.75 ft in depth at the [facility] pool. Temp of water  was 91.  Pt entered/exited the pool via stairs independently in step-to pattern with bil rail.  - unsupported walking forward/ backward/side stepping  -solid black noodle stomp rle  hip in neutral, then externally rotated 2 sets of 10 reps slow/10 fast opposite ue support on wall -standing balance on black noodle :wide stance rolling heel/to->balancing directly on noodle->squats x 7 -FT in position above standing balance challenge on noodle holding x 10s - step ups leading R/L x 10 unsupported -2nd step toe tap x 10 alternating - runners step ups x 10.  Good SLS balance -seated on 3rd step flutter kicking/hip flex/ext; hip add/abd 3 sets 10 slow/20 fast. - STS from 3rd step with cues for immediate standing balance then decent with hip hinge. - side lunge with yellow HB ue add/abd (adductor stretch)   08/19/24  Nu step x67min L5  Standing hip abduction/extension 2x10ea/bil PROM R hip 1x10 clamshell with blue band  3x10 bridges   3x10 leg press 140lbs 3x30 seconds prone quad stretch with strap 2x10 partial lunges  2x10 squats    08/16/24 Pt seen for aquatic therapy today.  Treatment took place in water  3.5-4.75 ft in depth at the [facility] pool. Temp of water  was 91.  Pt entered/exited the pool via stairs independently in step-to pattern with bil rail.   - unsupported walking forward/ backward/side stepping  - step ups leading R/L x 10 unsupported-> runners step ups x 10.  VC and demonstration for execution - Forward split squats 2 widths yellow HB  - UE on yellow hand floats side lunge x 4 widths. Wide squat increased stretch -figure 4 stretch at quad stretch at steps -solid noodle stomp rle   hip in neutral 2 x 10 then externally rotated x10 -seated on 3rd step flutter kicking/hip flex/ext; hip add/abd 3 sets 20 slow/20 fast.   08/04/24 Manual Therapy: STM to right IT band, iliopsoas, and quad   Therapeutic Exercise:  1x10 clamshell with blue band  2x10 bridges   3x10 leg press 70# for 10 reps, 100# for 10 reps  3x30 seconds prone quad  stretch with strap  Therapeutic Activity:  2x10 partial lunges  2x10 squats   08/02/24 Pt seen for aquatic therapy today.  Treatment took place in water  3.5-4.75 ft in depth at the [facility] pool. Temp of water  was 91.  Pt entered/exited the pool via stairs independently in step-to pattern with bil rail.  Manual therapy prior to entering pool: MFR to Rt iliopsoas  - unsupported walking forward/ backward/side stepping with long strides for hip flexor stretch - UE on wall: split squats x 10 each LE; single leg clams x 10 each;   - UE on yellow hand floats side lunge x 2 widths - plank on bench: hip extension alternating x 10; mountain climbers 2 x 5 - prone suspension using noodle : hip flex/ext; hip add/abd  Date:  07/26/2024 -PROM R hip -STM to proximal ihip flexors, HS -STM/roller to lateral HS  - Bridge 2x10 - S/l clam 2x10 - Side stepping with BLKTB around ankles x5 laps at rail - Squats 2x15 -partial lunges x10ea - Modified thomas stretch with focus on hip flexors  - Prone HS curls x20 Standing hip extension 2x20ea  Date:  07/23/2024 -STM to lateral HS  - Attempted SL Bridges with pain in groin - S/l clam 2x10 - Side stepping with BLKTB around ankles - Squats with BLKTB around knees - Discussion about compensations due to multiple surgeries.  - Modified thomas stretch with focus on hip flexors  - Prone HS curls with RTB resistance   PATIENT EDUCATION:  Education details: exercise progression/ modification Person educated: Patient Education method: Programmer, multimedia, Demonstration,  Education comprehension:  verbalized understanding, returned demonstration, verbal cues required, and tactile cues required  HOME EXERCISE PROGRAM: Access Code: [email or web address] URL: [email or web address] Date: 07/07/2024 Prepared by: Prentice Zaunegger  Exercises - Supine Bridge  - 1 x daily - 7 x weekly - 3 sets - 10 reps - Clamshell (Mirrored)  - 1 x daily - 7 x weekly - 3 sets - 10 reps  ASSESSMENT:  CLINICAL IMPRESSION: Pt tolerated treatment well today with no significant increase in pain (remained at 5/10).  Patient will continue to benefit from skilled therapy to address remaining limitations and return to prior level of function. Therapist to reassess goals next session, including LEFS; end of POC.   Eval: Patient a 58 y.o. y.o. male who was seen today for physical therapy evaluation and treatment for S/P right hip trochanteric bursectomy and IT band release. Patient presents with pain limited deficits in R hip strength, ROM, endurance, activity tolerance, and functional mobility with ADL. Patient is having to modify and restrict ADL as indicated by outcome measure score as well as subjective information and objective measures which is affecting overall participation. Patient will benefit from skilled physical therapy in order to improve function and reduce impairment.  OBJECTIVE IMPAIRMENTS: Abnormal gait, decreased activity tolerance, decreased balance, decreased endurance, decreased mobility, difficulty walking, decreased ROM, decreased strength, increased muscle spasms, impaired flexibility, improper body mechanics, and pain  ACTIVITY LIMITATIONS: lifting, bending, standing, squatting, stairs, transfers, locomotion level, and caring for others  PARTICIPATION LIMITATIONS: meal prep, cleaning, laundry, shopping, community activity, occupation, and yard work  PERSONAL FACTORS: Fitness and 3+ comorbidities: Hx R THA, R TKA, hx LBP, HTN are also affecting patient's functional outcome.   REHAB  POTENTIAL: Good  CLINICAL DECISION MAKING: Evolving/moderate complexity  EVALUATION COMPLEXITY: Moderate   GOALS: Goals reviewed with patient? Yes  SHORT TERM GOALS: Target date: 08/04/2024    Patient will be independent with HEP  in order to improve functional outcomes. Baseline: Goal status: Met 08/03/23  2.  Patient will report at least 25% improvement in symptoms/functional status for improved quality of life. Baseline: Goal status: In progress 08/25/23    LONG TERM GOALS: Target date: 09/01/2024    Patient will report at least 75% improvement in symptoms for improved quality of life. Baseline:  Goal status: INITIAL  2.  Patient will improve LEFS  score by at least 15 points in order to indicate improved tolerance to activity. Baseline:  Goal status: INITIAL  3.  Patient will be able to navigate stairs with reciprocal pattern without compensation in order to demonstrate improved LE strength. Baseline:  Goal status: In progress 08/24/24  4. Patient will demonstrate grade of 5/5 MMT grade in all tested musculature as evidence of improved strength to assist with stair ambulation and gait. Baseline:  Goal status: INITIAL  5.  Patient will be able to complete 5x STS in under 15 seconds in order to reduce the risk of falls. Baseline:  Goal status: INITIAL     PLAN:  PT FREQUENCY: 2x/week  PT DURATION: 8 weeks  PLANNED INTERVENTIONS: 97164- PT Re-evaluation, 97110-Therapeutic exercises, 97530- Therapeutic activity, 97112- Neuromuscular re-education, 97535- Self Care, [id number]- Manual therapy, [phone]- Gait training, [phone]- Orthotic Fit/training, [phone]- Canalith repositioning, [id number]- Aquatic Therapy, [phone]- Splinting, [phone]- Wound care (first 20 sq cm), 97598- Wound care (each additional 20 sq cm)Patient/Family education, Balance training, Stair training, Taping, Dry Needling, Joint mobilization, Joint manipulation, Spinal manipulation, Spinal mobilization, Scar mobilization,  and DME instructions.  PLAN FOR NEXT SESSION: see above   Delon Aquas, PTA 08/30/24 10:01 AM [facility] [address] [place], [place], [address] Phone: [phone]   Fax:  [phone]

## 2024-09-02 ENCOUNTER — Encounter (HOSPITAL_BASED_OUTPATIENT_CLINIC_OR_DEPARTMENT_OTHER): Payer: Self-pay | Admitting: Physical Therapy

## 2024-09-02 ENCOUNTER — Ambulatory Visit (HOSPITAL_BASED_OUTPATIENT_CLINIC_OR_DEPARTMENT_OTHER): Admitting: Physical Therapy

## 2024-09-02 DIAGNOSIS — R2689 Other abnormalities of gait and mobility: Secondary | ICD-10-CM

## 2024-09-02 DIAGNOSIS — M25551 Pain in right hip: Secondary | ICD-10-CM

## 2024-09-02 DIAGNOSIS — R29898 Other symptoms and signs involving the musculoskeletal system: Secondary | ICD-10-CM | POA: Diagnosis not present

## 2024-09-02 DIAGNOSIS — M6281 Muscle weakness (generalized): Secondary | ICD-10-CM

## 2024-09-02 NOTE — Therapy (Addendum)
 OUTPATIENT PHYSICAL THERAPY LOWER EXTREMITY TREATMENT/PROGRESS NOTE   Patient Name: Reginald [name] MRN: [id number] DOB:01/21/1966, 58 y.o., male Today's Date: 09/02/2024  Progress Note   Reporting Period 08/16/24 to 09/02/24   See note below for Objective Data and Assessment of Progress/Goals   END OF SESSION:  PT End of Session - 09/02/24 0758     Visit Number 15    Number of Visits 32    Date for Recertification  10/28/24    Authorization Type [employer]    Progress Note Due on Visit 20    PT Start Time 0800    PT Stop Time 0841    PT Time Calculation (min) 41 min    Activity Tolerance Patient tolerated treatment well    Behavior During Therapy [facility] for tasks assessed/performed          Past Medical History:  Diagnosis Date   Arthritis    Gallstones    Hypertension    Past Surgical History:  Procedure Laterality Date   CHOLECYSTECTOMY  12/03/2011   Procedure: LAPAROSCOPIC CHOLECYSTECTOMY WITH INTRAOPERATIVE CHOLANGIOGRAM;  Surgeon: Camellia CHRISTELLA Blush, MD;  Location: [facility] OR;  Service: General;  Laterality: N/A;  laparoscopic cholecystectomy with intraoperative cholangiogram   CHONDROPLASTY Right 09/19/2015   Procedure: CHONDROPLASTY;  Surgeon: Norleen Gavel, MD;  Location: [facility];  Service: Orthopedics;  Laterality: Right;   ENDOVENOUS ABLATION SAPHENOUS VEIN W/ LASER Right 01/13/2019   endovenous laser ablation right greater saphenous vein and stab phlebectomy 10-20 incisions right leg by Lonni Blade MD    IR ABLATE LIVER CRYOABLATION  04/03/2020   IR RADIOLOGIST EVAL & MGMT  03/28/2020   KNEE ARTHROSCOPY WITH LATERAL MENISECTOMY Right 09/19/2015   Procedure: KNEE ARTHROSCOPY WITH PARTIAL LATERAL MENISECTOMY;  Surgeon: Norleen Gavel, MD;  Location: [facility];  Service: Orthopedics;  Laterality: Right;   KNEE ARTHROSCOPY WITH MEDIAL MENISECTOMY Right 09/19/2015   Procedure: KNEE ARTHROSCOPY WITH PARTIAL MEDIAL MENISECTOMY;   Surgeon: Norleen Gavel, MD;  Location:  SURGERY CENTER;  Service: Orthopedics;  Laterality: Right;   ROTATOR CUFF REPAIR Left    with revision   spider bite     black widow or brown recluse   TOE FUSION Left    TOTAL HIP ARTHROPLASTY Right 06/15/2023   Procedure: TOTAL HIP ARTHROPLASTY ANTERIOR APPROACH;  Surgeon: Gavel Norleen, MD;  Location: [facility];  Service: Orthopedics;  Laterality: Right;   TOTAL KNEE ARTHROPLASTY Right 03/31/2018   Procedure: RIGHT TOTAL KNEE ARTHROPLASTY;  Surgeon: Gavel Norleen, MD;  Location: [facility];  Service: Orthopedics;  Laterality: Right;   TOTAL KNEE REVISION Right 11/04/2019   Procedure: RIGHT TOTAL KNEE REVISION;  Surgeon: Gavel Norleen, MD;  Location: [facility];  Service: Orthopedics;  Laterality: Right;   TRIGGER FINGER RELEASE Right    Patient Active Problem List   Diagnosis Date Noted   Primary osteoarthritis of right hip 06/14/2023   Meralgia paraesthetica, right 06/10/2023   Spondylosis without myelopathy or radiculopathy, lumbar region 06/10/2023   Painful total knee replacement, right 11/04/2019   Arthrofibrosis of knee joint, right 11/04/2019   S/P revision of total knee, right 11/04/2019   Primary osteoarthritis of right knee 03/31/2018   Hypertension 12/02/2011   Obesity (BMI 30-39.9) 12/02/2011    PCP: [facility]  REFERRING PROVIDER: Gavel Norleen, MD  REFERRING DIAG: S/P right hip trochanteric bursectomy and IT band release  THERAPY DIAG:  Other symptoms and signs involving the musculoskeletal system  Pain in right hip  Other abnormalities of gait and mobility  Muscle weakness (generalized)  Rationale for Evaluation and Treatment: Rehabilitation  ONSET DATE: 3 weeks ago  SUBJECTIVE:   SUBJECTIVE STATEMENT: Pt reports that he is not too bad today. Reports he is at 40%.   PERTINENT HISTORY: Hx R THA, R TKA, hx LBP, HTN  PAIN:  Are you having pain? Yes: NPRS scale: 5/10 Pain location: R hip, Rt  groin and Rt distal lateral knee Pain description: aching, sharp Aggravating factors: ROM,  Relieving factors: rest  PRECAUTIONS: None  WEIGHT BEARING RESTRICTIONS: No  FALLS:  Has patient fallen in last 6 months? No  PLOF: Independent  PATIENT GOALS:  a little bit of relief  OBJECTIVE: (objective measures from initial evaluation unless otherwise dated)  MRI 8/25 right hip  IMPRESSION: 1. Right total hip arthroplasty with susceptibility artifact partially obscuring the adjacent soft tissue and osseous structures. No periarticular fluid collection or osteolysis. 2. Large amount of fluid in the right greater trochanteric bursa consistent with bursitis. 3. Mild osteoarthritis of the left hip.  PATIENT SURVEYS:  LEFS  Extreme difficulty/unable (0), Quite a bit of difficulty (1), Moderate difficulty (2), Little difficulty (3), No difficulty (4) Survey date:  07/07/24 09/02/24  Any of your usual work, housework or school activities 1 1  2. Usual hobbies, recreational or sporting activities 1 2  3. Getting into/out of the bath 1 1  4. Walking between rooms 2 2  5. Putting on socks/shoes 1 1  6. Squatting  2 1  7. Lifting an object, like a bag of groceries from the floor 2 2  8. Performing light activities around your home 2 2  9. Performing heavy activities around your home 0 0  10. Getting into/out of a car 2 1  11. Walking 2 blocks 0 1  12. Walking 1 mile 0 0  13. Going up/down 10 stairs (1 flight) 1 1  14. Standing for 1 hour 1 1  15.  sitting for 1 hour 2 2  16. Running on even ground 0 0  17. Running on uneven ground 0 0  18. Making sharp turns while running fast 0 0  19. Hopping  0 0  20. Rolling over in bed 1 1  Score total:  19/80 19/80     COGNITION: Overall cognitive status: Within functional limits for tasks assessed     SENSATION: WFL  POSTURE: No Significant postural limitations  PALPATION: TTP R greater Troch   LOWER EXTREMITY ROM:  Active ROM  Right eval Left eval  Hip flexion    Hip extension    Hip abduction    Hip adduction    Hip internal rotation    Hip external rotation    Knee flexion    Knee extension    Ankle dorsiflexion    Ankle plantarflexion    Ankle inversion    Ankle eversion     (Blank rows = not tested) *= pain/symptoms  LOWER EXTREMITY MMT:  MMT Right eval Left eval Right 09/02/24 Left 09/02/24  Hip flexion 4+ 5 4+ 5  Hip extension 4 4+ 4+ 5  Hip abduction (sidelying) 4-  4+ 5  Hip adduction      Hip internal rotation      Hip external rotation      Knee flexion 5 5 5 5   Knee extension 5 5 5 5   Ankle dorsiflexion      Ankle plantarflexion      Ankle inversion  Ankle eversion       (Blank rows = not tested) *= pain/symptoms  FUNCTIONAL TESTS:  5 times sit to stand: 26.97 seconds  Stairs: 7 inch, step too pattern, uses LLE 5xSTS 09/02/24: 17.38 without use of UEs  GAIT: Distance walked: 100 feet  Assistive device utilized: None Level of assistance: Complete Independence Comments: antalgic on RLE with truncal lean to R   TODAY'S TREATMENT:                                                                                                                              09/02/24 NuStep 5 min lvl 4  Reassessment  IASTM and STM to left ITB, piriformis, glute med, and biceps femoris  Seated hamstring stretch x30 seconds  Leg press 3x10 145#  09/01/24 Pt seen for aquatic therapy today.  Treatment took place in water  3.5-4.75 ft in depth at the [facility] pool. Temp of water  was 91.  Pt entered/exited the pool via stairs independently in step-to pattern with bil rail.  - unsupported walking forward/ backward/side stepping prior to session starting - backward walking lunges (some pain in Rt knee) - vertical squats -> side stepping into vertical squat R/L -plank at bench:  hip/knee flexion to hip/knee ext x 15; fire hydrants x 10 each LE -thick yellow noodle stomp LE  hip in  neutral, and into hip abdct, 2 sets of 10 reps (1 fast, 1 slow, each LE) -standing balance on thick yellow noodle :wide stance rolling heel/to->slow march -> squats x 10 - runners step ups x 10 each LE. -seated on 3rd step flutter kicking/hip flex/ext; hip add/abd,  3 sets 10 slow/20 fast. - STS from 3rd step with 4 sec decent each rep x 12 - cycling on thick white noodle  08/27/24 NuStep x 6 min  Manual: IASTM with Massage gun to R ITB and quads  LF leg extension 45# 3x10 LF leg press 110# x10, 140# 2x10 RDLs with 3# dowel 2x10 Active hamstring stretch 2x10 with 5 second hold   08/24/24 Pt seen for aquatic therapy today.  Treatment took place in water  3.5-4.75 ft in depth at the [facility] pool. Temp of water  was 91.  Pt entered/exited the pool via stairs independently in step-to pattern with bil rail.  - unsupported walking forward/ backward/side stepping  -solid black noodle stomp rle  hip in neutral, then externally rotated 2 sets of 10 reps slow/10 fast opposite ue support on wall -standing balance on black noodle :wide stance rolling heel/to->balancing directly on noodle->squats x 7 -FT in position above standing balance challenge on noodle holding x 10s - step ups leading R/L x 10 unsupported -2nd step toe tap x 10 alternating - runners step ups x 10.  Good SLS balance -seated on 3rd step flutter kicking/hip flex/ext; hip add/abd 3 sets 10 slow/20 fast. - STS from 3rd step with cues for immediate standing balance then decent with hip hinge. - side  lunge with yellow HB ue add/abd (adductor stretch)  08/19/24 Nu step x69min L5  Standing hip abduction/extension 2x10ea/bil PROM R hip 1x10 clamshell with blue band  3x10 bridges   3x10 leg press 140lbs 3x30 seconds prone quad stretch with strap 2x10 partial lunges  2x10 squats   08/16/24 Pt seen for aquatic therapy today.  Treatment took place in water  3.5-4.75 ft in depth at the [facility] pool. Temp of  water  was 91.  Pt entered/exited the pool via stairs independently in step-to pattern with bil rail.  - unsupported walking forward/ backward/side stepping  - step ups leading R/L x 10 unsupported-> runners step ups x 10.  VC and demonstration for execution - Forward split squats 2 widths yellow HB  - UE on yellow hand floats side lunge x 4 widths. Wide squat increased stretch -figure 4 stretch at quad stretch at steps -solid noodle stomp rle  hip in neutral 2 x 10 then externally rotated x10 -seated on 3rd step flutter kicking/hip flex/ext; hip add/abd 3 sets 20 slow/20 fast.   08/04/24 Manual Therapy: STM to right IT band, iliopsoas, and quad   Therapeutic Exercise:  1x10 clamshell with blue band  2x10 bridges   3x10 leg press 70# for 10 reps, 100# for 10 reps  3x30 seconds prone quad stretch with strap  Therapeutic Activity:  2x10 partial lunges  2x10 squats   08/02/24 Pt seen for aquatic therapy today.  Treatment took place in water  3.5-4.75 ft in depth at the [facility] pool. Temp of water  was 91.  Pt entered/exited the pool via stairs independently in step-to pattern with bil rail.  Manual therapy prior to entering pool: MFR to Rt iliopsoas  - unsupported walking forward/ backward/side stepping with long strides for hip flexor stretch - UE on wall: split squats x 10 each LE; single leg clams x 10 each;   - UE on yellow hand floats side lunge x 2 widths - plank on bench: hip extension alternating x 10; mountain climbers 2 x 5 - prone suspension using noodle : hip flex/ext; hip add/abd  Date:  07/26/2024 -PROM R hip -STM to proximal ihip flexors, HS -STM/roller to lateral HS  - Bridge 2x10 - S/l clam 2x10 - Side stepping with BLKTB around ankles x5 laps at rail - Squats 2x15 -partial lunges x10ea - Modified thomas stretch with focus on hip flexors  - Prone HS curls x20 Standing hip extension 2x20ea  Date:  07/23/2024 -STM to lateral HS  - Attempted SL  Bridges with pain in groin - S/l clam 2x10 - Side stepping with BLKTB around ankles - Squats with BLKTB around knees - Discussion about compensations due to multiple surgeries.  - Modified thomas stretch with focus on hip flexors  - Prone HS curls with RTB resistance   PATIENT EDUCATION:  Education details: exercise progression/ modification Person educated: Patient Education method: Programmer, multimedia, Demonstration,  Education comprehension: verbalized understanding, returned demonstration, verbal cues required, and tactile cues required  HOME EXERCISE PROGRAM: Access Code: [email or web address] URL: [email or web address] Date: 07/07/2024 Prepared by: Prentice Zaunegger  Exercises - Supine Bridge  - 1 x daily - 7 x weekly - 3 sets - 10 reps - Clamshell (Mirrored)  - 1 x daily - 7 x weekly - 3 sets - 10 reps  ASSESSMENT:  CLINICAL IMPRESSION: Patient has met 2/2 short term goals and 0/5 long term goals with ability to complete HEP and improvement in symptoms, strength, ROM, activity tolerance, gait, balance, and functional mobility.  Remaining goals not met due to continued deficits in strength, functional mobility, activity tolerance, and pain. Patient has made good progress toward remaining goals. Tolerated exercises well. Manual utilized to decrease hyperactive musculature in LLE. Exercises focused on quad and glute strengthening. Patient will continue to benefit from skilled physical therapy in order to improve function and reduce impairment.    Eval: Patient a 58 y.o. y.o. male who was seen today for physical therapy evaluation and treatment for S/P right hip trochanteric bursectomy and IT band release. Patient presents with pain limited deficits in R hip strength, ROM, endurance, activity tolerance, and functional mobility with ADL. Patient is having to modify and restrict ADL as indicated by outcome measure score as well as subjective information and objective measures which is  affecting overall participation. Patient will benefit from skilled physical therapy in order to improve function and reduce impairment.  OBJECTIVE IMPAIRMENTS: Abnormal gait, decreased activity tolerance, decreased balance, decreased endurance, decreased mobility, difficulty walking, decreased ROM, decreased strength, increased muscle spasms, impaired flexibility, improper body mechanics, and pain  ACTIVITY LIMITATIONS: lifting, bending, standing, squatting, stairs, transfers, locomotion level, and caring for others  PARTICIPATION LIMITATIONS: meal prep, cleaning, laundry, shopping, community activity, occupation, and yard work  PERSONAL FACTORS: Fitness and 3+ comorbidities: Hx R THA, R TKA, hx LBP, HTN are also affecting patient's functional outcome.   REHAB POTENTIAL: Good  CLINICAL DECISION MAKING: Evolving/moderate complexity  EVALUATION COMPLEXITY: Moderate   GOALS: Goals reviewed with patient? Yes  SHORT TERM GOALS: Target date: 08/04/2024    Patient will be independent with HEP in order to improve functional outcomes. Baseline: Goal status: Met 08/03/23  2.  Patient will report at least 25% improvement in symptoms/functional status for improved quality of life. Baseline: Goal status: Met 09/02/24   LONG TERM GOALS: Target date: 09/01/2024  Patient will report at least 75% improvement in symptoms for improved quality of life. Baseline:  Goal status: Progressing 09/02/24 (40%)  2.  Patient will improve LEFS  score by at least 15 points in order to indicate improved tolerance to activity. Baseline:  Goal status: Progressing 09/02/24  3.  Patient will be able to navigate stairs with reciprocal pattern without compensation in order to demonstrate improved LE strength. Baseline:  Goal status: Progressing 09/02/24  4. Patient will demonstrate grade of 5/5 MMT grade in all tested musculature as evidence of improved strength to assist with stair ambulation and  gait. Baseline:  Goal status: Progressing 09/02/24  5.  Patient will be able to complete 5x STS in under 15 seconds in order to reduce the risk of falls. Baseline:  Goal status: Progressing 09/02/24  PLAN:  PT FREQUENCY: 2x/week  PT DURATION: 8 weeks  PLANNED INTERVENTIONS: 97164- PT Re-evaluation, 97110-Therapeutic exercises, 97530- Therapeutic activity, 97112- Neuromuscular re-education, 97535- Self Care, [id number]- Manual therapy, [phone]- Gait training, [phone]- Orthotic Fit/training, [phone]- Canalith repositioning, [id number]- Aquatic Therapy, [phone]- Splinting, [phone]- Wound care (first 20 sq cm), 97598- Wound care (each additional 20 sq cm)Patient/Family education, Balance training, Stair training, Taping, Dry Needling, Joint mobilization, Joint manipulation, Spinal manipulation, Spinal mobilization, Scar mobilization, and DME instructions.  PLAN FOR NEXT SESSION: hip strength, functional strength   Lili Finder, Student-PT 09/02/2024, 8:41 AM   This entire session was performed under direct supervision and direction of a licensed therapist/therapist assistant . I have personally read, edited and approve of the note as written. 8:56 AM, 09/02/24 Prentice CANDIE Stains PT, DPT Physical Therapist at [facility]

## 2024-09-06 DIAGNOSIS — Z79891 Long term (current) use of opiate analgesic: Secondary | ICD-10-CM | POA: Diagnosis not present

## 2024-09-06 DIAGNOSIS — G8929 Other chronic pain: Secondary | ICD-10-CM | POA: Diagnosis not present

## 2024-09-06 DIAGNOSIS — Z96641 Presence of right artificial hip joint: Secondary | ICD-10-CM | POA: Diagnosis not present

## 2024-09-06 DIAGNOSIS — M25551 Pain in right hip: Secondary | ICD-10-CM | POA: Diagnosis not present

## 2024-09-06 DIAGNOSIS — Z96651 Presence of right artificial knee joint: Secondary | ICD-10-CM | POA: Diagnosis not present

## 2024-09-06 DIAGNOSIS — M2550 Pain in unspecified joint: Secondary | ICD-10-CM | POA: Diagnosis not present

## 2024-09-06 DIAGNOSIS — M25561 Pain in right knee: Secondary | ICD-10-CM | POA: Diagnosis not present

## 2024-09-06 NOTE — Therapy (Unsigned)
 OUTPATIENT PHYSICAL THERAPY LOWER EXTREMITY TREATMENT/PROGRESS NOTE   Patient Name: Reginald [name] MRN: [id number] DOB:12/13/1965, 58 y.o., male Today's Date: 09/06/2024  Progress Note   Reporting Period 08/16/24 to 09/02/24   See note below for Objective Data and Assessment of Progress/Goals   END OF SESSION:    Past Medical History:  Diagnosis Date   Arthritis    Gallstones    Hypertension    Past Surgical History:  Procedure Laterality Date   CHOLECYSTECTOMY  12/03/2011   Procedure: LAPAROSCOPIC CHOLECYSTECTOMY WITH INTRAOPERATIVE CHOLANGIOGRAM;  Surgeon: Camellia CHRISTELLA Blush, MD;  Location: [facility];  Service: General;  Laterality: N/A;  laparoscopic cholecystectomy with intraoperative cholangiogram   CHONDROPLASTY Right 09/19/2015   Procedure: CHONDROPLASTY;  Surgeon: Norleen Gavel, MD;  Location: [facility];  Service: Orthopedics;  Laterality: Right;   ENDOVENOUS ABLATION SAPHENOUS VEIN W/ LASER Right 01/13/2019   endovenous laser ablation right greater saphenous vein and stab phlebectomy 10-20 incisions right leg by Lonni Blade MD    IR ABLATE LIVER CRYOABLATION  04/03/2020   IR RADIOLOGIST EVAL & MGMT  03/28/2020   KNEE ARTHROSCOPY WITH LATERAL MENISECTOMY Right 09/19/2015   Procedure: KNEE ARTHROSCOPY WITH PARTIAL LATERAL MENISECTOMY;  Surgeon: Norleen Gavel, MD;  Location: [facility];  Service: Orthopedics;  Laterality: Right;   KNEE ARTHROSCOPY WITH MEDIAL MENISECTOMY Right 09/19/2015   Procedure: KNEE ARTHROSCOPY WITH PARTIAL MEDIAL MENISECTOMY;  Surgeon: Norleen Gavel, MD;  Location:  SURGERY CENTER;  Service: Orthopedics;  Laterality: Right;   ROTATOR CUFF REPAIR Left    with revision   spider bite     black widow or brown recluse   TOE FUSION Left    TOTAL HIP ARTHROPLASTY Right 06/15/2023   Procedure: TOTAL HIP ARTHROPLASTY ANTERIOR APPROACH;  Surgeon: Gavel Norleen, MD;  Location: [facility];  Service: Orthopedics;  Laterality:  Right;   TOTAL KNEE ARTHROPLASTY Right 03/31/2018   Procedure: RIGHT TOTAL KNEE ARTHROPLASTY;  Surgeon: Gavel Norleen, MD;  Location: [facility];  Service: Orthopedics;  Laterality: Right;   TOTAL KNEE REVISION Right 11/04/2019   Procedure: RIGHT TOTAL KNEE REVISION;  Surgeon: Gavel Norleen, MD;  Location: [facility];  Service: Orthopedics;  Laterality: Right;   TRIGGER FINGER RELEASE Right    Patient Active Problem List   Diagnosis Date Noted   Primary osteoarthritis of right hip 06/14/2023   Meralgia paraesthetica, right 06/10/2023   Spondylosis without myelopathy or radiculopathy, lumbar region 06/10/2023   Painful total knee replacement, right 11/04/2019   Arthrofibrosis of knee joint, right 11/04/2019   S/P revision of total knee, right 11/04/2019   Primary osteoarthritis of right knee 03/31/2018   Hypertension 12/02/2011   Obesity (BMI 30-39.9) 12/02/2011    PCP: [facility]  REFERRING PROVIDER: Gavel Norleen, MD  REFERRING DIAG: S/P right hip trochanteric bursectomy and IT band release  THERAPY DIAG:  No diagnosis found.  Rationale for Evaluation and Treatment: Rehabilitation  ONSET DATE: 3 weeks ago  SUBJECTIVE:   SUBJECTIVE STATEMENT: Pt reports that he is not too bad today. Reports he is at 40%.   PERTINENT HISTORY: Hx R THA, R TKA, hx LBP, HTN  PAIN:  Are you having pain? Yes: NPRS scale: 5/10 Pain location: R hip, Rt groin and Rt distal lateral knee Pain description: aching, sharp Aggravating factors: ROM,  Relieving factors: rest  PRECAUTIONS: None  WEIGHT BEARING RESTRICTIONS: No  FALLS:  Has patient fallen in last 6 months? No  PLOF: Independent  PATIENT GOALS:  a little bit of relief  OBJECTIVE: (objective measures from initial evaluation unless otherwise dated)  MRI 8/25 right hip  IMPRESSION: 1. Right total hip arthroplasty with susceptibility artifact partially obscuring the adjacent soft tissue and osseous structures. No  periarticular fluid collection or osteolysis. 2. Large amount of fluid in the right greater trochanteric bursa consistent with bursitis. 3. Mild osteoarthritis of the left hip.  PATIENT SURVEYS:  LEFS  Extreme difficulty/unable (0), Quite a bit of difficulty (1), Moderate difficulty (2), Little difficulty (3), No difficulty (4) Survey date:  07/07/24 09/02/24  Any of your usual work, housework or school activities 1 1  2. Usual hobbies, recreational or sporting activities 1 2  3. Getting into/out of the bath 1 1  4. Walking between rooms 2 2  5. Putting on socks/shoes 1 1  6. Squatting  2 1  7. Lifting an object, like a bag of groceries from the floor 2 2  8. Performing light activities around your home 2 2  9. Performing heavy activities around your home 0 0  10. Getting into/out of a car 2 1  11. Walking 2 blocks 0 1  12. Walking 1 mile 0 0  13. Going up/down 10 stairs (1 flight) 1 1  14. Standing for 1 hour 1 1  15.  sitting for 1 hour 2 2  16. Running on even ground 0 0  17. Running on uneven ground 0 0  18. Making sharp turns while running fast 0 0  19. Hopping  0 0  20. Rolling over in bed 1 1  Score total:  19/80 19/80     COGNITION: Overall cognitive status: Within functional limits for tasks assessed     SENSATION: WFL  POSTURE: No Significant postural limitations  PALPATION: TTP R greater Troch   LOWER EXTREMITY ROM:  Active ROM Right eval Left eval  Hip flexion    Hip extension    Hip abduction    Hip adduction    Hip internal rotation    Hip external rotation    Knee flexion    Knee extension    Ankle dorsiflexion    Ankle plantarflexion    Ankle inversion    Ankle eversion     (Blank rows = not tested) *= pain/symptoms  LOWER EXTREMITY MMT:  MMT Right eval Left eval Right 09/02/24 Left 09/02/24  Hip flexion 4+ 5 4+ 5  Hip extension 4 4+ 4+ 5  Hip abduction (sidelying) 4-  4+ 5  Hip adduction      Hip internal rotation      Hip  external rotation      Knee flexion 5 5 5 5   Knee extension 5 5 5 5   Ankle dorsiflexion      Ankle plantarflexion      Ankle inversion      Ankle eversion       (Blank rows = not tested) *= pain/symptoms  FUNCTIONAL TESTS:  5 times sit to stand: 26.97 seconds  Stairs: 7 inch, step too pattern, uses LLE 5xSTS 09/02/24: 17.38 without use of UEs  GAIT: Distance walked: 100 feet  Assistive device utilized: None Level of assistance: Complete Independence Comments: antalgic on RLE with truncal lean to R   TODAY'S TREATMENT:  09/02/24 NuStep 5 min lvl 4  Reassessment  IASTM and STM to left ITB, piriformis, glute med, and biceps femoris  Seated hamstring stretch x30 seconds  Leg press 3x10 145#  09/01/24 Pt seen for aquatic therapy today.  Treatment took place in water  3.5-4.75 ft in depth at the [facility] pool. Temp of water  was 91.  Pt entered/exited the pool via stairs independently in step-to pattern with bil rail.  - unsupported walking forward/ backward/side stepping prior to session starting - backward walking lunges (some pain in Rt knee) - vertical squats -> side stepping into vertical squat R/L -plank at bench:  hip/knee flexion to hip/knee ext x 15; fire hydrants x 10 each LE -thick yellow noodle stomp LE  hip in neutral, and into hip abdct, 2 sets of 10 reps (1 fast, 1 slow, each LE) -standing balance on thick yellow noodle :wide stance rolling heel/to->slow march -> squats x 10 - runners step ups x 10 each LE. -seated on 3rd step flutter kicking/hip flex/ext; hip add/abd,  3 sets 10 slow/20 fast. - STS from 3rd step with 4 sec decent each rep x 12 - cycling on thick white noodle  08/27/24 NuStep x 6 min  Manual: IASTM with Massage gun to R ITB and quads  LF leg extension 45# 3x10 LF leg press 110# x10, 140# 2x10 RDLs with 3# dowel  2x10 Active hamstring stretch 2x10 with 5 second hold   08/24/24 Pt seen for aquatic therapy today.  Treatment took place in water  3.5-4.75 ft in depth at the [facility] pool. Temp of water  was 91.  Pt entered/exited the pool via stairs independently in step-to pattern with bil rail.  - unsupported walking forward/ backward/side stepping  -solid black noodle stomp rle  hip in neutral, then externally rotated 2 sets of 10 reps slow/10 fast opposite ue support on wall -standing balance on black noodle :wide stance rolling heel/to->balancing directly on noodle->squats x 7 -FT in position above standing balance challenge on noodle holding x 10s - step ups leading R/L x 10 unsupported -2nd step toe tap x 10 alternating - runners step ups x 10.  Good SLS balance -seated on 3rd step flutter kicking/hip flex/ext; hip add/abd 3 sets 10 slow/20 fast. - STS from 3rd step with cues for immediate standing balance then decent with hip hinge. - side lunge with yellow HB ue add/abd (adductor stretch)  08/19/24 Nu step x68min L5  Standing hip abduction/extension 2x10ea/bil PROM R hip 1x10 clamshell with blue band  3x10 bridges   3x10 leg press 140lbs 3x30 seconds prone quad stretch with strap 2x10 partial lunges  2x10 squats   08/16/24 Pt seen for aquatic therapy today.  Treatment took place in water  3.5-4.75 ft in depth at the [facility] pool. Temp of water  was 91.  Pt entered/exited the pool via stairs independently in step-to pattern with bil rail.  - unsupported walking forward/ backward/side stepping  - step ups leading R/L x 10 unsupported-> runners step ups x 10.  VC and demonstration for execution - Forward split squats 2 widths yellow HB  - UE on yellow hand floats side lunge x 4 widths. Wide squat increased stretch -figure 4 stretch at quad stretch at steps -solid noodle stomp rle  hip in neutral 2 x 10 then externally rotated x10 -seated on 3rd step flutter  kicking/hip flex/ext; hip add/abd 3 sets 20 slow/20 fast.   08/04/24 Manual Therapy: STM to right IT band, iliopsoas, and quad  Therapeutic Exercise:  1x10 clamshell with blue band  2x10 bridges   3x10 leg press 70# for 10 reps, 100# for 10 reps  3x30 seconds prone quad stretch with strap  Therapeutic Activity:  2x10 partial lunges  2x10 squats   08/02/24 Pt seen for aquatic therapy today.  Treatment took place in water  3.5-4.75 ft in depth at the [facility] pool. Temp of water  was 91.  Pt entered/exited the pool via stairs independently in step-to pattern with bil rail.  Manual therapy prior to entering pool: MFR to Rt iliopsoas  - unsupported walking forward/ backward/side stepping with long strides for hip flexor stretch - UE on wall: split squats x 10 each LE; single leg clams x 10 each;   - UE on yellow hand floats side lunge x 2 widths - plank on bench: hip extension alternating x 10; mountain climbers 2 x 5 - prone suspension using noodle : hip flex/ext; hip add/abd  Date:  07/26/2024 -PROM R hip -STM to proximal ihip flexors, HS -STM/roller to lateral HS  - Bridge 2x10 - S/l clam 2x10 - Side stepping with BLKTB around ankles x5 laps at rail - Squats 2x15 -partial lunges x10ea - Modified thomas stretch with focus on hip flexors  - Prone HS curls x20 Standing hip extension 2x20ea  Date:  07/23/2024 -STM to lateral HS  - Attempted SL Bridges with pain in groin - S/l clam 2x10 - Side stepping with BLKTB around ankles - Squats with BLKTB around knees - Discussion about compensations due to multiple surgeries.  - Modified thomas stretch with focus on hip flexors  - Prone HS curls with RTB resistance   PATIENT EDUCATION:  Education details: exercise progression/ modification Person educated: Patient Education method: Programmer, multimedia, Demonstration,  Education comprehension: verbalized understanding, returned demonstration, verbal cues required, and  tactile cues required  HOME EXERCISE PROGRAM: Access Code: [email or web address] URL: [email or web address] Date: 07/07/2024 Prepared by: Prentice Zaunegger  Exercises - Supine Bridge  - 1 x daily - 7 x weekly - 3 sets - 10 reps - Clamshell (Mirrored)  - 1 x daily - 7 x weekly - 3 sets - 10 reps  ASSESSMENT:  CLINICAL IMPRESSION: Patient has met 2/2 short term goals and 0/5 long term goals with ability to complete HEP and improvement in symptoms, strength, ROM, activity tolerance, gait, balance, and functional mobility. Remaining goals not met due to continued deficits in strength, functional mobility, activity tolerance, and pain. Patient has made good progress toward remaining goals. Tolerated exercises well. Manual utilized to decrease hyperactive musculature in LLE. Exercises focused on quad and glute strengthening. Patient will continue to benefit from skilled physical therapy in order to improve function and reduce impairment.    Eval: Patient a 58 y.o. y.o. male who was seen today for physical therapy evaluation and treatment for S/P right hip trochanteric bursectomy and IT band release. Patient presents with pain limited deficits in R hip strength, ROM, endurance, activity tolerance, and functional mobility with ADL. Patient is having to modify and restrict ADL as indicated by outcome measure score as well as subjective information and objective measures which is affecting overall participation. Patient will benefit from skilled physical therapy in order to improve function and reduce impairment.  OBJECTIVE IMPAIRMENTS: Abnormal gait, decreased activity tolerance, decreased balance, decreased endurance, decreased mobility, difficulty walking, decreased ROM, decreased strength, increased muscle spasms, impaired flexibility, improper body mechanics, and pain  ACTIVITY LIMITATIONS: lifting, bending, standing, squatting, stairs, transfers, locomotion level, and caring for  others  PARTICIPATION LIMITATIONS: meal prep, cleaning, laundry, shopping, community activity, occupation, and yard work  PERSONAL FACTORS: Fitness and 3+ comorbidities: Hx R THA, R TKA, hx LBP, HTN are also affecting patient's functional outcome.   REHAB POTENTIAL: Good  CLINICAL DECISION MAKING: Evolving/moderate complexity  EVALUATION COMPLEXITY: Moderate   GOALS: Goals reviewed with patient? Yes  SHORT TERM GOALS: Target date: 08/04/2024    Patient will be independent with HEP in order to improve functional outcomes. Baseline: Goal status: Met 08/03/23  2.  Patient will report at least 25% improvement in symptoms/functional status for improved quality of life. Baseline: Goal status: Met 09/02/24   LONG TERM GOALS: Target date: 09/01/2024  Patient will report at least 75% improvement in symptoms for improved quality of life. Baseline:  Goal status: Progressing 09/02/24 (40%)  2.  Patient will improve LEFS  score by at least 15 points in order to indicate improved tolerance to activity. Baseline:  Goal status: Progressing 09/02/24  3.  Patient will be able to navigate stairs with reciprocal pattern without compensation in order to demonstrate improved LE strength. Baseline:  Goal status: Progressing 09/02/24  4. Patient will demonstrate grade of 5/5 MMT grade in all tested musculature as evidence of improved strength to assist with stair ambulation and gait. Baseline:  Goal status: Progressing 09/02/24  5.  Patient will be able to complete 5x STS in under 15 seconds in order to reduce the risk of falls. Baseline:  Goal status: Progressing 09/02/24  PLAN:  PT FREQUENCY: 2x/week  PT DURATION: 8 weeks  PLANNED INTERVENTIONS: 97164- PT Re-evaluation, 97110-Therapeutic exercises, 97530- Therapeutic activity, 97112- Neuromuscular re-education, 97535- Self Care, [id number]- Manual therapy, [phone]- Gait training, [phone]- Orthotic Fit/training, [phone]- Canalith repositioning,  [id number]- Aquatic Therapy, [phone]- Splinting, [phone]- Wound care (first 20 sq cm), 97598- Wound care (each additional 20 sq cm)Patient/Family education, Balance training, Stair training, Taping, Dry Needling, Joint mobilization, Joint manipulation, Spinal manipulation, Spinal mobilization, Scar mobilization, and DME instructions.  PLAN FOR NEXT SESSION: hip strength, functional strength   Rojean JONELLE Batten, PT 09/06/2024, 1:16 PM

## 2024-09-07 ENCOUNTER — Ambulatory Visit (HOSPITAL_BASED_OUTPATIENT_CLINIC_OR_DEPARTMENT_OTHER): Admitting: Physical Therapy

## 2024-09-07 ENCOUNTER — Encounter (HOSPITAL_BASED_OUTPATIENT_CLINIC_OR_DEPARTMENT_OTHER): Payer: Self-pay | Admitting: Physical Therapy

## 2024-09-07 DIAGNOSIS — M25551 Pain in right hip: Secondary | ICD-10-CM

## 2024-09-07 DIAGNOSIS — R29898 Other symptoms and signs involving the musculoskeletal system: Secondary | ICD-10-CM | POA: Diagnosis not present

## 2024-09-07 DIAGNOSIS — R2689 Other abnormalities of gait and mobility: Secondary | ICD-10-CM

## 2024-09-07 DIAGNOSIS — M6281 Muscle weakness (generalized): Secondary | ICD-10-CM

## 2024-09-14 ENCOUNTER — Encounter (HOSPITAL_BASED_OUTPATIENT_CLINIC_OR_DEPARTMENT_OTHER): Payer: Self-pay | Admitting: Physical Therapy

## 2024-09-14 ENCOUNTER — Ambulatory Visit (HOSPITAL_BASED_OUTPATIENT_CLINIC_OR_DEPARTMENT_OTHER): Admitting: Physical Therapy

## 2024-09-14 DIAGNOSIS — M25551 Pain in right hip: Secondary | ICD-10-CM

## 2024-09-14 DIAGNOSIS — R29898 Other symptoms and signs involving the musculoskeletal system: Secondary | ICD-10-CM | POA: Diagnosis not present

## 2024-09-14 DIAGNOSIS — M6281 Muscle weakness (generalized): Secondary | ICD-10-CM

## 2024-09-14 DIAGNOSIS — R2689 Other abnormalities of gait and mobility: Secondary | ICD-10-CM

## 2024-09-14 NOTE — Therapy (Signed)
 OUTPATIENT PHYSICAL THERAPY LOWER EXTREMITY TREATMENT  Patient Name: Reginald [name] MRN: [id number] DOB:1965/12/12, 58 y.o., male Today's Date: 09/14/2024    END OF SESSION:  PT End of Session - 09/14/24 1542     Visit Number 17    Number of Visits 32    Date for Recertification  10/28/24    Authorization Type [employer]    Progress Note Due on Visit 20    PT Start Time 1445    PT Stop Time 1530    PT Time Calculation (min) 45 min    Activity Tolerance Patient tolerated treatment well    Behavior During Therapy [facility] for tasks assessed/performed            Past Medical History:  Diagnosis Date   Arthritis    Gallstones    Hypertension    Past Surgical History:  Procedure Laterality Date   CHOLECYSTECTOMY  12/03/2011   Procedure: LAPAROSCOPIC CHOLECYSTECTOMY WITH INTRAOPERATIVE CHOLANGIOGRAM;  Surgeon: Camellia CHRISTELLA Blush, MD;  Location: [facility] OR;  Service: General;  Laterality: N/A;  laparoscopic cholecystectomy with intraoperative cholangiogram   CHONDROPLASTY Right 09/19/2015   Procedure: CHONDROPLASTY;  Surgeon: Norleen Gavel, MD;  Location: [facility];  Service: Orthopedics;  Laterality: Right;   ENDOVENOUS ABLATION SAPHENOUS VEIN W/ LASER Right 01/13/2019   endovenous laser ablation right greater saphenous vein and stab phlebectomy 10-20 incisions right leg by Lonni Blade MD    IR ABLATE LIVER CRYOABLATION  04/03/2020   IR RADIOLOGIST EVAL & MGMT  03/28/2020   KNEE ARTHROSCOPY WITH LATERAL MENISECTOMY Right 09/19/2015   Procedure: KNEE ARTHROSCOPY WITH PARTIAL LATERAL MENISECTOMY;  Surgeon: Norleen Gavel, MD;  Location: [facility];  Service: Orthopedics;  Laterality: Right;   KNEE ARTHROSCOPY WITH MEDIAL MENISECTOMY Right 09/19/2015   Procedure: KNEE ARTHROSCOPY WITH PARTIAL MEDIAL MENISECTOMY;  Surgeon: Norleen Gavel, MD;  Location: [facility];  Service: Orthopedics;  Laterality: Right;   ROTATOR CUFF REPAIR Left     with revision   spider bite     black widow or brown recluse   TOE FUSION Left    TOTAL HIP ARTHROPLASTY Right 06/15/2023   Procedure: TOTAL HIP ARTHROPLASTY ANTERIOR APPROACH;  Surgeon: Gavel Norleen, MD;  Location: [facility];  Service: Orthopedics;  Laterality: Right;   TOTAL KNEE ARTHROPLASTY Right 03/31/2018   Procedure: RIGHT TOTAL KNEE ARTHROPLASTY;  Surgeon: Gavel Norleen, MD;  Location: [facility];  Service: Orthopedics;  Laterality: Right;   TOTAL KNEE REVISION Right 11/04/2019   Procedure: RIGHT TOTAL KNEE REVISION;  Surgeon: Gavel Norleen, MD;  Location: [facility];  Service: Orthopedics;  Laterality: Right;   TRIGGER FINGER RELEASE Right    Patient Active Problem List   Diagnosis Date Noted   Primary osteoarthritis of right hip 06/14/2023   Meralgia paraesthetica, right 06/10/2023   Spondylosis without myelopathy or radiculopathy, lumbar region 06/10/2023   Painful total knee replacement, right 11/04/2019   Arthrofibrosis of knee joint, right 11/04/2019   S/P revision of total knee, right 11/04/2019   Primary osteoarthritis of right knee 03/31/2018   Hypertension 12/02/2011   Obesity (BMI 30-39.9) 12/02/2011    PCP: [facility]  REFERRING PROVIDER: Gavel Norleen, MD  REFERRING DIAG: S/P right hip trochanteric bursectomy and IT band release  THERAPY DIAG:  Other symptoms and signs involving the musculoskeletal system  Pain in right hip  Other abnormalities of gait and mobility  Muscle weakness (generalized)  Rationale for Evaluation and Treatment: Rehabilitation  ONSET DATE:  3 weeks ago  SUBJECTIVE:   SUBJECTIVE STATEMENT: Pt states pain a little higher today 6/10 maybe because of the rain, past 2 days hip has been burning not sleeping well. Don't know what I will be able to tolerate today  PERTINENT HISTORY: Hx R THA, R TKA, hx LBP, HTN  PAIN:  Are you having pain? Yes: NPRS scale: 5/10 Pain location: R hip, Rt groin and Rt distal lateral  knee Pain description: aching, sharp Aggravating factors: ROM,  Relieving factors: rest  PRECAUTIONS: None  WEIGHT BEARING RESTRICTIONS: No  FALLS:  Has patient fallen in last 6 months? No  PLOF: Independent  PATIENT GOALS:  a little bit of relief  OBJECTIVE: (objective measures from initial evaluation unless otherwise dated)  MRI 8/25 right hip  IMPRESSION: 1. Right total hip arthroplasty with susceptibility artifact partially obscuring the adjacent soft tissue and osseous structures. No periarticular fluid collection or osteolysis. 2. Large amount of fluid in the right greater trochanteric bursa consistent with bursitis. 3. Mild osteoarthritis of the left hip.  PATIENT SURVEYS:  LEFS  Extreme difficulty/unable (0), Quite a bit of difficulty (1), Moderate difficulty (2), Little difficulty (3), No difficulty (4) Survey date:  07/07/24 09/02/24  Any of your usual work, housework or school activities 1 1  2. Usual hobbies, recreational or sporting activities 1 2  3. Getting into/out of the bath 1 1  4. Walking between rooms 2 2  5. Putting on socks/shoes 1 1  6. Squatting  2 1  7. Lifting an object, like a bag of groceries from the floor 2 2  8. Performing light activities around your home 2 2  9. Performing heavy activities around your home 0 0  10. Getting into/out of a car 2 1  11. Walking 2 blocks 0 1  12. Walking 1 mile 0 0  13. Going up/down 10 stairs (1 flight) 1 1  14. Standing for 1 hour 1 1  15.  sitting for 1 hour 2 2  16. Running on even ground 0 0  17. Running on uneven ground 0 0  18. Making sharp turns while running fast 0 0  19. Hopping  0 0  20. Rolling over in bed 1 1  Score total:  19/80 19/80     COGNITION: Overall cognitive status: Within functional limits for tasks assessed     SENSATION: WFL  POSTURE: No Significant postural limitations  PALPATION: TTP R greater Troch   LOWER EXTREMITY ROM:  Active ROM Right eval Left eval   Hip flexion    Hip extension    Hip abduction    Hip adduction    Hip internal rotation    Hip external rotation    Knee flexion    Knee extension    Ankle dorsiflexion    Ankle plantarflexion    Ankle inversion    Ankle eversion     (Blank rows = not tested) *= pain/symptoms  LOWER EXTREMITY MMT:  MMT Right eval Left eval Right 09/02/24 Left 09/02/24  Hip flexion 4+ 5 4+ 5  Hip extension 4 4+ 4+ 5  Hip abduction (sidelying) 4-  4+ 5  Hip adduction      Hip internal rotation      Hip external rotation      Knee flexion 5 5 5 5   Knee extension 5 5 5 5   Ankle dorsiflexion      Ankle plantarflexion      Ankle inversion  Ankle eversion       (Blank rows = not tested) *= pain/symptoms  FUNCTIONAL TESTS:  5 times sit to stand: 26.97 seconds  Stairs: 7 inch, step too pattern, uses LLE 5xSTS 09/02/24: 17.38 without use of UEs  GAIT: Distance walked: 100 feet  Assistive device utilized: None Level of assistance: Complete Independence Comments: antalgic on RLE with truncal lean to R   TODAY'S TREATMENT:       09/14/24 Pt seen for aquatic therapy today.  Treatment took place in water  3.5-4.75 ft in depth at the [facility] pool. Temp of water  was 91.  Pt entered/exited the pool via stairs independently in step-to pattern with bil rail.  - unsupported walking forward/ backward/side stepping prior to session starting (backward increases hip burning. -TrA set using big yellow noodle wide stance then staggered x 10 -standing balance on thick yellow noodle :wide stance rolling heel/to->slow march -> squats 2 x 5 -thick yellow noodle stomp LE  hip in neutral, and into hip ER x 12 slow each position -Green hand bell resisted arm swing wide stance then staggered 3 sets ea position 7 slow/7 fast  - rest period -vertical squats x 10-> x10 alternating driving vertically SL R/L -stool scoots using black noodle rocking forward/back bilaterally then SL->backward then  forward 2 laps ea - cycling on thick white noodle  09/07/24 NuStep 5 min lvl 5 Supine SKTC x30 seconds Piriformis stretch x30 seconds with rocking  SL hip hinge with ball pushed into abduction Seated hamstring stretch x30 seconds  Leg press 3x10 150#         SL 2x10 100# Sit to stand with 15# KB Hand dyno test: 58lbs bilat in abduction Rolling to glutes and lateralis                                                                                                                  09/02/24 NuStep 5 min lvl 4  Reassessment  IASTM and STM to left ITB, piriformis, glute med, and biceps femoris  Seated hamstring stretch x30 seconds  Leg press 3x10 145#  09/01/24 Pt seen for aquatic therapy today.  Treatment took place in water  3.5-4.75 ft in depth at the [facility] pool. Temp of water  was 91.  Pt entered/exited the pool via stairs independently in step-to pattern with bil rail.  - unsupported walking forward/ backward/side stepping prior to session starting - backward walking lunges (some pain in Rt knee) - vertical squats -> side stepping into vertical squat R/L -plank at bench:  hip/knee flexion to hip/knee ext x 15; fire hydrants x 10 each LE -thick yellow noodle stomp LE  hip in neutral, and into hip abdct, 2 sets of 10 reps (1 fast, 1 slow, each LE) -standing balance on thick yellow noodle :wide stance rolling heel/to->slow march -> squats x 10 - runners step ups x 10 each LE. -seated on 3rd step flutter kicking/hip flex/ext; hip add/abd,  3 sets 10 slow/20 fast. - STS from 3rd step with 4  sec decent each rep x 12 - cycling on thick white noodle  08/27/24 NuStep x 6 min  Manual: IASTM with Massage gun to R ITB and quads  LF leg extension 45# 3x10 LF leg press 110# x10, 140# 2x10 RDLs with 3# dowel 2x10 Active hamstring stretch 2x10 with 5 second hold   08/24/24 Pt seen for aquatic therapy today.  Treatment took place in water  3.5-4.75 ft in depth at the [facility] pool. Temp of water  was 91.  Pt entered/exited the pool via stairs independently in step-to pattern with bil rail.  - unsupported walking forward/ backward/side stepping  -solid black noodle stomp rle  hip in neutral, then externally rotated 2 sets of 10 reps slow/10 fast opposite ue support on wall -standing balance on black noodle :wide stance rolling heel/to->balancing directly on noodle->squats x 7 -FT in position above standing balance challenge on noodle holding x 10s - step ups leading R/L x 10 unsupported -2nd step toe tap x 10 alternating - runners step ups x 10.  Good SLS balance -seated on 3rd step flutter kicking/hip flex/ext; hip add/abd 3 sets 10 slow/20 fast. - STS from 3rd step with cues for immediate standing balance then decent with hip hinge. - side lunge with yellow HB ue add/abd (adductor stretch)  08/19/24 Nu step x36min L5  Standing hip abduction/extension 2x10ea/bil PROM R hip 1x10 clamshell with blue band  3x10 bridges   3x10 leg press 140lbs 3x30 seconds prone quad stretch with strap 2x10 partial lunges  2x10 squats   08/16/24 Pt seen for aquatic therapy today.  Treatment took place in water  3.5-4.75 ft in depth at the [facility] pool. Temp of water  was 91.  Pt entered/exited the pool via stairs independently in step-to pattern with bil rail.  - unsupported walking forward/ backward/side stepping  - step ups leading R/L x 10 unsupported-> runners step ups x 10.  VC and demonstration for execution - Forward split squats 2 widths yellow HB  - UE on yellow hand floats side lunge x 4 widths. Wide squat increased stretch -figure 4 stretch at quad stretch at steps -solid noodle stomp rle  hip in neutral 2 x 10 then externally rotated x10 -seated on 3rd step flutter kicking/hip flex/ext; hip add/abd 3 sets 20 slow/20 fast.   08/04/24 Manual Therapy: STM to right IT band, iliopsoas, and quad   Therapeutic Exercise:  1x10 clamshell with blue  band  2x10 bridges   3x10 leg press 70# for 10 reps, 100# for 10 reps  3x30 seconds prone quad stretch with strap  Therapeutic Activity:  2x10 partial lunges  2x10 squats   08/02/24 Pt seen for aquatic therapy today.  Treatment took place in water  3.5-4.75 ft in depth at the [facility] pool. Temp of water  was 91.  Pt entered/exited the pool via stairs independently in step-to pattern with bil rail.  Manual therapy prior to entering pool: MFR to Rt iliopsoas  - unsupported walking forward/ backward/side stepping with long strides for hip flexor stretch - UE on wall: split squats x 10 each LE; single leg clams x 10 each;   - UE on yellow hand floats side lunge x 2 widths - plank on bench: hip extension alternating x 10; mountain climbers 2 x 5 - prone suspension using noodle : hip flex/ext; hip add/abd  Date:  07/26/2024 -PROM R hip -STM to proximal ihip flexors, HS -STM/roller to lateral HS  - Bridge 2x10 - S/l clam 2x10 - Side  stepping with BLKTB around ankles x5 laps at rail - Squats 2x15 -partial lunges x10ea - Modified thomas stretch with focus on hip flexors  - Prone HS curls x20 Standing hip extension 2x20ea    PATIENT EDUCATION:  Education details: exercise progression/ modification Person educated: Patient Education method: Programmer, Multimedia, Demonstration,  Education comprehension: verbalized understanding, returned demonstration, verbal cues required, and tactile cues required  HOME EXERCISE PROGRAM: Access Code: [email or web address] URL: [email or web address] Date: 07/07/2024 Prepared by: Prentice Zaunegger  Exercises - Supine Bridge  - 1 x daily - 7 x weekly - 3 sets - 10 reps - Clamshell (Mirrored)  - 1 x daily - 7 x weekly - 3 sets - 10 reps  ASSESSMENT:  CLINICAL IMPRESSION: Pt with slight increase in right glute/hip pain for past few days.  States burning sensation with all movement. Session focused today mostly with isometric glute and core  engagement in attempts to limit/decrease burning. He has good toleration. Was successful is reducing pain/burning some. Added a cardio component which he enjoyed. Surgery scheduled for Dec 8. Pt will continue to benefit from skilled PT to address continued deficits.     Eval: Patient a 58 y.o. y.o. male who was seen today for physical therapy evaluation and treatment for S/P right hip trochanteric bursectomy and IT band release. Patient presents with pain limited deficits in R hip strength, ROM, endurance, activity tolerance, and functional mobility with ADL. Patient is having to modify and restrict ADL as indicated by outcome measure score as well as subjective information and objective measures which is affecting overall participation. Patient will benefit from skilled physical therapy in order to improve function and reduce impairment.  OBJECTIVE IMPAIRMENTS: Abnormal gait, decreased activity tolerance, decreased balance, decreased endurance, decreased mobility, difficulty walking, decreased ROM, decreased strength, increased muscle spasms, impaired flexibility, improper body mechanics, and pain  ACTIVITY LIMITATIONS: lifting, bending, standing, squatting, stairs, transfers, locomotion level, and caring for others  PARTICIPATION LIMITATIONS: meal prep, cleaning, laundry, shopping, community activity, occupation, and yard work  PERSONAL FACTORS: Fitness and 3+ comorbidities: Hx R THA, R TKA, hx LBP, HTN are also affecting patient's functional outcome.   REHAB POTENTIAL: Good  CLINICAL DECISION MAKING: Evolving/moderate complexity  EVALUATION COMPLEXITY: Moderate   GOALS: Goals reviewed with patient? Yes  SHORT TERM GOALS: Target date: 08/04/2024    Patient will be independent with HEP in order to improve functional outcomes. Baseline: Goal status: Met 08/03/23  2.  Patient will report at least 25% improvement in symptoms/functional status for improved quality of life. Baseline: Goal  status: Met 09/02/24   LONG TERM GOALS: Target date: 09/01/2024  Patient will report at least 75% improvement in symptoms for improved quality of life. Baseline:  Goal status: Progressing 09/02/24 (40%)  2.  Patient will improve LEFS  score by at least 15 points in order to indicate improved tolerance to activity. Baseline:  Goal status: Progressing 09/02/24  3.  Patient will be able to navigate stairs with reciprocal pattern without compensation in order to demonstrate improved LE strength. Baseline:  Goal status: Progressing 09/02/24  4. Patient will demonstrate grade of 5/5 MMT grade in all tested musculature as evidence of improved strength to assist with stair ambulation and gait. Baseline:  Goal status: Progressing 09/02/24  5.  Patient will be able to complete 5x STS in under 15 seconds in order to reduce the risk of falls. Baseline:  Goal status: Progressing 09/02/24  PLAN:  PT FREQUENCY: 2x/week  PT DURATION: 8 weeks  PLANNED INTERVENTIONS:  02835- PT Re-evaluation, 97110-Therapeutic exercises, 97530- Therapeutic activity, [id number]- Neuromuscular re-education, [phone]- Self Care, 02859- Manual therapy, [phone]- Gait training, [phone]- Orthotic Fit/training, [phone]- Canalith repositioning, [id number]- Aquatic Therapy, 97760- Splinting, 97597- Wound care (first 20 sq cm), 97598- Wound care (each additional 20 sq cm)Patient/Family education, Balance training, Stair training, Taping, Dry Needling, Joint mobilization, Joint manipulation, Spinal manipulation, Spinal mobilization, Scar mobilization, and DME instructions.  PLAN FOR NEXT SESSION: hip strength, functional strength   Ronal Foots) Crysten Kaman MPT 09/14/24 3:43 PM [facility] [address] [place], [place], [address] Phone: [phone]   Fax:  [phone]

## 2024-09-19 ENCOUNTER — Encounter: Payer: Self-pay | Admitting: Radiology

## 2024-09-20 ENCOUNTER — Ambulatory Visit (HOSPITAL_BASED_OUTPATIENT_CLINIC_OR_DEPARTMENT_OTHER): Admitting: Physical Therapy

## 2024-09-27 ENCOUNTER — Encounter (HOSPITAL_BASED_OUTPATIENT_CLINIC_OR_DEPARTMENT_OTHER): Payer: Self-pay | Admitting: Physical Therapy

## 2024-09-27 ENCOUNTER — Ambulatory Visit (HOSPITAL_BASED_OUTPATIENT_CLINIC_OR_DEPARTMENT_OTHER): Attending: Orthopedic Surgery | Admitting: Physical Therapy

## 2024-09-27 DIAGNOSIS — R29898 Other symptoms and signs involving the musculoskeletal system: Secondary | ICD-10-CM | POA: Diagnosis not present

## 2024-09-27 DIAGNOSIS — R2689 Other abnormalities of gait and mobility: Secondary | ICD-10-CM | POA: Diagnosis not present

## 2024-09-27 DIAGNOSIS — M25551 Pain in right hip: Secondary | ICD-10-CM | POA: Insufficient documentation

## 2024-09-27 DIAGNOSIS — M6281 Muscle weakness (generalized): Secondary | ICD-10-CM | POA: Diagnosis not present

## 2024-09-27 NOTE — Therapy (Signed)
 OUTPATIENT PHYSICAL THERAPY LOWER EXTREMITY TREATMENT  Patient Name: Reginald [name] MRN: [id number] DOB:12-Sep-1966, 58 y.o., male Today's Date: 09/27/2024    END OF SESSION:  PT End of Session - 09/27/24 0935     Visit Number 18    Number of Visits 32    Date for Recertification  10/28/24    Authorization Type [employer]    Progress Note Due on Visit 20    PT Start Time 0800    PT Stop Time 0845    PT Time Calculation (min) 45 min    Activity Tolerance Patient tolerated treatment well    Behavior During Therapy [facility] for tasks assessed/performed             Past Medical History:  Diagnosis Date   Arthritis    Gallstones    Hypertension    Past Surgical History:  Procedure Laterality Date   CHOLECYSTECTOMY  12/03/2011   Procedure: LAPAROSCOPIC CHOLECYSTECTOMY WITH INTRAOPERATIVE CHOLANGIOGRAM;  Surgeon: Camellia CHRISTELLA Blush, MD;  Location: [facility];  Service: General;  Laterality: N/A;  laparoscopic cholecystectomy with intraoperative cholangiogram   CHONDROPLASTY Right 09/19/2015   Procedure: CHONDROPLASTY;  Surgeon: Norleen Gavel, MD;  Location: [facility];  Service: Orthopedics;  Laterality: Right;   ENDOVENOUS ABLATION SAPHENOUS VEIN W/ LASER Right 01/13/2019   endovenous laser ablation right greater saphenous vein and stab phlebectomy 10-20 incisions right leg by Lonni Blade MD    IR ABLATE LIVER CRYOABLATION  04/03/2020   IR RADIOLOGIST EVAL & MGMT  03/28/2020   KNEE ARTHROSCOPY WITH LATERAL MENISECTOMY Right 09/19/2015   Procedure: KNEE ARTHROSCOPY WITH PARTIAL LATERAL MENISECTOMY;  Surgeon: Norleen Gavel, MD;  Location: [facility];  Service: Orthopedics;  Laterality: Right;   KNEE ARTHROSCOPY WITH MEDIAL MENISECTOMY Right 09/19/2015   Procedure: KNEE ARTHROSCOPY WITH PARTIAL MEDIAL MENISECTOMY;  Surgeon: Norleen Gavel, MD;  Location: [facility];  Service: Orthopedics;  Laterality: Right;   ROTATOR CUFF REPAIR Left     with revision   spider bite     black widow or brown recluse   TOE FUSION Left    TOTAL HIP ARTHROPLASTY Right 06/15/2023   Procedure: TOTAL HIP ARTHROPLASTY ANTERIOR APPROACH;  Surgeon: Gavel Norleen, MD;  Location: [facility];  Service: Orthopedics;  Laterality: Right;   TOTAL KNEE ARTHROPLASTY Right 03/31/2018   Procedure: RIGHT TOTAL KNEE ARTHROPLASTY;  Surgeon: Gavel Norleen, MD;  Location: [facility];  Service: Orthopedics;  Laterality: Right;   TOTAL KNEE REVISION Right 11/04/2019   Procedure: RIGHT TOTAL KNEE REVISION;  Surgeon: Gavel Norleen, MD;  Location: [facility];  Service: Orthopedics;  Laterality: Right;   TRIGGER FINGER RELEASE Right    Patient Active Problem List   Diagnosis Date Noted   Primary osteoarthritis of right hip 06/14/2023   Meralgia paraesthetica, right 06/10/2023   Spondylosis without myelopathy or radiculopathy, lumbar region 06/10/2023   Painful total knee replacement, right 11/04/2019   Arthrofibrosis of knee joint, right 11/04/2019   S/P revision of total knee, right 11/04/2019   Primary osteoarthritis of right knee 03/31/2018   Hypertension 12/02/2011   Obesity (BMI 30-39.9) 12/02/2011    PCP: [facility]  REFERRING PROVIDER: Gavel Norleen, MD  REFERRING DIAG: S/P right hip trochanteric bursectomy and IT band release  THERAPY DIAG:  Other symptoms and signs involving the musculoskeletal system  Pain in right hip  Other abnormalities of gait and mobility  Muscle weakness (generalized)  Rationale for Evaluation and Treatment: Rehabilitation  ONSET  DATE: 3 weeks ago  SUBJECTIVE:   SUBJECTIVE STATEMENT: Pt states pain 5/10, reduced burning since last session.  PERTINENT HISTORY: Hx R THA, R TKA, hx LBP, HTN  PAIN:  Are you having pain? Yes: NPRS scale: 5/10 Pain location: R hip, Rt groin and Rt distal lateral knee Pain description: aching, sharp Aggravating factors: ROM,  Relieving factors: rest  PRECAUTIONS:  None  WEIGHT BEARING RESTRICTIONS: No  FALLS:  Has patient fallen in last 6 months? No  PLOF: Independent  PATIENT GOALS:  a little bit of relief  OBJECTIVE: (objective measures from initial evaluation unless otherwise dated)  MRI 8/25 right hip  IMPRESSION: 1. Right total hip arthroplasty with susceptibility artifact partially obscuring the adjacent soft tissue and osseous structures. No periarticular fluid collection or osteolysis. 2. Large amount of fluid in the right greater trochanteric bursa consistent with bursitis. 3. Mild osteoarthritis of the left hip.  PATIENT SURVEYS:  LEFS  Extreme difficulty/unable (0), Quite a bit of difficulty (1), Moderate difficulty (2), Little difficulty (3), No difficulty (4) Survey date:  07/07/24 09/02/24  Any of your usual work, housework or school activities 1 1  2. Usual hobbies, recreational or sporting activities 1 2  3. Getting into/out of the bath 1 1  4. Walking between rooms 2 2  5. Putting on socks/shoes 1 1  6. Squatting  2 1  7. Lifting an object, like a bag of groceries from the floor 2 2  8. Performing light activities around your home 2 2  9. Performing heavy activities around your home 0 0  10. Getting into/out of a car 2 1  11. Walking 2 blocks 0 1  12. Walking 1 mile 0 0  13. Going up/down 10 stairs (1 flight) 1 1  14. Standing for 1 hour 1 1  15.  sitting for 1 hour 2 2  16. Running on even ground 0 0  17. Running on uneven ground 0 0  18. Making sharp turns while running fast 0 0  19. Hopping  0 0  20. Rolling over in bed 1 1  Score total:  19/80 19/80     COGNITION: Overall cognitive status: Within functional limits for tasks assessed     SENSATION: WFL  POSTURE: No Significant postural limitations  PALPATION: TTP R greater Troch   LOWER EXTREMITY ROM:  Active ROM Right eval Left eval  Hip flexion    Hip extension    Hip abduction    Hip adduction    Hip internal rotation    Hip external  rotation    Knee flexion    Knee extension    Ankle dorsiflexion    Ankle plantarflexion    Ankle inversion    Ankle eversion     (Blank rows = not tested) *= pain/symptoms  LOWER EXTREMITY MMT:  MMT Right eval Left eval Right 09/02/24 Left 09/02/24  Hip flexion 4+ 5 4+ 5  Hip extension 4 4+ 4+ 5  Hip abduction (sidelying) 4-  4+ 5  Hip adduction      Hip internal rotation      Hip external rotation      Knee flexion 5 5 5 5   Knee extension 5 5 5 5   Ankle dorsiflexion      Ankle plantarflexion      Ankle inversion      Ankle eversion       (Blank rows = not tested) *= pain/symptoms  FUNCTIONAL TESTS:  5 times sit  to stand: 26.97 seconds  Stairs: 7 inch, step too pattern, uses LLE 5xSTS 09/02/24: 17.38 without use of UEs  GAIT: Distance walked: 100 feet  Assistive device utilized: None Level of assistance: Complete Independence Comments: antalgic on RLE with truncal lean to R   TODAY'S TREATMENT:       09/27/24 Pt seen for aquatic therapy today.  Treatment took place in water  3.5-4.75 ft in depth at the [facility] pool. Temp of water  was 91.  Pt entered/exited the pool via stairs independently in step-to pattern with bil rail.   Exercise  - Stool Scoots  - Noodle press - Noodle Stomp   - Standing Balance on Noodle at [employer]   - Squat on Noodle  - Plank on Long Hand Float with Leg Lift   - Flutter Kicking/Windshield Wipers   - Runner's Step Up/Down      09/14/24 Pt seen for aquatic therapy today.  Treatment took place in water  3.5-4.75 ft in depth at the [facility] pool. Temp of water  was 91.  Pt entered/exited the pool via stairs independently in step-to pattern with bil rail.  - unsupported walking forward/ backward/side stepping prior to session starting (backward increases hip burning. -TrA set using big yellow noodle wide stance then staggered x 10 -standing balance on thick yellow noodle :wide stance rolling heel/to->slow  march -> squats 2 x 5 -thick yellow noodle stomp LE  hip in neutral, and into hip ER x 12 slow each position -Green hand bell resisted arm swing wide stance then staggered 3 sets ea position 7 slow/7 fast  - rest period -vertical squats x 10-> x10 alternating driving vertically SL R/L -stool scoots using black noodle rocking forward/back bilaterally then SL->backward then forward 2 laps ea - cycling on thick white noodle  09/07/24 NuStep 5 min lvl 5 Supine SKTC x30 seconds Piriformis stretch x30 seconds with rocking  SL hip hinge with ball pushed into abduction Seated hamstring stretch x30 seconds  Leg press 3x10 150#         SL 2x10 100# Sit to stand with 15# KB Hand dyno test: 58lbs bilat in abduction Rolling to glutes and lateralis                                                                                                                  09/02/24 NuStep 5 min lvl 4  Reassessment  IASTM and STM to left ITB, piriformis, glute med, and biceps femoris  Seated hamstring stretch x30 seconds  Leg press 3x10 145#  09/01/24 Pt seen for aquatic therapy today.  Treatment took place in water  3.5-4.75 ft in depth at the [facility] pool. Temp of water  was 91.  Pt entered/exited the pool via stairs independently in step-to pattern with bil rail.  - unsupported walking forward/ backward/side stepping prior to session starting - backward walking lunges (some pain in Rt knee) - vertical squats -> side stepping into vertical squat R/L -plank at bench:  hip/knee flexion to hip/knee ext x  15; fire hydrants x 10 each LE -thick yellow noodle stomp LE  hip in neutral, and into hip abdct, 2 sets of 10 reps (1 fast, 1 slow, each LE) -standing balance on thick yellow noodle :wide stance rolling heel/to->slow march -> squats x 10 - runners step ups x 10 each LE. -seated on 3rd step flutter kicking/hip flex/ext; hip add/abd,  3 sets 10 slow/20 fast. - STS from 3rd step with 4 sec decent  each rep x 12 - cycling on thick white noodle  08/27/24 NuStep x 6 min  Manual: IASTM with Massage gun to R ITB and quads  LF leg extension 45# 3x10 LF leg press 110# x10, 140# 2x10 RDLs with 3# dowel 2x10 Active hamstring stretch 2x10 with 5 second hold   08/24/24 Pt seen for aquatic therapy today.  Treatment took place in water  3.5-4.75 ft in depth at the [facility] pool. Temp of water  was 91.  Pt entered/exited the pool via stairs independently in step-to pattern with bil rail.  - unsupported walking forward/ backward/side stepping  -solid black noodle stomp rle  hip in neutral, then externally rotated 2 sets of 10 reps slow/10 fast opposite ue support on wall -standing balance on black noodle :wide stance rolling heel/to->balancing directly on noodle->squats x 7 -FT in position above standing balance challenge on noodle holding x 10s - step ups leading R/L x 10 unsupported -2nd step toe tap x 10 alternating - runners step ups x 10.  Good SLS balance -seated on 3rd step flutter kicking/hip flex/ext; hip add/abd 3 sets 10 slow/20 fast. - STS from 3rd step with cues for immediate standing balance then decent with hip hinge. - side lunge with yellow HB ue add/abd (adductor stretch)  08/19/24 Nu step x66min L5  Standing hip abduction/extension 2x10ea/bil PROM R hip 1x10 clamshell with blue band  3x10 bridges   3x10 leg press 140lbs 3x30 seconds prone quad stretch with strap 2x10 partial lunges  2x10 squats   08/16/24 Pt seen for aquatic therapy today.  Treatment took place in water  3.5-4.75 ft in depth at the [facility] pool. Temp of water  was 91.  Pt entered/exited the pool via stairs independently in step-to pattern with bil rail.  - unsupported walking forward/ backward/side stepping  - step ups leading R/L x 10 unsupported-> runners step ups x 10.  VC and demonstration for execution - Forward split squats 2 widths yellow HB  - UE on yellow hand floats  side lunge x 4 widths. Wide squat increased stretch -figure 4 stretch at quad stretch at steps -solid noodle stomp rle  hip in neutral 2 x 10 then externally rotated x10 -seated on 3rd step flutter kicking/hip flex/ext; hip add/abd 3 sets 20 slow/20 fast.   08/04/24 Manual Therapy: STM to right IT band, iliopsoas, and quad   Therapeutic Exercise:  1x10 clamshell with blue band  2x10 bridges   3x10 leg press 70# for 10 reps, 100# for 10 reps  3x30 seconds prone quad stretch with strap  Therapeutic Activity:  2x10 partial lunges  2x10 squats   08/02/24 Pt seen for aquatic therapy today.  Treatment took place in water  3.5-4.75 ft in depth at the [facility] pool. Temp of water  was 91.  Pt entered/exited the pool via stairs independently in step-to pattern with bil rail.  Manual therapy prior to entering pool: MFR to Rt iliopsoas  - unsupported walking forward/ backward/side stepping with long strides for hip flexor stretch - UE on wall:  split squats x 10 each LE; single leg clams x 10 each;   - UE on yellow hand floats side lunge x 2 widths - plank on bench: hip extension alternating x 10; mountain climbers 2 x 5 - prone suspension using noodle : hip flex/ext; hip add/abd  Date:  07/26/2024 -PROM R hip -STM to proximal ihip flexors, HS -STM/roller to lateral HS  - Bridge 2x10 - S/l clam 2x10 - Side stepping with BLKTB around ankles x5 laps at rail - Squats 2x15 -partial lunges x10ea - Modified thomas stretch with focus on hip flexors  - Prone HS curls x20 Standing hip extension 2x20ea    PATIENT EDUCATION:  Education details: exercise progression/ modification Person educated: Patient Education method: Programmer, Multimedia, Demonstration,  Education comprehension: verbalized understanding, returned demonstration, verbal cues required, and tactile cues required  HOME EXERCISE PROGRAM: Access Code: [email or web address] URL: [email or web address] Date:  07/07/2024 Prepared by: Prentice Zaunegger  Exercises - Supine Bridge  - 1 x daily - 7 x weekly - 3 sets - 10 reps - Clamshell (Mirrored)  - 1 x daily - 7 x weekly - 3 sets - 10 reps  Access Code: [email or web address] URL: [email or web address] Date: 09/27/2024 Prepared by: Frankie Lyndsey Demos  Exercises - Stool Scoots  - 1 x daily - 1-3 x weekly - Noodle press  - 1 x daily - 1-3 x weekly - 1-3 sets - 10 reps - Noodle Stomp  - 1 x daily - 1-3 x weekly - 1-2 sets - 10 reps - Standing Balance on Noodle at [employer]  - 1 x daily - 1-3 x weekly - 1-3 reps - 20 hold - Squat on Noodle  - 1 x daily - 1-3 x weekly - 1-3 sets - 10 reps - Plank on Long Hand Float with Leg Lift  - 1 x daily - 1-3 x weekly - 1-2 sets - 10 reps - Flutter Kicking/Windshield Wipers  - 1 x daily - 1-3 x weekly - 3 sets - 10 reps - Runner's Step Up/Down  - 1 x daily - 1-3 x weekly - 1-3 sets - 10 reps - arm swing resisted with hand bells  - 1 x daily - 1-3 x weekly - 3 sets - 5-10 reps ASSESSMENT:  CLINICAL IMPRESSION: PT Schedule adjusted going forward with surgical date of 12/8. He has been compliant with pool exercises coming 2-3 a week above his aquatic PT sessions and land based intervention. Pt instructed through aquatic HEP as it is created today.  He completes with VC for execution as well purpose and muscle/movement to focus. No increase in pain good toleration.  Plan to see in aquatics for 1 more session to finalize and issued aquatic hep. Goals ongoing      Eval: Patient a 58 y.o. y.o. male who was seen today for physical therapy evaluation and treatment for S/P right hip trochanteric bursectomy and IT band release. Patient presents with pain limited deficits in R hip strength, ROM, endurance, activity tolerance, and functional mobility with ADL. Patient is having to modify and restrict ADL as indicated by outcome measure score as well as subjective information and objective measures which is affecting overall  participation. Patient will benefit from skilled physical therapy in order to improve function and reduce impairment.  OBJECTIVE IMPAIRMENTS: Abnormal gait, decreased activity tolerance, decreased balance, decreased endurance, decreased mobility, difficulty walking, decreased ROM, decreased strength, increased muscle spasms, impaired flexibility, improper body mechanics, and pain  ACTIVITY LIMITATIONS: lifting, bending, standing, squatting, stairs, transfers, locomotion  level, and caring for others  PARTICIPATION LIMITATIONS: meal prep, cleaning, laundry, shopping, community activity, occupation, and yard work  PERSONAL FACTORS: Fitness and 3+ comorbidities: Hx R THA, R TKA, hx LBP, HTN are also affecting patient's functional outcome.   REHAB POTENTIAL: Good  CLINICAL DECISION MAKING: Evolving/moderate complexity  EVALUATION COMPLEXITY: Moderate   GOALS: Goals reviewed with patient? Yes  SHORT TERM GOALS: Target date: 08/04/2024    Patient will be independent with HEP in order to improve functional outcomes. Baseline: Goal status: Met 08/03/23  2.  Patient will report at least 25% improvement in symptoms/functional status for improved quality of life. Baseline: Goal status: Met 09/02/24   LONG TERM GOALS: Target date: 09/01/2024  Patient will report at least 75% improvement in symptoms for improved quality of life. Baseline:  Goal status: Progressing 09/02/24 (40%)  2.  Patient will improve LEFS  score by at least 15 points in order to indicate improved tolerance to activity. Baseline:  Goal status: Progressing 09/02/24  3.  Patient will be able to navigate stairs with reciprocal pattern without compensation in order to demonstrate improved LE strength. Baseline:  Goal status: Progressing 09/02/24  4. Patient will demonstrate grade of 5/5 MMT grade in all tested musculature as evidence of improved strength to assist with stair ambulation and gait. Baseline:  Goal status:  Progressing 09/02/24  5.  Patient will be able to complete 5x STS in under 15 seconds in order to reduce the risk of falls. Baseline:  Goal status: Progressing 09/02/24  PLAN:  PT FREQUENCY: 2x/week  PT DURATION: 8 weeks  PLANNED INTERVENTIONS: 97164- PT Re-evaluation, 97110-Therapeutic exercises, 97530- Therapeutic activity, 97112- Neuromuscular re-education, 97535- Self Care, [id number]- Manual therapy, [phone]- Gait training, [phone]- Orthotic Fit/training, [phone]- Canalith repositioning, [id number]- Aquatic Therapy, [phone]- Splinting, [phone]- Wound care (first 20 sq cm), 97598- Wound care (each additional 20 sq cm)Patient/Family education, Balance training, Stair training, Taping, Dry Needling, Joint mobilization, Joint manipulation, Spinal manipulation, Spinal mobilization, Scar mobilization, and DME instructions.  PLAN FOR NEXT SESSION: hip strength, functional strength   Ronal Foots) Latrell Potempa MPT 09/27/24 9:35 AM [facility] [address] [place], [place], [address] Phone: [phone]   Fax:  [phone]

## 2024-09-30 ENCOUNTER — Encounter (HOSPITAL_BASED_OUTPATIENT_CLINIC_OR_DEPARTMENT_OTHER): Payer: Self-pay | Admitting: Physical Therapy

## 2024-09-30 ENCOUNTER — Ambulatory Visit (HOSPITAL_BASED_OUTPATIENT_CLINIC_OR_DEPARTMENT_OTHER): Admitting: Physical Therapy

## 2024-09-30 DIAGNOSIS — R29898 Other symptoms and signs involving the musculoskeletal system: Secondary | ICD-10-CM

## 2024-09-30 DIAGNOSIS — R2689 Other abnormalities of gait and mobility: Secondary | ICD-10-CM

## 2024-09-30 DIAGNOSIS — M25551 Pain in right hip: Secondary | ICD-10-CM

## 2024-09-30 DIAGNOSIS — M6281 Muscle weakness (generalized): Secondary | ICD-10-CM

## 2024-09-30 NOTE — Therapy (Signed)
 OUTPATIENT PHYSICAL THERAPY LOWER EXTREMITY TREATMENT  Patient Name: Reginald [name] MRN: [id number] DOB:May 08, 1966, 58 y.o., male Today's Date: 09/30/2024    END OF SESSION:  PT End of Session - 09/30/24 0853     Visit Number 19    Number of Visits 32    Date for Recertification  10/28/24    Authorization Type [employer]    Progress Note Due on Visit 20    PT Start Time 0846    PT Stop Time 0926    PT Time Calculation (min) 40 min    Activity Tolerance Patient tolerated treatment well    Behavior During Therapy [facility] for tasks assessed/performed              Past Medical History:  Diagnosis Date   Arthritis    Gallstones    Hypertension    Past Surgical History:  Procedure Laterality Date   CHOLECYSTECTOMY  12/03/2011   Procedure: LAPAROSCOPIC CHOLECYSTECTOMY WITH INTRAOPERATIVE CHOLANGIOGRAM;  Surgeon: Camellia CHRISTELLA Blush, MD;  Location: [facility] OR;  Service: General;  Laterality: N/A;  laparoscopic cholecystectomy with intraoperative cholangiogram   CHONDROPLASTY Right 09/19/2015   Procedure: CHONDROPLASTY;  Surgeon: Norleen Gavel, MD;  Location: [facility];  Service: Orthopedics;  Laterality: Right;   ENDOVENOUS ABLATION SAPHENOUS VEIN W/ LASER Right 01/13/2019   endovenous laser ablation right greater saphenous vein and stab phlebectomy 10-20 incisions right leg by Lonni Blade MD    IR ABLATE LIVER CRYOABLATION  04/03/2020   IR RADIOLOGIST EVAL & MGMT  03/28/2020   KNEE ARTHROSCOPY WITH LATERAL MENISECTOMY Right 09/19/2015   Procedure: KNEE ARTHROSCOPY WITH PARTIAL LATERAL MENISECTOMY;  Surgeon: Norleen Gavel, MD;  Location: [facility];  Service: Orthopedics;  Laterality: Right;   KNEE ARTHROSCOPY WITH MEDIAL MENISECTOMY Right 09/19/2015   Procedure: KNEE ARTHROSCOPY WITH PARTIAL MEDIAL MENISECTOMY;  Surgeon: Norleen Gavel, MD;  Location: [facility];  Service: Orthopedics;  Laterality: Right;   ROTATOR CUFF REPAIR  Left    with revision   spider bite     black widow or brown recluse   TOE FUSION Left    TOTAL HIP ARTHROPLASTY Right 06/15/2023   Procedure: TOTAL HIP ARTHROPLASTY ANTERIOR APPROACH;  Surgeon: Gavel Norleen, MD;  Location: [facility];  Service: Orthopedics;  Laterality: Right;   TOTAL KNEE ARTHROPLASTY Right 03/31/2018   Procedure: RIGHT TOTAL KNEE ARTHROPLASTY;  Surgeon: Gavel Norleen, MD;  Location: [facility];  Service: Orthopedics;  Laterality: Right;   TOTAL KNEE REVISION Right 11/04/2019   Procedure: RIGHT TOTAL KNEE REVISION;  Surgeon: Gavel Norleen, MD;  Location: [facility];  Service: Orthopedics;  Laterality: Right;   TRIGGER FINGER RELEASE Right    Patient Active Problem List   Diagnosis Date Noted   Primary osteoarthritis of right hip 06/14/2023   Meralgia paraesthetica, right 06/10/2023   Spondylosis without myelopathy or radiculopathy, lumbar region 06/10/2023   Painful total knee replacement, right 11/04/2019   Arthrofibrosis of knee joint, right 11/04/2019   S/P revision of total knee, right 11/04/2019   Primary osteoarthritis of right knee 03/31/2018   Hypertension 12/02/2011   Obesity (BMI 30-39.9) 12/02/2011    PCP: [facility]  REFERRING PROVIDER: Gavel Norleen, MD  REFERRING DIAG: S/P right hip trochanteric bursectomy and IT band release  THERAPY DIAG:  Other symptoms and signs involving the musculoskeletal system  Pain in right hip  Other abnormalities of gait and mobility  Muscle weakness (generalized)  Rationale for Evaluation and Treatment: Rehabilitation  ONSET DATE: 3 weeks ago  SUBJECTIVE:   SUBJECTIVE STATEMENT:  Nothing new, strengthening and stretching has been helpful   PERTINENT HISTORY: Hx R THA, R TKA, hx LBP, HTN  PAIN:  Are you having pain? Yes: NPRS scale: 5/10 Pain location: R hip, Rt groin and Rt distal lateral knee Pain description: aching, sharp Aggravating factors: ROM,  Relieving factors: rest  PRECAUTIONS:  None  WEIGHT BEARING RESTRICTIONS: No  FALLS:  Has patient fallen in last 6 months? No  PLOF: Independent  PATIENT GOALS:  a little bit of relief  OBJECTIVE: (objective measures from initial evaluation unless otherwise dated)  MRI 8/25 right hip  IMPRESSION: 1. Right total hip arthroplasty with susceptibility artifact partially obscuring the adjacent soft tissue and osseous structures. No periarticular fluid collection or osteolysis. 2. Large amount of fluid in the right greater trochanteric bursa consistent with bursitis. 3. Mild osteoarthritis of the left hip.  PATIENT SURVEYS:  LEFS  Extreme difficulty/unable (0), Quite a bit of difficulty (1), Moderate difficulty (2), Little difficulty (3), No difficulty (4) Survey date:  07/07/24 09/02/24  Any of your usual work, housework or school activities 1 1  2. Usual hobbies, recreational or sporting activities 1 2  3. Getting into/out of the bath 1 1  4. Walking between rooms 2 2  5. Putting on socks/shoes 1 1  6. Squatting  2 1  7. Lifting an object, like a bag of groceries from the floor 2 2  8. Performing light activities around your home 2 2  9. Performing heavy activities around your home 0 0  10. Getting into/out of a car 2 1  11. Walking 2 blocks 0 1  12. Walking 1 mile 0 0  13. Going up/down 10 stairs (1 flight) 1 1  14. Standing for 1 hour 1 1  15.  sitting for 1 hour 2 2  16. Running on even ground 0 0  17. Running on uneven ground 0 0  18. Making sharp turns while running fast 0 0  19. Hopping  0 0  20. Rolling over in bed 1 1  Score total:  19/80 19/80     COGNITION: Overall cognitive status: Within functional limits for tasks assessed     SENSATION: WFL  POSTURE: No Significant postural limitations  PALPATION: TTP R greater Troch   LOWER EXTREMITY ROM:  Active ROM Right eval Left eval  Hip flexion    Hip extension    Hip abduction    Hip adduction    Hip internal rotation    Hip external  rotation    Knee flexion    Knee extension    Ankle dorsiflexion    Ankle plantarflexion    Ankle inversion    Ankle eversion     (Blank rows = not tested) *= pain/symptoms  LOWER EXTREMITY MMT:  MMT Right eval Left eval Right 09/02/24 Left 09/02/24  Hip flexion 4+ 5 4+ 5  Hip extension 4 4+ 4+ 5  Hip abduction (sidelying) 4-  4+ 5  Hip adduction      Hip internal rotation      Hip external rotation      Knee flexion 5 5 5 5   Knee extension 5 5 5 5   Ankle dorsiflexion      Ankle plantarflexion      Ankle inversion      Ankle eversion       (Blank rows = not tested) *= pain/symptoms  FUNCTIONAL TESTS:  5  times sit to stand: 26.97 seconds  Stairs: 7 inch, step too pattern, uses LLE 5xSTS 09/02/24: 17.38 without use of UEs  GAIT: Distance walked: 100 feet  Assistive device utilized: None Level of assistance: Complete Independence Comments: antalgic on RLE with truncal lean to R   TODAY'S TREATMENT:       09/30/24  Nustep L5x8 minutes all four extremities  STS 2x10 with 15# KB switching sides between rounds  Shuttle BLE press 168# 2x12  Shuttle single leg press 125# 2x12 surgical LE  Forward step ups 6 inch box 15# KB x10 B  Figure 4 stretch 2x30 seconds B hooklying Quad and hip flexor stretches 2x30 seconds B  SKTC 5x5 seconds B    Roller R lateral thigh and HS     09/27/24 Pt seen for aquatic therapy today.  Treatment took place in water  3.5-4.75 ft in depth at the [facility] pool. Temp of water  was 91.  Pt entered/exited the pool via stairs independently in step-to pattern with bil rail.   Exercise  - Stool Scoots  - Noodle press - Noodle Stomp   - Standing Balance on Noodle at [employer]   - Squat on Noodle  - Plank on Long Hand Float with Leg Lift   - Flutter Kicking/Windshield Wipers   - Runner's Step Up/Down      09/14/24 Pt seen for aquatic therapy today.  Treatment took place in water  3.5-4.75 ft in depth at the [facility] pool. Temp of water  was 91.  Pt entered/exited the pool via stairs independently in step-to pattern with bil rail.  - unsupported walking forward/ backward/side stepping prior to session starting (backward increases hip burning. -TrA set using big yellow noodle wide stance then staggered x 10 -standing balance on thick yellow noodle :wide stance rolling heel/to->slow march -> squats 2 x 5 -thick yellow noodle stomp LE  hip in neutral, and into hip ER x 12 slow each position -Green hand bell resisted arm swing wide stance then staggered 3 sets ea position 7 slow/7 fast  - rest period -vertical squats x 10-> x10 alternating driving vertically SL R/L -stool scoots using black noodle rocking forward/back bilaterally then SL->backward then forward 2 laps ea - cycling on thick white noodle  09/07/24 NuStep 5 min lvl 5 Supine SKTC x30 seconds Piriformis stretch x30 seconds with rocking  SL hip hinge with ball pushed into abduction Seated hamstring stretch x30 seconds  Leg press 3x10 150#         SL 2x10 100# Sit to stand with 15# KB Hand dyno test: 58lbs bilat in abduction Rolling to glutes and lateralis                                                                                                                  09/02/24 NuStep 5 min lvl 4  Reassessment  IASTM and STM to left ITB, piriformis, glute med, and biceps femoris  Seated hamstring stretch x30 seconds  Leg press 3x10 145#  09/01/24  Pt seen for aquatic therapy today.  Treatment took place in water  3.5-4.75 ft in depth at the [facility] pool. Temp of water  was 91.  Pt entered/exited the pool via stairs independently in step-to pattern with bil rail.  - unsupported walking forward/ backward/side stepping prior to session starting - backward walking lunges (some pain in Rt knee) - vertical squats -> side stepping into vertical squat R/L -plank at bench:  hip/knee flexion to hip/knee ext x 15; fire hydrants  x 10 each LE -thick yellow noodle stomp LE  hip in neutral, and into hip abdct, 2 sets of 10 reps (1 fast, 1 slow, each LE) -standing balance on thick yellow noodle :wide stance rolling heel/to->slow march -> squats x 10 - runners step ups x 10 each LE. -seated on 3rd step flutter kicking/hip flex/ext; hip add/abd,  3 sets 10 slow/20 fast. - STS from 3rd step with 4 sec decent each rep x 12 - cycling on thick white noodle  08/27/24 NuStep x 6 min  Manual: IASTM with Massage gun to R ITB and quads  LF leg extension 45# 3x10 LF leg press 110# x10, 140# 2x10 RDLs with 3# dowel 2x10 Active hamstring stretch 2x10 with 5 second hold      PATIENT EDUCATION:  Education details: exercise progression/ modification Person educated: Patient Education method: Programmer, Multimedia, Demonstration,  Education comprehension: verbalized understanding, returned demonstration, verbal cues required, and tactile cues required  HOME EXERCISE PROGRAM: Access Code: [email or web address] URL: [email or web address] Date: 07/07/2024 Prepared by: Prentice Zaunegger  Exercises - Supine Bridge  - 1 x daily - 7 x weekly - 3 sets - 10 reps - Clamshell (Mirrored)  - 1 x daily - 7 x weekly - 3 sets - 10 reps  Access Code: [email or web address] URL: [email or web address] Date: 09/27/2024 Prepared by: Frankie Ziemba  Exercises - Stool Scoots  - 1 x daily - 1-3 x weekly - Noodle press  - 1 x daily - 1-3 x weekly - 1-3 sets - 10 reps - Noodle Stomp  - 1 x daily - 1-3 x weekly - 1-2 sets - 10 reps - Standing Balance on Noodle at [employer]  - 1 x daily - 1-3 x weekly - 1-3 reps - 20 hold - Squat on Noodle  - 1 x daily - 1-3 x weekly - 1-3 sets - 10 reps - Plank on Long Hand Float with Leg Lift  - 1 x daily - 1-3 x weekly - 1-2 sets - 10 reps - Flutter Kicking/Windshield Wipers  - 1 x daily - 1-3 x weekly - 3 sets - 10 reps - Runner's Step Up/Down  - 1 x daily - 1-3 x weekly - 1-3 sets - 10 reps - arm swing resisted  with hand bells  - 1 x daily - 1-3 x weekly - 3 sets - 5-10 reps ASSESSMENT:  CLINICAL IMPRESSION:   Arrives today doing well, has planned surgery early December. Educated a bit on POC change with this- anticipate surgeon will likely refer him back to PT after that procedure. Progressed all interventions as tolerated. Due for formal progress note next visit.      Eval: Patient a 58 y.o. y.o. male who was seen today for physical therapy evaluation and treatment for S/P right hip trochanteric bursectomy and IT band release. Patient presents with pain limited deficits in R hip strength, ROM, endurance, activity tolerance, and functional mobility with ADL. Patient is having to modify and restrict ADL as indicated by outcome measure score  as well as subjective information and objective measures which is affecting overall participation. Patient will benefit from skilled physical therapy in order to improve function and reduce impairment.  OBJECTIVE IMPAIRMENTS: Abnormal gait, decreased activity tolerance, decreased balance, decreased endurance, decreased mobility, difficulty walking, decreased ROM, decreased strength, increased muscle spasms, impaired flexibility, improper body mechanics, and pain  ACTIVITY LIMITATIONS: lifting, bending, standing, squatting, stairs, transfers, locomotion level, and caring for others  PARTICIPATION LIMITATIONS: meal prep, cleaning, laundry, shopping, community activity, occupation, and yard work  PERSONAL FACTORS: Fitness and 3+ comorbidities: Hx R THA, R TKA, hx LBP, HTN are also affecting patient's functional outcome.   REHAB POTENTIAL: Good  CLINICAL DECISION MAKING: Evolving/moderate complexity  EVALUATION COMPLEXITY: Moderate   GOALS: Goals reviewed with patient? Yes  SHORT TERM GOALS: Target date: 08/04/2024    Patient will be independent with HEP in order to improve functional outcomes. Baseline: Goal status: Met 08/03/23  2.  Patient will report  at least 25% improvement in symptoms/functional status for improved quality of life. Baseline: Goal status: Met 09/02/24   LONG TERM GOALS: Target date: 09/01/2024  Patient will report at least 75% improvement in symptoms for improved quality of life. Baseline:  Goal status: Progressing 09/02/24 (40%)  2.  Patient will improve LEFS  score by at least 15 points in order to indicate improved tolerance to activity. Baseline:  Goal status: Progressing 09/02/24  3.  Patient will be able to navigate stairs with reciprocal pattern without compensation in order to demonstrate improved LE strength. Baseline:  Goal status: Progressing 09/02/24  4. Patient will demonstrate grade of 5/5 MMT grade in all tested musculature as evidence of improved strength to assist with stair ambulation and gait. Baseline:  Goal status: Progressing 09/02/24  5.  Patient will be able to complete 5x STS in under 15 seconds in order to reduce the risk of falls. Baseline:  Goal status: Progressing 09/02/24  PLAN:  PT FREQUENCY: 2x/week  PT DURATION: 8 weeks  PLANNED INTERVENTIONS: 97164- PT Re-evaluation, 97110-Therapeutic exercises, 97530- Therapeutic activity, 97112- Neuromuscular re-education, 97535- Self Care, 02859- Manual therapy, [phone]- Gait training, [phone]- Orthotic Fit/training, [phone]- Canalith repositioning, [id number]- Aquatic Therapy, [phone]- Splinting, [phone]- Wound care (first 20 sq cm), 97598- Wound care (each additional 20 sq cm)Patient/Family education, Balance training, Stair training, Taping, Dry Needling, Joint mobilization, Joint manipulation, Spinal manipulation, Spinal mobilization, Scar mobilization, and DME instructions.  PLAN FOR NEXT SESSION: hip strength, functional strength. 20th visit progress note next visit, update LEFS and objectives   Josette Rough, PT, DPT 09/30/24 9:29 AM

## 2024-10-04 ENCOUNTER — Encounter (HOSPITAL_BASED_OUTPATIENT_CLINIC_OR_DEPARTMENT_OTHER): Payer: Self-pay | Admitting: Physical Therapy

## 2024-10-04 ENCOUNTER — Ambulatory Visit (HOSPITAL_BASED_OUTPATIENT_CLINIC_OR_DEPARTMENT_OTHER): Admitting: Physical Therapy

## 2024-10-04 DIAGNOSIS — R29898 Other symptoms and signs involving the musculoskeletal system: Secondary | ICD-10-CM | POA: Diagnosis not present

## 2024-10-04 DIAGNOSIS — R2689 Other abnormalities of gait and mobility: Secondary | ICD-10-CM

## 2024-10-04 DIAGNOSIS — M25551 Pain in right hip: Secondary | ICD-10-CM

## 2024-10-04 DIAGNOSIS — M6281 Muscle weakness (generalized): Secondary | ICD-10-CM

## 2024-10-04 NOTE — Therapy (Signed)
 OUTPATIENT PHYSICAL THERAPY LOWER EXTREMITY TREATMENT Progress Note Reporting Period 08/16/24 to 10/04/24  See note below for Objective Data and Assessment of Progress/Goals.     Patient Name: Reginald [name] MRN: [id number] DOB:01-03-1966, 58 y.o., male Today's Date: 10/04/2024    END OF SESSION:  PT End of Session - 10/04/24 0904     Visit Number 20    Number of Visits 32    Date for Recertification  10/28/24    Authorization Type [employer]    Progress Note Due on Visit 30    PT Start Time 0800    PT Stop Time 0845    PT Time Calculation (min) 45 min    Activity Tolerance Patient tolerated treatment well    Behavior During Therapy [facility] for tasks assessed/performed               Past Medical History:  Diagnosis Date   Arthritis    Gallstones    Hypertension    Past Surgical History:  Procedure Laterality Date   CHOLECYSTECTOMY  12/03/2011   Procedure: LAPAROSCOPIC CHOLECYSTECTOMY WITH INTRAOPERATIVE CHOLANGIOGRAM;  Surgeon: Camellia CHRISTELLA Blush, MD;  Location: [facility] OR;  Service: General;  Laterality: N/A;  laparoscopic cholecystectomy with intraoperative cholangiogram   CHONDROPLASTY Right 09/19/2015   Procedure: CHONDROPLASTY;  Surgeon: Norleen Gavel, MD;  Location: [facility];  Service: Orthopedics;  Laterality: Right;   ENDOVENOUS ABLATION SAPHENOUS VEIN W/ LASER Right 01/13/2019   endovenous laser ablation right greater saphenous vein and stab phlebectomy 10-20 incisions right leg by Lonni Blade MD    IR ABLATE LIVER CRYOABLATION  04/03/2020   IR RADIOLOGIST EVAL & MGMT  03/28/2020   KNEE ARTHROSCOPY WITH LATERAL MENISECTOMY Right 09/19/2015   Procedure: KNEE ARTHROSCOPY WITH PARTIAL LATERAL MENISECTOMY;  Surgeon: Norleen Gavel, MD;  Location: [facility];  Service: Orthopedics;  Laterality: Right;   KNEE ARTHROSCOPY WITH MEDIAL MENISECTOMY Right 09/19/2015   Procedure: KNEE ARTHROSCOPY WITH PARTIAL MEDIAL MENISECTOMY;   Surgeon: Norleen Gavel, MD;  Location: [facility];  Service: Orthopedics;  Laterality: Right;   ROTATOR CUFF REPAIR Left    with revision   spider bite     black widow or brown recluse   TOE FUSION Left    TOTAL HIP ARTHROPLASTY Right 06/15/2023   Procedure: TOTAL HIP ARTHROPLASTY ANTERIOR APPROACH;  Surgeon: Gavel Norleen, MD;  Location: [facility];  Service: Orthopedics;  Laterality: Right;   TOTAL KNEE ARTHROPLASTY Right 03/31/2018   Procedure: RIGHT TOTAL KNEE ARTHROPLASTY;  Surgeon: Gavel Norleen, MD;  Location: [facility];  Service: Orthopedics;  Laterality: Right;   TOTAL KNEE REVISION Right 11/04/2019   Procedure: RIGHT TOTAL KNEE REVISION;  Surgeon: Gavel Norleen, MD;  Location: [facility];  Service: Orthopedics;  Laterality: Right;   TRIGGER FINGER RELEASE Right    Patient Active Problem List   Diagnosis Date Noted   Primary osteoarthritis of right hip 06/14/2023   Meralgia paraesthetica, right 06/10/2023   Spondylosis without myelopathy or radiculopathy, lumbar region 06/10/2023   Painful total knee replacement, right 11/04/2019   Arthrofibrosis of knee joint, right 11/04/2019   S/P revision of total knee, right 11/04/2019   Primary osteoarthritis of right knee 03/31/2018   Hypertension 12/02/2011   Obesity (BMI 30-39.9) 12/02/2011    PCP: [facility]  REFERRING PROVIDER: Gavel Norleen, MD  REFERRING DIAG: S/P right hip trochanteric bursectomy and IT band release  THERAPY DIAG:  Other symptoms and signs involving the musculoskeletal system  Pain  in right hip  Other abnormalities of gait and mobility  Muscle weakness (generalized)  Rationale for Evaluation and Treatment: Rehabilitation  ONSET DATE: 3 weeks ago  SUBJECTIVE:   SUBJECTIVE STATEMENT:  Pt reports cramping in right gastroc and hamstring after last land session for about  2 days. Pain right hip 5/10  PERTINENT HISTORY: Hx R THA, R TKA, hx LBP, HTN  PAIN:  Are you having pain?  Yes: NPRS scale: 5/10 Pain location: R hip, Rt groin and Rt distal lateral knee Pain description: aching, sharp Aggravating factors: ROM,  Relieving factors: rest  PRECAUTIONS: None  WEIGHT BEARING RESTRICTIONS: No  FALLS:  Has patient fallen in last 6 months? No  PLOF: Independent  PATIENT GOALS:  a little bit of relief  OBJECTIVE: (objective measures from initial evaluation unless otherwise dated)  MRI 8/25 right hip  IMPRESSION: 1. Right total hip arthroplasty with susceptibility artifact partially obscuring the adjacent soft tissue and osseous structures. No periarticular fluid collection or osteolysis. 2. Large amount of fluid in the right greater trochanteric bursa consistent with bursitis. 3. Mild osteoarthritis of the left hip.  PATIENT SURVEYS:  LEFS  Extreme difficulty/unable (0), Quite a bit of difficulty (1), Moderate difficulty (2), Little difficulty (3), No difficulty (4) Survey date:  07/07/24 09/02/24 10/04/24  Any of your usual work, housework or school activities 1 1   2. Usual hobbies, recreational or sporting activities 1 2   3. Getting into/out of the bath 1 1   4. Walking between rooms 2 2   5. Putting on socks/shoes 1 1   6. Squatting  2 1   7. Lifting an object, like a bag of groceries from the floor 2 2   8. Performing light activities around your home 2 2   9. Performing heavy activities around your home 0 0   10. Getting into/out of a car 2 1   11. Walking 2 blocks 0 1   12. Walking 1 mile 0 0   13. Going up/down 10 stairs (1 flight) 1 1   14. Standing for 1 hour 1 1   15.  sitting for 1 hour 2 2   16. Running on even ground 0 0   17. Running on uneven ground 0 0   18. Making sharp turns while running fast 0 0   19. Hopping  0 0   20. Rolling over in bed 1 1   Score total:  19/80 19/80 23/80      COGNITION: Overall cognitive status: Within functional limits for tasks assessed     SENSATION: WFL  POSTURE: No Significant postural  limitations  PALPATION: TTP R greater Troch   LOWER EXTREMITY ROM:  Active ROM Right eval Left eval  Hip flexion    Hip extension    Hip abduction    Hip adduction    Hip internal rotation    Hip external rotation    Knee flexion    Knee extension    Ankle dorsiflexion    Ankle plantarflexion    Ankle inversion    Ankle eversion     (Blank rows = not tested) *= pain/symptoms  LOWER EXTREMITY MMT:  MMT Right eval Left eval Right 09/02/24 Left 09/02/24 Right  10/04/24  Hip flexion 4+ 5 4+ 5 4+ -5-  Hip extension 4 4+ 4+ 5 4+ -5-  Hip abduction (sidelying) 4-  4+ 5 4+  Hip adduction       Hip internal rotation  Hip external rotation       Knee flexion 5 5 5 5    Knee extension 5 5 5 5    Ankle dorsiflexion       Ankle plantarflexion       Ankle inversion       Ankle eversion        (Blank rows = not tested) *= pain/symptoms  FUNCTIONAL TESTS:  5 times sit to stand: 26.97 seconds  Stairs: 7 inch, step too pattern, uses LLE 5xSTS 09/02/24: 17.38 without use of UEs  GAIT: Distance walked: 100 feet  Assistive device utilized: None Level of assistance: Complete Independence Comments: antalgic on RLE with truncal lean to R   TODAY'S TREATMENT:       10/04/24 Pt seen for aquatic therapy today.  Treatment took place in water  3.5-4.75 ft in depth at the [facility] pool. Temp of water  was 91.  Pt entered/exited the pool via stairs independently in step-to pattern with bil rail.   Exercise  - Stool Scoots/ rocking - Noodle press - Noodle Stomp neutral hip then ER. 10x slow/ 10x fast - Standing Balance on Noodle at [employer]: df/pf - Squat on Noodle  - Plank on Long [employer] with Leg Lift   - Flutter Kicking/Windshield Wipers ; sitting on step - Runner's Step Up/Down leading R/L x 10     09/30/24  Nustep L5x8 minutes all four extremities  STS 2x10 with 15# KB switching sides between rounds  Shuttle BLE press 168# 2x12  Shuttle single leg  press 125# 2x12 surgical LE  Forward step ups 6 inch box 15# KB x10 B  Figure 4 stretch 2x30 seconds B hooklying Quad and hip flexor stretches 2x30 seconds B  SKTC 5x5 seconds B    Roller R lateral thigh and HS     09/27/24 Pt seen for aquatic therapy today.  Treatment took place in water  3.5-4.75 ft in depth at the [facility] pool. Temp of water  was 91.  Pt entered/exited the pool via stairs independently in step-to pattern with bil rail.   Exercise  - Stool Scoots  - Noodle press - Noodle Stomp   - Standing Balance on Noodle at [employer]   - Squat on Noodle  - Plank on Long Hand Float with Leg Lift   - Flutter Kicking/Windshield Wipers   - Runner's Step Up/Down      09/14/24 Pt seen for aquatic therapy today.  Treatment took place in water  3.5-4.75 ft in depth at the [facility] pool. Temp of water  was 91.  Pt entered/exited the pool via stairs independently in step-to pattern with bil rail.  - unsupported walking forward/ backward/side stepping prior to session starting (backward increases hip burning. -TrA set using big yellow noodle wide stance then staggered x 10 -standing balance on thick yellow noodle :wide stance rolling heel/to->slow march -> squats 2 x 5 -thick yellow noodle stomp LE  hip in neutral, and into hip ER x 12 slow each position -Green hand bell resisted arm swing wide stance then staggered 3 sets ea position 7 slow/7 fast  - rest period -vertical squats x 10-> x10 alternating driving vertically SL R/L -stool scoots using black noodle rocking forward/back bilaterally then SL->backward then forward 2 laps ea - cycling on thick white noodle  09/07/24 NuStep 5 min lvl 5 Supine SKTC x30 seconds Piriformis stretch x30 seconds with rocking  SL hip hinge with ball pushed into abduction Seated hamstring stretch x30 seconds  Leg press  3x10 150#         SL 2x10 100# Sit to stand with 15# KB Hand dyno test: 58lbs bilat in  abduction Rolling to glutes and lateralis                                                                                                                  09/02/24 NuStep 5 min lvl 4  Reassessment  IASTM and STM to left ITB, piriformis, glute med, and biceps femoris  Seated hamstring stretch x30 seconds  Leg press 3x10 145#  09/01/24 Pt seen for aquatic therapy today.  Treatment took place in water  3.5-4.75 ft in depth at the [facility] pool. Temp of water  was 91.  Pt entered/exited the pool via stairs independently in step-to pattern with bil rail.  - unsupported walking forward/ backward/side stepping prior to session starting - backward walking lunges (some pain in Rt knee) - vertical squats -> side stepping into vertical squat R/L -plank at bench:  hip/knee flexion to hip/knee ext x 15; fire hydrants x 10 each LE -thick yellow noodle stomp LE  hip in neutral, and into hip abdct, 2 sets of 10 reps (1 fast, 1 slow, each LE) -standing balance on thick yellow noodle :wide stance rolling heel/to->slow march -> squats x 10 - runners step ups x 10 each LE. -seated on 3rd step flutter kicking/hip flex/ext; hip add/abd,  3 sets 10 slow/20 fast. - STS from 3rd step with 4 sec decent each rep x 12 - cycling on thick white noodle  08/27/24 NuStep x 6 min  Manual: IASTM with Massage gun to R ITB and quads  LF leg extension 45# 3x10 LF leg press 110# x10, 140# 2x10 RDLs with 3# dowel 2x10 Active hamstring stretch 2x10 with 5 second hold      PATIENT EDUCATION:  Education details: exercise progression/ modification Person educated: Patient Education method: Programmer, Multimedia, Demonstration,  Education comprehension: verbalized understanding, returned demonstration, verbal cues required, and tactile cues required  HOME EXERCISE PROGRAM: Access Code: [email or web address] URL: [email or web address] Date: 07/07/2024 Prepared by: Prentice Zaunegger  Exercises - Supine Bridge  -  1 x daily - 7 x weekly - 3 sets - 10 reps - Clamshell (Mirrored)  - 1 x daily - 7 x weekly - 3 sets - 10 reps  Access Code: [email or web address] URL: [email or web address] Date: 09/27/2024 Prepared by: Frankie Lainy Wrobleski  Exercises - Stool Scoots  - 1 x daily - 1-3 x weekly - Noodle press  - 1 x daily - 1-3 x weekly - 1-3 sets - 10 reps - Noodle Stomp  - 1 x daily - 1-3 x weekly - 1-2 sets - 10 reps - Standing Balance on Noodle at [employer]  - 1 x daily - 1-3 x weekly - 1-3 reps - 20 hold - Squat on Noodle  - 1 x daily - 1-3 x weekly - 1-3 sets - 10 reps - Plank on Long Hand Float with Leg Lift  - 1 x daily - 1-3 x weekly -  1-2 sets - 10 reps - Flutter Kicking/Windshield Wipers  - 1 x daily - 1-3 x weekly - 3 sets - 10 reps - Runner's Step Up/Down  - 1 x daily - 1-3 x weekly - 1-3 sets - 10 reps - arm swing resisted with hand bells  - 1 x daily - 1-3 x weekly - 3 sets - 5-10 reps ASSESSMENT:  CLINICAL IMPRESSION: PN:Pt here for final aquatic session as he has reached his max potential in setting.  He will continue to be seen land based up through planned surgical repair. May require return to setting following protocol. He is issued final HEP with instruction. Pt continues to have pain limiting function in right hip. He has had an improvement in LEFS demonstrating an improvement in her perceived function by 4 points. He consistently completes HEP both land and aquatics which is maintaining his LE strength and balance.  He will continue to benefit from skilled PT intervention (land based) to further progress toward stated goals and ready pt for surgical procedure.      Eval: Patient a 58 y.o. y.o. male who was seen today for physical therapy evaluation and treatment for S/P right hip trochanteric bursectomy and IT band release. Patient presents with pain limited deficits in R hip strength, ROM, endurance, activity tolerance, and functional mobility with ADL. Patient is having to modify and  restrict ADL as indicated by outcome measure score as well as subjective information and objective measures which is affecting overall participation. Patient will benefit from skilled physical therapy in order to improve function and reduce impairment.  OBJECTIVE IMPAIRMENTS: Abnormal gait, decreased activity tolerance, decreased balance, decreased endurance, decreased mobility, difficulty walking, decreased ROM, decreased strength, increased muscle spasms, impaired flexibility, improper body mechanics, and pain  ACTIVITY LIMITATIONS: lifting, bending, standing, squatting, stairs, transfers, locomotion level, and caring for others  PARTICIPATION LIMITATIONS: meal prep, cleaning, laundry, shopping, community activity, occupation, and yard work  PERSONAL FACTORS: Fitness and 3+ comorbidities: Hx R THA, R TKA, hx LBP, HTN are also affecting patient's functional outcome.   REHAB POTENTIAL: Good  CLINICAL DECISION MAKING: Evolving/moderate complexity  EVALUATION COMPLEXITY: Moderate   GOALS: Goals reviewed with patient? Yes  SHORT TERM GOALS: Target date: 08/04/2024    Patient will be independent with HEP in order to improve functional outcomes. Baseline: Goal status: Met 08/03/23  2.  Patient will report at least 25% improvement in symptoms/functional status for improved quality of life. Baseline: Goal status: Met 09/02/24   LONG TERM GOALS: Target date: 09/01/2024  Patient will report at least 75% improvement in symptoms for improved quality of life. Baseline:  Goal status: Progressing 09/02/24 (40%)  2.  Patient will improve LEFS  score by at least 15 points in order to indicate improved tolerance to activity. Baseline:  Goal status: Progressing 09/02/24; progressing 10/04/24  3.  Patient will be able to navigate stairs with reciprocal pattern without compensation in order to demonstrate improved LE strength. Baseline:  Goal status: Progressing 09/02/24  4. Patient will  demonstrate grade of 5/5 MMT grade in all tested musculature as evidence of improved strength to assist with stair ambulation and gait. Baseline:  Goal status: Progressing 09/02/24; Partially met (LLE)/ progressing R 10/04/24  5.  Patient will be able to complete 5x STS in under 15 seconds in order to reduce the risk of falls. Baseline:  Goal status: Progressing 09/02/24  PLAN:  PT FREQUENCY: 2x/week  PT DURATION: 8 weeks  PLANNED INTERVENTIONS: 02835- PT Re-evaluation, 97110-Therapeutic  exercises, 97530- Therapeutic activity, [id number]- Neuromuscular re-education, [phone]- Self Care, 02859- Manual therapy, [phone]- Gait training, [phone]- Orthotic Fit/training, [phone]- Canalith repositioning, [id number]- Aquatic Therapy, [phone]- Splinting, 97597- Wound care (first 20 sq cm), 97598- Wound care (each additional 20 sq cm)Patient/Family education, Balance training, Stair training, Taping, Dry Needling, Joint mobilization, Joint manipulation, Spinal manipulation, Spinal mobilization, Scar mobilization, and DME instructions.  PLAN FOR NEXT SESSION: hip strength, functional strength.   [address] [place]) Denice Cardon MPT 10/04/24 1:22 PM [facility] [address] [place], [place], [address] Phone: [phone]   Fax:  [phone]

## 2024-10-07 ENCOUNTER — Ambulatory Visit (HOSPITAL_BASED_OUTPATIENT_CLINIC_OR_DEPARTMENT_OTHER): Admitting: Physical Therapy

## 2024-10-07 ENCOUNTER — Encounter (HOSPITAL_BASED_OUTPATIENT_CLINIC_OR_DEPARTMENT_OTHER): Payer: Self-pay | Admitting: Physical Therapy

## 2024-10-07 DIAGNOSIS — R29898 Other symptoms and signs involving the musculoskeletal system: Secondary | ICD-10-CM

## 2024-10-07 DIAGNOSIS — R2689 Other abnormalities of gait and mobility: Secondary | ICD-10-CM

## 2024-10-07 DIAGNOSIS — M25551 Pain in right hip: Secondary | ICD-10-CM

## 2024-10-07 DIAGNOSIS — M6281 Muscle weakness (generalized): Secondary | ICD-10-CM

## 2024-10-07 NOTE — Therapy (Signed)
 OUTPATIENT PHYSICAL THERAPY LOWER EXTREMITY TREATMENT    Patient Name: Reginald [name] MRN: [id number] DOB:August 08, 1966, 58 y.o., male Today's Date: 10/07/2024    END OF SESSION:  PT End of Session - 10/07/24 0843     Visit Number 21    Number of Visits 32    Date for Recertification  10/28/24    Authorization Type [employer]    Progress Note Due on Visit 30    PT Start Time 0847    PT Stop Time 0928    PT Time Calculation (min) 41 min    Activity Tolerance Patient tolerated treatment well    Behavior During Therapy [facility] for tasks assessed/performed                Past Medical History:  Diagnosis Date   Arthritis    Gallstones    Hypertension    Past Surgical History:  Procedure Laterality Date   CHOLECYSTECTOMY  12/03/2011   Procedure: LAPAROSCOPIC CHOLECYSTECTOMY WITH INTRAOPERATIVE CHOLANGIOGRAM;  Surgeon: Camellia CHRISTELLA Blush, MD;  Location: [facility] OR;  Service: General;  Laterality: N/A;  laparoscopic cholecystectomy with intraoperative cholangiogram   CHONDROPLASTY Right 09/19/2015   Procedure: CHONDROPLASTY;  Surgeon: Norleen Gavel, MD;  Location: [facility];  Service: Orthopedics;  Laterality: Right;   ENDOVENOUS ABLATION SAPHENOUS VEIN W/ LASER Right 01/13/2019   endovenous laser ablation right greater saphenous vein and stab phlebectomy 10-20 incisions right leg by Lonni Blade MD    IR ABLATE LIVER CRYOABLATION  04/03/2020   IR RADIOLOGIST EVAL & MGMT  03/28/2020   KNEE ARTHROSCOPY WITH LATERAL MENISECTOMY Right 09/19/2015   Procedure: KNEE ARTHROSCOPY WITH PARTIAL LATERAL MENISECTOMY;  Surgeon: Norleen Gavel, MD;  Location: [facility];  Service: Orthopedics;  Laterality: Right;   KNEE ARTHROSCOPY WITH MEDIAL MENISECTOMY Right 09/19/2015   Procedure: KNEE ARTHROSCOPY WITH PARTIAL MEDIAL MENISECTOMY;  Surgeon: Norleen Gavel, MD;  Location: [facility];  Service: Orthopedics;  Laterality: Right;   ROTATOR CUFF  REPAIR Left    with revision   spider bite     black widow or brown recluse   TOE FUSION Left    TOTAL HIP ARTHROPLASTY Right 06/15/2023   Procedure: TOTAL HIP ARTHROPLASTY ANTERIOR APPROACH;  Surgeon: Gavel Norleen, MD;  Location: [facility];  Service: Orthopedics;  Laterality: Right;   TOTAL KNEE ARTHROPLASTY Right 03/31/2018   Procedure: RIGHT TOTAL KNEE ARTHROPLASTY;  Surgeon: Gavel Norleen, MD;  Location: [facility];  Service: Orthopedics;  Laterality: Right;   TOTAL KNEE REVISION Right 11/04/2019   Procedure: RIGHT TOTAL KNEE REVISION;  Surgeon: Gavel Norleen, MD;  Location: [facility];  Service: Orthopedics;  Laterality: Right;   TRIGGER FINGER RELEASE Right    Patient Active Problem List   Diagnosis Date Noted   Primary osteoarthritis of right hip 06/14/2023   Meralgia paraesthetica, right 06/10/2023   Spondylosis without myelopathy or radiculopathy, lumbar region 06/10/2023   Painful total knee replacement, right 11/04/2019   Arthrofibrosis of knee joint, right 11/04/2019   S/P revision of total knee, right 11/04/2019   Primary osteoarthritis of right knee 03/31/2018   Hypertension 12/02/2011   Obesity (BMI 30-39.9) 12/02/2011    PCP: [facility]  REFERRING PROVIDER: Gavel Norleen, MD  REFERRING DIAG: S/P right hip trochanteric bursectomy and IT band release  THERAPY DIAG:  Other symptoms and signs involving the musculoskeletal system  Pain in right hip  Other abnormalities of gait and mobility  Muscle weakness (generalized)  Rationale for Evaluation  and Treatment: Rehabilitation  ONSET DATE: 3 weeks ago  SUBJECTIVE:   SUBJECTIVE STATEMENT:  Had some cramping after last land session, hurt some after water  too but it was OK. Left leg is hurting today, hurt it awhile ago falling off the back of a truck.   PERTINENT HISTORY: Hx R THA, R TKA, hx LBP, HTN  PAIN:  Are you having pain? Yes: NPRS scale: 6/10 Pain location: R hip, Rt groin and Rt distal  lateral knee. L knee and L shoulder  Pain description: aching, sharp Aggravating factors: not sure  Relieving factors: nothing/time and rest   PRECAUTIONS: None  WEIGHT BEARING RESTRICTIONS: No  FALLS:  Has patient fallen in last 6 months? No  PLOF: Independent  PATIENT GOALS:  a little bit of relief  OBJECTIVE: (objective measures from initial evaluation unless otherwise dated)  MRI 8/25 right hip  IMPRESSION: 1. Right total hip arthroplasty with susceptibility artifact partially obscuring the adjacent soft tissue and osseous structures. No periarticular fluid collection or osteolysis. 2. Large amount of fluid in the right greater trochanteric bursa consistent with bursitis. 3. Mild osteoarthritis of the left hip.  PATIENT SURVEYS:  LEFS  Extreme difficulty/unable (0), Quite a bit of difficulty (1), Moderate difficulty (2), Little difficulty (3), No difficulty (4) Survey date:  07/07/24 09/02/24 10/04/24  Any of your usual work, housework or school activities 1 1   2. Usual hobbies, recreational or sporting activities 1 2   3. Getting into/out of the bath 1 1   4. Walking between rooms 2 2   5. Putting on socks/shoes 1 1   6. Squatting  2 1   7. Lifting an object, like a bag of groceries from the floor 2 2   8. Performing light activities around your home 2 2   9. Performing heavy activities around your home 0 0   10. Getting into/out of a car 2 1   11. Walking 2 blocks 0 1   12. Walking 1 mile 0 0   13. Going up/down 10 stairs (1 flight) 1 1   14. Standing for 1 hour 1 1   15.  sitting for 1 hour 2 2   16. Running on even ground 0 0   17. Running on uneven ground 0 0   18. Making sharp turns while running fast 0 0   19. Hopping  0 0   20. Rolling over in bed 1 1   Score total:  19/80 19/80 23/80      COGNITION: Overall cognitive status: Within functional limits for tasks assessed     SENSATION: WFL  POSTURE: No Significant postural  limitations  PALPATION: TTP R greater Troch   LOWER EXTREMITY ROM:  Active ROM Right eval Left eval  Hip flexion    Hip extension    Hip abduction    Hip adduction    Hip internal rotation    Hip external rotation    Knee flexion    Knee extension    Ankle dorsiflexion    Ankle plantarflexion    Ankle inversion    Ankle eversion     (Blank rows = not tested) *= pain/symptoms  LOWER EXTREMITY MMT:  MMT Right eval Left eval Right 09/02/24 Left 09/02/24 Right  10/04/24  Hip flexion 4+ 5 4+ 5 4+ -5-  Hip extension 4 4+ 4+ 5 4+ -5-  Hip abduction (sidelying) 4-  4+ 5 4+  Hip adduction       Hip internal  rotation       Hip external rotation       Knee flexion 5 5 5 5    Knee extension 5 5 5 5    Ankle dorsiflexion       Ankle plantarflexion       Ankle inversion       Ankle eversion        (Blank rows = not tested) *= pain/symptoms  FUNCTIONAL TESTS:  5 times sit to stand: 26.97 seconds  Stairs: 7 inch, step too pattern, uses LLE 5xSTS 09/02/24: 17.38 without use of UEs  GAIT: Distance walked: 100 feet  Assistive device utilized: None Level of assistance: Complete Independence Comments: antalgic on RLE with truncal lean to R   TODAY'S TREATMENT:       10/07/24  Nustep seat 10 all four extremities L5x8 minutes   Hip flexor stretch off edge of mat table 2x30 seconds Figure four stretch 2x30 seconds B Hooklying HS 2x30 seconds B with sheet Lumbar rotation stretch 5x5 seconds B SKTC 5x5 seconds B   Roller R HS and lateral thigh     10/04/24 Pt seen for aquatic therapy today.  Treatment took place in water  3.5-4.75 ft in depth at the [facility] pool. Temp of water  was 91.  Pt entered/exited the pool via stairs independently in step-to pattern with bil rail.   Exercise  - Stool Scoots/ rocking - Noodle press - Noodle Stomp neutral hip then ER. 10x slow/ 10x fast - Standing Balance on Noodle at [employer]: df/pf - Squat on Noodle  - Plank on  Long [employer] with Leg Lift   - Flutter Kicking/Windshield Wipers ; sitting on step - Runner's Step Up/Down leading R/L x 10     09/30/24  Nustep L5x8 minutes all four extremities  STS 2x10 with 15# KB switching sides between rounds  Shuttle BLE press 168# 2x12  Shuttle single leg press 125# 2x12 surgical LE  Forward step ups 6 inch box 15# KB x10 B  Figure 4 stretch 2x30 seconds B hooklying Quad and hip flexor stretches 2x30 seconds B  SKTC 5x5 seconds B    Roller R lateral thigh and HS     09/27/24 Pt seen for aquatic therapy today.  Treatment took place in water  3.5-4.75 ft in depth at the [facility] pool. Temp of water  was 91.  Pt entered/exited the pool via stairs independently in step-to pattern with bil rail.   Exercise  - Stool Scoots  - Noodle press - Noodle Stomp   - Standing Balance on Noodle at [employer]   - Squat on Noodle  - Plank on Long Hand Float with Leg Lift   - Flutter Kicking/Windshield Wipers   - Runner's Step Up/Down      09/14/24 Pt seen for aquatic therapy today.  Treatment took place in water  3.5-4.75 ft in depth at the [facility] pool. Temp of water  was 91.  Pt entered/exited the pool via stairs independently in step-to pattern with bil rail.  - unsupported walking forward/ backward/side stepping prior to session starting (backward increases hip burning. -TrA set using big yellow noodle wide stance then staggered x 10 -standing balance on thick yellow noodle :wide stance rolling heel/to->slow march -> squats 2 x 5 -thick yellow noodle stomp LE  hip in neutral, and into hip ER x 12 slow each position -Green hand bell resisted arm swing wide stance then staggered 3 sets ea position 7 slow/7 fast  - rest period -vertical  squats x 10-> x10 alternating driving vertically SL R/L -stool scoots using black noodle rocking forward/back bilaterally then SL->backward then forward 2 laps ea - cycling on thick white  noodle  09/07/24 NuStep 5 min lvl 5 Supine SKTC x30 seconds Piriformis stretch x30 seconds with rocking  SL hip hinge with ball pushed into abduction Seated hamstring stretch x30 seconds  Leg press 3x10 150#         SL 2x10 100# Sit to stand with 15# KB Hand dyno test: 58lbs bilat in abduction Rolling to glutes and lateralis                                                                                                                  09/02/24 NuStep 5 min lvl 4  Reassessment  IASTM and STM to left ITB, piriformis, glute med, and biceps femoris  Seated hamstring stretch x30 seconds  Leg press 3x10 145#  09/01/24 Pt seen for aquatic therapy today.  Treatment took place in water  3.5-4.75 ft in depth at the [facility] pool. Temp of water  was 91.  Pt entered/exited the pool via stairs independently in step-to pattern with bil rail.  - unsupported walking forward/ backward/side stepping prior to session starting - backward walking lunges (some pain in Rt knee) - vertical squats -> side stepping into vertical squat R/L -plank at bench:  hip/knee flexion to hip/knee ext x 15; fire hydrants x 10 each LE -thick yellow noodle stomp LE  hip in neutral, and into hip abdct, 2 sets of 10 reps (1 fast, 1 slow, each LE) -standing balance on thick yellow noodle :wide stance rolling heel/to->slow march -> squats x 10 - runners step ups x 10 each LE. -seated on 3rd step flutter kicking/hip flex/ext; hip add/abd,  3 sets 10 slow/20 fast. - STS from 3rd step with 4 sec decent each rep x 12 - cycling on thick white noodle  08/27/24 NuStep x 6 min  Manual: IASTM with Massage gun to R ITB and quads  LF leg extension 45# 3x10 LF leg press 110# x10, 140# 2x10 RDLs with 3# dowel 2x10 Active hamstring stretch 2x10 with 5 second hold      PATIENT EDUCATION:  Education details: exercise progression/ modification Person educated: Patient Education method: Programmer, Multimedia, Demonstration,   Education comprehension: verbalized understanding, returned demonstration, verbal cues required, and tactile cues required  HOME EXERCISE PROGRAM: Access Code: [email or web address] URL: [email or web address] Date: 07/07/2024 Prepared by: Prentice Zaunegger  Exercises - Supine Bridge  - 1 x daily - 7 x weekly - 3 sets - 10 reps - Clamshell (Mirrored)  - 1 x daily - 7 x weekly - 3 sets - 10 reps  Access Code: [email or web address] URL: [email or web address] Date: 09/27/2024 Prepared by: Frankie Ziemba  Exercises - Stool Scoots  - 1 x daily - 1-3 x weekly - Noodle press  - 1 x daily - 1-3 x weekly - 1-3 sets - 10 reps - Noodle Stomp  - 1 x daily - 1-3 x weekly - 1-2  sets - 10 reps - Standing Balance on Noodle at [employer]  - 1 x daily - 1-3 x weekly - 1-3 reps - 20 hold - Squat on Noodle  - 1 x daily - 1-3 x weekly - 1-3 sets - 10 reps - Plank on Long Hand Float with Leg Lift  - 1 x daily - 1-3 x weekly - 1-2 sets - 10 reps - Flutter Kicking/Windshield Wipers  - 1 x daily - 1-3 x weekly - 3 sets - 10 reps - Runner's Step Up/Down  - 1 x daily - 1-3 x weekly - 1-3 sets - 10 reps - arm swing resisted with hand bells  - 1 x daily - 1-3 x weekly - 3 sets - 5-10 reps ASSESSMENT:  CLINICAL IMPRESSION:  Arrives today doing OK, unfortunately he is having a day with higher levels of pain- per pt, these come and go on their own. We modified session today with less standing exercises/increased focus on stretching and proximal strengthening today with goal of at least some pain relief. Pain down to 5/10 at EOS.      Eval: Patient a 58 y.o. y.o. male who was seen today for physical therapy evaluation and treatment for S/P right hip trochanteric bursectomy and IT band release. Patient presents with pain limited deficits in R hip strength, ROM, endurance, activity tolerance, and functional mobility with ADL. Patient is having to modify and restrict ADL as indicated by outcome measure score as  well as subjective information and objective measures which is affecting overall participation. Patient will benefit from skilled physical therapy in order to improve function and reduce impairment.  OBJECTIVE IMPAIRMENTS: Abnormal gait, decreased activity tolerance, decreased balance, decreased endurance, decreased mobility, difficulty walking, decreased ROM, decreased strength, increased muscle spasms, impaired flexibility, improper body mechanics, and pain  ACTIVITY LIMITATIONS: lifting, bending, standing, squatting, stairs, transfers, locomotion level, and caring for others  PARTICIPATION LIMITATIONS: meal prep, cleaning, laundry, shopping, community activity, occupation, and yard work  PERSONAL FACTORS: Fitness and 3+ comorbidities: Hx R THA, R TKA, hx LBP, HTN are also affecting patient's functional outcome.   REHAB POTENTIAL: Good  CLINICAL DECISION MAKING: Evolving/moderate complexity  EVALUATION COMPLEXITY: Moderate   GOALS: Goals reviewed with patient? Yes  SHORT TERM GOALS: Target date: 08/04/2024    Patient will be independent with HEP in order to improve functional outcomes. Baseline: Goal status: Met 08/03/23  2.  Patient will report at least 25% improvement in symptoms/functional status for improved quality of life. Baseline: Goal status: Met 09/02/24   LONG TERM GOALS: Target date: 09/01/2024  Patient will report at least 75% improvement in symptoms for improved quality of life. Baseline:  Goal status: Progressing 09/02/24 (40%)  2.  Patient will improve LEFS  score by at least 15 points in order to indicate improved tolerance to activity. Baseline:  Goal status: Progressing 09/02/24; progressing 10/04/24  3.  Patient will be able to navigate stairs with reciprocal pattern without compensation in order to demonstrate improved LE strength. Baseline:  Goal status: Progressing 09/02/24  4. Patient will demonstrate grade of 5/5 MMT grade in all tested  musculature as evidence of improved strength to assist with stair ambulation and gait. Baseline:  Goal status: Progressing 09/02/24; Partially met (LLE)/ progressing R 10/04/24  5.  Patient will be able to complete 5x STS in under 15 seconds in order to reduce the risk of falls. Baseline:  Goal status: Progressing 09/02/24  PLAN:  PT FREQUENCY: 2x/week  PT DURATION:  8 weeks  PLANNED INTERVENTIONS: 97164- PT Re-evaluation, 97110-Therapeutic exercises, 97530- Therapeutic activity, [id number]- Neuromuscular re-education, 97535- Self Care, [id number]- Manual therapy, [phone]- Gait training, [phone]- Orthotic Fit/training, [phone]- Canalith repositioning, [id number]- Aquatic Therapy, [phone]- Splinting, 97597- Wound care (first 20 sq cm), 97598- Wound care (each additional 20 sq cm)Patient/Family education, Balance training, Stair training, Taping, Dry Needling, Joint mobilization, Joint manipulation, Spinal manipulation, Spinal mobilization, Scar mobilization, and DME instructions.  PLAN FOR NEXT SESSION: hip strength, functional strength. Has pain calmed down from last session?   Josette Rough, PT, DPT 10/07/24 9:30 AM

## 2024-10-10 ENCOUNTER — Ambulatory Visit (HOSPITAL_BASED_OUTPATIENT_CLINIC_OR_DEPARTMENT_OTHER): Admitting: Physical Therapy

## 2024-10-11 DIAGNOSIS — Z79891 Long term (current) use of opiate analgesic: Secondary | ICD-10-CM | POA: Diagnosis not present

## 2024-10-11 DIAGNOSIS — M25561 Pain in right knee: Secondary | ICD-10-CM | POA: Diagnosis not present

## 2024-10-11 DIAGNOSIS — Z96641 Presence of right artificial hip joint: Secondary | ICD-10-CM | POA: Diagnosis not present

## 2024-10-11 DIAGNOSIS — M25551 Pain in right hip: Secondary | ICD-10-CM | POA: Diagnosis not present

## 2024-10-11 DIAGNOSIS — G8929 Other chronic pain: Secondary | ICD-10-CM | POA: Diagnosis not present

## 2024-10-11 DIAGNOSIS — M2550 Pain in unspecified joint: Secondary | ICD-10-CM | POA: Diagnosis not present

## 2024-10-11 DIAGNOSIS — Z96651 Presence of right artificial knee joint: Secondary | ICD-10-CM | POA: Diagnosis not present

## 2024-10-12 ENCOUNTER — Ambulatory Visit (HOSPITAL_BASED_OUTPATIENT_CLINIC_OR_DEPARTMENT_OTHER): Admitting: Physical Therapy

## 2024-10-12 ENCOUNTER — Encounter (HOSPITAL_BASED_OUTPATIENT_CLINIC_OR_DEPARTMENT_OTHER): Payer: Self-pay | Admitting: Physical Therapy

## 2024-10-12 DIAGNOSIS — R29898 Other symptoms and signs involving the musculoskeletal system: Secondary | ICD-10-CM

## 2024-10-12 DIAGNOSIS — M6281 Muscle weakness (generalized): Secondary | ICD-10-CM

## 2024-10-12 DIAGNOSIS — R2689 Other abnormalities of gait and mobility: Secondary | ICD-10-CM

## 2024-10-12 DIAGNOSIS — M25551 Pain in right hip: Secondary | ICD-10-CM

## 2024-10-12 NOTE — Therapy (Signed)
 OUTPATIENT PHYSICAL THERAPY LOWER EXTREMITY TREATMENT    Patient Name: Reginald [name] MRN: [id number] DOB:1966-07-12, 58 y.o., male Today's Date: 10/12/2024    END OF SESSION:  PT End of Session - 10/12/24 0905     Visit Number 22    Number of Visits 32    Date for Recertification  10/28/24    Authorization Type [employer]    Progress Note Due on Visit 30    PT Start Time 0845    PT Stop Time 0928    PT Time Calculation (min) 43 min    Activity Tolerance Patient tolerated treatment well    Behavior During Therapy [facility] for tasks assessed/performed                 Past Medical History:  Diagnosis Date   Arthritis    Gallstones    Hypertension    Past Surgical History:  Procedure Laterality Date   CHOLECYSTECTOMY  12/03/2011   Procedure: LAPAROSCOPIC CHOLECYSTECTOMY WITH INTRAOPERATIVE CHOLANGIOGRAM;  Surgeon: Camellia CHRISTELLA Blush, MD;  Location: [facility] OR;  Service: General;  Laterality: N/A;  laparoscopic cholecystectomy with intraoperative cholangiogram   CHONDROPLASTY Right 09/19/2015   Procedure: CHONDROPLASTY;  Surgeon: Norleen Gavel, MD;  Location: [facility];  Service: Orthopedics;  Laterality: Right;   ENDOVENOUS ABLATION SAPHENOUS VEIN W/ LASER Right 01/13/2019   endovenous laser ablation right greater saphenous vein and stab phlebectomy 10-20 incisions right leg by Lonni Blade MD    IR ABLATE LIVER CRYOABLATION  04/03/2020   IR RADIOLOGIST EVAL & MGMT  03/28/2020   KNEE ARTHROSCOPY WITH LATERAL MENISECTOMY Right 09/19/2015   Procedure: KNEE ARTHROSCOPY WITH PARTIAL LATERAL MENISECTOMY;  Surgeon: Norleen Gavel, MD;  Location: [facility];  Service: Orthopedics;  Laterality: Right;   KNEE ARTHROSCOPY WITH MEDIAL MENISECTOMY Right 09/19/2015   Procedure: KNEE ARTHROSCOPY WITH PARTIAL MEDIAL MENISECTOMY;  Surgeon: Norleen Gavel, MD;  Location: [facility];  Service: Orthopedics;  Laterality: Right;   ROTATOR CUFF  REPAIR Left    with revision   spider bite     black widow or brown recluse   TOE FUSION Left    TOTAL HIP ARTHROPLASTY Right 06/15/2023   Procedure: TOTAL HIP ARTHROPLASTY ANTERIOR APPROACH;  Surgeon: Gavel Norleen, MD;  Location: [facility];  Service: Orthopedics;  Laterality: Right;   TOTAL KNEE ARTHROPLASTY Right 03/31/2018   Procedure: RIGHT TOTAL KNEE ARTHROPLASTY;  Surgeon: Gavel Norleen, MD;  Location: [facility];  Service: Orthopedics;  Laterality: Right;   TOTAL KNEE REVISION Right 11/04/2019   Procedure: RIGHT TOTAL KNEE REVISION;  Surgeon: Gavel Norleen, MD;  Location: [facility];  Service: Orthopedics;  Laterality: Right;   TRIGGER FINGER RELEASE Right    Patient Active Problem List   Diagnosis Date Noted   Primary osteoarthritis of right hip 06/14/2023   Meralgia paraesthetica, right 06/10/2023   Spondylosis without myelopathy or radiculopathy, lumbar region 06/10/2023   Painful total knee replacement, right 11/04/2019   Arthrofibrosis of knee joint, right 11/04/2019   S/P revision of total knee, right 11/04/2019   Primary osteoarthritis of right knee 03/31/2018   Hypertension 12/02/2011   Obesity (BMI 30-39.9) 12/02/2011    PCP: [facility]  REFERRING PROVIDER: Gavel Norleen, MD  REFERRING DIAG: S/P right hip trochanteric bursectomy and IT band release  THERAPY DIAG:  Other symptoms and signs involving the musculoskeletal system  Pain in right hip  Other abnormalities of gait and mobility  Muscle weakness (generalized)  Rationale for  Evaluation and Treatment: Rehabilitation  ONSET DATE: 3 weeks ago  SUBJECTIVE:   SUBJECTIVE STATEMENT:  Leg is feeling better today, it just needed some time on its own to recover a bit   PERTINENT HISTORY: Hx R THA, R TKA, hx LBP, HTN  PAIN:  Are you having pain? Yes: NPRS scale: 5/10 Pain location: R hip, Rt groin and Rt distal lateral knee. L knee and L shoulder  Pain description: aching, sharp L knee;  everywhere else is more an ache, R hip feels like it has a pressure on it  Aggravating factors: not sure  Relieving factors: nothing/time and rest   PRECAUTIONS: None  WEIGHT BEARING RESTRICTIONS: No  FALLS:  Has patient fallen in last 6 months? No  PLOF: Independent  PATIENT GOALS:  a little bit of relief  OBJECTIVE: (objective measures from initial evaluation unless otherwise dated)  MRI 8/25 right hip  IMPRESSION: 1. Right total hip arthroplasty with susceptibility artifact partially obscuring the adjacent soft tissue and osseous structures. No periarticular fluid collection or osteolysis. 2. Large amount of fluid in the right greater trochanteric bursa consistent with bursitis. 3. Mild osteoarthritis of the left hip.  PATIENT SURVEYS:  LEFS  Extreme difficulty/unable (0), Quite a bit of difficulty (1), Moderate difficulty (2), Little difficulty (3), No difficulty (4) Survey date:  07/07/24 09/02/24 10/04/24  Any of your usual work, housework or school activities 1 1   2. Usual hobbies, recreational or sporting activities 1 2   3. Getting into/out of the bath 1 1   4. Walking between rooms 2 2   5. Putting on socks/shoes 1 1   6. Squatting  2 1   7. Lifting an object, like a bag of groceries from the floor 2 2   8. Performing light activities around your home 2 2   9. Performing heavy activities around your home 0 0   10. Getting into/out of a car 2 1   11. Walking 2 blocks 0 1   12. Walking 1 mile 0 0   13. Going up/down 10 stairs (1 flight) 1 1   14. Standing for 1 hour 1 1   15.  sitting for 1 hour 2 2   16. Running on even ground 0 0   17. Running on uneven ground 0 0   18. Making sharp turns while running fast 0 0   19. Hopping  0 0   20. Rolling over in bed 1 1   Score total:  19/80 19/80 23/80      COGNITION: Overall cognitive status: Within functional limits for tasks assessed     SENSATION: WFL  POSTURE: No Significant postural  limitations  PALPATION: TTP R greater Troch   LOWER EXTREMITY ROM:  Active ROM Right eval Left eval  Hip flexion    Hip extension    Hip abduction    Hip adduction    Hip internal rotation    Hip external rotation    Knee flexion    Knee extension    Ankle dorsiflexion    Ankle plantarflexion    Ankle inversion    Ankle eversion     (Blank rows = not tested) *= pain/symptoms  LOWER EXTREMITY MMT:  MMT Right eval Left eval Right 09/02/24 Left 09/02/24 Right  10/04/24  Hip flexion 4+ 5 4+ 5 4+ -5-  Hip extension 4 4+ 4+ 5 4+ -5-  Hip abduction (sidelying) 4-  4+ 5 4+  Hip adduction  Hip internal rotation       Hip external rotation       Knee flexion 5 5 5 5    Knee extension 5 5 5 5    Ankle dorsiflexion       Ankle plantarflexion       Ankle inversion       Ankle eversion        (Blank rows = not tested) *= pain/symptoms  FUNCTIONAL TESTS:  5 times sit to stand: 26.97 seconds  Stairs: 7 inch, step too pattern, uses LLE 5xSTS 09/02/24: 17.38 without use of UEs  GAIT: Distance walked: 100 feet  Assistive device utilized: None Level of assistance: Complete Independence Comments: antalgic on RLE with truncal lean to R   TODAY'S TREATMENT:      10/12/24  Nustep L5x8 minutes all four extremities seat 10   Forward step ups 6 inch box holding 15# KB x15 B Lateral step ups 6 inch box holding 15# KB x10 B DLs 84# k87 to top of 6 inch box on horizontal edge Hip hikes x12 B Hip hikes + ABD x10 B Bridges + ABD into green TB x10 Sidelying hip ABD green TB x10  Figure 4 stretch 2x30 stretches  Roller to R HS and lateral thigh   General education on the type of procedure he is having early December, typical PT restrictions after and why, general expected course of care/progression/recovery time      10/07/24  Nustep seat 10 all four extremities L5x8 minutes   Hip flexor stretch off edge of mat table 2x30 seconds Figure four stretch 2x30 seconds  B Hooklying HS 2x30 seconds B with sheet Lumbar rotation stretch 5x5 seconds B SKTC 5x5 seconds B   Roller R HS and lateral thigh     10/04/24 Pt seen for aquatic therapy today.  Treatment took place in water  3.5-4.75 ft in depth at the [facility] pool. Temp of water  was 91.  Pt entered/exited the pool via stairs independently in step-to pattern with bil rail.   Exercise  - Stool Scoots/ rocking - Noodle press - Noodle Stomp neutral hip then ER. 10x slow/ 10x fast - Standing Balance on Noodle at [employer]: df/pf - Squat on Noodle  - Plank on [employer] with Leg Lift   - Flutter Kicking/Windshield Wipers ; sitting on step - Runner's Step Up/Down leading R/L x 10     09/30/24  Nustep L5x8 minutes all four extremities  STS 2x10 with 15# KB switching sides between rounds  Shuttle BLE press 168# 2x12  Shuttle single leg press 125# 2x12 surgical LE  Forward step ups 6 inch box 15# KB x10 B  Figure 4 stretch 2x30 seconds B hooklying Quad and hip flexor stretches 2x30 seconds B  SKTC 5x5 seconds B    Roller R lateral thigh and HS         PATIENT EDUCATION:  Education details: exercise progression/ modification Person educated: Patient Education method: Programmer, Multimedia, Facilities Manager,  Education comprehension: verbalized understanding, returned demonstration, verbal cues required, and tactile cues required  HOME EXERCISE PROGRAM: Access Code: [email or web address] URL: [email or web address] Date: 07/07/2024 Prepared by: Prentice Zaunegger  Exercises - Supine Bridge  - 1 x daily - 7 x weekly - 3 sets - 10 reps - Clamshell (Mirrored)  - 1 x daily - 7 x weekly - 3 sets - 10 reps  Access Code: [email or web address] URL: [email or web address] Date: 09/27/2024 Prepared by: Frankie Ziemba  Exercises - Stool Scoots  - 1 x  daily - 1-3 x weekly - Noodle press  - 1 x daily - 1-3 x weekly - 1-3 sets - 10 reps - Noodle Stomp  - 1 x daily - 1-3 x weekly - 1-2  sets - 10 reps - Standing Balance on Noodle at [employer]  - 1 x daily - 1-3 x weekly - 1-3 reps - 20 hold - Squat on Noodle  - 1 x daily - 1-3 x weekly - 1-3 sets - 10 reps - Plank on Long Hand Float with Leg Lift  - 1 x daily - 1-3 x weekly - 1-2 sets - 10 reps - Flutter Kicking/Windshield Wipers  - 1 x daily - 1-3 x weekly - 3 sets - 10 reps - Runner's Step Up/Down  - 1 x daily - 1-3 x weekly - 1-3 sets - 10 reps - arm swing resisted with hand bells  - 1 x daily - 1-3 x weekly - 3 sets - 5-10 reps ASSESSMENT:  CLINICAL IMPRESSION:   Arrives today doing OK, pain was a little better today so we went back to some more aggressive strengthening as tolerated today, continued with stretches and manual as well. His surgery is coming up and will be done on 10/24/24.    Eval: Patient a 58 y.o. y.o. male who was seen today for physical therapy evaluation and treatment for S/P right hip trochanteric bursectomy and IT band release. Patient presents with pain limited deficits in R hip strength, ROM, endurance, activity tolerance, and functional mobility with ADL. Patient is having to modify and restrict ADL as indicated by outcome measure score as well as subjective information and objective measures which is affecting overall participation. Patient will benefit from skilled physical therapy in order to improve function and reduce impairment.  OBJECTIVE IMPAIRMENTS: Abnormal gait, decreased activity tolerance, decreased balance, decreased endurance, decreased mobility, difficulty walking, decreased ROM, decreased strength, increased muscle spasms, impaired flexibility, improper body mechanics, and pain  ACTIVITY LIMITATIONS: lifting, bending, standing, squatting, stairs, transfers, locomotion level, and caring for others  PARTICIPATION LIMITATIONS: meal prep, cleaning, laundry, shopping, community activity, occupation, and yard work  PERSONAL FACTORS: Fitness and 3+ comorbidities: Hx R THA, R TKA, hx LBP,  HTN are also affecting patient's functional outcome.   REHAB POTENTIAL: Good  CLINICAL DECISION MAKING: Evolving/moderate complexity  EVALUATION COMPLEXITY: Moderate   GOALS: Goals reviewed with patient? Yes  SHORT TERM GOALS: Target date: 08/04/2024    Patient will be independent with HEP in order to improve functional outcomes. Baseline: Goal status: Met 08/03/23  2.  Patient will report at least 25% improvement in symptoms/functional status for improved quality of life. Baseline: Goal status: Met 09/02/24   LONG TERM GOALS: Target date: 09/01/2024  Patient will report at least 75% improvement in symptoms for improved quality of life. Baseline:  Goal status: Progressing 09/02/24 (40%)  2.  Patient will improve LEFS  score by at least 15 points in order to indicate improved tolerance to activity. Baseline:  Goal status: Progressing 09/02/24; progressing 10/04/24  3.  Patient will be able to navigate stairs with reciprocal pattern without compensation in order to demonstrate improved LE strength. Baseline:  Goal status: Progressing 09/02/24  4. Patient will demonstrate grade of 5/5 MMT grade in all tested musculature as evidence of improved strength to assist with stair ambulation and gait. Baseline:  Goal status: Progressing 09/02/24; Partially met (LLE)/ progressing R 10/04/24  5.  Patient will be able to complete 5x STS in under 15 seconds  in order to reduce the risk of falls. Baseline:  Goal status: Progressing 09/02/24  PLAN:  PT FREQUENCY: 2x/week  PT DURATION: 8 weeks  PLANNED INTERVENTIONS: 97164- PT Re-evaluation, 97110-Therapeutic exercises, 97530- Therapeutic activity, 97112- Neuromuscular re-education, 97535- Self Care, [id number]- Manual therapy, [phone]- Gait training, [phone]- Orthotic Fit/training, [phone]- Canalith repositioning, [id number]- Aquatic Therapy, [phone]- Splinting, [phone]- Wound care (first 20 sq cm), 97598- Wound care (each additional 20 sq  cm)Patient/Family education, Balance training, Stair training, Taping, Dry Needling, Joint mobilization, Joint manipulation, Spinal manipulation, Spinal mobilization, Scar mobilization, and DME instructions.  PLAN FOR NEXT SESSION: hip strength, functional strength. Surgery 12/8, re-eval post op 12/12  Josette Rough, PT, DPT 10/12/24 9:30 AM

## 2024-10-17 ENCOUNTER — Encounter (HOSPITAL_BASED_OUTPATIENT_CLINIC_OR_DEPARTMENT_OTHER): Payer: Self-pay | Admitting: Orthopaedic Surgery

## 2024-10-17 ENCOUNTER — Other Ambulatory Visit: Payer: Self-pay

## 2024-10-18 ENCOUNTER — Ambulatory Visit (HOSPITAL_BASED_OUTPATIENT_CLINIC_OR_DEPARTMENT_OTHER): Admitting: Physical Therapy

## 2024-10-18 ENCOUNTER — Encounter (HOSPITAL_BASED_OUTPATIENT_CLINIC_OR_DEPARTMENT_OTHER)
Admission: RE | Admit: 2024-10-18 | Discharge: 2024-10-18 | Disposition: A | Source: Ambulatory Visit | Attending: Orthopaedic Surgery

## 2024-10-18 DIAGNOSIS — Z01810 Encounter for preprocedural cardiovascular examination: Secondary | ICD-10-CM | POA: Diagnosis not present

## 2024-10-18 NOTE — Progress Notes (Signed)

## 2024-10-21 ENCOUNTER — Ambulatory Visit (HOSPITAL_BASED_OUTPATIENT_CLINIC_OR_DEPARTMENT_OTHER): Attending: Orthopedic Surgery | Admitting: Physical Therapy

## 2024-10-21 ENCOUNTER — Encounter (HOSPITAL_BASED_OUTPATIENT_CLINIC_OR_DEPARTMENT_OTHER): Payer: Self-pay | Admitting: Physical Therapy

## 2024-10-21 DIAGNOSIS — R2689 Other abnormalities of gait and mobility: Secondary | ICD-10-CM | POA: Insufficient documentation

## 2024-10-21 DIAGNOSIS — S76011A Strain of muscle, fascia and tendon of right hip, initial encounter: Secondary | ICD-10-CM | POA: Diagnosis present

## 2024-10-21 DIAGNOSIS — M25551 Pain in right hip: Secondary | ICD-10-CM | POA: Insufficient documentation

## 2024-10-21 DIAGNOSIS — R29898 Other symptoms and signs involving the musculoskeletal system: Secondary | ICD-10-CM | POA: Diagnosis present

## 2024-10-21 DIAGNOSIS — M6281 Muscle weakness (generalized): Secondary | ICD-10-CM | POA: Insufficient documentation

## 2024-10-21 NOTE — Therapy (Addendum)
 OUTPATIENT PHYSICAL THERAPY LOWER EXTREMITY TREATMENT    Patient Name: Reginald [name] MRN: [id number] DOB:November 06, 1966, 58 y.o., male Today's Date: 10/21/2024    END OF SESSION:  PT End of Session - 10/21/24 0802     Visit Number 23    Number of Visits 32    Date for Recertification  10/28/24    Authorization Type [employer]    Progress Note Due on Visit 30    PT Start Time 0802    PT Stop Time 0843    PT Time Calculation (min) 41 min    Activity Tolerance Patient tolerated treatment well    Behavior During Therapy [facility] for tasks assessed/performed          Past Medical History:  Diagnosis Date   Arthritis    Gallstones    Hypertension    Past Surgical History:  Procedure Laterality Date   CHOLECYSTECTOMY  12/03/2011   Procedure: LAPAROSCOPIC CHOLECYSTECTOMY WITH INTRAOPERATIVE CHOLANGIOGRAM;  Surgeon: Camellia CHRISTELLA Blush, MD;  Location: [facility] OR;  Service: General;  Laterality: N/A;  laparoscopic cholecystectomy with intraoperative cholangiogram   CHONDROPLASTY Right 09/19/2015   Procedure: CHONDROPLASTY;  Surgeon: Norleen Gavel, MD;  Location: [facility];  Service: Orthopedics;  Laterality: Right;   ENDOVENOUS ABLATION SAPHENOUS VEIN W/ LASER Right 01/13/2019   endovenous laser ablation right greater saphenous vein and stab phlebectomy 10-20 incisions right leg by Lonni Blade MD    IR ABLATE LIVER CRYOABLATION  04/03/2020   IR RADIOLOGIST EVAL & MGMT  03/28/2020   KNEE ARTHROSCOPY WITH LATERAL MENISECTOMY Right 09/19/2015   Procedure: KNEE ARTHROSCOPY WITH PARTIAL LATERAL MENISECTOMY;  Surgeon: Norleen Gavel, MD;  Location: [facility];  Service: Orthopedics;  Laterality: Right;   KNEE ARTHROSCOPY WITH MEDIAL MENISECTOMY Right 09/19/2015   Procedure: KNEE ARTHROSCOPY WITH PARTIAL MEDIAL MENISECTOMY;  Surgeon: Norleen Gavel, MD;  Location: [facility];  Service: Orthopedics;  Laterality: Right;   ROTATOR CUFF REPAIR Left     with revision   spider bite     black widow or brown recluse   TOE FUSION Left    TOTAL HIP ARTHROPLASTY Right 06/15/2023   Procedure: TOTAL HIP ARTHROPLASTY ANTERIOR APPROACH;  Surgeon: Gavel Norleen, MD;  Location: [facility];  Service: Orthopedics;  Laterality: Right;   TOTAL KNEE ARTHROPLASTY Right 03/31/2018   Procedure: RIGHT TOTAL KNEE ARTHROPLASTY;  Surgeon: Gavel Norleen, MD;  Location: [facility];  Service: Orthopedics;  Laterality: Right;   TOTAL KNEE REVISION Right 11/04/2019   Procedure: RIGHT TOTAL KNEE REVISION;  Surgeon: Gavel Norleen, MD;  Location: [facility];  Service: Orthopedics;  Laterality: Right;   TRIGGER FINGER RELEASE Right    Patient Active Problem List   Diagnosis Date Noted   Primary osteoarthritis of right hip 06/14/2023   Meralgia paraesthetica, right 06/10/2023   Spondylosis without myelopathy or radiculopathy, lumbar region 06/10/2023   Painful total knee replacement, right 11/04/2019   Arthrofibrosis of knee joint, right 11/04/2019   S/P revision of total knee, right 11/04/2019   Primary osteoarthritis of right knee 03/31/2018   Hypertension 12/02/2011   Obesity (BMI 30-39.9) 12/02/2011    PCP: [facility]  REFERRING PROVIDER: Gavel Norleen, MD  REFERRING DIAG: S/P right hip trochanteric bursectomy and IT band release  THERAPY DIAG:  Other symptoms and signs involving the musculoskeletal system  Pain in right hip  Other abnormalities of gait and mobility  Muscle weakness (generalized)  Rationale for Evaluation and Treatment: Rehabilitation  ONSET DATE:  3 weeks ago  SUBJECTIVE:   SUBJECTIVE STATEMENT:  Reports he is at about a 5/10 pain in hip today. States it has to do with the weather. Feels a little burning on the outside.   PERTINENT HISTORY: Hx R THA, R TKA, hx LBP, HTN  PAIN:  Are you having pain? Yes: NPRS scale: 5/10 Pain location: R hip, Rt groin and Rt distal lateral knee. L knee and L shoulder  Pain description:  aching, sharp L knee; everywhere else is more an ache, R hip feels like it has a pressure on it  Aggravating factors: not sure  Relieving factors: nothing/time and rest   PRECAUTIONS: None  WEIGHT BEARING RESTRICTIONS: No  FALLS:  Has patient fallen in last 6 months? No  PLOF: Independent  PATIENT GOALS:  a little bit of relief  OBJECTIVE: (objective measures from initial evaluation unless otherwise dated)  MRI 8/25 right hip  IMPRESSION: 1. Right total hip arthroplasty with susceptibility artifact partially obscuring the adjacent soft tissue and osseous structures. No periarticular fluid collection or osteolysis. 2. Large amount of fluid in the right greater trochanteric bursa consistent with bursitis. 3. Mild osteoarthritis of the left hip.  PATIENT SURVEYS:  LEFS  Extreme difficulty/unable (0), Quite a bit of difficulty (1), Moderate difficulty (2), Little difficulty (3), No difficulty (4) Survey date:  07/07/24 09/02/24 10/04/24  Any of your usual work, housework or school activities 1 1   2. Usual hobbies, recreational or sporting activities 1 2   3. Getting into/out of the bath 1 1   4. Walking between rooms 2 2   5. Putting on socks/shoes 1 1   6. Squatting  2 1   7. Lifting an object, like a bag of groceries from the floor 2 2   8. Performing light activities around your home 2 2   9. Performing heavy activities around your home 0 0   10. Getting into/out of a car 2 1   11. Walking 2 blocks 0 1   12. Walking 1 mile 0 0   13. Going up/down 10 stairs (1 flight) 1 1   14. Standing for 1 hour 1 1   15.  sitting for 1 hour 2 2   16. Running on even ground 0 0   17. Running on uneven ground 0 0   18. Making sharp turns while running fast 0 0   19. Hopping  0 0   20. Rolling over in bed 1 1   Score total:  19/80 19/80 23/80      COGNITION: Overall cognitive status: Within functional limits for tasks assessed     SENSATION: WFL  POSTURE: No Significant  postural limitations  PALPATION: TTP R greater Troch   LOWER EXTREMITY ROM:  Active ROM Right eval Left eval  Hip flexion    Hip extension    Hip abduction    Hip adduction    Hip internal rotation    Hip external rotation    Knee flexion    Knee extension    Ankle dorsiflexion    Ankle plantarflexion    Ankle inversion    Ankle eversion     (Blank rows = not tested) *= pain/symptoms  LOWER EXTREMITY MMT:  MMT Right eval Left eval Right 09/02/24 Left 09/02/24 Right  10/04/24  Hip flexion 4+ 5 4+ 5 4+ -5-  Hip extension 4 4+ 4+ 5 4+ -5-  Hip abduction (sidelying) 4-  4+ 5 4+  Hip adduction  Hip internal rotation       Hip external rotation       Knee flexion 5 5 5 5    Knee extension 5 5 5 5    Ankle dorsiflexion       Ankle plantarflexion       Ankle inversion       Ankle eversion        (Blank rows = not tested) *= pain/symptoms  FUNCTIONAL TESTS:  5 times sit to stand: 26.97 seconds  Stairs: 7 inch, step too pattern, uses LLE 5xSTS 09/02/24: 17.38 without use of UEs  GAIT: Distance walked: 100 feet  Assistive device utilized: None Level of assistance: Complete Independence Comments: antalgic on RLE with truncal lean to R   TODAY'S TREATMENT:     10/21/24 NuStep x7 min  STM to right glute med, ITB, and biceps femoris  Hip extension/abduction isometrics  LAQ x10 Education and discussion of post-op protocol and exercises  Mini squats   10/12/24  Nustep L5x8 minutes all four extremities seat 10 Forward step ups 6 inch box holding 15# KB x15 B Lateral step ups 6 inch box holding 15# KB x10 B DLs 84# k87 to top of 6 inch box on horizontal edge Hip hikes x12 B Hip hikes + ABD x10 B Bridges + ABD into green TB x10 Sidelying hip ABD green TB x10  Figure 4 stretch 2x30 stretches  Roller to R HS and lateral thigh   General education on the type of procedure he is having early December, typical PT restrictions after and why, general expected  course of care/progression/recovery time      10/07/24  Nustep seat 10 all four extremities L5x8 minutes   Hip flexor stretch off edge of mat table 2x30 seconds Figure four stretch 2x30 seconds B Hooklying HS 2x30 seconds B with sheet Lumbar rotation stretch 5x5 seconds B SKTC 5x5 seconds B   Roller R HS and lateral thigh     10/04/24 Pt seen for aquatic therapy today.  Treatment took place in water  3.5-4.75 ft in depth at the [facility] pool. Temp of water  was 91.  Pt entered/exited the pool via stairs independently in step-to pattern with bil rail.   Exercise  - Stool Scoots/ rocking - Noodle press - Noodle Stomp neutral hip then ER. 10x slow/ 10x fast - Standing Balance on Noodle at [employer]: df/pf - Squat on Noodle  - Plank on [employer] with Leg Lift   - Flutter Kicking/Windshield Wipers ; sitting on step - Runner's Step Up/Down leading R/L x 10     09/30/24  Nustep L5x8 minutes all four extremities  STS 2x10 with 15# KB switching sides between rounds  Shuttle BLE press 168# 2x12  Shuttle single leg press 125# 2x12 surgical LE  Forward step ups 6 inch box 15# KB x10 B  Figure 4 stretch 2x30 seconds B hooklying Quad and hip flexor stretches 2x30 seconds B  SKTC 5x5 seconds B    Roller R lateral thigh and HS         PATIENT EDUCATION:  Education details: exercise progression/ modification, glute med repair protocol Person educated: Patient Education method: Programmer, Multimedia, Demonstration,  Education comprehension: verbalized understanding, returned demonstration, verbal cues required, and tactile cues required  HOME EXERCISE PROGRAM: Access Code: [email or web address] URL: [email or web address] Date: 07/07/2024 Prepared by: Prentice Zaunegger  Exercises - Supine Bridge  - 1 x daily - 7 x weekly - 3 sets - 10 reps - Clamshell (Mirrored)  -  1 x daily - 7 x weekly - 3 sets - 10 reps  Access Code: [email or web address] URL:  [email or web address] Date: 09/27/2024 Prepared by: Frankie Ziemba  Exercises - Stool Scoots  - 1 x daily - 1-3 x weekly - Noodle press  - 1 x daily - 1-3 x weekly - 1-3 sets - 10 reps - Noodle Stomp  - 1 x daily - 1-3 x weekly - 1-2 sets - 10 reps - Standing Balance on Noodle at [employer]  - 1 x daily - 1-3 x weekly - 1-3 reps - 20 hold - Squat on Noodle  - 1 x daily - 1-3 x weekly - 1-3 sets - 10 reps - Plank on Long Hand Float with Leg Lift  - 1 x daily - 1-3 x weekly - 1-2 sets - 10 reps - Flutter Kicking/Windshield Wipers  - 1 x daily - 1-3 x weekly - 3 sets - 10 reps - Runner's Step Up/Down  - 1 x daily - 1-3 x weekly - 1-3 sets - 10 reps - arm swing resisted with hand bells  - 1 x daily - 1-3 x weekly - 3 sets - 5-10 reps ASSESSMENT:  CLINICAL IMPRESSION: Tolerated therapy well with no significant increase in pain. Utilized manual to decrease hyperactive musculature in right hip. Discussed upcoming post-op appointment and educated on glute med repair protocol. Worked into some exercises that he will be doing post-operatively and educated on precautions. Patient will continue to benefit form therapy to address remaining limitations.    Eval: Patient a 58 y.o. y.o. male who was seen today for physical therapy evaluation and treatment for S/P right hip trochanteric bursectomy and IT band release. Patient presents with pain limited deficits in R hip strength, ROM, endurance, activity tolerance, and functional mobility with ADL. Patient is having to modify and restrict ADL as indicated by outcome measure score as well as subjective information and objective measures which is affecting overall participation. Patient will benefit from skilled physical therapy in order to improve function and reduce impairment.  OBJECTIVE IMPAIRMENTS: Abnormal gait, decreased activity tolerance, decreased balance, decreased endurance, decreased mobility, difficulty walking, decreased ROM,  decreased strength, increased muscle spasms, impaired flexibility, improper body mechanics, and pain  ACTIVITY LIMITATIONS: lifting, bending, standing, squatting, stairs, transfers, locomotion level, and caring for others  PARTICIPATION LIMITATIONS: meal prep, cleaning, laundry, shopping, community activity, occupation, and yard work  PERSONAL FACTORS: Fitness and 3+ comorbidities: Hx R THA, R TKA, hx LBP, HTN are also affecting patient's functional outcome.   REHAB POTENTIAL: Good  CLINICAL DECISION MAKING: Evolving/moderate complexity  EVALUATION COMPLEXITY: Moderate   GOALS: Goals reviewed with patient? Yes  SHORT TERM GOALS: Target date: 08/04/2024    Patient will be independent with HEP in order to improve functional outcomes. Baseline: Goal status: Met 08/03/23  2.  Patient will report at least 25% improvement in symptoms/functional status for improved quality of life. Baseline: Goal status: Met 09/02/24   LONG TERM GOALS: Target date: 09/01/2024  Patient will report at least 75% improvement in symptoms for improved quality of life. Baseline:  Goal status: Progressing 09/02/24 (40%)  2.  Patient will improve LEFS  score by at least 15 points in order to indicate improved tolerance to activity. Baseline:  Goal status: Progressing 09/02/24; progressing 10/04/24  3.  Patient will be able to navigate stairs with reciprocal pattern without compensation in order to demonstrate improved LE strength. Baseline:  Goal status: Progressing 09/02/24  4. Patient will demonstrate grade of  5/5 MMT grade in all tested musculature as evidence of improved strength to assist with stair ambulation and gait. Baseline:  Goal status: Progressing 09/02/24; Partially met (LLE)/ progressing R 10/04/24  5.  Patient will be able to complete 5x STS in under 15 seconds in order to reduce the risk of falls. Baseline:  Goal status: Progressing 09/02/24  PLAN:  PT FREQUENCY: 2x/week  PT  DURATION: 8 weeks  PLANNED INTERVENTIONS: 97164- PT Re-evaluation, 97110-Therapeutic exercises, 97530- Therapeutic activity, 97112- Neuromuscular re-education, 97535- Self Care, 02859- Manual therapy, [phone]- Gait training, [phone]- Orthotic Fit/training, [phone]- Canalith repositioning, [id number]- Aquatic Therapy, [phone]- Splinting, [phone]- Wound care (first 20 sq cm), 97598- Wound care (each additional 20 sq cm)Patient/Family education, Balance training, Stair training, Taping, Dry Needling, Joint mobilization, Joint manipulation, Spinal manipulation, Spinal mobilization, Scar mobilization, and DME instructions.  PLAN FOR NEXT SESSION: hip strength, functional strength. Surgery 12/8, re-eval post op 12/12   Lili Finder, Student-PT 10/21/2024, 8:03 AM  This entire session was performed under direct supervision and direction of a licensed therapist/therapist assistant . I have personally read, edited and approve of the note as written. 9:00 AM, 10/21/24 Prentice CANDIE Stains PT, DPT Physical Therapist at [facility]

## 2024-10-24 ENCOUNTER — Other Ambulatory Visit (HOSPITAL_BASED_OUTPATIENT_CLINIC_OR_DEPARTMENT_OTHER): Payer: Self-pay

## 2024-10-24 ENCOUNTER — Encounter (HOSPITAL_BASED_OUTPATIENT_CLINIC_OR_DEPARTMENT_OTHER): Payer: Self-pay | Admitting: Orthopaedic Surgery

## 2024-10-24 ENCOUNTER — Ambulatory Visit (HOSPITAL_BASED_OUTPATIENT_CLINIC_OR_DEPARTMENT_OTHER): Admitting: Anesthesiology

## 2024-10-24 ENCOUNTER — Ambulatory Visit (HOSPITAL_BASED_OUTPATIENT_CLINIC_OR_DEPARTMENT_OTHER)
Admission: RE | Admit: 2024-10-24 | Discharge: 2024-10-24 | Disposition: A | Attending: Orthopaedic Surgery | Admitting: Orthopaedic Surgery

## 2024-10-24 ENCOUNTER — Ambulatory Visit (HOSPITAL_COMMUNITY)

## 2024-10-24 ENCOUNTER — Other Ambulatory Visit: Payer: Self-pay

## 2024-10-24 ENCOUNTER — Encounter (HOSPITAL_BASED_OUTPATIENT_CLINIC_OR_DEPARTMENT_OTHER): Admission: RE | Disposition: A | Payer: Self-pay | Source: Home / Self Care | Attending: Orthopaedic Surgery

## 2024-10-24 DIAGNOSIS — S76011A Strain of muscle, fascia and tendon of right hip, initial encounter: Secondary | ICD-10-CM | POA: Diagnosis not present

## 2024-10-24 DIAGNOSIS — Z01818 Encounter for other preprocedural examination: Secondary | ICD-10-CM

## 2024-10-24 HISTORY — PX: OPEN SURGICAL REPAIR OF GLUTEAL TENDON: SHX5995

## 2024-10-24 SURGERY — REPAIR, TENDON, GLUTEUS MEDIUS, OPEN
Anesthesia: General | Laterality: Right

## 2024-10-24 MED ORDER — HYDROMORPHONE HCL 1 MG/ML IJ SOLN
INTRAMUSCULAR | Status: AC
  Filled 2024-10-24: qty 0.5

## 2024-10-24 MED ORDER — IBUPROFEN 800 MG PO TABS
800.0000 mg | ORAL_TABLET | Freq: Three times a day (TID) | ORAL | 0 refills | Status: AC
  Filled 2024-10-24: qty 30, 10d supply, fill #0

## 2024-10-24 MED ORDER — GABAPENTIN 300 MG PO CAPS
ORAL_CAPSULE | ORAL | Status: AC
  Filled 2024-10-24: qty 1

## 2024-10-24 MED ORDER — OXYCODONE HCL 5 MG PO TABS: 5.0000 mg | ORAL_TABLET | Freq: Once | ORAL | Status: AC | PRN

## 2024-10-24 MED ORDER — DEXMEDETOMIDINE HCL IN NACL 80 MCG/20ML IV SOLN
INTRAVENOUS | Status: DC | PRN
  Administered 2024-10-24: 12 ug via INTRAVENOUS

## 2024-10-24 MED ORDER — ASPIRIN 325 MG PO TBEC
325.0000 mg | DELAYED_RELEASE_TABLET | Freq: Every day | ORAL | 0 refills | Status: AC
  Filled 2024-10-24: qty 14, 14d supply, fill #0

## 2024-10-24 MED ORDER — OXYCODONE HCL 5 MG/5ML PO SOLN
ORAL | Status: AC
  Filled 2024-10-24: qty 5

## 2024-10-24 MED ORDER — BUPIVACAINE HCL 0.25 % IJ SOLN
INTRAMUSCULAR | Status: DC | PRN
  Administered 2024-10-24: 30 mL

## 2024-10-24 MED ORDER — MIDAZOLAM HCL 5 MG/5ML IJ SOLN
INTRAMUSCULAR | Status: DC | PRN
  Administered 2024-10-24: 2 mg via INTRAVENOUS

## 2024-10-24 MED ORDER — LACTATED RINGERS IV SOLN: INTRAVENOUS | Status: DC

## 2024-10-24 MED ORDER — FENTANYL CITRATE (PF) 100 MCG/2ML IJ SOLN
INTRAMUSCULAR | Status: DC | PRN
  Administered 2024-10-24 (×2): 50 ug via INTRAVENOUS

## 2024-10-24 MED ORDER — LIDOCAINE 2% (20 MG/ML) 5 ML SYRINGE
INTRAMUSCULAR | Status: DC | PRN
  Administered 2024-10-24: 60 mg via INTRAVENOUS

## 2024-10-24 MED ORDER — ACETAMINOPHEN 500 MG PO TABS
1000.0000 mg | ORAL_TABLET | Freq: Once | ORAL | Status: AC
  Administered 2024-10-24: 1000 mg via ORAL

## 2024-10-24 MED ORDER — PHENYLEPHRINE 80 MCG/ML (10ML) SYRINGE FOR IV PUSH (FOR BLOOD PRESSURE SUPPORT)
PREFILLED_SYRINGE | INTRAVENOUS | Status: AC
  Filled 2024-10-24: qty 10

## 2024-10-24 MED ORDER — GLYCOPYRROLATE PF 0.2 MG/ML IJ SOSY
PREFILLED_SYRINGE | INTRAMUSCULAR | Status: DC | PRN
  Administered 2024-10-24: .2 mg via INTRAVENOUS

## 2024-10-24 MED ORDER — TRANEXAMIC ACID-NACL 1000-0.7 MG/100ML-% IV SOLN
INTRAVENOUS | Status: AC
  Filled 2024-10-24: qty 100

## 2024-10-24 MED ORDER — KETAMINE HCL 50 MG/5ML IJ SOSY
PREFILLED_SYRINGE | INTRAMUSCULAR | Status: AC
  Filled 2024-10-24: qty 5

## 2024-10-24 MED ORDER — GLYCOPYRROLATE PF 0.2 MG/ML IJ SOSY
PREFILLED_SYRINGE | INTRAMUSCULAR | Status: AC
  Filled 2024-10-24: qty 1

## 2024-10-24 MED ORDER — LACTATED RINGERS IV SOLN: INTRAVENOUS | Status: DC | PRN

## 2024-10-24 MED ORDER — DEXAMETHASONE SOD PHOSPHATE PF 10 MG/ML IJ SOLN
INTRAMUSCULAR | Status: DC | PRN
  Administered 2024-10-24: 8 mg via INTRAVENOUS

## 2024-10-24 MED ORDER — PHENYLEPHRINE 80 MCG/ML (10ML) SYRINGE FOR IV PUSH (FOR BLOOD PRESSURE SUPPORT)
PREFILLED_SYRINGE | INTRAVENOUS | Status: DC | PRN
  Administered 2024-10-24 (×2): 160 ug via INTRAVENOUS

## 2024-10-24 MED ORDER — ACETAMINOPHEN 500 MG PO TABS
500.0000 mg | ORAL_TABLET | Freq: Three times a day (TID) | ORAL | 0 refills | Status: AC
  Filled 2024-10-24: qty 30, 10d supply, fill #0

## 2024-10-24 MED ORDER — LIDOCAINE 2% (20 MG/ML) 5 ML SYRINGE
INTRAMUSCULAR | Status: AC
  Filled 2024-10-24: qty 5

## 2024-10-24 MED ORDER — CEFAZOLIN SODIUM-DEXTROSE 2-4 GM/100ML-% IV SOLN
INTRAVENOUS | Status: AC
  Filled 2024-10-24: qty 100

## 2024-10-24 MED ORDER — ACETAMINOPHEN 500 MG PO TABS
ORAL_TABLET | ORAL | Status: AC
  Filled 2024-10-24: qty 2

## 2024-10-24 MED ORDER — OXYCODONE HCL 5 MG/5ML PO SOLN
5.0000 mg | Freq: Once | ORAL | Status: AC | PRN
  Administered 2024-10-24: 5 mg via ORAL

## 2024-10-24 MED ORDER — OXYCODONE HCL 5 MG PO TABS
5.0000 mg | ORAL_TABLET | ORAL | 0 refills | Status: DC | PRN
  Filled 2024-10-24: qty 20, 4d supply, fill #0

## 2024-10-24 MED ORDER — PROPOFOL 10 MG/ML IV BOLUS
INTRAVENOUS | Status: DC | PRN
  Administered 2024-10-24: 200 mg via INTRAVENOUS
  Administered 2024-10-24: 100 mg via INTRAVENOUS

## 2024-10-24 MED ORDER — DEXMEDETOMIDINE HCL IN NACL 80 MCG/20ML IV SOLN
INTRAVENOUS | Status: AC
  Filled 2024-10-24: qty 20

## 2024-10-24 MED ORDER — ONDANSETRON HCL 4 MG/2ML IJ SOLN
INTRAMUSCULAR | Status: DC | PRN
  Administered 2024-10-24: 4 mg via INTRAVENOUS

## 2024-10-24 MED ORDER — CEFAZOLIN SODIUM-DEXTROSE 3-4 GM/150ML-% IV SOLN
3.0000 g | INTRAVENOUS | Status: AC
  Administered 2024-10-24: 3 g via INTRAVENOUS

## 2024-10-24 MED ORDER — HYDROMORPHONE HCL 1 MG/ML IJ SOLN
INTRAMUSCULAR | Status: DC | PRN
  Administered 2024-10-24: .5 mg via INTRAVENOUS

## 2024-10-24 MED ORDER — FENTANYL CITRATE (PF) 100 MCG/2ML IJ SOLN
INTRAMUSCULAR | Status: AC
  Filled 2024-10-24: qty 2

## 2024-10-24 MED ORDER — SODIUM CHLORIDE 0.9 % IR SOLN
Status: DC | PRN
  Administered 2024-10-24: 6000 mL

## 2024-10-24 MED ORDER — KETAMINE HCL 50 MG/5ML IJ SOSY
PREFILLED_SYRINGE | INTRAMUSCULAR | Status: DC | PRN
  Administered 2024-10-24: 30 mg via INTRAVENOUS

## 2024-10-24 MED ORDER — ONDANSETRON HCL 4 MG/2ML IJ SOLN
INTRAMUSCULAR | Status: AC
  Filled 2024-10-24: qty 2

## 2024-10-24 MED ORDER — TRANEXAMIC ACID-NACL 1000-0.7 MG/100ML-% IV SOLN: 1000.0000 mg | INTRAVENOUS | Status: DC

## 2024-10-24 MED ORDER — TRANEXAMIC ACID-NACL 1000-0.7 MG/100ML-% IV SOLN
INTRAVENOUS | Status: DC | PRN
  Administered 2024-10-24: 1000 mg via INTRAVENOUS

## 2024-10-24 MED ORDER — FENTANYL CITRATE (PF) 100 MCG/2ML IJ SOLN: 25.0000 ug | INTRAMUSCULAR | Status: DC | PRN

## 2024-10-24 MED ORDER — MIDAZOLAM HCL 2 MG/2ML IJ SOLN
INTRAMUSCULAR | Status: AC
  Filled 2024-10-24: qty 2

## 2024-10-24 MED ORDER — GABAPENTIN 300 MG PO CAPS: 300.0000 mg | ORAL_CAPSULE | Freq: Once | ORAL | Status: DC

## 2024-10-24 MED ORDER — AMISULPRIDE (ANTIEMETIC) 5 MG/2ML IV SOLN: 10.0000 mg | Freq: Once | INTRAVENOUS | Status: DC | PRN

## 2024-10-24 SURGICAL SUPPLY — 57 items
ANCH SUT KNTLS 4.75 ALPHAVENT (Anchor) IMPLANT
ANCHOR SUT 2.3 DBL 2.0 TAPE (Anchor) IMPLANT
BIT DRILL 2.3 (BIT) IMPLANT
BLADE EXCALIBUR 4.0X13 (MISCELLANEOUS) IMPLANT
BLADE SURG 10 STRL SS (BLADE) ×1 IMPLANT
BLADE SURG 15 STRL LF DISP TIS (BLADE) ×1 IMPLANT
CANISTER SUCT 1200ML W/VALVE (MISCELLANEOUS) ×1 IMPLANT
CANNULA TWIST IN 8.25X7CM (CANNULA) IMPLANT
CHLORAPREP W/TINT 26 (MISCELLANEOUS) ×1 IMPLANT
CLSR STERI-STRIP ANTIMIC 1/2X4 (GAUZE/BANDAGES/DRESSINGS) IMPLANT
COOLER ICEMAN CLASSIC (MISCELLANEOUS) ×1 IMPLANT
COVER BACK TABLE 60X90IN (DRAPES) ×1 IMPLANT
COVER MAYO STAND STRL (DRAPES) ×1 IMPLANT
DERMABOND ADVANCED .7 DNX12 (GAUZE/BANDAGES/DRESSINGS) IMPLANT
DRAPE STERI IOBAN 125X83 (DRAPES) ×1 IMPLANT
DRAPE U-SHAPE 47X51 STRL (DRAPES) ×2 IMPLANT
DRSG AQUACEL AG ADV 3.5X 6 (GAUZE/BANDAGES/DRESSINGS) ×1 IMPLANT
DRSG AQUACEL AG ADV 3.5X10 (GAUZE/BANDAGES/DRESSINGS) IMPLANT
DRSG TEGADERM 4X4.75 (GAUZE/BANDAGES/DRESSINGS) IMPLANT
ELECTRODE BLDE 4.0 EZ CLN MEGD (MISCELLANEOUS) IMPLANT
ELECTRODE REM PT RTRN 9FT ADLT (ELECTROSURGICAL) ×1 IMPLANT
FEE RENTAL EQUIP HIP INSTR KIT (INSTRUMENTS) IMPLANT
GAUZE PAD ABD 8X10 STRL (GAUZE/BANDAGES/DRESSINGS) IMPLANT
GAUZE SPONGE 4X4 12PLY STRL LF (GAUZE/BANDAGES/DRESSINGS) IMPLANT
GAUZE XEROFORM 1X8 LF (GAUZE/BANDAGES/DRESSINGS) IMPLANT
GLOVE BIO SURGEON STRL SZ 6 (GLOVE) ×2 IMPLANT
GLOVE BIO SURGEON STRL SZ7.5 (GLOVE) ×2 IMPLANT
GLOVE BIOGEL PI IND STRL 6.5 (GLOVE) ×1 IMPLANT
GLOVE BIOGEL PI IND STRL 8 (GLOVE) ×1 IMPLANT
GOWN STRL REUS W/ TWL LRG LVL3 (GOWN DISPOSABLE) ×2 IMPLANT
GOWN STRL REUS W/TWL XL LVL3 (GOWN DISPOSABLE) ×1 IMPLANT
INSTRUMENT ORTHO TEXT HIP FEM (INSTRUMENTS) IMPLANT
KIT PORTAL ENTRY HIP ACCESS (KITS) IMPLANT
MANIFOLD NEPTUNE II (INSTRUMENTS) ×1 IMPLANT
NDL HD SCORPION MEGA LOADER (NEEDLE) IMPLANT
NDL HYPO 22X1.5 SAFETY MO (MISCELLANEOUS) ×1 IMPLANT
PACK BASIN DAY SURGERY FS (CUSTOM PROCEDURE TRAY) ×1 IMPLANT
PAD COLD SHLDR WRAP-ON (PAD) ×1 IMPLANT
PENCIL SMOKE EVACUATOR (MISCELLANEOUS) ×1 IMPLANT
SLEEVE SCD COMPRESS KNEE MED (STOCKING) ×1 IMPLANT
SOLN 0.9% NACL POUR BTL 1000ML (IV SOLUTION) ×1 IMPLANT
SPIKE FLUID TRANSFER (MISCELLANEOUS) IMPLANT
SPONGE T-LAP 18X18 ~~LOC~~+RFID (SPONGE) ×1 IMPLANT
SUCTION TUBE FRAZIER 10FR DISP (SUCTIONS) ×1 IMPLANT
SUT ETHILON 3 0 PS 1 (SUTURE) IMPLANT
SUT MNCRL AB 3-0 PS2 27 (SUTURE) IMPLANT
SUT VIC AB 0 CT1 27XBRD ANBCTR (SUTURE) ×1 IMPLANT
SUT VIC AB 2-0 CT1 TAPERPNT 27 (SUTURE) ×1 IMPLANT
SYR 20ML LL LF (SYRINGE) ×1 IMPLANT
SYR BULB EAR ULCER 3OZ GRN STR (SYRINGE) ×1 IMPLANT
TAP SURG PUNCH 4.75 ALPHAVENT (ORTHOPEDIC DISPOSABLE SUPPLIES) IMPLANT
TOWEL GREEN STERILE FF (TOWEL DISPOSABLE) ×2 IMPLANT
TUBE CONNECTING 20X1/4 (TUBING) ×1 IMPLANT
TUBING ARTHROSCOPY IRRIG 16FT (MISCELLANEOUS) IMPLANT
UNDERPAD 30X36 HEAVY ABSORB (UNDERPADS AND DIAPERS) IMPLANT
WAND ABLATOR APOLLO I90 (BUR) IMPLANT
YANKAUER SUCT BULB TIP NO VENT (SUCTIONS) ×1 IMPLANT

## 2024-10-24 NOTE — Brief Op Note (Signed)
   Brief Op Note  Date of Surgery: 10/24/2024  Preoperative Diagnosis: RIGHT GLUTEUS INSUFFICENCY  Postoperative Diagnosis: same  Procedure: Procedure(s): REPAIR, TENDON, GLUTEUS MEDIUS, ENDOSCOPIC  Implants: Implant Name Type Inv. Item Serial No. Manufacturer Lot No. LRB No. Used Action  ANCHOR SUT 2.3 DBL 2.0 TAPE - [id number] Anchor ANCHOR SUT 2.3 DBL 2.0 TAPE  STRYKER ENDOSCOPY [id number] Right 1 Implanted  ANCH SUT KNTLS 4.75 ALPHAVENT - [id number] Anchor ANCH SUT KNTLS 4.75 ALPHAVENT  STRYKER ENDOSCOPY [id number] Right 1 Implanted    Surgeons: Surgeon(s): Genelle Standing, MD  Anesthesia: General    Estimated Blood Loss: See anesthesia record  Complications: None  Condition to PACU: Stable  Standing LITTIE Genelle, MD 10/24/2024 1:40 PM

## 2024-10-24 NOTE — Op Note (Signed)
  Date of Surgery: 10/24/2024  INDICATIONS: Mr. Murty is a 58 y.o.-year-old male with right hip gluteus medius tear.  The risk and benefits of the procedure were discussed in detail and documented in the pre-operative evaluation.   PREOPERATIVE DIAGNOSIS: 1. Right hip gluteus medius tear 2. Right hip trochanteric bursitis  POSTOPERATIVE DIAGNOSIS: Same.  PROCEDURE: 1. Right hip gluteus medius tear repair 2. Right hip trochanteric bursectomy  SURGEON: Elspeth LITTIE Parker MD  ASSISTANT: Conley Dawson, ATC  ANESTHESIA:  general  IV FLUIDS AND URINE: See anesthesia record.  ANTIBIOTICS: Ancef   ESTIMATED BLOOD LOSS: 10 mL.  IMPLANTS:  Implant Name Type Inv. Item Serial No. Manufacturer Lot No. LRB No. Used Action  ANCHOR SUT 2.3 DBL 2.0 TAPE - [id number] Anchor ANCHOR SUT 2.3 DBL 2.0 TAPE  STRYKER ENDOSCOPY [id number] Right 1 Implanted  ANCH SUT KNTLS 4.75 ALPHAVENT - [id number] Anchor ANCH SUT KNTLS 4.75 ALPHAVENT  STRYKER ENDOSCOPY [id number] Right 1 Implanted    DRAINS: None  CULTURES: None  COMPLICATIONS: none  OPERATIVE REPORT:  The patient was brought to the operating room, placed supine on the operating table, and bony prominences were padded.  At this time. At this point final timeout was performed.  Antibiotics were given 1 hour prior to skin incision.  At this time fluoroscopy was used to localize the anterior lateral portal.  Distal to anterior lateral portal was then created under fluoroscopic visualization.  15 blade was used to incise the scope trocar was introduced at the level of the trochanter significant fluoroscopy was utilized.  The shaver was brought in the anterior lateral femur.  After discussion of its use to debride the bursa over the gluteus medius.  There was a large tear involving the gluteus medius minimus junction.  At this time the shaver was used to debride the anterior lateral aspect of the trochanter.  A all suture anchor was placed medially.  This was  double loaded.  These were passed sequentially to the tissue and brought into a single lateral row anchor.  The sutures were tensioned with excellent apposition of the tendon.  I was happy with the repair.  30 cc of 0.25% Marcaine  was used to infiltrate the portal sites.  Fluid was evacuated.  All counts were correct at the end the case.  The wound was closed with 3-0 nylon and a dressing was placed with gauze and Tegaderm.   POSTOPERATIVE PLAN:    Weight bearing as tolerated operative extremity Formal physical therapy will begin immediately within the first weeks of surgery ASA 325 Daily for DVT prophylaxis      Elspeth LITTIE Parker, MD 1:41 PM

## 2024-10-24 NOTE — Anesthesia Preprocedure Evaluation (Addendum)
 Anesthesia Evaluation  Patient identified by MRN, date of birth, ID band Patient awake    Reviewed: Allergy & Precautions, NPO status , Patient's Chart, lab work & pertinent test results  Airway Mallampati: II  TM Distance: >3 FB Neck ROM: Full    Dental no notable dental hx.    Pulmonary neg pulmonary ROS   Pulmonary exam normal        Cardiovascular hypertension, Pt. on medications Normal cardiovascular exam     Neuro/Psych negative neurological ROS  negative psych ROS   GI/Hepatic negative GI ROS, Neg liver ROS,,,  Endo/Other  negative endocrine ROS    Renal/GU negative Renal ROS     Musculoskeletal   Abdominal  (+) + obese  Peds  Hematology negative hematology ROS (+)   Anesthesia Other Findings RIGHT GLUTEUS INSUFFICENCY  Reproductive/Obstetrics                              Anesthesia Physical Anesthesia Plan  ASA: 2  Anesthesia Plan: General   Post-op Pain Management:    Induction: Intravenous  PONV Risk Score and Plan: 2 and Ondansetron , Dexamethasone , Midazolam  and Treatment may vary due to age or medical condition  Airway Management Planned: Oral ETT  Additional Equipment:   Intra-op Plan:   Post-operative Plan: Extubation in OR  Informed Consent: I have reviewed the patients History and Physical, chart, labs and discussed the procedure including the risks, benefits and alternatives for the proposed anesthesia with the patient or authorized representative who has indicated his/her understanding and acceptance.     Dental advisory given  Plan Discussed with: CRNA  Anesthesia Plan Comments:          Anesthesia Quick Evaluation

## 2024-10-24 NOTE — Discharge Instructions (Addendum)
 Discharge Instructions    Attending Surgeon: Elspeth Parker, MD Office Phone Number: [phone]   Diagnosis and Procedures:    Surgeries Performed: Right hip gluteus medius repair  Discharge Plan:    Diet: Resume usual diet. Begin with light or bland foods.  Drink plenty of fluids.  Activity:  Weight bearing as tolerated right leg. You are advised to go home directly from the hospital or surgical center. Restrict your activities.  GENERAL INSTRUCTIONS: 1.  Please apply ice to your wound to help with swelling and inflammation. This will improve your comfort and your overall recovery following surgery.     2. Please call Dr. Danetta office at [phone] with questions Monday-Friday during business hours. If no one answers, please leave a message and someone should get back to the patient within 24 hours. For emergencies please call 911 or proceed to the emergency room.   3. Patient to notify surgical team if experiences any of the following: Bowel/Bladder dysfunction, uncontrolled pain, nerve/muscle weakness, incision with increased drainage or redness, nausea/vomiting and Fever greater than 101.0 F.  Be alert for signs of infection including redness, streaking, odor, fever or chills. Be alert for excessive pain or bleeding and notify your surgeon immediately.  WOUND INSTRUCTIONS:   Leave your dressing, cast, or splint in place until your post operative visit.  Keep it clean and dry.  Always keep the incision clean and dry until the staples/sutures are removed. If there is no drainage from the incision you should keep it open to air. If there is drainage from the incision you must keep it covered at all times until the drainage stops  Do not soak in a bath tub, hot tub, pool, lake or other body of water  until 21 days after your surgery and your incision is completely dry and healed.  If you have removable sutures (or staples) they must be removed 10-14 days (unless  otherwise instructed) from the day of your surgery.     1)  Elevate the extremity as much as possible.  2)  Keep the dressing clean and dry.  3)  Please call us  if the dressing becomes wet or dirty.  4)  If you are experiencing worsening pain or worsening swelling, please call.     MEDICATIONS: Resume all previous home medications at the previous prescribed dose and frequency unless otherwise noted Start taking the  pain medications on an as-needed basis as prescribed  Please taper down pain medication over the next week following surgery.  Ideally you should not require a refill of any narcotic pain medication.  Take pain medication with food to minimize nausea. In addition to the prescribed pain medication, you may take over-the-counter pain relievers such as Tylenol .  Do NOT take additional tylenol  if your pain medication already has tylenol  in it.  Aspirin  325mg  daily per instructions on bottle. Narcotic policy: Per [facility] clinic policy, our goal is ensure optimal postoperative pain control with a multimodal pain management strategy. For all [facility] patients, our goal is to wean post-operative narcotic medications by 6 weeks post-operatively, and many times sooner. If this is not possible due to utilization of pain medication prior to surgery, your [facility] doctor will support your acute post-operative pain control for the first 6 weeks postoperatively, with a plan to transition you back to your primary pain team following that. Mar[facility]e will work to ensure a therapist, occupational.       FOLLOWUP INSTRUCTIONS: 1. Follow up at the Physical  Therapy Clinic 3-4 days following surgery. This appointment should be scheduled unless other arrangements have been made.The Physical Therapy scheduling number is [phone] if an appointment has not already been arranged.  2. Contact Dr. Danetta office during office hours at [phone] or the practice after hours line at [phone] for  non-emergencies. For medical emergencies call 911.   Discharge Location: Home   No Tylenol  until 3:30pm today, if needed

## 2024-10-24 NOTE — H&P (Signed)
 Expand All Collapse All       Chief Complaint: Right hip pain        History of Present Illness:    08/26/2024: Presents today for MRI discussion of the right hip.  He is still been having pain about the lateral aspect of the hip.   Reginald [name] is a 58 y.o. male presents with ongoing right hip pain.  He is status post right total hip arthroplasty in 2024 with Dr. Yvone as well as a right IT band release 6 weeks prior.  He has been experiencing pain in the groin as well as the lateral trochanter since the hip replacement.  He is on oxycodone  at baseline.  He experiences the pain predominantly deep in the groin with any activity.       PMH/PSH/Family History/Social History/Meds/Allergies:         Past Medical History:  Diagnosis Date   Arthritis     Gallstones     Hypertension               Past Surgical History:  Procedure Laterality Date   CHOLECYSTECTOMY   12/03/2011    Procedure: LAPAROSCOPIC CHOLECYSTECTOMY WITH INTRAOPERATIVE CHOLANGIOGRAM;  Surgeon: Reginald [name];  Location: [facility] OR;  Service: General;  Laterality: N/A;  laparoscopic cholecystectomy with intraoperative cholangiogram   CHONDROPLASTY Right 09/19/2015    Procedure: CHONDROPLASTY;  Surgeon: Reginald [name];  Location: [facility];  Service: Orthopedics;  Laterality: Right;   ENDOVENOUS ABLATION SAPHENOUS VEIN W/ LASER Right 01/13/2019    endovenous laser ablation right greater saphenous vein and stab phlebectomy 10-20 incisions right leg by Lonni Blade [name]    IR ABLATE LIVER CRYOABLATION   04/03/2020   IR RADIOLOGIST EVAL & MGMT   03/28/2020   KNEE ARTHROSCOPY WITH LATERAL MENISECTOMY Right 09/19/2015    Procedure: KNEE ARTHROSCOPY WITH PARTIAL LATERAL MENISECTOMY;  Surgeon: Reginald [name];  Location: [facility];  Service: Orthopedics;  Laterality: Right;   KNEE ARTHROSCOPY WITH MEDIAL MENISECTOMY Right 09/19/2015    Procedure: KNEE ARTHROSCOPY WITH PARTIAL MEDIAL  MENISECTOMY;  Surgeon: Reginald [name];  Location: [facility];  Service: Orthopedics;  Laterality: Right;   ROTATOR CUFF REPAIR Left      with revision   spider bite        black widow or brown recluse   TOE FUSION Left     TOTAL HIP ARTHROPLASTY Right 06/15/2023    Procedure: TOTAL HIP ARTHROPLASTY ANTERIOR APPROACH;  Surgeon: Reginald [name];  Location: [facility];  Service: Orthopedics;  Laterality: Right;   TOTAL KNEE ARTHROPLASTY Right 03/31/2018    Procedure: RIGHT TOTAL KNEE ARTHROPLASTY;  Surgeon: Reginald [name];  Location: [facility];  Service: Orthopedics;  Laterality: Right;   TOTAL KNEE REVISION Right 11/04/2019    Procedure: RIGHT TOTAL KNEE REVISION;  Surgeon: Reginald [name];  Location: [facility];  Service: Orthopedics;  Laterality: Right;   TRIGGER FINGER RELEASE Right          Social History         Socioeconomic History   Marital status: Married      Spouse name: Not on file   Number of children: Not on file   Years of education: Not on file   Highest education level: Not on file  Occupational History   Not on file  Tobacco Use   Smoking status: Never   Smokeless tobacco: Never  Vaping Use   Vaping status: Never  Used  Substance and Sexual Activity   Alcohol use: Yes      Alcohol/week: 1.0 standard drink of alcohol      Types: 1 Cans of beer per week      Comment: occa.   Drug use: No   Sexual activity: Not Currently  Other Topics Concern   Not on file  Social History Narrative   Not on file    Social Drivers of Health        Financial Resource Strain: Not on file  Food Insecurity: Not on file  Transportation Needs: Not on file  Physical Activity: Not on file  Stress: Not on file  Social Connections: Unknown (03/31/2022)    Received from [facility]    Social Network     Social Network: Not on file         Family History  Problem Relation Age of Onset   Cancer Mother          breast   Heart disease Mother          Allergies        Allergies  Allergen Reactions   Gabapentin         Head cloudy    Ibuprofen  Nausea And Vomiting      High doses make him vomit.            Current Outpatient Medications  Medication Sig Dispense Refill   celecoxib  (CELEBREX ) 200 MG capsule Take 1 capsule (200 mg total) by mouth 2 (two) times daily. 60 capsule 2   aspirin  EC 325 MG tablet Take 1 tablet (325 mg total) by mouth 2 (two) times daily after a meal. Take x 1 month post op to decrease risk of blood clots. 60 tablet 0   docusate sodium  (COLACE) 100 MG capsule Take 1 capsule (100 mg total) by mouth 2 (two) times daily. 30 capsule 0   folic acid (FOLVITE) 1 MG tablet Take 1 mg by mouth daily.       HYDROcodone -acetaminophen  (NORCO) 10-325 MG tablet Take 1 tablet by mouth 3 (three) times daily.       LORazepam  (ATIVAN ) 1 MG tablet Take 1 tablet (1 mg total) by mouth every 8 (eight) hours. 2 tablet 0   losartan  (COZAAR ) 100 MG tablet Take 100 mg by mouth daily.       Menthol, Topical Analgesic, (BIOFREEZE EX) Apply 1 Application topically daily as needed (pain).       oxyCODONE -acetaminophen  (PERCOCET/ROXICET) 5-325 MG tablet Take 1-2 tablets by mouth every 6 (six) hours as needed for severe pain. 30 tablet 0   tiZANidine  (ZANAFLEX ) 2 MG tablet Take 1 tablet (2 mg total) by mouth every 8 (eight) hours as needed for muscle spasms. 40 tablet 0   VITAMIN D PO Take 1 capsule by mouth daily.          No current facility-administered medications for this visit.      Imaging Results (Last 48 hours)  No results found.     Review of Systems:   A ROS was performed including pertinent positives and negatives as documented in the HPI.   Physical Exam :   Constitutional: NAD and appears stated age Neurological: Alert and oriented Psych: Appropriate affect and cooperative There were no vitals taken for this visit.    Comprehensive Musculoskeletal Exam:     Tenderness about the femoral acetabular joint particular with a resisted  extension position.  There is also tenderness about the lateral incision of his  IT band as well as the trochanter.  Remainder of distal neurosensory exam is intact walks with a mildly antalgic gait.  Range of motion is 30 degrees internal/external rotation of the right hip with good abduction strength     Imaging:   Xray (3 views right hip): Status post right total hip arthroplasty without evidence of complication   MRI right hip: There is evidence of an undersurface gluteus medius and minimus tear     I personally reviewed and interpreted the radiographs.     Assessment and Plan:   58 y.o. male with evidence of right hip iliopsoas tendinitis which is improved following iliopsoas injection.  At this time I did discuss that he does have evidence of an undersurface tear of the gluteus medius and minimus.  He has tried physical therapy following surgery.  Given the fact that he has not improved we did discuss additional treatment options.  I do ultimately believe he would be a candidate for arthroscopic gluteus medius repair.  I did discuss risks and limitations as well as associated recovery timeframe.  After discussion he would like to proceed   - Plan for right hip endoscopic gluteus medius repair     After a lengthy discussion of treatment options, including risks, benefits, alternatives, complications of surgical and nonsurgical conservative options, the patient elected surgical repair.    The patient  is aware of the material risks  and complications including, but not limited to injury to adjacent structures, neurovascular injury, infection, numbness, bleeding, implant failure, thermal burns, stiffness, persistent pain, failure to heal, disease transmission from allograft, need for further surgery, dislocation, anesthetic risks, blood clots, risks of death,and others. The probabilities of surgical success and failure discussed with patient given their particular co-morbidities.The time and  nature of expected rehabilitation and recovery was discussed.The patient's questions were all answered preoperatively.  No barriers to understanding were noted. I explained the natural history of the disease process and Rx rationale.  I explained to the patient what I considered to be reasonable expectations given their personal situation.  The final treatment plan was arrived at through a shared patient decision making process model.       I personally saw and evaluated the patient, and participated in the management and treatment plan.   Elspeth Parker, [name] Attending Physician, Orthopedic Surgery   This document was dictated using Dragon voice recognition software. A reasonable attempt at proof reading has been made to minimize errors.

## 2024-10-24 NOTE — Anesthesia Procedure Notes (Signed)
 Procedure Name: LMA Insertion Date/Time: 10/24/2024 12:41 PM  Performed by: Denton Niels CROME, CRNAPre-anesthesia Checklist: Patient identified, Emergency Drugs available, Suction available and Patient being monitored Patient Re-evaluated:Patient Re-evaluated prior to induction Oxygen Delivery Method: Circle system utilized Preoxygenation: Pre-oxygenation with 100% oxygen Induction Type: IV induction Ventilation: Mask ventilation without difficulty Number of attempts: 1 Placement Confirmation: positive ETCO2 and breath sounds checked- equal and bilateral

## 2024-10-24 NOTE — Transfer of Care (Signed)
 Immediate Anesthesia Transfer of Care Note  Patient: Reginald [name]  Procedure(s) Performed: REPAIR, TENDON, GLUTEUS MEDIUS, ENDOSCOPIC (Right)  Patient Location: PACU  Anesthesia Type:General  Level of Consciousness: awake, alert , oriented, and patient cooperative  Airway & Oxygen Therapy: Patient Spontanous Breathing and Patient connected to face mask oxygen  Post-op Assessment: Report given to RN and Post -op Vital signs reviewed and stable  Post vital signs: Reviewed and stable  Last Vitals:  Vitals Value Taken Time  BP 142/89 10/24/24 13:41  Temp    Pulse 85 10/24/24 13:44  Resp 13 10/24/24 13:44  SpO2 98 % 10/24/24 13:44  Vitals shown include unfiled device data.  Last Pain:  Vitals:   10/24/24 0928  TempSrc: Temporal  PainSc: 5       Patients Stated Pain Goal: 5 (10/24/24 9071)  Complications: No notable events documented.

## 2024-10-24 NOTE — Interval H&P Note (Signed)
 History and Physical Interval Note:  10/24/2024 11:45 AM  Reginald [name]  has presented today for surgery, with the diagnosis of RIGHT GLUTEUS INSUFFICENCY.  The various methods of treatment have been discussed with the patient and family. After consideration of risks, benefits and other options for treatment, the patient has consented to  Procedure(s) with comments: REPAIR, TENDON, GLUTEUS MEDIUS, OPEN (Right) - RIGHT HIP ENDOSCOPIC GLUTEUS MEDIUS REPAIR as a surgical intervention.  The patient's history has been reviewed, patient examined, no change in status, stable for surgery.  I have reviewed the patient's chart and labs.  Questions were answered to the patient's satisfaction.     Camay Pedigo

## 2024-10-25 ENCOUNTER — Encounter (HOSPITAL_BASED_OUTPATIENT_CLINIC_OR_DEPARTMENT_OTHER): Payer: Self-pay | Admitting: Orthopaedic Surgery

## 2024-10-25 ENCOUNTER — Ambulatory Visit (HOSPITAL_BASED_OUTPATIENT_CLINIC_OR_DEPARTMENT_OTHER): Admitting: Physical Therapy

## 2024-10-25 NOTE — Anesthesia Postprocedure Evaluation (Signed)
 Anesthesia Post Note  Patient: JAHDIEL KROL  Procedure(s) Performed: REPAIR, TENDON, GLUTEUS MEDIUS, ENDOSCOPIC (Right)     Patient location during evaluation: PACU Anesthesia Type: General Level of consciousness: awake Pain management: pain level controlled Vital Signs Assessment: post-procedure vital signs reviewed and stable Respiratory status: spontaneous breathing, nonlabored ventilation and respiratory function stable Cardiovascular status: blood pressure returned to baseline and stable Postop Assessment: no apparent nausea or vomiting Anesthetic complications: no   No notable events documented.  Last Vitals:  Vitals:   10/24/24 1415 10/24/24 1431  BP: (!) 141/90 (!) 166/89  Pulse: 72 67  Resp: 13 18  Temp:  (!) 36.4 C  SpO2: 96% 96%    Last Pain:  Vitals:   10/24/24 1431  TempSrc: Tympanic  PainSc: 4                  Joron Velis P [place]

## 2024-10-27 ENCOUNTER — Other Ambulatory Visit (HOSPITAL_BASED_OUTPATIENT_CLINIC_OR_DEPARTMENT_OTHER): Payer: Self-pay

## 2024-10-27 ENCOUNTER — Other Ambulatory Visit (HOSPITAL_BASED_OUTPATIENT_CLINIC_OR_DEPARTMENT_OTHER): Payer: Self-pay | Admitting: Orthopaedic Surgery

## 2024-10-27 MED ORDER — OXYCODONE HCL 5 MG PO TABS
5.0000 mg | ORAL_TABLET | ORAL | 0 refills | Status: AC | PRN
  Filled 2024-10-27: qty 20, 4d supply, fill #0

## 2024-10-28 ENCOUNTER — Other Ambulatory Visit (HOSPITAL_BASED_OUTPATIENT_CLINIC_OR_DEPARTMENT_OTHER): Payer: Self-pay | Admitting: Orthopaedic Surgery

## 2024-10-28 ENCOUNTER — Ambulatory Visit (HOSPITAL_BASED_OUTPATIENT_CLINIC_OR_DEPARTMENT_OTHER): Admitting: Physical Therapy

## 2024-10-28 ENCOUNTER — Encounter (HOSPITAL_BASED_OUTPATIENT_CLINIC_OR_DEPARTMENT_OTHER): Payer: Self-pay | Admitting: Physical Therapy

## 2024-10-28 DIAGNOSIS — R29898 Other symptoms and signs involving the musculoskeletal system: Secondary | ICD-10-CM

## 2024-10-28 DIAGNOSIS — R2689 Other abnormalities of gait and mobility: Secondary | ICD-10-CM

## 2024-10-28 DIAGNOSIS — M6281 Muscle weakness (generalized): Secondary | ICD-10-CM

## 2024-10-28 DIAGNOSIS — S76011A Strain of muscle, fascia and tendon of right hip, initial encounter: Secondary | ICD-10-CM

## 2024-10-28 DIAGNOSIS — M25551 Pain in right hip: Secondary | ICD-10-CM

## 2024-10-28 NOTE — Therapy (Signed)
 OUTPATIENT PHYSICAL THERAPY LOWER EXTREMITY Re- EVAL    Patient Name: Reginald [name] MRN: [id number] DOB:December 22, 1965, 58 y.o., male Today's Date: 10/28/2024    END OF SESSION:  PT End of Session - 10/28/24 0848     Visit Number 24    Number of Visits 56    Date for Recertification  01/20/25    Authorization Type [employer]    Progress Note Due on Visit 34    PT Start Time 0847    PT Stop Time 0919    PT Time Calculation (min) 32 min    Activity Tolerance Patient tolerated treatment well    Behavior During Therapy [facility] for tasks assessed/performed          Past Medical History:  Diagnosis Date   Arthritis    Gallstones    Hypertension    Past Surgical History:  Procedure Laterality Date   CHOLECYSTECTOMY  12/03/2011   Procedure: LAPAROSCOPIC CHOLECYSTECTOMY WITH INTRAOPERATIVE CHOLANGIOGRAM;  Surgeon: Camellia CHRISTELLA Blush, MD;  Location: [facility] OR;  Service: General;  Laterality: N/A;  laparoscopic cholecystectomy with intraoperative cholangiogram   CHONDROPLASTY Right 09/19/2015   Procedure: CHONDROPLASTY;  Surgeon: Norleen Gavel, MD;  Location: [facility];  Service: Orthopedics;  Laterality: Right;   ENDOVENOUS ABLATION SAPHENOUS VEIN W/ LASER Right 01/13/2019   endovenous laser ablation right greater saphenous vein and stab phlebectomy 10-20 incisions right leg by Lonni Blade MD    IR ABLATE LIVER CRYOABLATION  04/03/2020   IR RADIOLOGIST EVAL & MGMT  03/28/2020   KNEE ARTHROSCOPY WITH LATERAL MENISECTOMY Right 09/19/2015   Procedure: KNEE ARTHROSCOPY WITH PARTIAL LATERAL MENISECTOMY;  Surgeon: Norleen Gavel, MD;  Location: [facility];  Service: Orthopedics;  Laterality: Right;   KNEE ARTHROSCOPY WITH MEDIAL MENISECTOMY Right 09/19/2015   Procedure: KNEE ARTHROSCOPY WITH PARTIAL MEDIAL MENISECTOMY;  Surgeon: Norleen Gavel, MD;  Location: [facility];  Service: Orthopedics;  Laterality: Right;   OPEN SURGICAL REPAIR OF  GLUTEAL TENDON Right 10/24/2024   Procedure: REPAIR, TENDON, GLUTEUS MEDIUS, ENDOSCOPIC;  Surgeon: Genelle Standing, MD;  Location:  SURGERY CENTER;  Service: Orthopedics;  Laterality: Right;  RIGHT HIP ENDOSCOPIC GLUTEUS MEDIUS REPAIR   ROTATOR CUFF REPAIR Left    with revision   spider bite     black widow or brown recluse   TOE FUSION Left    TOTAL HIP ARTHROPLASTY Right 06/15/2023   Procedure: TOTAL HIP ARTHROPLASTY ANTERIOR APPROACH;  Surgeon: Gavel Norleen, MD;  Location: [facility];  Service: Orthopedics;  Laterality: Right;   TOTAL KNEE ARTHROPLASTY Right 03/31/2018   Procedure: RIGHT TOTAL KNEE ARTHROPLASTY;  Surgeon: Gavel Norleen, MD;  Location: [facility];  Service: Orthopedics;  Laterality: Right;   TOTAL KNEE REVISION Right 11/04/2019   Procedure: RIGHT TOTAL KNEE REVISION;  Surgeon: Gavel Norleen, MD;  Location: [facility];  Service: Orthopedics;  Laterality: Right;   TRIGGER FINGER RELEASE Right    Patient Active Problem List   Diagnosis Date Noted   Tear of right gluteus medius tendon 10/24/2024   Primary osteoarthritis of right hip 06/14/2023   Meralgia paraesthetica, right 06/10/2023   Spondylosis without myelopathy or radiculopathy, lumbar region 06/10/2023   Painful total knee replacement, right 11/04/2019   Arthrofibrosis of knee joint, right 11/04/2019   S/P revision of total knee, right 11/04/2019   Primary osteoarthritis of right knee 03/31/2018   Hypertension 12/02/2011   Obesity (BMI 30-39.9) 12/02/2011    PCP: [facility]  REFERRING PROVIDER: Gavel,  Norleen, MD/ glute repair Genelle Standing, MD  REFERRING DIAG: S/P right hip trochanteric bursectomy and IT band release 10/28/24 - S76.011A (ICD-10-CM) - Tear of right gluteus medius tendon, initial encounter  PROCEDURE: 1. Right hip gluteus medius tear repair 2. Right hip trochanteric bursectomy DOS 10/24/24  THERAPY DIAG:  Other symptoms and signs involving the musculoskeletal system  Pain  in right hip  Other abnormalities of gait and mobility  Muscle weakness (generalized)  Rationale for Evaluation and Treatment: Rehabilitation  ONSET DATE: 3 weeks ago; Glute repair 10/24/24  SUBJECTIVE:   SUBJECTIVE STATEMENT:  Doing alright since surgery. Hasn't been using AD much. Some soreness.   PERTINENT HISTORY: Hx R THA, R TKA, hx LBP, HTN,   PAIN:  Are you having pain? Yes: NPRS scale: 6/10 Pain location: R hip  Pain description: aching, sharp L knee; everywhere else is more an ache, R hip feels like it has a pressure on it  Aggravating factors: not sure  Relieving factors: nothing/time and rest   PRECAUTIONS: None  WEIGHT BEARING RESTRICTIONS: No  FALLS:  Has patient fallen in last 6 months? No  PLOF: Independent  PATIENT GOALS:  a little bit of relief  OBJECTIVE: (objective measures from initial evaluation unless otherwise dated) OBSERVATION:  10/28/24: sutures intact, no s/s of infection   MRI 8/25 right hip  IMPRESSION: 1. Right total hip arthroplasty with susceptibility artifact partially obscuring the adjacent soft tissue and osseous structures. No periarticular fluid collection or osteolysis. 2. Large amount of fluid in the right greater trochanteric bursa consistent with bursitis. 3. Mild osteoarthritis of the left hip.  PATIENT SURVEYS:  LEFS  Extreme difficulty/unable (0), Quite a bit of difficulty (1), Moderate difficulty (2), Little difficulty (3), No difficulty (4) Survey date:  07/07/24 09/02/24 10/04/24 10/28/24  Any of your usual work, housework or school activities 1 1  2   2. Usual hobbies, recreational or sporting activities 1 2  1   3. Getting into/out of the bath 1 1  1   4. Walking between rooms 2 2  2   5. Putting on socks/shoes 1 1  1   6. Squatting  2 1  1   7. Lifting an object, like a bag of groceries from the floor 2 2  1   8. Performing light activities around your home 2 2  1   9. Performing heavy activities around your home 0  0  0  10. Getting into/out of a car 2 1  1   11. Walking 2 blocks 0 1  1  12. Walking 1 mile 0 0  0  13. Going up/down 10 stairs (1 flight) 1 1  1   14. Standing for 1 hour 1 1  1   15.  sitting for 1 hour 2 2  2   16. Running on even ground 0 0  0  17. Running on uneven ground 0 0  0  18. Making sharp turns while running fast 0 0  0  19. Hopping  0 0  0  20. Rolling over in bed 1 1  1   Score total:  19/80 19/80 23/80  17/80     COGNITION: Overall cognitive status: Within functional limits for tasks assessed     SENSATION: WFL  POSTURE: No Significant postural limitations  PALPATION: TTP R greater Troch   LOWER EXTREMITY ROM: 10/28/24: WFL for tasks assessed  Active ROM Right eval Left eval Right 10/28/24 Left 10/28/24  Hip flexion   90   Hip extension  Hip abduction      Hip adduction      Hip internal rotation      Hip external rotation      Knee flexion      Knee extension      Ankle dorsiflexion      Ankle plantarflexion      Ankle inversion      Ankle eversion       (Blank rows = not tested) *= pain/symptoms  LOWER EXTREMITY MMT: 10/28/24: not fully assessed due to post op status  MMT Right eval Left eval Right 09/02/24 Left 09/02/24 Right  10/04/24 Right 10/28/24 Left 10/28/24  Hip flexion 4+ 5 4+ 5 4+ -5- 4+ 4+  Hip extension 4 4+ 4+ 5 4+ -5-    Hip abduction (sidelying) 4-  4+ 5 4+    Hip adduction         Hip internal rotation         Hip external rotation         Knee flexion 5 5 5 5  5 5   Knee extension 5 5 5 5  5 5   Ankle dorsiflexion         Ankle plantarflexion         Ankle inversion         Ankle eversion          (Blank rows = not tested) *= pain/symptoms  FUNCTIONAL TESTS:  5 times sit to stand: 26.97 seconds  Stairs: 7 inch, step too pattern, uses LLE 5xSTS 09/02/24: 17.38 without use of Ues  10/28/24: not fully assessed due to post op status  GAIT: Distance walked: 100 feet  Assistive device utilized: None Level of  assistance: Complete Independence Comments: antalgic on RLE with truncal lean to R  GAIT: 10/28/24 Distance walked: 100 feet  Assistive device utilized: None Level of assistance: Complete Independence Comments: antalgic on RLE with truncal lean to R  TODAY'S TREATMENT:     10/28/24 Reassessment Dressing change, education Glute sets 10 x 5-10 second holds Hip adduction isometric 10 x 5-10 second holds Prone Hamstring curl 1 x 10 LAQ    10/21/24 NuStep x7 min  STM to right glute med, ITB, and biceps femoris  Hip extension/abduction isometrics  LAQ x10 Education and discussion of post-op protocol and exercises  Mini squats   10/12/24  Nustep L5x8 minutes all four extremities seat 10 Forward step ups 6 inch box holding 15# KB x15 B Lateral step ups 6 inch box holding 15# KB x10 B DLs 84# k87 to top of 6 inch box on horizontal edge Hip hikes x12 B Hip hikes + ABD x10 B Bridges + ABD into green TB x10 Sidelying hip ABD green TB x10  Figure 4 stretch 2x30 stretches  Roller to R HS and lateral thigh   General education on the type of procedure he is having early December, typical PT restrictions after and why, general expected course of care/progression/recovery time      10/07/24  Nustep seat 10 all four extremities L5x8 minutes   Hip flexor stretch off edge of mat table 2x30 seconds Figure four stretch 2x30 seconds B Hooklying HS 2x30 seconds B with sheet Lumbar rotation stretch 5x5 seconds B SKTC 5x5 seconds B   Roller R HS and lateral thigh    PATIENT EDUCATION:  Education details: exercise progression/ modification, glute med repair protocol, reassessment findings, POC, use of SPC Person educated: Patient Education method: Explanation, Demonstration,  Education comprehension:  verbalized understanding, returned demonstration, verbal cues required, and tactile cues required  HOME EXERCISE PROGRAM: POST OP GLUTE Access Code: [email or web address] URL:  [email or web address] Date: 10/28/2024 Prepared by: Prentice Liyla Radliff  Exercises - Hooklying Gluteal Sets  - 3 x daily - 7 x weekly - 10 reps - 5-10 second hold - Supine Hip Adduction Isometric with Ball  - 1 x daily - 7 x weekly - 10 reps - 5-10 second hold - Prone Knee Flexion (Mirrored)  - 3 x daily - 7 x weekly - 3 sets - 10 reps - Abdominal Bracing  - 3 x daily - 7 x weekly - 10 reps - 5-10 second hold - Seated Long Arc Quad  - 3 x daily - 7 x weekly - 2 sets - 10 reps - 5-10 second hold   Access Code: [email or web address] URL: [email or web address] Date: 07/07/2024 Prepared by: Prentice Natalyah Cummiskey  Exercises - Supine Bridge  - 1 x daily - 7 x weekly - 3 sets - 10 reps - Clamshell (Mirrored)  - 1 x daily - 7 x weekly - 3 sets - 10 reps  Access Code: [email or web address] URL: [email or web address] Date: 09/27/2024 Prepared by: Frankie Ziemba  Exercises - Stool Scoots  - 1 x daily - 1-3 x weekly - Noodle press  - 1 x daily - 1-3 x weekly - 1-3 sets - 10 reps - Noodle Stomp  - 1 x daily - 1-3 x weekly - 1-2 sets - 10 reps - Standing Balance on Noodle at [employer]  - 1 x daily - 1-3 x weekly - 1-3 reps - 20 hold - Squat on Noodle  - 1 x daily - 1-3 x weekly - 1-3 sets - 10 reps - Plank on Long Hand Float with Leg Lift  - 1 x daily - 1-3 x weekly - 1-2 sets - 10 reps - Flutter Kicking/Windshield Wipers  - 1 x daily - 1-3 x weekly - 3 sets - 10 reps - Runner's Step Up/Down  - 1 x daily - 1-3 x weekly - 1-3 sets - 10 reps - arm swing resisted with hand bells  - 1 x daily - 1-3 x weekly - 3 sets - 5-10 reps ASSESSMENT:  CLINICAL IMPRESSION: Patient a 58 y.o. y.o. male who was seen today for physical therapy evaluation and treatment for Right hip gluteus medius tear repair DOS 10/24/24. Patient presents with pain limited deficits in R hip strength, ROM, endurance, activity tolerance, gait balance, and functional mobility with ADL. Patient is having to modify and  restrict ADL as indicated by outcome measure score as well as subjective information and objective measures which is affecting overall participation. Extending POC 1-2x/week for 12 weeks as patient has had surgery on 10/24/24. New goal added for glute stability, other goals remain pertinent. Patient will benefit from skilled physical therapy in order to improve function and reduce impairment.     OBJECTIVE IMPAIRMENTS: Abnormal gait, decreased activity tolerance, decreased balance, decreased endurance, decreased mobility, difficulty walking, decreased ROM, decreased strength, increased muscle spasms, impaired flexibility, improper body mechanics, and pain  ACTIVITY LIMITATIONS: lifting, bending, standing, squatting, stairs, transfers, locomotion level, and caring for others  PARTICIPATION LIMITATIONS: meal prep, cleaning, laundry, shopping, community activity, occupation, and yard work  PERSONAL FACTORS: Fitness and 3+ comorbidities: Hx R THA, R TKA, hx LBP, HTN are also affecting patient's functional outcome.   REHAB POTENTIAL: Good  CLINICAL DECISION MAKING: Evolving/moderate complexity  EVALUATION COMPLEXITY: Moderate   GOALS: Goals reviewed with patient? Yes  SHORT TERM GOALS: Target date: 08/04/2024    Patient will be independent with HEP in order to improve functional outcomes. Baseline: Goal status: Met 08/03/23  2.  Patient will report at least 25% improvement in symptoms/functional status for improved quality of life. Baseline: Goal status: Met 09/02/24   LONG TERM GOALS: Target date: 09/01/2024  Patient will report at least 75% improvement in symptoms for improved quality of life. Baseline:  Goal status: Progressing 09/02/24 (40%)  2.  Patient will improve LEFS  score by at least 15 points in order to indicate improved tolerance to activity. Baseline:  Goal status: Progressing 09/02/24; progressing 10/04/24  3.  Patient will be able to navigate stairs with  reciprocal pattern without compensation in order to demonstrate improved LE strength. Baseline:  Goal status: Progressing 09/02/24  4. Patient will demonstrate grade of 5/5 MMT grade in all tested musculature as evidence of improved strength to assist with stair ambulation and gait. Baseline:  Goal status: Progressing 09/02/24; Partially met (LLE)/ progressing R 10/04/24  5.  Patient will be able to complete 5x STS in under 15 seconds in order to reduce the risk of falls. Baseline:  Goal status: Progressing 09/02/24  6.   SLS 30s without compensation for improved gait Baseline:  Goal status: New PLAN:  PT FREQUENCY: 1-2x/week  PT DURATION: 12 weeks  PLANNED INTERVENTIONS: [id number]- PT Re-evaluation, 97110-Therapeutic exercises, 97530- Therapeutic activity, [id number]- Neuromuscular re-education, 97535- Self Care, [id number]- Manual therapy, [id number]- Gait training, [phone]- Orthotic Fit/training, [phone]- Canalith repositioning, [id number]- Aquatic Therapy, [phone]- Splinting, [phone]- Wound care (first 20 sq cm), 97598- Wound care (each additional 20 sq cm)Patient/Family education, Balance training, Stair training, Taping, Dry Needling, Joint mobilization, Joint manipulation, Spinal manipulation, Spinal mobilization, Scar mobilization, and DME instructions.  PLAN FOR NEXT SESSION: progress with hip gluteus medius tear repair   Prentice GORMAN Stains, PT, DPT 10/28/2024, 9:21 AM

## 2024-11-01 ENCOUNTER — Ambulatory Visit (HOSPITAL_BASED_OUTPATIENT_CLINIC_OR_DEPARTMENT_OTHER): Admitting: Physical Therapy

## 2024-11-04 ENCOUNTER — Ambulatory Visit (HOSPITAL_BASED_OUTPATIENT_CLINIC_OR_DEPARTMENT_OTHER): Admitting: Physical Therapy

## 2024-11-04 ENCOUNTER — Encounter (HOSPITAL_BASED_OUTPATIENT_CLINIC_OR_DEPARTMENT_OTHER): Payer: Self-pay | Admitting: Physical Therapy

## 2024-11-04 ENCOUNTER — Ambulatory Visit (INDEPENDENT_AMBULATORY_CARE_PROVIDER_SITE_OTHER): Admitting: Orthopaedic Surgery

## 2024-11-04 DIAGNOSIS — R29898 Other symptoms and signs involving the musculoskeletal system: Secondary | ICD-10-CM | POA: Diagnosis not present

## 2024-11-04 DIAGNOSIS — M25551 Pain in right hip: Secondary | ICD-10-CM

## 2024-11-04 DIAGNOSIS — M6281 Muscle weakness (generalized): Secondary | ICD-10-CM

## 2024-11-04 DIAGNOSIS — R2689 Other abnormalities of gait and mobility: Secondary | ICD-10-CM

## 2024-11-04 DIAGNOSIS — S76011A Strain of muscle, fascia and tendon of right hip, initial encounter: Secondary | ICD-10-CM

## 2024-11-04 NOTE — Progress Notes (Signed)
 "                                Post Operative Evaluation    Procedure/Date of Surgery: Right hip endoscopic gluteus medius repair 12/8  Interval History:     Presents 2 weeks status post the above procedure.  Overall he is doing extremely well.  He is walking with full weight.  Occasionally getting some tendinitis in the front of the hip   PMH/PSH/Family History/Social History/Meds/Allergies:    Past Medical History:  Diagnosis Date   Arthritis    Gallstones    Hypertension    Past Surgical History:  Procedure Laterality Date   CHOLECYSTECTOMY  12/03/2011   Procedure: LAPAROSCOPIC CHOLECYSTECTOMY WITH INTRAOPERATIVE CHOLANGIOGRAM;  Surgeon: Camellia CHRISTELLA Blush, MD;  Location: [facility] OR;  Service: General;  Laterality: N/A;  laparoscopic cholecystectomy with intraoperative cholangiogram   CHONDROPLASTY Right 09/19/2015   Procedure: CHONDROPLASTY;  Surgeon: Norleen Gavel, MD;  Location: [facility];  Service: Orthopedics;  Laterality: Right;   ENDOVENOUS ABLATION SAPHENOUS VEIN W/ LASER Right 01/13/2019   endovenous laser ablation right greater saphenous vein and stab phlebectomy 10-20 incisions right leg by Lonni Blade MD    IR ABLATE LIVER CRYOABLATION  04/03/2020   IR RADIOLOGIST EVAL & MGMT  03/28/2020   KNEE ARTHROSCOPY WITH LATERAL MENISECTOMY Right 09/19/2015   Procedure: KNEE ARTHROSCOPY WITH PARTIAL LATERAL MENISECTOMY;  Surgeon: Norleen Gavel, MD;  Location: [facility];  Service: Orthopedics;  Laterality: Right;   KNEE ARTHROSCOPY WITH MEDIAL MENISECTOMY Right 09/19/2015   Procedure: KNEE ARTHROSCOPY WITH PARTIAL MEDIAL MENISECTOMY;  Surgeon: Norleen Gavel, MD;  Location: [facility];  Service: Orthopedics;  Laterality: Right;   OPEN SURGICAL REPAIR OF GLUTEAL TENDON Right 10/24/2024   Procedure: REPAIR, TENDON, GLUTEUS MEDIUS, ENDOSCOPIC;  Surgeon: Genelle Standing, MD;  Location: [facility];  Service: Orthopedics;   Laterality: Right;  RIGHT HIP ENDOSCOPIC GLUTEUS MEDIUS REPAIR   ROTATOR CUFF REPAIR Left    with revision   spider bite     black widow or brown recluse   TOE FUSION Left    TOTAL HIP ARTHROPLASTY Right 06/15/2023   Procedure: TOTAL HIP ARTHROPLASTY ANTERIOR APPROACH;  Surgeon: Gavel Norleen, MD;  Location: [facility];  Service: Orthopedics;  Laterality: Right;   TOTAL KNEE ARTHROPLASTY Right 03/31/2018   Procedure: RIGHT TOTAL KNEE ARTHROPLASTY;  Surgeon: Gavel Norleen, MD;  Location: [facility];  Service: Orthopedics;  Laterality: Right;   TOTAL KNEE REVISION Right 11/04/2019   Procedure: RIGHT TOTAL KNEE REVISION;  Surgeon: Gavel Norleen, MD;  Location: [facility];  Service: Orthopedics;  Laterality: Right;   TRIGGER FINGER RELEASE Right    Social History   Socioeconomic History   Marital status: Married    Spouse name: Not on file   Number of children: Not on file   Years of education: Not on file   Highest education level: Not on file  Occupational History   Not on file  Tobacco Use   Smoking status: Never   Smokeless tobacco: Never  Vaping Use   Vaping status: Never Used  Substance and Sexual Activity   Alcohol use: Yes    Alcohol/week: 1.0 standard drink of alcohol    Types: 1 Cans of beer per week    Comment: occa.   Drug use: No   Sexual activity: Not Currently  Other Topics Concern   Not on file  Social History Narrative   Not on file   Social Drivers of Health   Tobacco Use: Low Risk (11/04/2024)   Patient History    Smoking Tobacco Use: Never    Smokeless Tobacco Use: Never    Passive Exposure: Not on file  Financial Resource Strain: Not on file  Food Insecurity: Not on file  Transportation Needs: Not on file  Physical Activity: Not on file  Stress: Not on file  Social Connections: Not on file  Depression (EYV7-0): Not on file  Alcohol Screen: Not on file  Housing: Not on file  Utilities: Not on file  Health Literacy: Not on file   Family History  Problem  Relation Age of Onset   Cancer Mother        breast   Heart disease Mother    Allergies[1] Current Outpatient Medications  Medication Sig Dispense Refill   aspirin  EC 325 MG tablet Take 1 tablet (325 mg total) by mouth daily. 14 tablet 0   celecoxib  (CELEBREX ) 200 MG capsule Take 1 capsule (200 mg total) by mouth 2 (two) times daily. 60 capsule 2   docusate sodium  (COLACE) 100 MG capsule Take 1 capsule (100 mg total) by mouth 2 (two) times daily. 30 capsule 0   folic acid (FOLVITE) 1 MG tablet Take 1 mg by mouth daily.     HYDROcodone -acetaminophen  (NORCO) 10-325 MG tablet Take 1 tablet by mouth 3 (three) times daily.     LORazepam  (ATIVAN ) 1 MG tablet Take 1 tablet (1 mg total) by mouth every 8 (eight) hours. 2 tablet 0   losartan  (COZAAR ) 100 MG tablet Take 100 mg by mouth daily.     Menthol, Topical Analgesic, (BIOFREEZE EX) Apply 1 Application topically daily as needed (pain).     oxyCODONE  (ROXICODONE ) 5 MG immediate release tablet Take 1 tablet (5 mg total) by mouth every 4 (four) hours as needed for severe pain (pain score 7-10) or breakthrough pain. 20 tablet 0   oxyCODONE -acetaminophen  (PERCOCET/ROXICET) 5-325 MG tablet Take 1-2 tablets by mouth every 6 (six) hours as needed for severe pain. 30 tablet 0   tiZANidine  (ZANAFLEX ) 2 MG tablet Take 1 tablet (2 mg total) by mouth every 8 (eight) hours as needed for muscle spasms. 40 tablet 0   VITAMIN D PO Take 1 capsule by mouth daily.     No current facility-administered medications for this visit.   No results found.  Review of Systems:   A ROS was performed including pertinent positives and negatives as documented in the HPI.   Musculoskeletal Exam:    There were no vitals taken for this visit.  Right hip incisions are well-appearing without erythema or drainage.  30 degrees internal/external rotation of the hip are without pain walks with a normal gait  Imaging:      I personally reviewed and interpreted the  radiographs.   Assessment:   2 weeks status post right hip endoscopic gluteus medius repair doing extremely well.  At this time we will continue to follow the gluteus medius repair protocol and I will plan to see him back in 4 weeks for reassessment  Plan :    - Return to clinic 4 weeks for reassessment      I personally saw and evaluated the patient, and participated in the management and treatment plan.  Elspeth Parker, MD Attending Physician, Orthopedic Surgery  This document was dictated using Dragon voice recognition software. A reasonable attempt at proof reading has been made to minimize  errors.    [1]  Allergies Allergen Reactions   Gabapentin      Head cloudy    Ibuprofen  Nausea And Vomiting    High doses make him vomit.   "

## 2024-11-04 NOTE — Therapy (Signed)
 " OUTPATIENT PHYSICAL THERAPY TREATMENT    Patient Name: Reginald [name] MRN: [id number] DOB:25-Sep-1966, 58 y.o., male Today's Date: 11/04/2024    END OF SESSION:  PT End of Session - 11/04/24 1015     Visit Number 25    Number of Visits 56    Date for Recertification  01/20/25    Authorization Type [employer]    Progress Note Due on Visit 34    PT Start Time 1015    PT Stop Time 1055    PT Time Calculation (min) 40 min    Activity Tolerance Patient tolerated treatment well    Behavior During Therapy WFL for tasks assessed/performed          Past Medical History:  Diagnosis Date   Arthritis    Gallstones    Hypertension    Past Surgical History:  Procedure Laterality Date   CHOLECYSTECTOMY  12/03/2011   Procedure: LAPAROSCOPIC CHOLECYSTECTOMY WITH INTRAOPERATIVE CHOLANGIOGRAM;  Surgeon: Camellia CHRISTELLA Blush, MD;  Location: [facility] OR;  Service: General;  Laterality: N/A;  laparoscopic cholecystectomy with intraoperative cholangiogram   CHONDROPLASTY Right 09/19/2015   Procedure: CHONDROPLASTY;  Surgeon: Norleen Gavel, MD;  Location: [facility];  Service: Orthopedics;  Laterality: Right;   ENDOVENOUS ABLATION SAPHENOUS VEIN W/ LASER Right 01/13/2019   endovenous laser ablation right greater saphenous vein and stab phlebectomy 10-20 incisions right leg by Lonni Blade MD    IR ABLATE LIVER CRYOABLATION  04/03/2020   IR RADIOLOGIST EVAL & MGMT  03/28/2020   KNEE ARTHROSCOPY WITH LATERAL MENISECTOMY Right 09/19/2015   Procedure: KNEE ARTHROSCOPY WITH PARTIAL LATERAL MENISECTOMY;  Surgeon: Norleen Gavel, MD;  Location: [facility];  Service: Orthopedics;  Laterality: Right;   KNEE ARTHROSCOPY WITH MEDIAL MENISECTOMY Right 09/19/2015   Procedure: KNEE ARTHROSCOPY WITH PARTIAL MEDIAL MENISECTOMY;  Surgeon: Norleen Gavel, MD;  Location: [facility];  Service: Orthopedics;  Laterality: Right;   OPEN SURGICAL REPAIR OF GLUTEAL TENDON  Right 10/24/2024   Procedure: REPAIR, TENDON, GLUTEUS MEDIUS, ENDOSCOPIC;  Surgeon: Genelle Standing, MD;  Location: [facility];  Service: Orthopedics;  Laterality: Right;  RIGHT HIP ENDOSCOPIC GLUTEUS MEDIUS REPAIR   ROTATOR CUFF REPAIR Left    with revision   spider bite     black widow or brown recluse   TOE FUSION Left    TOTAL HIP ARTHROPLASTY Right 06/15/2023   Procedure: TOTAL HIP ARTHROPLASTY ANTERIOR APPROACH;  Surgeon: Gavel Norleen, MD;  Location: [facility];  Service: Orthopedics;  Laterality: Right;   TOTAL KNEE ARTHROPLASTY Right 03/31/2018   Procedure: RIGHT TOTAL KNEE ARTHROPLASTY;  Surgeon: Gavel Norleen, MD;  Location: [facility];  Service: Orthopedics;  Laterality: Right;   TOTAL KNEE REVISION Right 11/04/2019   Procedure: RIGHT TOTAL KNEE REVISION;  Surgeon: Gavel Norleen, MD;  Location: [facility];  Service: Orthopedics;  Laterality: Right;   TRIGGER FINGER RELEASE Right    Patient Active Problem List   Diagnosis Date Noted   Tear of right gluteus medius tendon 10/24/2024   Primary osteoarthritis of right hip 06/14/2023   Meralgia paraesthetica, right 06/10/2023   Spondylosis without myelopathy or radiculopathy, lumbar region 06/10/2023   Painful total knee replacement, right 11/04/2019   Arthrofibrosis of knee joint, right 11/04/2019   S/P revision of total knee, right 11/04/2019   Primary osteoarthritis of right knee 03/31/2018   Hypertension 12/02/2011   Obesity (BMI 30-39.9) 12/02/2011    PCP: [facility]  REFERRING PROVIDER: Gavel Norleen, MD/  glute repair Genelle Standing, MD  REFERRING DIAG: S/P right hip trochanteric bursectomy and IT band release 10/28/24 - S76.011A (ICD-10-CM) - Tear of right gluteus medius tendon, initial encounter  PROCEDURE: 1. Right hip gluteus medius tear repair 2. Right hip trochanteric bursectomy DOS 10/24/24  THERAPY DIAG:  Other symptoms and signs involving the musculoskeletal system  Pain in right  hip  Other abnormalities of gait and mobility  Muscle weakness (generalized)  Rationale for Evaluation and Treatment: Rehabilitation  ONSET DATE: 3 weeks ago; Glute repair 10/24/24  SUBJECTIVE:   SUBJECTIVE STATEMENT:  Patient states doing well, dressing intact, sees MD today.  PERTINENT HISTORY: Hx R THA, R TKA, hx LBP, HTN,   PAIN:  Are you having pain? Yes: NPRS scale: 6/10 Pain location: R hip  Pain description: aching, sharp L knee; everywhere else is more an ache, R hip feels like it has a pressure on it  Aggravating factors: not sure  Relieving factors: nothing/time and rest   PRECAUTIONS: None  WEIGHT BEARING RESTRICTIONS: No  FALLS:  Has patient fallen in last 6 months? No  PLOF: Independent  PATIENT GOALS:  a little bit of relief  OBJECTIVE: (objective measures from initial evaluation unless otherwise dated) OBSERVATION:  10/28/24: sutures intact, no s/s of infection   MRI 8/25 right hip  IMPRESSION: 1. Right total hip arthroplasty with susceptibility artifact partially obscuring the adjacent soft tissue and osseous structures. No periarticular fluid collection or osteolysis. 2. Large amount of fluid in the right greater trochanteric bursa consistent with bursitis. 3. Mild osteoarthritis of the left hip.  PATIENT SURVEYS:  LEFS  Extreme difficulty/unable (0), Quite a bit of difficulty (1), Moderate difficulty (2), Little difficulty (3), No difficulty (4) Survey date:  07/07/24 09/02/24 10/04/24 10/28/24  Any of your usual work, housework or school activities 1 1  2   2. Usual hobbies, recreational or sporting activities 1 2  1   3. Getting into/out of the bath 1 1  1   4. Walking between rooms 2 2  2   5. Putting on socks/shoes 1 1  1   6. Squatting  2 1  1   7. Lifting an object, like a bag of groceries from the floor 2 2  1   8. Performing light activities around your home 2 2  1   9. Performing heavy activities around your home 0 0  0  10. Getting  into/out of a car 2 1  1   11. Walking 2 blocks 0 1  1  12. Walking 1 mile 0 0  0  13. Going up/down 10 stairs (1 flight) 1 1  1   14. Standing for 1 hour 1 1  1   15.  sitting for 1 hour 2 2  2   16. Running on even ground 0 0  0  17. Running on uneven ground 0 0  0  18. Making sharp turns while running fast 0 0  0  19. Hopping  0 0  0  20. Rolling over in bed 1 1  1   Score total:  19/80 19/80 23/80  17/80     COGNITION: Overall cognitive status: Within functional limits for tasks assessed     SENSATION: WFL  POSTURE: No Significant postural limitations  PALPATION: TTP R greater Troch   LOWER EXTREMITY ROM: 10/28/24: WFL for tasks assessed  Active ROM Right eval Left eval Right 10/28/24 Left 10/28/24  Hip flexion   90   Hip extension      Hip abduction  Hip adduction      Hip internal rotation      Hip external rotation      Knee flexion      Knee extension      Ankle dorsiflexion      Ankle plantarflexion      Ankle inversion      Ankle eversion       (Blank rows = not tested) *= pain/symptoms  LOWER EXTREMITY MMT: 10/28/24: not fully assessed due to post op status  MMT Right eval Left eval Right 09/02/24 Left 09/02/24 Right  10/04/24 Right 10/28/24 Left 10/28/24  Hip flexion 4+ 5 4+ 5 4+ -5- 4+ 4+  Hip extension 4 4+ 4+ 5 4+ -5-    Hip abduction (sidelying) 4-  4+ 5 4+    Hip adduction         Hip internal rotation         Hip external rotation         Knee flexion 5 5 5 5  5 5   Knee extension 5 5 5 5  5 5   Ankle dorsiflexion         Ankle plantarflexion         Ankle inversion         Ankle eversion          (Blank rows = not tested) *= pain/symptoms  FUNCTIONAL TESTS:  5 times sit to stand: 26.97 seconds  Stairs: 7 inch, step too pattern, uses LLE 5xSTS 09/02/24: 17.38 without use of Ues  10/28/24: not fully assessed due to post op status  GAIT: Distance walked: 100 feet  Assistive device utilized: None Level of assistance: Complete  Independence Comments: antalgic on RLE with truncal lean to R  GAIT: 10/28/24 Distance walked: 100 feet  Assistive device utilized: None Level of assistance: Complete Independence Comments: antalgic on RLE with truncal lean to R  TODAY'S TREATMENT:     11/04/24 Nustep level 5, 5 minutes for dynamic warm up Hip adduction isometric 10 x 5-10 second holds TA activation with small bent knee fall out 2 x 10 SAQ 2# 2 x 10 with 5 second holds Prone glute sets 2 x 10 with 5-10 second holds Prone hamstring curl 5# 2 x 10 LAQ 5# 2 x 10   10/28/24 Reassessment Dressing change, education Glute sets 10 x 5-10 second holds Hip adduction isometric 10 x 5-10 second holds Prone Hamstring curl 1 x 10 LAQ    10/21/24 NuStep x7 min  STM to right glute med, ITB, and biceps femoris  Hip extension/abduction isometrics  LAQ x10 Education and discussion of post-op protocol and exercises  Mini squats   10/12/24  Nustep L5x8 minutes all four extremities seat 10 Forward step ups 6 inch box holding 15# KB x15 B Lateral step ups 6 inch box holding 15# KB x10 B DLs 84# k87 to top of 6 inch box on horizontal edge Hip hikes x12 B Hip hikes + ABD x10 B Bridges + ABD into green TB x10 Sidelying hip ABD green TB x10  Figure 4 stretch 2x30 stretches  Roller to R HS and lateral thigh   General education on the type of procedure he is having early December, typical PT restrictions after and why, general expected course of care/progression/recovery time      10/07/24  Nustep seat 10 all four extremities L5x8 minutes   Hip flexor stretch off edge of mat table 2x30 seconds Figure four stretch 2x30 seconds B Hooklying  HS 2x30 seconds B with sheet Lumbar rotation stretch 5x5 seconds B SKTC 5x5 seconds B   Roller R HS and lateral thigh    PATIENT EDUCATION:  Education details: exercise progression/ modification, glute med repair protocol, reassessment findings, POC, use of SPC Person educated:  Patient Education method: Explanation, Demonstration,  Education comprehension: verbalized understanding, returned demonstration, verbal cues required, and tactile cues required  HOME EXERCISE PROGRAM: POST OP GLUTE Access Code: [email or web address] URL: [email or web address] Date: 10/28/2024 Prepared by: Prentice Ilena Dieckman  Exercises - Hooklying Gluteal Sets  - 3 x daily - 7 x weekly - 10 reps - 5-10 second hold - Supine Hip Adduction Isometric with Ball  - 1 x daily - 7 x weekly - 10 reps - 5-10 second hold - Prone Knee Flexion (Mirrored)  - 3 x daily - 7 x weekly - 3 sets - 10 reps - Abdominal Bracing  - 3 x daily - 7 x weekly - 10 reps - 5-10 second hold - Seated Long Arc Quad  - 3 x daily - 7 x weekly - 2 sets - 10 reps - 5-10 second hold   Access Code: [email or web address] URL: [email or web address] Date: 07/07/2024 Prepared by: Prentice Rayma Hegg  Exercises - Supine Bridge  - 1 x daily - 7 x weekly - 3 sets - 10 reps - Clamshell (Mirrored)  - 1 x daily - 7 x weekly - 3 sets - 10 reps  Access Code: [email or web address] URL: [email or web address] Date: 09/27/2024 Prepared by: Frankie Ziemba  Exercises - Stool Scoots  - 1 x daily - 1-3 x weekly - Noodle press  - 1 x daily - 1-3 x weekly - 1-3 sets - 10 reps - Noodle Stomp  - 1 x daily - 1-3 x weekly - 1-2 sets - 10 reps - Standing Balance on Noodle at [employer]  - 1 x daily - 1-3 x weekly - 1-3 reps - 20 hold - Squat on Noodle  - 1 x daily - 1-3 x weekly - 1-3 sets - 10 reps - Plank on Long Hand Float with Leg Lift  - 1 x daily - 1-3 x weekly - 1-2 sets - 10 reps - Flutter Kicking/Windshield Wipers  - 1 x daily - 1-3 x weekly - 3 sets - 10 reps - Runner's Step Up/Down  - 1 x daily - 1-3 x weekly - 1-3 sets - 10 reps - arm swing resisted with hand bells  - 1 x daily - 1-3 x weekly - 3 sets - 5-10 reps ASSESSMENT:  CLINICAL IMPRESSION: Began session on nustep for dynamic warm up. Continued with progression through  glute repair protocol. Patient overall doing very well. Added weight to previously completed exercises with minimal challenge reported, will need to advance next session. Patient will continue to benefit from physical therapy in order to improve function and reduce impairment.     OBJECTIVE IMPAIRMENTS: Abnormal gait, decreased activity tolerance, decreased balance, decreased endurance, decreased mobility, difficulty walking, decreased ROM, decreased strength, increased muscle spasms, impaired flexibility, improper body mechanics, and pain  ACTIVITY LIMITATIONS: lifting, bending, standing, squatting, stairs, transfers, locomotion level, and caring for others  PARTICIPATION LIMITATIONS: meal prep, cleaning, laundry, shopping, community activity, occupation, and yard work  PERSONAL FACTORS: Fitness and 3+ comorbidities: Hx R THA, R TKA, hx LBP, HTN are also affecting patient's functional outcome.   REHAB POTENTIAL: Good  CLINICAL DECISION MAKING: Evolving/moderate complexity  EVALUATION COMPLEXITY: Moderate   GOALS: Goals reviewed with patient? Yes  SHORT TERM GOALS: Target  date: 08/04/2024    Patient will be independent with HEP in order to improve functional outcomes. Baseline: Goal status: Met 08/03/23  2.  Patient will report at least 25% improvement in symptoms/functional status for improved quality of life. Baseline: Goal status: Met 09/02/24   LONG TERM GOALS: Target date: 09/01/2024  Patient will report at least 75% improvement in symptoms for improved quality of life. Baseline:  Goal status: Progressing 09/02/24 (40%)  2.  Patient will improve LEFS  score by at least 15 points in order to indicate improved tolerance to activity. Baseline:  Goal status: Progressing 09/02/24; progressing 10/04/24  3.  Patient will be able to navigate stairs with reciprocal pattern without compensation in order to demonstrate improved LE strength. Baseline:  Goal status: Progressing  09/02/24  4. Patient will demonstrate grade of 5/5 MMT grade in all tested musculature as evidence of improved strength to assist with stair ambulation and gait. Baseline:  Goal status: Progressing 09/02/24; Partially met (LLE)/ progressing R 10/04/24  5.  Patient will be able to complete 5x STS in under 15 seconds in order to reduce the risk of falls. Baseline:  Goal status: Progressing 09/02/24  6.   SLS 30s without compensation for improved gait Baseline:  Goal status: New PLAN:  PT FREQUENCY: 1-2x/week  PT DURATION: 12 weeks  PLANNED INTERVENTIONS: [id number]- PT Re-evaluation, 97110-Therapeutic exercises, 97530- Therapeutic activity, [id number]- Neuromuscular re-education, 97535- Self Care, [id number]- Manual therapy, [id number]- Gait training, [phone]- Orthotic Fit/training, [phone]- Canalith repositioning, [id number]- Aquatic Therapy, [phone]- Splinting, [phone]- Wound care (first 20 sq cm), 97598- Wound care (each additional 20 sq cm)Patient/Family education, Balance training, Stair training, Taping, Dry Needling, Joint mobilization, Joint manipulation, Spinal manipulation, Spinal mobilization, Scar mobilization, and DME instructions.  PLAN FOR NEXT SESSION: progress with hip gluteus medius tear repair   Prentice GORMAN Stains, PT, DPT 11/04/2024, 10:55 AM     "

## 2024-11-08 ENCOUNTER — Encounter (HOSPITAL_BASED_OUTPATIENT_CLINIC_OR_DEPARTMENT_OTHER): Admitting: Physical Therapy

## 2024-11-08 ENCOUNTER — Encounter (HOSPITAL_BASED_OUTPATIENT_CLINIC_OR_DEPARTMENT_OTHER): Payer: Self-pay | Admitting: Physical Therapy

## 2024-11-08 DIAGNOSIS — R29898 Other symptoms and signs involving the musculoskeletal system: Secondary | ICD-10-CM

## 2024-11-08 DIAGNOSIS — M6281 Muscle weakness (generalized): Secondary | ICD-10-CM

## 2024-11-08 DIAGNOSIS — M25551 Pain in right hip: Secondary | ICD-10-CM

## 2024-11-08 DIAGNOSIS — R2689 Other abnormalities of gait and mobility: Secondary | ICD-10-CM

## 2024-11-08 NOTE — Therapy (Signed)
 " OUTPATIENT PHYSICAL THERAPY TREATMENT    Patient Name: Reginald [name] MRN: [id number] DOB:09-Jun-1966, 58 y.o., male Today's Date: 11/08/2024    END OF SESSION:  PT End of Session - 11/08/24 1107     Visit Number 26    Number of Visits 56    Date for Recertification  01/20/25    Authorization Type [employer]    Progress Note Due on Visit 34    PT Start Time 1105    PT Stop Time 1145    PT Time Calculation (min) 40 min    Activity Tolerance Patient tolerated treatment well    Behavior During Therapy WFL for tasks assessed/performed          Past Medical History:  Diagnosis Date   Arthritis    Gallstones    Hypertension    Past Surgical History:  Procedure Laterality Date   CHOLECYSTECTOMY  12/03/2011   Procedure: LAPAROSCOPIC CHOLECYSTECTOMY WITH INTRAOPERATIVE CHOLANGIOGRAM;  Surgeon: Camellia CHRISTELLA Blush, MD;  Location: [facility];  Service: General;  Laterality: N/A;  laparoscopic cholecystectomy with intraoperative cholangiogram   CHONDROPLASTY Right 09/19/2015   Procedure: CHONDROPLASTY;  Surgeon: Norleen Gavel, MD;  Location: [facility];  Service: Orthopedics;  Laterality: Right;   ENDOVENOUS ABLATION SAPHENOUS VEIN W/ LASER Right 01/13/2019   endovenous laser ablation right greater saphenous vein and stab phlebectomy 10-20 incisions right leg by Lonni Blade MD    IR ABLATE LIVER CRYOABLATION  04/03/2020   IR RADIOLOGIST EVAL & MGMT  03/28/2020   KNEE ARTHROSCOPY WITH LATERAL MENISECTOMY Right 09/19/2015   Procedure: KNEE ARTHROSCOPY WITH PARTIAL LATERAL MENISECTOMY;  Surgeon: Norleen Gavel, MD;  Location: [facility];  Service: Orthopedics;  Laterality: Right;   KNEE ARTHROSCOPY WITH MEDIAL MENISECTOMY Right 09/19/2015   Procedure: KNEE ARTHROSCOPY WITH PARTIAL MEDIAL MENISECTOMY;  Surgeon: Norleen Gavel, MD;  Location: [facility];  Service: Orthopedics;  Laterality: Right;   OPEN SURGICAL REPAIR OF GLUTEAL TENDON  Right 10/24/2024   Procedure: REPAIR, TENDON, GLUTEUS MEDIUS, ENDOSCOPIC;  Surgeon: Genelle Standing, MD;  Location: [facility];  Service: Orthopedics;  Laterality: Right;  RIGHT HIP ENDOSCOPIC GLUTEUS MEDIUS REPAIR   ROTATOR CUFF REPAIR Left    with revision   spider bite     black widow or brown recluse   TOE FUSION Left    TOTAL HIP ARTHROPLASTY Right 06/15/2023   Procedure: TOTAL HIP ARTHROPLASTY ANTERIOR APPROACH;  Surgeon: Gavel Norleen, MD;  Location: [facility];  Service: Orthopedics;  Laterality: Right;   TOTAL KNEE ARTHROPLASTY Right 03/31/2018   Procedure: RIGHT TOTAL KNEE ARTHROPLASTY;  Surgeon: Gavel Norleen, MD;  Location: [facility];  Service: Orthopedics;  Laterality: Right;   TOTAL KNEE REVISION Right 11/04/2019   Procedure: RIGHT TOTAL KNEE REVISION;  Surgeon: Gavel Norleen, MD;  Location: [facility];  Service: Orthopedics;  Laterality: Right;   TRIGGER FINGER RELEASE Right    Patient Active Problem List   Diagnosis Date Noted   Tear of right gluteus medius tendon 10/24/2024   Primary osteoarthritis of right hip 06/14/2023   Meralgia paraesthetica, right 06/10/2023   Spondylosis without myelopathy or radiculopathy, lumbar region 06/10/2023   Painful total knee replacement, right 11/04/2019   Arthrofibrosis of knee joint, right 11/04/2019   S/P revision of total knee, right 11/04/2019   Primary osteoarthritis of right knee 03/31/2018   Hypertension 12/02/2011   Obesity (BMI 30-39.9) 12/02/2011    PCP: [facility]  REFERRING PROVIDER: Gavel Norleen, MD/  glute repair Genelle Standing, MD  REFERRING DIAG: S/P right hip trochanteric bursectomy and IT band release 10/28/24 - S76.011A (ICD-10-CM) - Tear of right gluteus medius tendon, initial encounter  PROCEDURE: 1. Right hip gluteus medius tear repair 2. Right hip trochanteric bursectomy DOS 10/24/24  THERAPY DIAG:  Other symptoms and signs involving the musculoskeletal system  Pain in right  hip  Other abnormalities of gait and mobility  Muscle weakness (generalized)  Rationale for Evaluation and Treatment: Rehabilitation  ONSET DATE: 3 weeks ago; Glute repair 10/24/24  SUBJECTIVE:   SUBJECTIVE STATEMENT:  Patient states doing well. Tight/sore in front of hip.   PERTINENT HISTORY: Hx R THA, R TKA, hx LBP, HTN,   PAIN:  Are you having pain? Yes: NPRS scale: 5/10 Pain location: R hip  Pain description: aching, sharp L knee; everywhere else is more an ache, R hip feels like it has a pressure on it  Aggravating factors: not sure  Relieving factors: nothing/time and rest   PRECAUTIONS: None  WEIGHT BEARING RESTRICTIONS: No  FALLS:  Has patient fallen in last 6 months? No  PLOF: Independent  PATIENT GOALS:  a little bit of relief  OBJECTIVE: (objective measures from initial evaluation unless otherwise dated) OBSERVATION:  10/28/24: sutures intact, no s/s of infection   MRI 8/25 right hip  IMPRESSION: 1. Right total hip arthroplasty with susceptibility artifact partially obscuring the adjacent soft tissue and osseous structures. No periarticular fluid collection or osteolysis. 2. Large amount of fluid in the right greater trochanteric bursa consistent with bursitis. 3. Mild osteoarthritis of the left hip.  PATIENT SURVEYS:  LEFS  Extreme difficulty/unable (0), Quite a bit of difficulty (1), Moderate difficulty (2), Little difficulty (3), No difficulty (4) Survey date:  07/07/24 09/02/24 10/04/24 10/28/24  Any of your usual work, housework or school activities 1 1  2   2. Usual hobbies, recreational or sporting activities 1 2  1   3. Getting into/out of the bath 1 1  1   4. Walking between rooms 2 2  2   5. Putting on socks/shoes 1 1  1   6. Squatting  2 1  1   7. Lifting an object, like a bag of groceries from the floor 2 2  1   8. Performing light activities around your home 2 2  1   9. Performing heavy activities around your home 0 0  0  10. Getting  into/out of a car 2 1  1   11. Walking 2 blocks 0 1  1  12. Walking 1 mile 0 0  0  13. Going up/down 10 stairs (1 flight) 1 1  1   14. Standing for 1 hour 1 1  1   15.  sitting for 1 hour 2 2  2   16. Running on even ground 0 0  0  17. Running on uneven ground 0 0  0  18. Making sharp turns while running fast 0 0  0  19. Hopping  0 0  0  20. Rolling over in bed 1 1  1   Score total:  19/80 19/80 23/80  17/80     COGNITION: Overall cognitive status: Within functional limits for tasks assessed     SENSATION: WFL  POSTURE: No Significant postural limitations  PALPATION: TTP R greater Troch   LOWER EXTREMITY ROM: 10/28/24: WFL for tasks assessed  Active ROM Right eval Left eval Right 10/28/24 Left 10/28/24  Hip flexion   90   Hip extension      Hip abduction  Hip adduction      Hip internal rotation      Hip external rotation      Knee flexion      Knee extension      Ankle dorsiflexion      Ankle plantarflexion      Ankle inversion      Ankle eversion       (Blank rows = not tested) *= pain/symptoms  LOWER EXTREMITY MMT: 10/28/24: not fully assessed due to post op status  MMT Right eval Left eval Right 09/02/24 Left 09/02/24 Right  10/04/24 Right 10/28/24 Left 10/28/24  Hip flexion 4+ 5 4+ 5 4+ -5- 4+ 4+  Hip extension 4 4+ 4+ 5 4+ -5-    Hip abduction (sidelying) 4-  4+ 5 4+    Hip adduction         Hip internal rotation         Hip external rotation         Knee flexion 5 5 5 5  5 5   Knee extension 5 5 5 5  5 5   Ankle dorsiflexion         Ankle plantarflexion         Ankle inversion         Ankle eversion          (Blank rows = not tested) *= pain/symptoms  FUNCTIONAL TESTS:  5 times sit to stand: 26.97 seconds  Stairs: 7 inch, step too pattern, uses LLE 5xSTS 09/02/24: 17.38 without use of Ues  10/28/24: not fully assessed due to post op status  GAIT: Distance walked: 100 feet  Assistive device utilized: None Level of assistance: Complete  Independence Comments: antalgic on RLE with truncal lean to R  GAIT: 10/28/24 Distance walked: 100 feet  Assistive device utilized: None Level of assistance: Complete Independence Comments: antalgic on RLE with truncal lean to R  TODAY'S TREATMENT:     11/08/24 STM TFL and hip flexor Education on gait mechanics Bike 5 minutes  Hooklying glute set 5 x 5 second holds, with small lift 2 x 10 Prone glute sets 2 x 10 with 5-10 second holds Quadruped rocking for hip flexion 2 x 10 Standing weight shifts with glute set 2 x 10 Standing glute set with LLE on step and overhead lift 5# 2 x 10   11/04/24 Nustep level 5, 5 minutes for dynamic warm up Hip adduction isometric 10 x 5-10 second holds TA activation with small bent knee fall out 2 x 10 SAQ 2# 2 x 10 with 5 second holds Prone glute sets 2 x 10 with 5-10 second holds Prone hamstring curl 5# 2 x 10 LAQ 5# 2 x 10   10/28/24 Reassessment post op Dressing change, education Glute sets 10 x 5-10 second holds Hip adduction isometric 10 x 5-10 second holds Prone Hamstring curl 1 x 10 LAQ    10/21/24 NuStep x7 min  STM to right glute med, ITB, and biceps femoris  Hip extension/abduction isometrics  LAQ x10 Education and discussion of post-op protocol and exercises  Mini squats   10/12/24  Nustep L5x8 minutes all four extremities seat 10 Forward step ups 6 inch box holding 15# KB x15 B Lateral step ups 6 inch box holding 15# KB x10 B DLs 84# k87 to top of 6 inch box on horizontal edge Hip hikes x12 B Hip hikes + ABD x10 B Bridges + ABD into green TB x10 Sidelying hip ABD green TB x10  Figure 4 stretch 2x30 stretches  Roller to R HS and lateral thigh   General education on the type of procedure he is having early December, typical PT restrictions after and why, general expected course of care/progression/recovery time      10/07/24  Nustep seat 10 all four extremities L5x8 minutes   Hip flexor stretch off edge of  mat table 2x30 seconds Figure four stretch 2x30 seconds B Hooklying HS 2x30 seconds B with sheet Lumbar rotation stretch 5x5 seconds B SKTC 5x5 seconds B   Roller R HS and lateral thigh    PATIENT EDUCATION:  Education details: exercise progression/ modification, glute med repair protocol, reassessment findings, POC, use of SPC Person educated: Patient Education method: Explanation, Demonstration,  Education comprehension: verbalized understanding, returned demonstration, verbal cues required, and tactile cues required  HOME EXERCISE PROGRAM: POST OP GLUTE Access Code: [email or web address] URL: [email or web address] Date: 10/28/2024 Prepared by: Prentice Annie Roseboom  Exercises - Hooklying Gluteal Sets  - 3 x daily - 7 x weekly - 10 reps - 5-10 second hold - Supine Hip Adduction Isometric with Ball  - 1 x daily - 7 x weekly - 10 reps - 5-10 second hold - Prone Knee Flexion (Mirrored)  - 3 x daily - 7 x weekly - 3 sets - 10 reps - Abdominal Bracing  - 3 x daily - 7 x weekly - 10 reps - 5-10 second hold - Seated Long Arc Quad  - 3 x daily - 7 x weekly - 2 sets - 10 reps - 5-10 second hold   Access Code: [email or web address] URL: [email or web address] Date: 07/07/2024 Prepared by: Prentice Buffy Ehler  Exercises - Supine Bridge  - 1 x daily - 7 x weekly - 3 sets - 10 reps - Clamshell (Mirrored)  - 1 x daily - 7 x weekly - 3 sets - 10 reps  Access Code: [email or web address] URL: [email or web address] Date: 09/27/2024 Prepared by: Frankie Ziemba  Exercises - Stool Scoots  - 1 x daily - 1-3 x weekly - Noodle press  - 1 x daily - 1-3 x weekly - 1-3 sets - 10 reps - Noodle Stomp  - 1 x daily - 1-3 x weekly - 1-2 sets - 10 reps - Standing Balance on Noodle at [employer]  - 1 x daily - 1-3 x weekly - 1-3 reps - 20 hold - Squat on Noodle  - 1 x daily - 1-3 x weekly - 1-3 sets - 10 reps - Plank on Long Hand Float with Leg Lift  - 1 x daily - 1-3 x weekly - 1-2 sets - 10 reps -  Flutter Kicking/Windshield Wipers  - 1 x daily - 1-3 x weekly - 3 sets - 10 reps - Runner's Step Up/Down  - 1 x daily - 1-3 x weekly - 1-3 sets - 10 reps - arm swing resisted with hand bells  - 1 x daily - 1-3 x weekly - 3 sets - 5-10 reps ASSESSMENT:  CLINICAL IMPRESSION: Patient with tender and hyperactive hip flexor and TFL, which improves with manual. Began session on bike for dynamic warm up. Continued with progression through glute repair protocol with modifications made on RPE with most exercises completed at low RPE but protocol limiting further progression. Patient will continue to benefit from physical therapy in order to improve function and reduce impairment.     OBJECTIVE IMPAIRMENTS: Abnormal gait, decreased activity tolerance, decreased balance, decreased endurance, decreased mobility, difficulty walking, decreased ROM, decreased strength, increased muscle spasms, impaired flexibility, improper  body mechanics, and pain  ACTIVITY LIMITATIONS: lifting, bending, standing, squatting, stairs, transfers, locomotion level, and caring for others  PARTICIPATION LIMITATIONS: meal prep, cleaning, laundry, shopping, community activity, occupation, and yard work  PERSONAL FACTORS: Fitness and 3+ comorbidities: Hx R THA, R TKA, hx LBP, HTN are also affecting patient's functional outcome.   REHAB POTENTIAL: Good  CLINICAL DECISION MAKING: Evolving/moderate complexity  EVALUATION COMPLEXITY: Moderate   GOALS: Goals reviewed with patient? Yes  SHORT TERM GOALS: Target date: 08/04/2024    Patient will be independent with HEP in order to improve functional outcomes. Baseline: Goal status: Met 08/03/23  2.  Patient will report at least 25% improvement in symptoms/functional status for improved quality of life. Baseline: Goal status: Met 09/02/24   LONG TERM GOALS: Target date: 09/01/2024  Patient will report at least 75% improvement in symptoms for improved quality of  life. Baseline:  Goal status: Progressing 09/02/24 (40%)  2.  Patient will improve LEFS  score by at least 15 points in order to indicate improved tolerance to activity. Baseline:  Goal status: Progressing 09/02/24; progressing 10/04/24  3.  Patient will be able to navigate stairs with reciprocal pattern without compensation in order to demonstrate improved LE strength. Baseline:  Goal status: Progressing 09/02/24  4. Patient will demonstrate grade of 5/5 MMT grade in all tested musculature as evidence of improved strength to assist with stair ambulation and gait. Baseline:  Goal status: Progressing 09/02/24; Partially met (LLE)/ progressing R 10/04/24  5.  Patient will be able to complete 5x STS in under 15 seconds in order to reduce the risk of falls. Baseline:  Goal status: Progressing 09/02/24  6.   SLS 30s without compensation for improved gait Baseline:  Goal status: New PLAN:  PT FREQUENCY: 1-2x/week  PT DURATION: 12 weeks  PLANNED INTERVENTIONS: [id number]- PT Re-evaluation, 97110-Therapeutic exercises, 97530- Therapeutic activity, [id number]- Neuromuscular re-education, 97535- Self Care, [id number]- Manual therapy, [id number]- Gait training, [phone]- Orthotic Fit/training, [phone]- Canalith repositioning, [id number]- Aquatic Therapy, [phone]- Splinting, [phone]- Wound care (first 20 sq cm), 97598- Wound care (each additional 20 sq cm)Patient/Family education, Balance training, Stair training, Taping, Dry Needling, Joint mobilization, Joint manipulation, Spinal manipulation, Spinal mobilization, Scar mobilization, and DME instructions.  PLAN FOR NEXT SESSION: progress with hip gluteus medius tear repair   Prentice GORMAN Stains, PT, DPT 11/08/2024, 11:45 AM     "

## 2024-11-15 ENCOUNTER — Encounter (HOSPITAL_BASED_OUTPATIENT_CLINIC_OR_DEPARTMENT_OTHER): Admitting: Physical Therapy

## 2024-11-22 ENCOUNTER — Encounter (HOSPITAL_BASED_OUTPATIENT_CLINIC_OR_DEPARTMENT_OTHER): Payer: Self-pay | Admitting: Physical Therapy

## 2024-11-22 ENCOUNTER — Ambulatory Visit (HOSPITAL_BASED_OUTPATIENT_CLINIC_OR_DEPARTMENT_OTHER): Attending: Orthopedic Surgery | Admitting: Physical Therapy

## 2024-11-22 DIAGNOSIS — M6281 Muscle weakness (generalized): Secondary | ICD-10-CM | POA: Diagnosis present

## 2024-11-22 DIAGNOSIS — R2689 Other abnormalities of gait and mobility: Secondary | ICD-10-CM | POA: Diagnosis present

## 2024-11-22 DIAGNOSIS — M25551 Pain in right hip: Secondary | ICD-10-CM | POA: Diagnosis present

## 2024-11-22 DIAGNOSIS — R29898 Other symptoms and signs involving the musculoskeletal system: Secondary | ICD-10-CM | POA: Insufficient documentation

## 2024-11-22 NOTE — Therapy (Signed)
 " OUTPATIENT PHYSICAL THERAPY TREATMENT    Patient Name: Reginald [name] MRN: [id number] DOB:February 02, 1966, 59 y.o., male Today's Date: 11/22/2024    END OF SESSION:  PT End of Session - 11/22/24 0852     Visit Number 27    Number of Visits 56    Date for Recertification  01/20/25    Authorization Type [employer]    Progress Note Due on Visit 34    PT Start Time 0849    PT Stop Time 0929    PT Time Calculation (min) 40 min    Activity Tolerance Patient tolerated treatment well    Behavior During Therapy [facility] for tasks assessed/performed          Past Medical History:  Diagnosis Date   Arthritis    Gallstones    Hypertension    Past Surgical History:  Procedure Laterality Date   CHOLECYSTECTOMY  12/03/2011   Procedure: LAPAROSCOPIC CHOLECYSTECTOMY WITH INTRAOPERATIVE CHOLANGIOGRAM;  Surgeon: Camellia CHRISTELLA Blush, MD;  Location: [facility] OR;  Service: General;  Laterality: N/A;  laparoscopic cholecystectomy with intraoperative cholangiogram   CHONDROPLASTY Right 09/19/2015   Procedure: CHONDROPLASTY;  Surgeon: Norleen Gavel, MD;  Location: [facility];  Service: Orthopedics;  Laterality: Right;   ENDOVENOUS ABLATION SAPHENOUS VEIN W/ LASER Right 01/13/2019   endovenous laser ablation right greater saphenous vein and stab phlebectomy 10-20 incisions right leg by Lonni Blade MD    IR ABLATE LIVER CRYOABLATION  04/03/2020   IR RADIOLOGIST EVAL & MGMT  03/28/2020   KNEE ARTHROSCOPY WITH LATERAL MENISECTOMY Right 09/19/2015   Procedure: KNEE ARTHROSCOPY WITH PARTIAL LATERAL MENISECTOMY;  Surgeon: Norleen Gavel, MD;  Location: [facility];  Service: Orthopedics;  Laterality: Right;   KNEE ARTHROSCOPY WITH MEDIAL MENISECTOMY Right 09/19/2015   Procedure: KNEE ARTHROSCOPY WITH PARTIAL MEDIAL MENISECTOMY;  Surgeon: Norleen Gavel, MD;  Location: [facility];  Service: Orthopedics;  Laterality: Right;   OPEN SURGICAL REPAIR OF GLUTEAL TENDON Right  10/24/2024   Procedure: REPAIR, TENDON, GLUTEUS MEDIUS, ENDOSCOPIC;  Surgeon: Genelle Standing, MD;  Location: [facility];  Service: Orthopedics;  Laterality: Right;  RIGHT HIP ENDOSCOPIC GLUTEUS MEDIUS REPAIR   ROTATOR CUFF REPAIR Left    with revision   spider bite     black widow or brown recluse   TOE FUSION Left    TOTAL HIP ARTHROPLASTY Right 06/15/2023   Procedure: TOTAL HIP ARTHROPLASTY ANTERIOR APPROACH;  Surgeon: Gavel Norleen, MD;  Location: [facility];  Service: Orthopedics;  Laterality: Right;   TOTAL KNEE ARTHROPLASTY Right 03/31/2018   Procedure: RIGHT TOTAL KNEE ARTHROPLASTY;  Surgeon: Gavel Norleen, MD;  Location: [facility];  Service: Orthopedics;  Laterality: Right;   TOTAL KNEE REVISION Right 11/04/2019   Procedure: RIGHT TOTAL KNEE REVISION;  Surgeon: Gavel Norleen, MD;  Location: [facility];  Service: Orthopedics;  Laterality: Right;   TRIGGER FINGER RELEASE Right    Patient Active Problem List   Diagnosis Date Noted   Tear of right gluteus medius tendon 10/24/2024   Primary osteoarthritis of right hip 06/14/2023   Meralgia paraesthetica, right 06/10/2023   Spondylosis without myelopathy or radiculopathy, lumbar region 06/10/2023   Painful total knee replacement, right 11/04/2019   Arthrofibrosis of knee joint, right 11/04/2019   S/P revision of total knee, right 11/04/2019   Primary osteoarthritis of right knee 03/31/2018   Hypertension 12/02/2011   Obesity (BMI 30-39.9) 12/02/2011    PCP: [facility]  REFERRING PROVIDER: Gavel Norleen, MD/  glute repair Genelle Standing, MD  REFERRING DIAG: S/P right hip trochanteric bursectomy and IT band release 10/28/24 - S76.011A (ICD-10-CM) - Tear of right gluteus medius tendon, initial encounter  PROCEDURE: 1. Right hip gluteus medius tear repair 2. Right hip trochanteric bursectomy DOS 10/24/24  THERAPY DIAG:  Other symptoms and signs involving the musculoskeletal system  Pain in right hip  Other  abnormalities of gait and mobility  Muscle weakness (generalized)  Rationale for Evaluation and Treatment: Rehabilitation  ONSET DATE: 3 weeks ago; Glute repair 10/24/24  SUBJECTIVE:   SUBJECTIVE STATEMENT:  Patient states feeling alright. Patient states his other knee is bothering him.   PERTINENT HISTORY: Hx R THA, R TKA, hx LBP, HTN,   PAIN:  Are you having pain? Yes: NPRS scale: 5/10 Pain location: R hip  Pain description: aching, sharp L knee; everywhere else is more an ache, R hip feels like it has a pressure on it  Aggravating factors: not sure  Relieving factors: nothing/time and rest   PRECAUTIONS: None  WEIGHT BEARING RESTRICTIONS: No  FALLS:  Has patient fallen in last 6 months? No  PLOF: Independent  PATIENT GOALS:  a little bit of relief  OBJECTIVE: (objective measures from initial evaluation unless otherwise dated) OBSERVATION:  10/28/24: sutures intact, no s/s of infection   MRI 8/25 right hip  IMPRESSION: 1. Right total hip arthroplasty with susceptibility artifact partially obscuring the adjacent soft tissue and osseous structures. No periarticular fluid collection or osteolysis. 2. Large amount of fluid in the right greater trochanteric bursa consistent with bursitis. 3. Mild osteoarthritis of the left hip.  PATIENT SURVEYS:  LEFS  Extreme difficulty/unable (0), Quite a bit of difficulty (1), Moderate difficulty (2), Little difficulty (3), No difficulty (4) Survey date:  07/07/24 09/02/24 10/04/24 10/28/24  Any of your usual work, housework or school activities 1 1  2   2. Usual hobbies, recreational or sporting activities 1 2  1   3. Getting into/out of the bath 1 1  1   4. Walking between rooms 2 2  2   5. Putting on socks/shoes 1 1  1   6. Squatting  2 1  1   7. Lifting an object, like a bag of groceries from the floor 2 2  1   8. Performing light activities around your home 2 2  1   9. Performing heavy activities around your home 0 0  0  10.  Getting into/out of a car 2 1  1   11. Walking 2 blocks 0 1  1  12. Walking 1 mile 0 0  0  13. Going up/down 10 stairs (1 flight) 1 1  1   14. Standing for 1 hour 1 1  1   15.  sitting for 1 hour 2 2  2   16. Running on even ground 0 0  0  17. Running on uneven ground 0 0  0  18. Making sharp turns while running fast 0 0  0  19. Hopping  0 0  0  20. Rolling over in bed 1 1  1   Score total:  19/80 19/80 23/80  17/80     COGNITION: Overall cognitive status: Within functional limits for tasks assessed     SENSATION: WFL  POSTURE: No Significant postural limitations  PALPATION: TTP R greater Troch   LOWER EXTREMITY ROM: 10/28/24: WFL for tasks assessed  Active ROM Right eval Left eval Right 10/28/24 Left 10/28/24  Hip flexion   90   Hip extension      Hip  abduction      Hip adduction      Hip internal rotation      Hip external rotation      Knee flexion      Knee extension      Ankle dorsiflexion      Ankle plantarflexion      Ankle inversion      Ankle eversion       (Blank rows = not tested) *= pain/symptoms  LOWER EXTREMITY MMT: 10/28/24: not fully assessed due to post op status  MMT Right eval Left eval Right 09/02/24 Left 09/02/24 Right  10/04/24 Right 10/28/24 Left 10/28/24  Hip flexion 4+ 5 4+ 5 4+ -5- 4+ 4+  Hip extension 4 4+ 4+ 5 4+ -5-    Hip abduction (sidelying) 4-  4+ 5 4+    Hip adduction         Hip internal rotation         Hip external rotation         Knee flexion 5 5 5 5  5 5   Knee extension 5 5 5 5  5 5   Ankle dorsiflexion         Ankle plantarflexion         Ankle inversion         Ankle eversion          (Blank rows = not tested) *= pain/symptoms  FUNCTIONAL TESTS:  5 times sit to stand: 26.97 seconds  Stairs: 7 inch, step too pattern, uses LLE 5xSTS 09/02/24: 17.38 without use of [facility]  10/28/24: not fully assessed due to post op status  GAIT: Distance walked: 100 feet  Assistive device utilized: None Level of assistance:  Complete Independence Comments: antalgic on RLE with truncal lean to R  GAIT: 10/28/24 Distance walked: 100 feet  Assistive device utilized: None Level of assistance: Complete Independence Comments: antalgic on RLE with truncal lean to R  TODAY'S TREATMENT:     11/22/24 Bike 5 minutes  Manual: STM TFL, adductors, and hip flexor; lateral distraction with belt Supine hip adductor stretch 5 x 10 second holds Supine hip abduction isometrics with belt 2 x 10 with 5-10 second holds Supine gentle hip flexor isometric 10 x 5-10 second holds Bridge 10# 3 x 10 Prone hip extension 2 x 10   11/08/24 STM TFL and hip flexor Education on gait mechanics Bike 5 minutes  Hooklying glute set 5 x 5 second holds, with small lift 2 x 10 Prone glute sets 2 x 10 with 5-10 second holds Quadruped rocking for hip flexion 2 x 10 Standing weight shifts with glute set 2 x 10 Standing glute set with LLE on step and overhead lift 5# 2 x 10   11/04/24 Nustep level 5, 5 minutes for dynamic warm up Hip adduction isometric 10 x 5-10 second holds TA activation with small bent knee fall out 2 x 10 SAQ 2# 2 x 10 with 5 second holds Prone glute sets 2 x 10 with 5-10 second holds Prone hamstring curl 5# 2 x 10 LAQ 5# 2 x 10   10/28/24 Reassessment post op Dressing change, education Glute sets 10 x 5-10 second holds Hip adduction isometric 10 x 5-10 second holds Prone Hamstring curl 1 x 10 LAQ    10/21/24 NuStep x7 min  STM to right glute med, ITB, and biceps femoris  Hip extension/abduction isometrics  LAQ x10 Education and discussion of post-op protocol and exercises  Mini squats   10/12/24  Nustep L5x8 minutes all four extremities seat 10 Forward step ups 6 inch box holding 15# KB x15 B Lateral step ups 6 inch box holding 15# KB x10 B DLs 84# k87 to top of 6 inch box on horizontal edge Hip hikes x12 B Hip hikes + ABD x10 B Bridges + ABD into green TB x10 Sidelying hip ABD green TB x10   Figure 4 stretch 2x30 stretches  Roller to R HS and lateral thigh   General education on the type of procedure he is having early December, typical PT restrictions after and why, general expected course of care/progression/recovery time      10/07/24  Nustep seat 10 all four extremities L5x8 minutes   Hip flexor stretch off edge of mat table 2x30 seconds Figure four stretch 2x30 seconds B Hooklying HS 2x30 seconds B with sheet Lumbar rotation stretch 5x5 seconds B SKTC 5x5 seconds B   Roller R HS and lateral thigh    PATIENT EDUCATION:  Education details: exercise progression/ modification, glute med repair protocol, reassessment findings, POC, use of SPC Person educated: Patient Education method: Explanation, Demonstration,  Education comprehension: verbalized understanding, returned demonstration, verbal cues required, and tactile cues required  HOME EXERCISE PROGRAM: POST OP GLUTE Access Code: [email or web address] URL: [email or web address] Date: 10/28/2024 Prepared by: Prentice [employer] Leh  Exercises - Hooklying Gluteal Sets  - 3 x daily - 7 x weekly - 10 reps - 5-10 second hold - Supine Hip Adduction Isometric with Ball  - 1 x daily - 7 x weekly - 10 reps - 5-10 second hold - Prone Knee Flexion (Mirrored)  - 3 x daily - 7 x weekly - 3 sets - 10 reps - Abdominal Bracing  - 3 x daily - 7 x weekly - 10 reps - 5-10 second hold - Seated Long Arc Quad  - 3 x daily - 7 x weekly - 2 sets - 10 reps - 5-10 second hold   Access Code: [email or web address] URL: [email or web address] Date: 07/07/2024 Prepared by: Prentice Wilbert Schouten  Exercises - Supine Bridge  - 1 x daily - 7 x weekly - 3 sets - 10 reps - Clamshell (Mirrored)  - 1 x daily - 7 x weekly - 3 sets - 10 reps  Access Code: [email or web address] URL: [email or web address] Date: 09/27/2024 Prepared by: Frankie Ziemba  Exercises - Stool Scoots  - 1 x daily - 1-3 x weekly - Noodle press  - 1 x daily - 1-3 x  weekly - 1-3 sets - 10 reps - Noodle Stomp  - 1 x daily - 1-3 x weekly - 1-2 sets - 10 reps - Standing Balance on Noodle at [employer]  - 1 x daily - 1-3 x weekly - 1-3 reps - 20 hold - Squat on Noodle  - 1 x daily - 1-3 x weekly - 1-3 sets - 10 reps - Plank on Long Hand Float with Leg Lift  - 1 x daily - 1-3 x weekly - 1-2 sets - 10 reps - Flutter Kicking/Windshield Wipers  - 1 x daily - 1-3 x weekly - 3 sets - 10 reps - Runner's Step Up/Down  - 1 x daily - 1-3 x weekly - 1-3 sets - 10 reps - arm swing resisted with hand bells  - 1 x daily - 1-3 x weekly - 3 sets - 5-10 reps ASSESSMENT:  CLINICAL IMPRESSION: Began session on bike for dynamic warm up. Patient with tender and hyperactive hip flexor, adductor, and TFL, which improves with manual but remains  hyperactive likely due to glute weakness. Continued with progression through glute repair protocol. Patient will continue to benefit from physical therapy in order to improve function and reduce impairment.     OBJECTIVE IMPAIRMENTS: Abnormal gait, decreased activity tolerance, decreased balance, decreased endurance, decreased mobility, difficulty walking, decreased ROM, decreased strength, increased muscle spasms, impaired flexibility, improper body mechanics, and pain  ACTIVITY LIMITATIONS: lifting, bending, standing, squatting, stairs, transfers, locomotion level, and caring for others  PARTICIPATION LIMITATIONS: meal prep, cleaning, laundry, shopping, community activity, occupation, and yard work  PERSONAL FACTORS: Fitness and 3+ comorbidities: Hx R THA, R TKA, hx LBP, HTN are also affecting patient's functional outcome.   REHAB POTENTIAL: Good  CLINICAL DECISION MAKING: Evolving/moderate complexity  EVALUATION COMPLEXITY: Moderate   GOALS: Goals reviewed with patient? Yes  SHORT TERM GOALS: Target date: 08/04/2024    Patient will be independent with HEP in order to improve functional outcomes. Baseline: Goal status: Met  08/03/23  2.  Patient will report at least 25% improvement in symptoms/functional status for improved quality of life. Baseline: Goal status: Met 09/02/24   LONG TERM GOALS: Target date: 09/01/2024  Patient will report at least 75% improvement in symptoms for improved quality of life. Baseline:  Goal status: Progressing 09/02/24 (40%)  2.  Patient will improve LEFS  score by at least 15 points in order to indicate improved tolerance to activity. Baseline:  Goal status: Progressing 09/02/24; progressing 10/04/24  3.  Patient will be able to navigate stairs with reciprocal pattern without compensation in order to demonstrate improved LE strength. Baseline:  Goal status: Progressing 09/02/24  4. Patient will demonstrate grade of 5/5 MMT grade in all tested musculature as evidence of improved strength to assist with stair ambulation and gait. Baseline:  Goal status: Progressing 09/02/24; Partially met (LLE)/ progressing R 10/04/24  5.  Patient will be able to complete 5x STS in under 15 seconds in order to reduce the risk of falls. Baseline:  Goal status: Progressing 09/02/24  6.   SLS 30s without compensation for improved gait Baseline:  Goal status: New PLAN:  PT FREQUENCY: 1-2x/week  PT DURATION: 12 weeks  PLANNED INTERVENTIONS: [id number]- PT Re-evaluation, 97110-Therapeutic exercises, 97530- Therapeutic activity, [id number]- Neuromuscular re-education, 97535- Self Care, [id number]- Manual therapy, [id number]- Gait training, [phone]- Orthotic Fit/training, [phone]- Canalith repositioning, [id number]- Aquatic Therapy, [phone]- Splinting, [phone]- Wound care (first 20 sq cm), 97598- Wound care (each additional 20 sq cm)Patient/Family education, Balance training, Stair training, Taping, Dry Needling, Joint mobilization, Joint manipulation, Spinal manipulation, Spinal mobilization, Scar mobilization, and DME instructions.  PLAN FOR NEXT SESSION: progress with hip gluteus medius tear repair   Prentice GORMAN Stains,  PT, DPT 11/22/2024, 8:52 AM     "

## 2024-11-24 ENCOUNTER — Ambulatory Visit (HOSPITAL_BASED_OUTPATIENT_CLINIC_OR_DEPARTMENT_OTHER): Admitting: Physical Therapy

## 2024-11-24 ENCOUNTER — Encounter (HOSPITAL_BASED_OUTPATIENT_CLINIC_OR_DEPARTMENT_OTHER): Payer: Self-pay | Admitting: Physical Therapy

## 2024-11-24 DIAGNOSIS — M25551 Pain in right hip: Secondary | ICD-10-CM

## 2024-11-24 DIAGNOSIS — M6281 Muscle weakness (generalized): Secondary | ICD-10-CM

## 2024-11-24 DIAGNOSIS — R2689 Other abnormalities of gait and mobility: Secondary | ICD-10-CM

## 2024-11-24 DIAGNOSIS — R29898 Other symptoms and signs involving the musculoskeletal system: Secondary | ICD-10-CM

## 2024-11-24 NOTE — Therapy (Signed)
 " OUTPATIENT PHYSICAL THERAPY TREATMENT    Patient Name: Reginald [name] MRN: [id number] DOB:12/17/1965, 59 y.o., male Today's Date: 11/24/2024    END OF SESSION:  PT End of Session - 11/24/24 0936     Visit Number 28    Number of Visits 56    Date for Recertification  01/20/25    Authorization Type [employer]    Progress Note Due on Visit 34    PT Start Time 0933    PT Stop Time 1013    PT Time Calculation (min) 40 min    Activity Tolerance Patient tolerated treatment well    Behavior During Therapy [facility] for tasks assessed/performed          Past Medical History:  Diagnosis Date   Arthritis    Gallstones    Hypertension    Past Surgical History:  Procedure Laterality Date   CHOLECYSTECTOMY  12/03/2011   Procedure: LAPAROSCOPIC CHOLECYSTECTOMY WITH INTRAOPERATIVE CHOLANGIOGRAM;  Surgeon: Camellia CHRISTELLA Blush, MD;  Location: [facility];  Service: General;  Laterality: N/A;  laparoscopic cholecystectomy with intraoperative cholangiogram   CHONDROPLASTY Right 09/19/2015   Procedure: CHONDROPLASTY;  Surgeon: Norleen Gavel, MD;  Location: [facility];  Service: Orthopedics;  Laterality: Right;   ENDOVENOUS ABLATION SAPHENOUS VEIN W/ LASER Right 01/13/2019   endovenous laser ablation right greater saphenous vein and stab phlebectomy 10-20 incisions right leg by Lonni Blade MD    IR ABLATE LIVER CRYOABLATION  04/03/2020   IR RADIOLOGIST EVAL & MGMT  03/28/2020   KNEE ARTHROSCOPY WITH LATERAL MENISECTOMY Right 09/19/2015   Procedure: KNEE ARTHROSCOPY WITH PARTIAL LATERAL MENISECTOMY;  Surgeon: Norleen Gavel, MD;  Location: [facility];  Service: Orthopedics;  Laterality: Right;   KNEE ARTHROSCOPY WITH MEDIAL MENISECTOMY Right 09/19/2015   Procedure: KNEE ARTHROSCOPY WITH PARTIAL MEDIAL MENISECTOMY;  Surgeon: Norleen Gavel, MD;  Location: [facility];  Service: Orthopedics;  Laterality: Right;   OPEN SURGICAL REPAIR OF GLUTEAL TENDON Right  10/24/2024   Procedure: REPAIR, TENDON, GLUTEUS MEDIUS, ENDOSCOPIC;  Surgeon: Genelle Standing, MD;  Location: [facility];  Service: Orthopedics;  Laterality: Right;  RIGHT HIP ENDOSCOPIC GLUTEUS MEDIUS REPAIR   ROTATOR CUFF REPAIR Left    with revision   spider bite     black widow or brown recluse   TOE FUSION Left    TOTAL HIP ARTHROPLASTY Right 06/15/2023   Procedure: TOTAL HIP ARTHROPLASTY ANTERIOR APPROACH;  Surgeon: Gavel Norleen, MD;  Location: [facility];  Service: Orthopedics;  Laterality: Right;   TOTAL KNEE ARTHROPLASTY Right 03/31/2018   Procedure: RIGHT TOTAL KNEE ARTHROPLASTY;  Surgeon: Gavel Norleen, MD;  Location: [facility];  Service: Orthopedics;  Laterality: Right;   TOTAL KNEE REVISION Right 11/04/2019   Procedure: RIGHT TOTAL KNEE REVISION;  Surgeon: Gavel Norleen, MD;  Location: [facility];  Service: Orthopedics;  Laterality: Right;   TRIGGER FINGER RELEASE Right    Patient Active Problem List   Diagnosis Date Noted   Tear of right gluteus medius tendon 10/24/2024   Primary osteoarthritis of right hip 06/14/2023   Meralgia paraesthetica, right 06/10/2023   Spondylosis without myelopathy or radiculopathy, lumbar region 06/10/2023   Painful total knee replacement, right 11/04/2019   Arthrofibrosis of knee joint, right 11/04/2019   S/P revision of total knee, right 11/04/2019   Primary osteoarthritis of right knee 03/31/2018   Hypertension 12/02/2011   Obesity (BMI 30-39.9) 12/02/2011    PCP: [facility]  REFERRING PROVIDER: Gavel Norleen, MD/  glute repair Genelle Standing, MD  REFERRING DIAG: S/P right hip trochanteric bursectomy and IT band release 10/28/24 - S76.011A (ICD-10-CM) - Tear of right gluteus medius tendon, initial encounter  PROCEDURE: 1. Right hip gluteus medius tear repair 2. Right hip trochanteric bursectomy DOS 10/24/24  THERAPY DIAG:  Other symptoms and signs involving the musculoskeletal system  Pain in right hip  Other  abnormalities of gait and mobility  Muscle weakness (generalized)  Rationale for Evaluation and Treatment: Rehabilitation  ONSET DATE: 3 weeks ago; Glute repair 10/24/24  SUBJECTIVE:   SUBJECTIVE STATEMENT:  Patient states sore in adductors. Otherwise doing alright.  PERTINENT HISTORY: Hx R THA, R TKA, hx LBP, HTN,   PAIN:  Are you having pain? Yes: NPRS scale: 5/10 Pain location: R hip  Pain description: aching, sharp L knee; everywhere else is more an ache, R hip feels like it has a pressure on it  Aggravating factors: not sure  Relieving factors: nothing/time and rest   PRECAUTIONS: None  WEIGHT BEARING RESTRICTIONS: No  FALLS:  Has patient fallen in last 6 months? No  PLOF: Independent  PATIENT GOALS:  a little bit of relief  OBJECTIVE: (objective measures from initial evaluation unless otherwise dated) OBSERVATION:  10/28/24: sutures intact, no s/s of infection   MRI 8/25 right hip  IMPRESSION: 1. Right total hip arthroplasty with susceptibility artifact partially obscuring the adjacent soft tissue and osseous structures. No periarticular fluid collection or osteolysis. 2. Large amount of fluid in the right greater trochanteric bursa consistent with bursitis. 3. Mild osteoarthritis of the left hip.  PATIENT SURVEYS:  LEFS  Extreme difficulty/unable (0), Quite a bit of difficulty (1), Moderate difficulty (2), Little difficulty (3), No difficulty (4) Survey date:  07/07/24 09/02/24 10/04/24 10/28/24  Any of your usual work, housework or school activities 1 1  2   2. Usual hobbies, recreational or sporting activities 1 2  1   3. Getting into/out of the bath 1 1  1   4. Walking between rooms 2 2  2   5. Putting on socks/shoes 1 1  1   6. Squatting  2 1  1   7. Lifting an object, like a bag of groceries from the floor 2 2  1   8. Performing light activities around your home 2 2  1   9. Performing heavy activities around your home 0 0  0  10. Getting into/out of a  car 2 1  1   11. Walking 2 blocks 0 1  1  12. Walking 1 mile 0 0  0  13. Going up/down 10 stairs (1 flight) 1 1  1   14. Standing for 1 hour 1 1  1   15.  sitting for 1 hour 2 2  2   16. Running on even ground 0 0  0  17. Running on uneven ground 0 0  0  18. Making sharp turns while running fast 0 0  0  19. Hopping  0 0  0  20. Rolling over in bed 1 1  1   Score total:  19/80 19/80 23/80  17/80     COGNITION: Overall cognitive status: Within functional limits for tasks assessed     SENSATION: WFL  POSTURE: No Significant postural limitations  PALPATION: TTP R greater Troch   LOWER EXTREMITY ROM: 10/28/24: WFL for tasks assessed  Active ROM Right eval Left eval Right 10/28/24 Left 10/28/24  Hip flexion   90   Hip extension      Hip abduction  Hip adduction      Hip internal rotation      Hip external rotation      Knee flexion      Knee extension      Ankle dorsiflexion      Ankle plantarflexion      Ankle inversion      Ankle eversion       (Blank rows = not tested) *= pain/symptoms  LOWER EXTREMITY MMT: 10/28/24: not fully assessed due to post op status  MMT Right eval Left eval Right 09/02/24 Left 09/02/24 Right  10/04/24 Right 10/28/24 Left 10/28/24  Hip flexion 4+ 5 4+ 5 4+ -5- 4+ 4+  Hip extension 4 4+ 4+ 5 4+ -5-    Hip abduction (sidelying) 4-  4+ 5 4+    Hip adduction         Hip internal rotation         Hip external rotation         Knee flexion 5 5 5 5  5 5   Knee extension 5 5 5 5  5 5   Ankle dorsiflexion         Ankle plantarflexion         Ankle inversion         Ankle eversion          (Blank rows = not tested) *= pain/symptoms  FUNCTIONAL TESTS:  5 times sit to stand: 26.97 seconds  Stairs: 7 inch, step too pattern, uses LLE 5xSTS 09/02/24: 17.38 without use of Ues  10/28/24: not fully assessed due to post op status  GAIT: Distance walked: 100 feet  Assistive device utilized: None Level of assistance: Complete  Independence Comments: antalgic on RLE with truncal lean to R  GAIT: 10/28/24 Distance walked: 100 feet  Assistive device utilized: None Level of assistance: Complete Independence Comments: antalgic on RLE with truncal lean to R  TODAY'S TREATMENT:     11/24/24 Bike 5 minutes  Manual: STM TFL, adductors, glutes, and hip flexor; lateral distraction with belt Supine hip abduction isometrics with belt 2 x 10 with 5-10 second holds Prone hip extension 2 x 10 Prone hip extension with knee bent 2 x 10 STS from elevated table 2 x 10 SLS with UE support 3 x 20-30 second holds Standing hip adductor stretch with OH reach 5 x 10 second holds  11/22/24 Bike 5 minutes  Manual: STM TFL, adductors, and hip flexor; lateral distraction with belt Supine hip adductor stretch 5 x 10 second holds Supine hip abduction isometrics with belt 2 x 10 with 5-10 second holds Supine gentle hip flexor isometric 10 x 5-10 second holds Bridge 10# 3 x 10 Prone hip extension 2 x 10   11/08/24 STM TFL and hip flexor Education on gait mechanics Bike 5 minutes  Hooklying glute set 5 x 5 second holds, with small lift 2 x 10 Prone glute sets 2 x 10 with 5-10 second holds Quadruped rocking for hip flexion 2 x 10 Standing weight shifts with glute set 2 x 10 Standing glute set with LLE on step and overhead lift 5# 2 x 10   11/04/24 Nustep level 5, 5 minutes for dynamic warm up Hip adduction isometric 10 x 5-10 second holds TA activation with small bent knee fall out 2 x 10 SAQ 2# 2 x 10 with 5 second holds Prone glute sets 2 x 10 with 5-10 second holds Prone hamstring curl 5# 2 x 10 LAQ 5# 2 x 10  10/28/24 Reassessment post op Dressing change, education Glute sets 10 x 5-10 second holds Hip adduction isometric 10 x 5-10 second holds Prone Hamstring curl 1 x 10 LAQ    10/21/24 NuStep x7 min  STM to right glute med, ITB, and biceps femoris  Hip extension/abduction isometrics  LAQ x10 Education and  discussion of post-op protocol and exercises  Mini squats   10/12/24  Nustep L5x8 minutes all four extremities seat 10 Forward step ups 6 inch box holding 15# KB x15 B Lateral step ups 6 inch box holding 15# KB x10 B DLs 84# k87 to top of 6 inch box on horizontal edge Hip hikes x12 B Hip hikes + ABD x10 B Bridges + ABD into green TB x10 Sidelying hip ABD green TB x10  Figure 4 stretch 2x30 stretches  Roller to R HS and lateral thigh   General education on the type of procedure he is having early December, typical PT restrictions after and why, general expected course of care/progression/recovery time      10/07/24  Nustep seat 10 all four extremities L5x8 minutes   Hip flexor stretch off edge of mat table 2x30 seconds Figure four stretch 2x30 seconds B Hooklying HS 2x30 seconds B with sheet Lumbar rotation stretch 5x5 seconds B SKTC 5x5 seconds B   Roller R HS and lateral thigh    PATIENT EDUCATION:  Education details: exercise progression/ modification, glute med repair protocol, reassessment findings, POC, use of SPC Person educated: Patient Education method: Explanation, Demonstration,  Education comprehension: verbalized understanding, returned demonstration, verbal cues required, and tactile cues required  HOME EXERCISE PROGRAM: POST OP GLUTE Access Code: [email or web address] URL: [email or web address] Date: 10/28/2024 Prepared by: Prentice Kaimana Lurz  Exercises - Hooklying Gluteal Sets  - 3 x daily - 7 x weekly - 10 reps - 5-10 second hold - Supine Hip Adduction Isometric with Ball  - 1 x daily - 7 x weekly - 10 reps - 5-10 second hold - Prone Knee Flexion (Mirrored)  - 3 x daily - 7 x weekly - 3 sets - 10 reps - Abdominal Bracing  - 3 x daily - 7 x weekly - 10 reps - 5-10 second hold - Seated Long Arc Quad  - 3 x daily - 7 x weekly - 2 sets - 10 reps - 5-10 second hold   Access Code: [email or web address] URL: [email or web address] Date:  07/07/2024 Prepared by: Prentice Luciel Brickman  Exercises - Supine Bridge  - 1 x daily - 7 x weekly - 3 sets - 10 reps - Clamshell (Mirrored)  - 1 x daily - 7 x weekly - 3 sets - 10 reps  Access Code: [email or web address] URL: [email or web address] Date: 09/27/2024 Prepared by: Frankie Ziemba  Exercises - Stool Scoots  - 1 x daily - 1-3 x weekly - Noodle press  - 1 x daily - 1-3 x weekly - 1-3 sets - 10 reps - Noodle Stomp  - 1 x daily - 1-3 x weekly - 1-2 sets - 10 reps - Standing Balance on Noodle at [employer]  - 1 x daily - 1-3 x weekly - 1-3 reps - 20 hold - Squat on Noodle  - 1 x daily - 1-3 x weekly - 1-3 sets - 10 reps - Plank on Long Hand Float with Leg Lift  - 1 x daily - 1-3 x weekly - 1-2 sets - 10 reps - Flutter Kicking/Windshield Wipers  - 1 x daily - 1-3 x weekly - 3 sets - 10 reps -  Runner's Step Up/Down  - 1 x daily - 1-3 x weekly - 1-3 sets - 10 reps - arm swing resisted with hand bells  - 1 x daily - 1-3 x weekly - 3 sets - 5-10 reps ASSESSMENT:  CLINICAL IMPRESSION: Began session on bike for dynamic warm up. Patient with tender and hyperactive hip flexor, adductor, glutes and TFL, which improves with manual. Continued with progression through glute repair protocol and progressed into gentle functional strengthening which is tolerated and performed well. Patient will continue to benefit from physical therapy in order to improve function and reduce impairment.     OBJECTIVE IMPAIRMENTS: Abnormal gait, decreased activity tolerance, decreased balance, decreased endurance, decreased mobility, difficulty walking, decreased ROM, decreased strength, increased muscle spasms, impaired flexibility, improper body mechanics, and pain  ACTIVITY LIMITATIONS: lifting, bending, standing, squatting, stairs, transfers, locomotion level, and caring for others  PARTICIPATION LIMITATIONS: meal prep, cleaning, laundry, shopping, community activity, occupation, and yard work  PERSONAL  FACTORS: Fitness and 3+ comorbidities: Hx R THA, R TKA, hx LBP, HTN are also affecting patient's functional outcome.   REHAB POTENTIAL: Good  CLINICAL DECISION MAKING: Evolving/moderate complexity  EVALUATION COMPLEXITY: Moderate   GOALS: Goals reviewed with patient? Yes  SHORT TERM GOALS: Target date: 08/04/2024    Patient will be independent with HEP in order to improve functional outcomes. Baseline: Goal status: Met 08/03/23  2.  Patient will report at least 25% improvement in symptoms/functional status for improved quality of life. Baseline: Goal status: Met 09/02/24   LONG TERM GOALS: Target date: 09/01/2024  Patient will report at least 75% improvement in symptoms for improved quality of life. Baseline:  Goal status: Progressing 09/02/24 (40%)  2.  Patient will improve LEFS  score by at least 15 points in order to indicate improved tolerance to activity. Baseline:  Goal status: Progressing 09/02/24; progressing 10/04/24  3.  Patient will be able to navigate stairs with reciprocal pattern without compensation in order to demonstrate improved LE strength. Baseline:  Goal status: Progressing 09/02/24  4. Patient will demonstrate grade of 5/5 MMT grade in all tested musculature as evidence of improved strength to assist with stair ambulation and gait. Baseline:  Goal status: Progressing 09/02/24; Partially met (LLE)/ progressing R 10/04/24  5.  Patient will be able to complete 5x STS in under 15 seconds in order to reduce the risk of falls. Baseline:  Goal status: Progressing 09/02/24  6.   SLS 30s without compensation for improved gait Baseline:  Goal status: New PLAN:  PT FREQUENCY: 1-2x/week  PT DURATION: 12 weeks  PLANNED INTERVENTIONS: [id number]- PT Re-evaluation, 97110-Therapeutic exercises, 97530- Therapeutic activity, [id number]- Neuromuscular re-education, 97535- Self Care, [id number]- Manual therapy, [id number]- Gait training, [phone]- Orthotic Fit/training, [phone]-  Canalith repositioning, [id number]- Aquatic Therapy, [phone]- Splinting, [phone]- Wound care (first 20 sq cm), 97598- Wound care (each additional 20 sq cm)Patient/Family education, Balance training, Stair training, Taping, Dry Needling, Joint mobilization, Joint manipulation, Spinal manipulation, Spinal mobilization, Scar mobilization, and DME instructions.  PLAN FOR NEXT SESSION: progress with hip gluteus medius tear repair   Prentice RAMAN Shaila Gilchrest, PT, DPT 11/24/2024, 10:12 AM     "

## 2024-11-29 ENCOUNTER — Ambulatory Visit (HOSPITAL_BASED_OUTPATIENT_CLINIC_OR_DEPARTMENT_OTHER): Admitting: Physical Therapy

## 2024-11-29 ENCOUNTER — Encounter (HOSPITAL_BASED_OUTPATIENT_CLINIC_OR_DEPARTMENT_OTHER): Payer: Self-pay | Admitting: Physical Therapy

## 2024-11-29 DIAGNOSIS — R29898 Other symptoms and signs involving the musculoskeletal system: Secondary | ICD-10-CM | POA: Diagnosis not present

## 2024-11-29 DIAGNOSIS — M25551 Pain in right hip: Secondary | ICD-10-CM

## 2024-11-29 DIAGNOSIS — R2689 Other abnormalities of gait and mobility: Secondary | ICD-10-CM

## 2024-11-29 DIAGNOSIS — M6281 Muscle weakness (generalized): Secondary | ICD-10-CM

## 2024-11-29 NOTE — Therapy (Signed)
 " OUTPATIENT PHYSICAL THERAPY TREATMENT    Patient Name: Reginald [name] MRN: [id number] DOB:03/12/1966, 59 y.o., male Today's Date: 11/29/2024    END OF SESSION:  PT End of Session - 11/29/24 0933     Visit Number 29    Number of Visits 56    Date for Recertification  01/20/25    Authorization Type [employer]    Progress Note Due on Visit 34    PT Start Time 0933    PT Stop Time 1013    PT Time Calculation (min) 40 min    Activity Tolerance Patient tolerated treatment well    Behavior During Therapy [facility] for tasks assessed/performed          Past Medical History:  Diagnosis Date   Arthritis    Gallstones    Hypertension    Past Surgical History:  Procedure Laterality Date   CHOLECYSTECTOMY  12/03/2011   Procedure: LAPAROSCOPIC CHOLECYSTECTOMY WITH INTRAOPERATIVE CHOLANGIOGRAM;  Surgeon: Camellia CHRISTELLA Blush, MD;  Location: [facility] OR;  Service: General;  Laterality: N/A;  laparoscopic cholecystectomy with intraoperative cholangiogram   CHONDROPLASTY Right 09/19/2015   Procedure: CHONDROPLASTY;  Surgeon: Norleen Gavel, MD;  Location: [facility];  Service: Orthopedics;  Laterality: Right;   ENDOVENOUS ABLATION SAPHENOUS VEIN W/ LASER Right 01/13/2019   endovenous laser ablation right greater saphenous vein and stab phlebectomy 10-20 incisions right leg by Lonni Blade MD    IR ABLATE LIVER CRYOABLATION  04/03/2020   IR RADIOLOGIST EVAL & MGMT  03/28/2020   KNEE ARTHROSCOPY WITH LATERAL MENISECTOMY Right 09/19/2015   Procedure: KNEE ARTHROSCOPY WITH PARTIAL LATERAL MENISECTOMY;  Surgeon: Norleen Gavel, MD;  Location: [facility];  Service: Orthopedics;  Laterality: Right;   KNEE ARTHROSCOPY WITH MEDIAL MENISECTOMY Right 09/19/2015   Procedure: KNEE ARTHROSCOPY WITH PARTIAL MEDIAL MENISECTOMY;  Surgeon: Norleen Gavel, MD;  Location: [facility];  Service: Orthopedics;  Laterality: Right;   OPEN SURGICAL REPAIR OF GLUTEAL TENDON Right  10/24/2024   Procedure: REPAIR, TENDON, GLUTEUS MEDIUS, ENDOSCOPIC;  Surgeon: Genelle Standing, MD;  Location: [facility];  Service: Orthopedics;  Laterality: Right;  RIGHT HIP ENDOSCOPIC GLUTEUS MEDIUS REPAIR   ROTATOR CUFF REPAIR Left    with revision   spider bite     black widow or brown recluse   TOE FUSION Left    TOTAL HIP ARTHROPLASTY Right 06/15/2023   Procedure: TOTAL HIP ARTHROPLASTY ANTERIOR APPROACH;  Surgeon: Gavel Norleen, MD;  Location: [facility];  Service: Orthopedics;  Laterality: Right;   TOTAL KNEE ARTHROPLASTY Right 03/31/2018   Procedure: RIGHT TOTAL KNEE ARTHROPLASTY;  Surgeon: Gavel Norleen, MD;  Location: [facility];  Service: Orthopedics;  Laterality: Right;   TOTAL KNEE REVISION Right 11/04/2019   Procedure: RIGHT TOTAL KNEE REVISION;  Surgeon: Gavel Norleen, MD;  Location: [facility];  Service: Orthopedics;  Laterality: Right;   TRIGGER FINGER RELEASE Right    Patient Active Problem List   Diagnosis Date Noted   Tear of right gluteus medius tendon 10/24/2024   Primary osteoarthritis of right hip 06/14/2023   Meralgia paraesthetica, right 06/10/2023   Spondylosis without myelopathy or radiculopathy, lumbar region 06/10/2023   Painful total knee replacement, right 11/04/2019   Arthrofibrosis of knee joint, right 11/04/2019   S/P revision of total knee, right 11/04/2019   Primary osteoarthritis of right knee 03/31/2018   Hypertension 12/02/2011   Obesity (BMI 30-39.9) 12/02/2011    PCP: [facility]  REFERRING PROVIDER: Gavel Norleen, MD/  glute repair Genelle Standing, MD  REFERRING DIAG: S/P right hip trochanteric bursectomy and IT band release 10/28/24 - S76.011A (ICD-10-CM) - Tear of right gluteus medius tendon, initial encounter  PROCEDURE: 1. Right hip gluteus medius tear repair 2. Right hip trochanteric bursectomy DOS 10/24/24  THERAPY DIAG:  Other symptoms and signs involving the musculoskeletal system  Pain in right hip  Other  abnormalities of gait and mobility  Muscle weakness (generalized)  Rationale for Evaluation and Treatment: Rehabilitation  ONSET DATE: 3 weeks ago; Glute repair 10/24/24  SUBJECTIVE:   SUBJECTIVE STATEMENT:  Patient states hip feeling alright, just sore at inner part.  PERTINENT HISTORY: Hx R THA, R TKA, hx LBP, HTN,   PAIN:  Are you having pain? Yes: NPRS scale: 5/10 Pain location: R hip  Pain description: aching, sharp L knee; everywhere else is more an ache, R hip feels like it has a pressure on it  Aggravating factors: not sure  Relieving factors: nothing/time and rest   PRECAUTIONS: None  WEIGHT BEARING RESTRICTIONS: No  FALLS:  Has patient fallen in last 6 months? No  PLOF: Independent  PATIENT GOALS:  a little bit of relief  OBJECTIVE: (objective measures from initial evaluation unless otherwise dated) OBSERVATION:  10/28/24: sutures intact, no s/s of infection   MRI 8/25 right hip  IMPRESSION: 1. Right total hip arthroplasty with susceptibility artifact partially obscuring the adjacent soft tissue and osseous structures. No periarticular fluid collection or osteolysis. 2. Large amount of fluid in the right greater trochanteric bursa consistent with bursitis. 3. Mild osteoarthritis of the left hip.  PATIENT SURVEYS:  LEFS  Extreme difficulty/unable (0), Quite a bit of difficulty (1), Moderate difficulty (2), Little difficulty (3), No difficulty (4) Survey date:  07/07/24 09/02/24 10/04/24 10/28/24  Any of your usual work, housework or school activities 1 1  2   2. Usual hobbies, recreational or sporting activities 1 2  1   3. Getting into/out of the bath 1 1  1   4. Walking between rooms 2 2  2   5. Putting on socks/shoes 1 1  1   6. Squatting  2 1  1   7. Lifting an object, like a bag of groceries from the floor 2 2  1   8. Performing light activities around your home 2 2  1   9. Performing heavy activities around your home 0 0  0  10. Getting into/out of a  car 2 1  1   11. Walking 2 blocks 0 1  1  12. Walking 1 mile 0 0  0  13. Going up/down 10 stairs (1 flight) 1 1  1   14. Standing for 1 hour 1 1  1   15.  sitting for 1 hour 2 2  2   16. Running on even ground 0 0  0  17. Running on uneven ground 0 0  0  18. Making sharp turns while running fast 0 0  0  19. Hopping  0 0  0  20. Rolling over in bed 1 1  1   Score total:  19/80 19/80 23/80  17/80     COGNITION: Overall cognitive status: Within functional limits for tasks assessed     SENSATION: WFL  POSTURE: No Significant postural limitations  PALPATION: TTP R greater Troch   LOWER EXTREMITY ROM: 10/28/24: WFL for tasks assessed  Active ROM Right eval Left eval Right 10/28/24 Left 10/28/24  Hip flexion   90   Hip extension      Hip abduction  Hip adduction      Hip internal rotation      Hip external rotation      Knee flexion      Knee extension      Ankle dorsiflexion      Ankle plantarflexion      Ankle inversion      Ankle eversion       (Blank rows = not tested) *= pain/symptoms  LOWER EXTREMITY MMT: 10/28/24: not fully assessed due to post op status  MMT Right eval Left eval Right 09/02/24 Left 09/02/24 Right  10/04/24 Right 10/28/24 Left 10/28/24  Hip flexion 4+ 5 4+ 5 4+ -5- 4+ 4+  Hip extension 4 4+ 4+ 5 4+ -5-    Hip abduction (sidelying) 4-  4+ 5 4+    Hip adduction         Hip internal rotation         Hip external rotation         Knee flexion 5 5 5 5  5 5   Knee extension 5 5 5 5  5 5   Ankle dorsiflexion         Ankle plantarflexion         Ankle inversion         Ankle eversion          (Blank rows = not tested) *= pain/symptoms  FUNCTIONAL TESTS:  5 times sit to stand: 26.97 seconds  Stairs: 7 inch, step too pattern, uses LLE 5xSTS 09/02/24: 17.38 without use of Ues  10/28/24: not fully assessed due to post op status  GAIT: Distance walked: 100 feet  Assistive device utilized: None Level of assistance: Complete  Independence Comments: antalgic on RLE with truncal lean to R  GAIT: 10/28/24 Distance walked: 100 feet  Assistive device utilized: None Level of assistance: Complete Independence Comments: antalgic on RLE with truncal lean to R  TODAY'S TREATMENT:     11/29/24 Bike 5 minutes  Manual: STM TFL, adductors, glutes, and hip flexor Supine clam GTB 3 x 10 Prone hip extension 3# 3 x 10 Standing hip abduction 2 x 10 Standing hip extension 2 x 10 Mini squat 2 x 10 Leg press 130# 1 x 10 (RPE 3/10), 150# 2 x 10 (RPE 5-6/10) SLS with intermittent UE support 3 x 20-30 second holds Chair squat 2 x 10   11/24/24 Bike 5 minutes  Manual: STM TFL, adductors, glutes, and hip flexor; lateral distraction with belt Supine hip abduction isometrics with belt 2 x 10 with 5-10 second holds Prone hip extension 2 x 10 Prone hip extension with knee bent 2 x 10 STS from elevated table 2 x 10 SLS with UE support 3 x 20-30 second holds Standing hip adductor stretch with OH reach 5 x 10 second holds  11/22/24 Bike 5 minutes  Manual: STM TFL, adductors, and hip flexor; lateral distraction with belt Supine hip adductor stretch 5 x 10 second holds Supine hip abduction isometrics with belt 2 x 10 with 5-10 second holds Supine gentle hip flexor isometric 10 x 5-10 second holds Bridge 10# 3 x 10 Prone hip extension 2 x 10   11/08/24 STM TFL and hip flexor Education on gait mechanics Bike 5 minutes  Hooklying glute set 5 x 5 second holds, with small lift 2 x 10 Prone glute sets 2 x 10 with 5-10 second holds Quadruped rocking for hip flexion 2 x 10 Standing weight shifts with glute set 2 x 10 Standing glute set  with LLE on step and overhead lift 5# 2 x 10   11/04/24 Nustep level 5, 5 minutes for dynamic warm up Hip adduction isometric 10 x 5-10 second holds TA activation with small bent knee fall out 2 x 10 SAQ 2# 2 x 10 with 5 second holds Prone glute sets 2 x 10 with 5-10 second holds Prone hamstring  curl 5# 2 x 10 LAQ 5# 2 x 10     PATIENT EDUCATION:  Education details: exercise progression/ modification, glute med repair protocol, reassessment findings, POC, use of SPC Person educated: Patient Education method: Explanation, Demonstration,  Education comprehension: verbalized understanding, returned demonstration, verbal cues required, and tactile cues required  HOME EXERCISE PROGRAM: POST OP GLUTE Access Code: [email or web address] URL: [email or web address] Date: 10/28/2024 Prepared by: Prentice Saatvik Thielman  Exercises - Hooklying Gluteal Sets  - 3 x daily - 7 x weekly - 10 reps - 5-10 second hold - Supine Hip Adduction Isometric with Ball  - 1 x daily - 7 x weekly - 10 reps - 5-10 second hold - Prone Knee Flexion (Mirrored)  - 3 x daily - 7 x weekly - 3 sets - 10 reps - Abdominal Bracing  - 3 x daily - 7 x weekly - 10 reps - 5-10 second hold - Seated Long Arc Quad  - 3 x daily - 7 x weekly - 2 sets - 10 reps - 5-10 second hold   Access Code: [email or web address] URL: [email or web address] Date: 07/07/2024 Prepared by: Prentice Clarissia Mckeen  Exercises - Supine Bridge  - 1 x daily - 7 x weekly - 3 sets - 10 reps - Clamshell (Mirrored)  - 1 x daily - 7 x weekly - 3 sets - 10 reps  Access Code: [email or web address] URL: [email or web address] Date: 09/27/2024 Prepared by: Frankie Ziemba  Exercises - Stool Scoots  - 1 x daily - 1-3 x weekly - Noodle press  - 1 x daily - 1-3 x weekly - 1-3 sets - 10 reps - Noodle Stomp  - 1 x daily - 1-3 x weekly - 1-2 sets - 10 reps - Standing Balance on Noodle at [employer]  - 1 x daily - 1-3 x weekly - 1-3 reps - 20 hold - Squat on Noodle  - 1 x daily - 1-3 x weekly - 1-3 sets - 10 reps - Plank on Long Hand Float with Leg Lift  - 1 x daily - 1-3 x weekly - 1-2 sets - 10 reps - Flutter Kicking/Windshield Wipers  - 1 x daily - 1-3 x weekly - 3 sets - 10 reps - Runner's Step Up/Down  - 1 x daily - 1-3 x weekly - 1-3 sets - 10 reps - arm  swing resisted with hand bells  - 1 x daily - 1-3 x weekly - 3 sets - 5-10 reps ASSESSMENT:  CLINICAL IMPRESSION: Began session on bike for dynamic warm up. Patient with tender and hyperactive hip flexor, adductor, glutes and TFL, which improves with manual. Began light glute strengthening which is tolerated well. Patient will continue to benefit from physical therapy in order to improve function and reduce impairment.     OBJECTIVE IMPAIRMENTS: Abnormal gait, decreased activity tolerance, decreased balance, decreased endurance, decreased mobility, difficulty walking, decreased ROM, decreased strength, increased muscle spasms, impaired flexibility, improper body mechanics, and pain  ACTIVITY LIMITATIONS: lifting, bending, standing, squatting, stairs, transfers, locomotion level, and caring for others  PARTICIPATION LIMITATIONS: meal prep, cleaning, laundry, shopping, community activity, occupation, and yard work  PERSONAL FACTORS: Fitness  and 3+ comorbidities: Hx R THA, R TKA, hx LBP, HTN are also affecting patient's functional outcome.   REHAB POTENTIAL: Good  CLINICAL DECISION MAKING: Evolving/moderate complexity  EVALUATION COMPLEXITY: Moderate   GOALS: Goals reviewed with patient? Yes  SHORT TERM GOALS: Target date: 08/04/2024    Patient will be independent with HEP in order to improve functional outcomes. Baseline: Goal status: Met 08/03/23  2.  Patient will report at least 25% improvement in symptoms/functional status for improved quality of life. Baseline: Goal status: Met 09/02/24   LONG TERM GOALS: Target date: 09/01/2024  Patient will report at least 75% improvement in symptoms for improved quality of life. Baseline:  Goal status: Progressing 09/02/24 (40%)  2.  Patient will improve LEFS  score by at least 15 points in order to indicate improved tolerance to activity. Baseline:  Goal status: Progressing 09/02/24; progressing 10/04/24  3.  Patient will be able  to navigate stairs with reciprocal pattern without compensation in order to demonstrate improved LE strength. Baseline:  Goal status: Progressing 09/02/24  4. Patient will demonstrate grade of 5/5 MMT grade in all tested musculature as evidence of improved strength to assist with stair ambulation and gait. Baseline:  Goal status: Progressing 09/02/24; Partially met (LLE)/ progressing R 10/04/24  5.  Patient will be able to complete 5x STS in under 15 seconds in order to reduce the risk of falls. Baseline:  Goal status: Progressing 09/02/24  6.   SLS 30s without compensation for improved gait Baseline:  Goal status: New PLAN:  PT FREQUENCY: 1-2x/week  PT DURATION: 12 weeks  PLANNED INTERVENTIONS: [id number]- PT Re-evaluation, 97110-Therapeutic exercises, 97530- Therapeutic activity, [id number]- Neuromuscular re-education, 97535- Self Care, [id number]- Manual therapy, [id number]- Gait training, [phone]- Orthotic Fit/training, [phone]- Canalith repositioning, [id number]- Aquatic Therapy, [phone]- Splinting, [phone]- Wound care (first 20 sq cm), 97598- Wound care (each additional 20 sq cm)Patient/Family education, Balance training, Stair training, Taping, Dry Needling, Joint mobilization, Joint manipulation, Spinal manipulation, Spinal mobilization, Scar mobilization, and DME instructions.  PLAN FOR NEXT SESSION: progress with hip gluteus medius tear repair   Prentice GORMAN Stains, PT, DPT 11/29/2024, 10:13 AM     "

## 2024-12-01 ENCOUNTER — Ambulatory Visit (HOSPITAL_BASED_OUTPATIENT_CLINIC_OR_DEPARTMENT_OTHER): Admitting: Physical Therapy

## 2024-12-06 ENCOUNTER — Ambulatory Visit (HOSPITAL_BASED_OUTPATIENT_CLINIC_OR_DEPARTMENT_OTHER): Admitting: Physical Therapy

## 2024-12-06 ENCOUNTER — Encounter (HOSPITAL_BASED_OUTPATIENT_CLINIC_OR_DEPARTMENT_OTHER): Payer: Self-pay | Admitting: Physical Therapy

## 2024-12-06 DIAGNOSIS — R29898 Other symptoms and signs involving the musculoskeletal system: Secondary | ICD-10-CM

## 2024-12-06 DIAGNOSIS — M25551 Pain in right hip: Secondary | ICD-10-CM

## 2024-12-06 DIAGNOSIS — M6281 Muscle weakness (generalized): Secondary | ICD-10-CM

## 2024-12-06 DIAGNOSIS — R2689 Other abnormalities of gait and mobility: Secondary | ICD-10-CM

## 2024-12-06 NOTE — Therapy (Signed)
 " OUTPATIENT PHYSICAL THERAPY TREATMENT    Patient Name: Reginald [name] MRN: [id number] DOB:November 26, 1965, 59 y.o., male Today's Date: 12/06/2024  Progress Note   Reporting Period 10/28/24 to 12/06/24   See note below for Objective Data and Assessment of Progress/Goals   END OF SESSION:  PT End of Session - 12/06/24 0849     Visit Number 34    Number of Visits 56    Date for Recertification  01/20/25    Authorization Type [employer]    Progress Note Due on Visit 34    PT Start Time 0847    PT Stop Time 0926    PT Time Calculation (min) 39 min    Activity Tolerance Patient tolerated treatment well    Behavior During Therapy [facility] for tasks assessed/performed          Past Medical History:  Diagnosis Date   Arthritis    Gallstones    Hypertension    Past Surgical History:  Procedure Laterality Date   CHOLECYSTECTOMY  12/03/2011   Procedure: LAPAROSCOPIC CHOLECYSTECTOMY WITH INTRAOPERATIVE CHOLANGIOGRAM;  Surgeon: Camellia CHRISTELLA Blush, MD;  Location: [facility] OR;  Service: General;  Laterality: N/A;  laparoscopic cholecystectomy with intraoperative cholangiogram   CHONDROPLASTY Right 09/19/2015   Procedure: CHONDROPLASTY;  Surgeon: Norleen Gavel, MD;  Location: [facility];  Service: Orthopedics;  Laterality: Right;   ENDOVENOUS ABLATION SAPHENOUS VEIN W/ LASER Right 01/13/2019   endovenous laser ablation right greater saphenous vein and stab phlebectomy 10-20 incisions right leg by Lonni Blade MD    IR ABLATE LIVER CRYOABLATION  04/03/2020   IR RADIOLOGIST EVAL & MGMT  03/28/2020   KNEE ARTHROSCOPY WITH LATERAL MENISECTOMY Right 09/19/2015   Procedure: KNEE ARTHROSCOPY WITH PARTIAL LATERAL MENISECTOMY;  Surgeon: Norleen Gavel, MD;  Location: [facility];  Service: Orthopedics;  Laterality: Right;   KNEE ARTHROSCOPY WITH MEDIAL MENISECTOMY Right 09/19/2015   Procedure: KNEE ARTHROSCOPY WITH PARTIAL MEDIAL MENISECTOMY;  Surgeon: Norleen Gavel, MD;   Location: [facility];  Service: Orthopedics;  Laterality: Right;   OPEN SURGICAL REPAIR OF GLUTEAL TENDON Right 10/24/2024   Procedure: REPAIR, TENDON, GLUTEUS MEDIUS, ENDOSCOPIC;  Surgeon: Genelle Standing, MD;  Location: [facility];  Service: Orthopedics;  Laterality: Right;  RIGHT HIP ENDOSCOPIC GLUTEUS MEDIUS REPAIR   ROTATOR CUFF REPAIR Left    with revision   spider bite     black widow or brown recluse   TOE FUSION Left    TOTAL HIP ARTHROPLASTY Right 06/15/2023   Procedure: TOTAL HIP ARTHROPLASTY ANTERIOR APPROACH;  Surgeon: Gavel Norleen, MD;  Location: [facility];  Service: Orthopedics;  Laterality: Right;   TOTAL KNEE ARTHROPLASTY Right 03/31/2018   Procedure: RIGHT TOTAL KNEE ARTHROPLASTY;  Surgeon: Gavel Norleen, MD;  Location: [facility];  Service: Orthopedics;  Laterality: Right;   TOTAL KNEE REVISION Right 11/04/2019   Procedure: RIGHT TOTAL KNEE REVISION;  Surgeon: Gavel Norleen, MD;  Location: [facility];  Service: Orthopedics;  Laterality: Right;   TRIGGER FINGER RELEASE Right    Patient Active Problem List   Diagnosis Date Noted   Tear of right gluteus medius tendon 10/24/2024   Primary osteoarthritis of right hip 06/14/2023   Meralgia paraesthetica, right 06/10/2023   Spondylosis without myelopathy or radiculopathy, lumbar region 06/10/2023   Painful total knee replacement, right 11/04/2019   Arthrofibrosis of knee joint, right 11/04/2019   S/P revision of total knee, right 11/04/2019   Primary osteoarthritis of right knee 03/31/2018   Hypertension  12/02/2011   Obesity (BMI 30-39.9) 12/02/2011    PCP: [facility]  REFERRING PROVIDER: Yvone Rush, MD/ glute repair Genelle Standing, MD  REFERRING DIAG: S/P right hip trochanteric bursectomy and IT band release 10/28/24 - S76.011A (ICD-10-CM) - Tear of right gluteus medius tendon, initial encounter  PROCEDURE: 1. Right hip gluteus medius tear repair 2. Right hip trochanteric  bursectomy DOS 10/24/24  THERAPY DIAG:  Other symptoms and signs involving the musculoskeletal system  Pain in right hip  Other abnormalities of gait and mobility  Muscle weakness (generalized)  Rationale for Evaluation and Treatment: Rehabilitation  ONSET DATE: 3 weeks ago; Glute repair 10/24/24  SUBJECTIVE:   SUBJECTIVE STATEMENT:  Patient states overall doing well, felt alright after last time. Sore on inner thigh muscles. About 50% improvement in symptoms/function.  PERTINENT HISTORY: Hx R THA, R TKA, hx LBP, HTN,   PAIN:  Are you having pain? Yes: NPRS scale: 5/10 Pain location: R hip  Pain description: aching, sharp L knee; everywhere else is more an ache, R hip feels like it has a pressure on it  Aggravating factors: not sure  Relieving factors: nothing/time and rest   PRECAUTIONS: None  WEIGHT BEARING RESTRICTIONS: No  FALLS:  Has patient fallen in last 6 months? No  PLOF: Independent  PATIENT GOALS:  a little bit of relief  OBJECTIVE: (objective measures from initial evaluation unless otherwise dated) OBSERVATION:  10/28/24: sutures intact, no s/s of infection   MRI 8/25 right hip  IMPRESSION: 1. Right total hip arthroplasty with susceptibility artifact partially obscuring the adjacent soft tissue and osseous structures. No periarticular fluid collection or osteolysis. 2. Large amount of fluid in the right greater trochanteric bursa consistent with bursitis. 3. Mild osteoarthritis of the left hip.  PATIENT SURVEYS:  LEFS  Extreme difficulty/unable (0), Quite a bit of difficulty (1), Moderate difficulty (2), Little difficulty (3), No difficulty (4) Survey date:  07/07/24 09/02/24 10/04/24 10/28/24 12/08/24  Any of your usual work, housework or school activities 1 1  2 2   2. Usual hobbies, recreational or sporting activities 1 2  1 2   3. Getting into/out of the bath 1 1  1 1   4. Walking between rooms 2 2  2 2   5. Putting on socks/shoes 1 1  1 1   6.  Squatting  2 1  1 1   7. Lifting an object, like a bag of groceries from the floor 2 2  1 2   8. Performing light activities around your home 2 2  1 2   9. Performing heavy activities around your home 0 0  0 1  10. Getting into/out of a car 2 1  1 2   11. Walking 2 blocks 0 1  1 2   12. Walking 1 mile 0 0  0 0  13. Going up/down 10 stairs (1 flight) 1 1  1 1   14. Standing for 1 hour 1 1  1 1   15.  sitting for 1 hour 2 2  2 2   16. Running on even ground 0 0  0 0  17. Running on uneven ground 0 0  0 0  18. Making sharp turns while running fast 0 0  0 0  19. Hopping  0 0  0 0  20. Rolling over in bed 1 1  1 3   Score total:  19/80 19/80 23/80  17/80 25/80     COGNITION: Overall cognitive status: Within functional limits for tasks assessed     SENSATION:  WFL  POSTURE: No Significant postural limitations  PALPATION: TTP R greater Troch   LOWER EXTREMITY ROM: 10/28/24: WFL for tasks assessed  Active ROM Right eval Left eval Right 10/28/24 Left 10/28/24  Hip flexion   90   Hip extension      Hip abduction      Hip adduction      Hip internal rotation      Hip external rotation      Knee flexion      Knee extension      Ankle dorsiflexion      Ankle plantarflexion      Ankle inversion      Ankle eversion       (Blank rows = not tested) *= pain/symptoms  LOWER EXTREMITY MMT: 10/28/24: not fully assessed due to post op status  MMT Right eval Left eval Right 09/02/24 Left 09/02/24 Right  10/04/24 Right 10/28/24 Left 10/28/24  Hip flexion 4+ 5 4+ 5 4+ -5- 4+ 4+  Hip extension 4 4+ 4+ 5 4+ -5-    Hip abduction (sidelying) 4-  4+ 5 4+    Hip adduction         Hip internal rotation         Hip external rotation         Knee flexion 5 5 5 5  5 5   Knee extension 5 5 5 5  5 5   Ankle dorsiflexion         Ankle plantarflexion         Ankle inversion         Ankle eversion          (Blank rows = not tested) *= pain/symptoms  FUNCTIONAL TESTS:  5 times sit to stand: 26.97  seconds  Stairs: 7 inch, step too pattern, uses LLE 5xSTS 09/02/24: 17.38 without use of Ues  10/28/24: not fully assessed due to post op status  GAIT: Distance walked: 100 feet  Assistive device utilized: None Level of assistance: Complete Independence Comments: antalgic on RLE with truncal lean to R  GAIT: 10/28/24 Distance walked: 100 feet  Assistive device utilized: None Level of assistance: Complete Independence Comments: antalgic on RLE with truncal lean to R  TODAY'S TREATMENT:     12/06/24 Bike 5 minutes  Manual: STM TFL, adductors, glutes, and hip flexor Standing hip abduction 3 x 10 Standing hip extension 3 x 10 Step up 4 inch 2 x 10 Lateral step up 4 inch 2 x 10 Lateral stepping 8 x 12 feet Hip rotation on stool 3 x 10   11/29/24 Bike 5 minutes  Manual: STM TFL, adductors, glutes, and hip flexor Supine clam GTB 3 x 10 Prone hip extension 3# 3 x 10 Standing hip abduction 2 x 10 Standing hip extension 2 x 10 Mini squat 2 x 10 Leg press 130# 1 x 10 (RPE 3/10), 150# 2 x 10 (RPE 5-6/10) SLS with intermittent UE support 3 x 20-30 second holds Chair squat 2 x 10   11/24/24 Bike 5 minutes  Manual: STM TFL, adductors, glutes, and hip flexor; lateral distraction with belt Supine hip abduction isometrics with belt 2 x 10 with 5-10 second holds Prone hip extension 2 x 10 Prone hip extension with knee bent 2 x 10 STS from elevated table 2 x 10 SLS with UE support 3 x 20-30 second holds Standing hip adductor stretch with OH reach 5 x 10 second holds  11/22/24 Bike 5 minutes  Manual: STM TFL,  adductors, and hip flexor; lateral distraction with belt Supine hip adductor stretch 5 x 10 second holds Supine hip abduction isometrics with belt 2 x 10 with 5-10 second holds Supine gentle hip flexor isometric 10 x 5-10 second holds Bridge 10# 3 x 10 Prone hip extension 2 x 10   11/08/24 STM TFL and hip flexor Education on gait mechanics Bike 5 minutes  Hooklying glute  set 5 x 5 second holds, with small lift 2 x 10 Prone glute sets 2 x 10 with 5-10 second holds Quadruped rocking for hip flexion 2 x 10 Standing weight shifts with glute set 2 x 10 Standing glute set with LLE on step and overhead lift 5# 2 x 10   11/04/24 Nustep level 5, 5 minutes for dynamic warm up Hip adduction isometric 10 x 5-10 second holds TA activation with small bent knee fall out 2 x 10 SAQ 2# 2 x 10 with 5 second holds Prone glute sets 2 x 10 with 5-10 second holds Prone hamstring curl 5# 2 x 10 LAQ 5# 2 x 10     PATIENT EDUCATION:  Education details: exercise progression/ modification, glute med repair protocol, reassessment findings, POC, use of SPC Person educated: Patient Education method: Explanation, Demonstration,  Education comprehension: verbalized understanding, returned demonstration, verbal cues required, and tactile cues required  HOME EXERCISE PROGRAM: POST OP GLUTE Access Code: [email or web address] URL: [email or web address] Date: 10/28/2024 Prepared by: Prentice Witt Plitt  Exercises - Hooklying Gluteal Sets  - 3 x daily - 7 x weekly - 10 reps - 5-10 second hold - Supine Hip Adduction Isometric with Ball  - 1 x daily - 7 x weekly - 10 reps - 5-10 second hold - Prone Knee Flexion (Mirrored)  - 3 x daily - 7 x weekly - 3 sets - 10 reps - Abdominal Bracing  - 3 x daily - 7 x weekly - 10 reps - 5-10 second hold - Seated Long Arc Quad  - 3 x daily - 7 x weekly - 2 sets - 10 reps - 5-10 second hold   Access Code: [email or web address] URL: [email or web address] Date: 07/07/2024 Prepared by: Prentice Markella Dao  Exercises - Supine Bridge  - 1 x daily - 7 x weekly - 3 sets - 10 reps - Clamshell (Mirrored)  - 1 x daily - 7 x weekly - 3 sets - 10 reps  Access Code: [email or web address] URL: [email or web address] Date: 09/27/2024 Prepared by: Frankie Ziemba  Exercises - Stool Scoots  - 1 x daily - 1-3 x weekly - Noodle press  - 1 x daily - 1-3 x  weekly - 1-3 sets - 10 reps - Noodle Stomp  - 1 x daily - 1-3 x weekly - 1-2 sets - 10 reps - Standing Balance on Noodle at [employer]  - 1 x daily - 1-3 x weekly - 1-3 reps - 20 hold - Squat on Noodle  - 1 x daily - 1-3 x weekly - 1-3 sets - 10 reps - Plank on Long Hand Float with Leg Lift  - 1 x daily - 1-3 x weekly - 1-2 sets - 10 reps - Flutter Kicking/Windshield Wipers  - 1 x daily - 1-3 x weekly - 3 sets - 10 reps - Runner's Step Up/Down  - 1 x daily - 1-3 x weekly - 1-3 sets - 10 reps - arm swing resisted with hand bells  - 1 x daily - 1-3 x weekly - 3 sets - 5-10 reps ASSESSMENT:  CLINICAL IMPRESSION: Began session  on bike for dynamic warm up. Patient with tender and hyperactive R hip flexor and adductors but much improved from last session. Continued to progress into further glute and functional strengthening. Continued bilateral knee pain limiting patient. Patient has met 2/2 short term goals and 0/6 long term goals with ability to complete HEP and improvement in symptoms, and functional mobility. Remaining goals not met due to continued deficits in strength, ROM, activity tolerance, gait, balance, and functional mobility. Patient has made good progress toward remaining goals and is doing very well as we are now able to get into glute strengthening over last few session. Patient will continue to benefit from physical therapy in order to improve function and reduce impairment.     OBJECTIVE IMPAIRMENTS: Abnormal gait, decreased activity tolerance, decreased balance, decreased endurance, decreased mobility, difficulty walking, decreased ROM, decreased strength, increased muscle spasms, impaired flexibility, improper body mechanics, and pain  ACTIVITY LIMITATIONS: lifting, bending, standing, squatting, stairs, transfers, locomotion level, and caring for others  PARTICIPATION LIMITATIONS: meal prep, cleaning, laundry, shopping, community activity, occupation, and yard work  PERSONAL  FACTORS: Fitness and 3+ comorbidities: Hx R THA, R TKA, hx LBP, HTN are also affecting patient's functional outcome.   REHAB POTENTIAL: Good  CLINICAL DECISION MAKING: Evolving/moderate complexity  EVALUATION COMPLEXITY: Moderate   GOALS: Goals reviewed with patient? Yes  SHORT TERM GOALS: Target date: 08/04/2024    Patient will be independent with HEP in order to improve functional outcomes. Baseline: Goal status: Met 08/03/23  2.  Patient will report at least 25% improvement in symptoms/functional status for improved quality of life. Baseline: Goal status: Met 09/02/24   LONG TERM GOALS: Target date: 09/01/2024  Patient will report at least 75% improvement in symptoms for improved quality of life. Baseline:  Goal status: Progressing 09/02/24 (40%)  2.  Patient will improve LEFS  score by at least 15 points in order to indicate improved tolerance to activity. Baseline:  Goal status: Progressing 09/02/24; progressing 10/04/24  3.  Patient will be able to navigate stairs with reciprocal pattern without compensation in order to demonstrate improved LE strength. Baseline:  Goal status: Progressing 09/02/24  4. Patient will demonstrate grade of 5/5 MMT grade in all tested musculature as evidence of improved strength to assist with stair ambulation and gait. Baseline:  Goal status: Progressing 09/02/24; Partially met (LLE)/ progressing R 10/04/24  5.  Patient will be able to complete 5x STS in under 15 seconds in order to reduce the risk of falls. Baseline:  Goal status: Progressing 09/02/24  6.   SLS 30s without compensation for improved gait Baseline:  Goal status: New PLAN:  PT FREQUENCY: 1-2x/week  PT DURATION: 12 weeks  PLANNED INTERVENTIONS: [id number]- PT Re-evaluation, 97110-Therapeutic exercises, 97530- Therapeutic activity, [id number]- Neuromuscular re-education, 97535- Self Care, [id number]- Manual therapy, [id number]- Gait training, [phone]- Orthotic Fit/training, [phone]-  Canalith repositioning, [id number]- Aquatic Therapy, [phone]- Splinting, [phone]- Wound care (first 20 sq cm), 97598- Wound care (each additional 20 sq cm)Patient/Family education, Balance training, Stair training, Taping, Dry Needling, Joint mobilization, Joint manipulation, Spinal manipulation, Spinal mobilization, Scar mobilization, and DME instructions.  PLAN FOR NEXT SESSION: progress with hip gluteus medius tear repair   Prentice GORMAN Stains, PT, DPT 12/06/2024, 8:49 AM     "

## 2024-12-08 ENCOUNTER — Other Ambulatory Visit (HOSPITAL_BASED_OUTPATIENT_CLINIC_OR_DEPARTMENT_OTHER): Payer: Self-pay

## 2024-12-08 ENCOUNTER — Ambulatory Visit (HOSPITAL_BASED_OUTPATIENT_CLINIC_OR_DEPARTMENT_OTHER)

## 2024-12-08 ENCOUNTER — Ambulatory Visit (HOSPITAL_BASED_OUTPATIENT_CLINIC_OR_DEPARTMENT_OTHER): Admitting: Physical Therapy

## 2024-12-08 ENCOUNTER — Encounter (HOSPITAL_BASED_OUTPATIENT_CLINIC_OR_DEPARTMENT_OTHER): Payer: Self-pay | Admitting: Physical Therapy

## 2024-12-08 ENCOUNTER — Ambulatory Visit (HOSPITAL_BASED_OUTPATIENT_CLINIC_OR_DEPARTMENT_OTHER): Admitting: Orthopaedic Surgery

## 2024-12-08 DIAGNOSIS — M25512 Pain in left shoulder: Secondary | ICD-10-CM | POA: Diagnosis not present

## 2024-12-08 DIAGNOSIS — M25562 Pain in left knee: Secondary | ICD-10-CM | POA: Diagnosis not present

## 2024-12-08 DIAGNOSIS — M25551 Pain in right hip: Secondary | ICD-10-CM

## 2024-12-08 DIAGNOSIS — G8929 Other chronic pain: Secondary | ICD-10-CM | POA: Diagnosis not present

## 2024-12-08 DIAGNOSIS — R2689 Other abnormalities of gait and mobility: Secondary | ICD-10-CM

## 2024-12-08 DIAGNOSIS — M6281 Muscle weakness (generalized): Secondary | ICD-10-CM

## 2024-12-08 DIAGNOSIS — R29898 Other symptoms and signs involving the musculoskeletal system: Secondary | ICD-10-CM | POA: Diagnosis not present

## 2024-12-08 MED ORDER — OXYCODONE-ACETAMINOPHEN 10-325 MG PO TABS
1.0000 | ORAL_TABLET | ORAL | 0 refills | Status: AC | PRN
  Filled 2024-12-08: qty 10, 2d supply, fill #0

## 2024-12-08 NOTE — Therapy (Addendum)
 " OUTPATIENT PHYSICAL THERAPY TREATMENT    Patient Name: Reginald [name] MRN: [id number] DOB:09-12-1966, 59 y.o., male Today's Date: 12/08/2024    END OF SESSION:  PT End of Session - 12/08/24 0931     Visit Number 35    Number of Visits 56    Date for Recertification  01/20/25    Authorization Type [employer]    Progress Note Due on Visit 44    PT Start Time 0932    PT Stop Time 1012    PT Time Calculation (min) 40 min    Activity Tolerance Patient tolerated treatment well    Behavior During Therapy [facility] for tasks assessed/performed          Past Medical History:  Diagnosis Date   Arthritis    Gallstones    Hypertension    Past Surgical History:  Procedure Laterality Date   CHOLECYSTECTOMY  12/03/2011   Procedure: LAPAROSCOPIC CHOLECYSTECTOMY WITH INTRAOPERATIVE CHOLANGIOGRAM;  Surgeon: Camellia CHRISTELLA Blush, MD;  Location: [facility] OR;  Service: General;  Laterality: N/A;  laparoscopic cholecystectomy with intraoperative cholangiogram   CHONDROPLASTY Right 09/19/2015   Procedure: CHONDROPLASTY;  Surgeon: Norleen Gavel, MD;  Location: [facility];  Service: Orthopedics;  Laterality: Right;   ENDOVENOUS ABLATION SAPHENOUS VEIN W/ LASER Right 01/13/2019   endovenous laser ablation right greater saphenous vein and stab phlebectomy 10-20 incisions right leg by Lonni Blade MD    IR ABLATE LIVER CRYOABLATION  04/03/2020   IR RADIOLOGIST EVAL & MGMT  03/28/2020   KNEE ARTHROSCOPY WITH LATERAL MENISECTOMY Right 09/19/2015   Procedure: KNEE ARTHROSCOPY WITH PARTIAL LATERAL MENISECTOMY;  Surgeon: Norleen Gavel, MD;  Location: [facility];  Service: Orthopedics;  Laterality: Right;   KNEE ARTHROSCOPY WITH MEDIAL MENISECTOMY Right 09/19/2015   Procedure: KNEE ARTHROSCOPY WITH PARTIAL MEDIAL MENISECTOMY;  Surgeon: Norleen Gavel, MD;  Location: [facility];  Service: Orthopedics;  Laterality: Right;   OPEN SURGICAL REPAIR OF GLUTEAL TENDON Right  10/24/2024   Procedure: REPAIR, TENDON, GLUTEUS MEDIUS, ENDOSCOPIC;  Surgeon: Genelle Standing, MD;  Location: [facility];  Service: Orthopedics;  Laterality: Right;  RIGHT HIP ENDOSCOPIC GLUTEUS MEDIUS REPAIR   ROTATOR CUFF REPAIR Left    with revision   spider bite     black widow or brown recluse   TOE FUSION Left    TOTAL HIP ARTHROPLASTY Right 06/15/2023   Procedure: TOTAL HIP ARTHROPLASTY ANTERIOR APPROACH;  Surgeon: Gavel Norleen, MD;  Location: [facility];  Service: Orthopedics;  Laterality: Right;   TOTAL KNEE ARTHROPLASTY Right 03/31/2018   Procedure: RIGHT TOTAL KNEE ARTHROPLASTY;  Surgeon: Gavel Norleen, MD;  Location: [facility];  Service: Orthopedics;  Laterality: Right;   TOTAL KNEE REVISION Right 11/04/2019   Procedure: RIGHT TOTAL KNEE REVISION;  Surgeon: Gavel Norleen, MD;  Location: [facility];  Service: Orthopedics;  Laterality: Right;   TRIGGER FINGER RELEASE Right    Patient Active Problem List   Diagnosis Date Noted   Tear of right gluteus medius tendon 10/24/2024   Primary osteoarthritis of right hip 06/14/2023   Meralgia paraesthetica, right 06/10/2023   Spondylosis without myelopathy or radiculopathy, lumbar region 06/10/2023   Painful total knee replacement, right 11/04/2019   Arthrofibrosis of knee joint, right 11/04/2019   S/P revision of total knee, right 11/04/2019   Primary osteoarthritis of right knee 03/31/2018   Hypertension 12/02/2011   Obesity (BMI 30-39.9) 12/02/2011    PCP: [facility]  REFERRING PROVIDER: Gavel Norleen, MD/  glute repair Genelle Standing, MD  REFERRING DIAG: S/P right hip trochanteric bursectomy and IT band release 10/28/24 - S76.011A (ICD-10-CM) - Tear of right gluteus medius tendon, initial encounter  PROCEDURE: 1. Right hip gluteus medius tear repair 2. Right hip trochanteric bursectomy DOS 10/24/24  THERAPY DIAG:  Other symptoms and signs involving the musculoskeletal system  Pain in right hip  Other  abnormalities of gait and mobility  Muscle weakness (generalized)  Rationale for Evaluation and Treatment: Rehabilitation  ONSET DATE: 3 weeks ago; Glute repair 10/24/24  SUBJECTIVE:   SUBJECTIVE STATEMENT:  Patient states hip is feeling stiff, maybe the weather.   PERTINENT HISTORY: Hx R THA, R TKA, hx LBP, HTN,   PAIN:  Are you having pain? Yes: NPRS scale: 5/10 Pain location: R hip  Pain description: aching, sharp L knee; everywhere else is more an ache, R hip feels like it has a pressure on it  Aggravating factors: not sure  Relieving factors: nothing/time and rest   PRECAUTIONS: None  WEIGHT BEARING RESTRICTIONS: No  FALLS:  Has patient fallen in last 6 months? No  PLOF: Independent  PATIENT GOALS:  a little bit of relief  OBJECTIVE: (objective measures from initial evaluation unless otherwise dated) OBSERVATION:  10/28/24: sutures intact, no s/s of infection   MRI 8/25 right hip  IMPRESSION: 1. Right total hip arthroplasty with susceptibility artifact partially obscuring the adjacent soft tissue and osseous structures. No periarticular fluid collection or osteolysis. 2. Large amount of fluid in the right greater trochanteric bursa consistent with bursitis. 3. Mild osteoarthritis of the left hip.  PATIENT SURVEYS:  LEFS  Extreme difficulty/unable (0), Quite a bit of difficulty (1), Moderate difficulty (2), Little difficulty (3), No difficulty (4) Survey date:  07/07/24 09/02/24 10/04/24 10/28/24 12/06/24  Any of your usual work, housework or school activities 1 1  2 2   2. Usual hobbies, recreational or sporting activities 1 2  1 2   3. Getting into/out of the bath 1 1  1 1   4. Walking between rooms 2 2  2 2   5. Putting on socks/shoes 1 1  1 1   6. Squatting  2 1  1 1   7. Lifting an object, like a bag of groceries from the floor 2 2  1 2   8. Performing light activities around your home 2 2  1 2   9. Performing heavy activities around your home 0 0  0 1  10.  Getting into/out of a car 2 1  1 2   11. Walking 2 blocks 0 1  1 2   12. Walking 1 mile 0 0  0 0  13. Going up/down 10 stairs (1 flight) 1 1  1 1   14. Standing for 1 hour 1 1  1 1   15.  sitting for 1 hour 2 2  2 2   16. Running on even ground 0 0  0 0  17. Running on uneven ground 0 0  0 0  18. Making sharp turns while running fast 0 0  0 0  19. Hopping  0 0  0 0  20. Rolling over in bed 1 1  1 3   Score total:  19/80 19/80 23/80  17/80 25/80     COGNITION: Overall cognitive status: Within functional limits for tasks assessed     SENSATION: WFL  POSTURE: No Significant postural limitations  PALPATION: TTP R greater Troch   LOWER EXTREMITY ROM: 10/28/24: WFL for tasks assessed  Active ROM Right eval Left eval Right  10/28/24 Left 10/28/24  Hip flexion   90   Hip extension      Hip abduction      Hip adduction      Hip internal rotation      Hip external rotation      Knee flexion      Knee extension      Ankle dorsiflexion      Ankle plantarflexion      Ankle inversion      Ankle eversion       (Blank rows = not tested) *= pain/symptoms  LOWER EXTREMITY MMT: 10/28/24: not fully assessed due to post op status  MMT Right eval Left eval Right 09/02/24 Left 09/02/24 Right  10/04/24 Right 10/28/24 Left 10/28/24  Hip flexion 4+ 5 4+ 5 4+ -5- 4+ 4+  Hip extension 4 4+ 4+ 5 4+ -5-    Hip abduction (sidelying) 4-  4+ 5 4+    Hip adduction         Hip internal rotation         Hip external rotation         Knee flexion 5 5 5 5  5 5   Knee extension 5 5 5 5  5 5   Ankle dorsiflexion         Ankle plantarflexion         Ankle inversion         Ankle eversion          (Blank rows = not tested) *= pain/symptoms  FUNCTIONAL TESTS:  5 times sit to stand: 26.97 seconds  Stairs: 7 inch, step too pattern, uses LLE 5xSTS 09/02/24: 17.38 without use of Ues  10/28/24: not fully assessed due to post op status  GAIT: Distance walked: 100 feet  Assistive device utilized:  None Level of assistance: Complete Independence Comments: antalgic on RLE with truncal lean to R  GAIT: 10/28/24 Distance walked: 100 feet  Assistive device utilized: None Level of assistance: Complete Independence Comments: antalgic on RLE with truncal lean to R  TODAY'S TREATMENT:     12/08/24 Bike 5 minutes  Manual: STM TFL, adductors, glutes, and hip flexor Hip rotation on stool 3 x 10 Step up 6 inch 3 x 10 Lateral step up 6 inch 3 x 10 Lateral step down 4 inch 2 x 10 Dead lift 10# 2 x 10  12-08-2024 Bike 5 minutes  Manual: STM TFL, adductors, glutes, and hip flexor Standing hip abduction 3 x 10 Standing hip extension 3 x 10 Step up 4 inch 2 x 10 Lateral step up 4 inch 2 x 10 Lateral stepping 8 x 12 feet Hip rotation on stool 3 x 10   11/29/24 Bike 5 minutes  Manual: STM TFL, adductors, glutes, and hip flexor Supine clam GTB 3 x 10 Prone hip extension 3# 3 x 10 Standing hip abduction 2 x 10 Standing hip extension 2 x 10 Mini squat 2 x 10 Leg press 130# 1 x 10 (RPE 3/10), 150# 2 x 10 (RPE 5-6/10) SLS with intermittent UE support 3 x 20-30 second holds Chair squat 2 x 10   11/24/24 Bike 5 minutes  Manual: STM TFL, adductors, glutes, and hip flexor; lateral distraction with belt Supine hip abduction isometrics with belt 2 x 10 with 5-10 second holds Prone hip extension 2 x 10 Prone hip extension with knee bent 2 x 10 STS from elevated table 2 x 10 SLS with UE support 3 x 20-30 second holds Standing hip  adductor stretch with OH reach 5 x 10 second holds  11/22/24 Bike 5 minutes  Manual: STM TFL, adductors, and hip flexor; lateral distraction with belt Supine hip adductor stretch 5 x 10 second holds Supine hip abduction isometrics with belt 2 x 10 with 5-10 second holds Supine gentle hip flexor isometric 10 x 5-10 second holds Bridge 10# 3 x 10 Prone hip extension 2 x 10   11/08/24 STM TFL and hip flexor Education on gait mechanics Bike 5 minutes  Hooklying  glute set 5 x 5 second holds, with small lift 2 x 10 Prone glute sets 2 x 10 with 5-10 second holds Quadruped rocking for hip flexion 2 x 10 Standing weight shifts with glute set 2 x 10 Standing glute set with LLE on step and overhead lift 5# 2 x 10   11/04/24 Nustep level 5, 5 minutes for dynamic warm up Hip adduction isometric 10 x 5-10 second holds TA activation with small bent knee fall out 2 x 10 SAQ 2# 2 x 10 with 5 second holds Prone glute sets 2 x 10 with 5-10 second holds Prone hamstring curl 5# 2 x 10 LAQ 5# 2 x 10     PATIENT EDUCATION:  Education details: exercise progression/ modification, glute med repair protocol, reassessment findings, POC, use of SPC Person educated: Patient Education method: Explanation, Demonstration,  Education comprehension: verbalized understanding, returned demonstration, verbal cues required, and tactile cues required  HOME EXERCISE PROGRAM: POST OP GLUTE Access Code: [email or web address] URL: [email or web address] Date: 10/28/2024 Prepared by: Prentice Ariell Gunnels  Exercises - Hooklying Gluteal Sets  - 3 x daily - 7 x weekly - 10 reps - 5-10 second hold - Supine Hip Adduction Isometric with Ball  - 1 x daily - 7 x weekly - 10 reps - 5-10 second hold - Prone Knee Flexion (Mirrored)  - 3 x daily - 7 x weekly - 3 sets - 10 reps - Abdominal Bracing  - 3 x daily - 7 x weekly - 10 reps - 5-10 second hold - Seated Long Arc Quad  - 3 x daily - 7 x weekly - 2 sets - 10 reps - 5-10 second hold   Access Code: [email or web address] URL: [email or web address] Date: 07/07/2024 Prepared by: Prentice Rorey Bisson  Exercises - Supine Bridge  - 1 x daily - 7 x weekly - 3 sets - 10 reps - Clamshell (Mirrored)  - 1 x daily - 7 x weekly - 3 sets - 10 reps  Access Code: [email or web address] URL: [email or web address] Date: 09/27/2024 Prepared by: Frankie Ziemba  Exercises - Stool Scoots  - 1 x daily - 1-3 x weekly - Noodle press  - 1 x daily -  1-3 x weekly - 1-3 sets - 10 reps - Noodle Stomp  - 1 x daily - 1-3 x weekly - 1-2 sets - 10 reps - Standing Balance on Noodle at [employer]  - 1 x daily - 1-3 x weekly - 1-3 reps - 20 hold - Squat on Noodle  - 1 x daily - 1-3 x weekly - 1-3 sets - 10 reps - Plank on Long Hand Float with Leg Lift  - 1 x daily - 1-3 x weekly - 1-2 sets - 10 reps - Flutter Kicking/Windshield Wipers  - 1 x daily - 1-3 x weekly - 3 sets - 10 reps - Runner's Step Up/Down  - 1 x daily - 1-3 x weekly - 1-3 sets - 10 reps - arm swing resisted with hand bells  -  1 x daily - 1-3 x weekly - 3 sets - 5-10 reps ASSESSMENT:  CLINICAL IMPRESSION: Began session on bike for dynamic warm up. Manual to mildly hyperactive and tender hip flexor/adductors. Continued to progress into further glute and functional strengthening. Patient will continue to benefit from physical therapy in order to improve function and reduce impairment.   OBJECTIVE IMPAIRMENTS: Abnormal gait, decreased activity tolerance, decreased balance, decreased endurance, decreased mobility, difficulty walking, decreased ROM, decreased strength, increased muscle spasms, impaired flexibility, improper body mechanics, and pain  ACTIVITY LIMITATIONS: lifting, bending, standing, squatting, stairs, transfers, locomotion level, and caring for others  PARTICIPATION LIMITATIONS: meal prep, cleaning, laundry, shopping, community activity, occupation, and yard work  PERSONAL FACTORS: Fitness and 3+ comorbidities: Hx R THA, R TKA, hx LBP, HTN are also affecting patient's functional outcome.   REHAB POTENTIAL: Good  CLINICAL DECISION MAKING: Evolving/moderate complexity  EVALUATION COMPLEXITY: Moderate   GOALS: Goals reviewed with patient? Yes  SHORT TERM GOALS: Target date: 08/04/2024    Patient will be independent with HEP in order to improve functional outcomes. Baseline: Goal status: Met 08/03/23  2.  Patient will report at least 25% improvement in  symptoms/functional status for improved quality of life. Baseline: Goal status: Met 09/02/24   LONG TERM GOALS: Target date: 09/01/2024  Patient will report at least 75% improvement in symptoms for improved quality of life. Baseline:  Goal status: Progressing 09/02/24 (40%) 12/06/24 50%  2.  Patient will improve LEFS  score by at least 15 points in order to indicate improved tolerance to activity. Baseline:  Goal status: Progressing 09/02/24; progressing 10/04/24  3.  Patient will be able to navigate stairs with reciprocal pattern without compensation in order to demonstrate improved LE strength. Baseline:  Goal status: Progressing 09/02/24  4. Patient will demonstrate grade of 5/5 MMT grade in all tested musculature as evidence of improved strength to assist with stair ambulation and gait. Baseline:  Goal status: Progressing 09/02/24; Partially met (LLE)/ progressing R 10/04/24  5.  Patient will be able to complete 5x STS in under 15 seconds in order to reduce the risk of falls. Baseline:  Goal status: Progressing 09/02/24  6.   SLS 30s without compensation for improved gait Baseline:  Goal status: New PLAN:  PT FREQUENCY: 1-2x/week  PT DURATION: 12 weeks  PLANNED INTERVENTIONS: 97164- PT Re-evaluation, 97110-Therapeutic exercises, 97530- Therapeutic activity, [id number]- Neuromuscular re-education, 97535- Self Care, [id number]- Manual therapy, [id number]- Gait training, [phone]- Orthotic Fit/training, [phone]- Canalith repositioning, [id number]- Aquatic Therapy, [phone]- Splinting, [id number]- Wound care (first 20 sq cm), 97598- Wound care (each additional 20 sq cm)Patient/Family education, Balance training, Stair training, Taping, Dry Needling, Joint mobilization, Joint manipulation, Spinal manipulation, Spinal mobilization, Scar mobilization, and DME instructions.  PLAN FOR NEXT SESSION: progress with hip gluteus medius tear repair   Prentice GORMAN Stains, PT, DPT 12/08/2024, 10:13 AM     "

## 2024-12-08 NOTE — Progress Notes (Signed)
 "                                Post Operative Evaluation    Procedure/Date of Surgery: Right hip endoscopic gluteus medius repair 12/8  Interval History:     Presents 6 weeks status post above procedure and overall he is doing extremely well.  Today he is hoping to discuss more about his left shoulder and left knee.  He does have a known history of left knee osteoarthritis.  This is tricompartmental.  He has had multiple injections as well as hyaluronic acid injections.  He has had physical therapy in the past without significant relief.  He has status post right knee arthroplasty done with [facility] which he states is still having some limited motion and pain.  With regard to his left shoulder he does have a history now of 2 rotator cuff repairs.  His initial 1 did fail and subsequent was revised.  Since that time has been having a very difficult time with overhead lifting despite physical therapy which was supervised   PMH/PSH/Family History/Social History/Meds/Allergies:    Past Medical History:  Diagnosis Date   Arthritis    Gallstones    Hypertension    Past Surgical History:  Procedure Laterality Date   CHOLECYSTECTOMY  12/03/2011   Procedure: LAPAROSCOPIC CHOLECYSTECTOMY WITH INTRAOPERATIVE CHOLANGIOGRAM;  Surgeon: Camellia CHRISTELLA Blush, MD;  Location: [facility];  Service: General;  Laterality: N/A;  laparoscopic cholecystectomy with intraoperative cholangiogram   CHONDROPLASTY Right 09/19/2015   Procedure: CHONDROPLASTY;  Surgeon: Norleen Gavel, MD;  Location: [facility];  Service: Orthopedics;  Laterality: Right;   ENDOVENOUS ABLATION SAPHENOUS VEIN W/ LASER Right 01/13/2019   endovenous laser ablation right greater saphenous vein and stab phlebectomy 10-20 incisions right leg by Lonni Blade MD    IR ABLATE LIVER CRYOABLATION  04/03/2020   IR RADIOLOGIST EVAL & MGMT  03/28/2020   KNEE ARTHROSCOPY WITH LATERAL MENISECTOMY Right 09/19/2015   Procedure: KNEE  ARTHROSCOPY WITH PARTIAL LATERAL MENISECTOMY;  Surgeon: Norleen Gavel, MD;  Location: [facility];  Service: Orthopedics;  Laterality: Right;   KNEE ARTHROSCOPY WITH MEDIAL MENISECTOMY Right 09/19/2015   Procedure: KNEE ARTHROSCOPY WITH PARTIAL MEDIAL MENISECTOMY;  Surgeon: Norleen Gavel, MD;  Location: [facility];  Service: Orthopedics;  Laterality: Right;   OPEN SURGICAL REPAIR OF GLUTEAL TENDON Right 10/24/2024   Procedure: REPAIR, TENDON, GLUTEUS MEDIUS, ENDOSCOPIC;  Surgeon: Genelle Standing, MD;  Location: [facility];  Service: Orthopedics;  Laterality: Right;  RIGHT HIP ENDOSCOPIC GLUTEUS MEDIUS REPAIR   ROTATOR CUFF REPAIR Left    with revision   spider bite     black widow or brown recluse   TOE FUSION Left    TOTAL HIP ARTHROPLASTY Right 06/15/2023   Procedure: TOTAL HIP ARTHROPLASTY ANTERIOR APPROACH;  Surgeon: Gavel Norleen, MD;  Location: [facility];  Service: Orthopedics;  Laterality: Right;   TOTAL KNEE ARTHROPLASTY Right 03/31/2018   Procedure: RIGHT TOTAL KNEE ARTHROPLASTY;  Surgeon: Gavel Norleen, MD;  Location: [facility];  Service: Orthopedics;  Laterality: Right;   TOTAL KNEE REVISION Right 11/04/2019   Procedure: RIGHT TOTAL KNEE REVISION;  Surgeon: Gavel Norleen, MD;  Location: [facility];  Service: Orthopedics;  Laterality: Right;   TRIGGER FINGER RELEASE Right    Social History   Socioeconomic History   Marital status: Married    Spouse name: Not on file   Number  of children: Not on file   Years of education: Not on file   Highest education level: Not on file  Occupational History   Not on file  Tobacco Use   Smoking status: Never   Smokeless tobacco: Never  Vaping Use   Vaping status: Never Used  Substance and Sexual Activity   Alcohol use: Yes    Alcohol/week: 1.0 standard drink of alcohol    Types: 1 Cans of beer per week    Comment: occa.   Drug use: No   Sexual activity: Not Currently  Other Topics Concern   Not on file   Social History Narrative   Not on file   Social Drivers of Health   Tobacco Use: Low Risk (12/08/2024)   Patient History    Smoking Tobacco Use: Never    Smokeless Tobacco Use: Never    Passive Exposure: Not on file  Financial Resource Strain: Not on file  Food Insecurity: Not on file  Transportation Needs: Not on file  Physical Activity: Not on file  Stress: Not on file  Social Connections: Not on file  Depression (EYV7-0): Not on file  Alcohol Screen: Not on file  Housing: Not on file  Utilities: Not on file  Health Literacy: Not on file   Family History  Problem Relation Age of Onset   Cancer Mother        breast   Heart disease Mother    Allergies[1] Current Outpatient Medications  Medication Sig Dispense Refill   oxyCODONE -acetaminophen  (PERCOCET) 10-325 MG tablet Take 1 tablet by mouth every 4 (four) hours as needed for pain. 10 tablet 0   aspirin  EC 325 MG tablet Take 1 tablet (325 mg total) by mouth daily. 14 tablet 0   celecoxib  (CELEBREX ) 200 MG capsule Take 1 capsule (200 mg total) by mouth 2 (two) times daily. 60 capsule 2   docusate sodium  (COLACE) 100 MG capsule Take 1 capsule (100 mg total) by mouth 2 (two) times daily. 30 capsule 0   folic acid (FOLVITE) 1 MG tablet Take 1 mg by mouth daily.     HYDROcodone -acetaminophen  (NORCO) 10-325 MG tablet Take 1 tablet by mouth 3 (three) times daily.     LORazepam  (ATIVAN ) 1 MG tablet Take 1 tablet (1 mg total) by mouth every 8 (eight) hours. 2 tablet 0   losartan  (COZAAR ) 100 MG tablet Take 100 mg by mouth daily.     Menthol, Topical Analgesic, (BIOFREEZE EX) Apply 1 Application topically daily as needed (pain).     oxyCODONE  (ROXICODONE ) 5 MG immediate release tablet Take 1 tablet (5 mg total) by mouth every 4 (four) hours as needed for severe pain (pain score 7-10) or breakthrough pain. 20 tablet 0   oxyCODONE -acetaminophen  (PERCOCET/ROXICET) 5-325 MG tablet Take 1-2 tablets by mouth every 6 (six) hours as needed  for severe pain. 30 tablet 0   tiZANidine  (ZANAFLEX ) 2 MG tablet Take 1 tablet (2 mg total) by mouth every 8 (eight) hours as needed for muscle spasms. 40 tablet 0   VITAMIN D PO Take 1 capsule by mouth daily.     No current facility-administered medications for this visit.   No results found.  Review of Systems:   A ROS was performed including pertinent positives and negatives as documented in the HPI.   Musculoskeletal Exam:    There were no vitals taken for this visit.  Right hip incisions are well-appearing without erythema or drainage.  30 degrees internal/external rotation of the hip are  without pain walks with a normal gait   Left shoulder active forward elevation is 100 degrees with: Positive Neer impingement and empty bucket test 4 out of 5 strength in forward elevation external rotation at the side is 20 degrees internal rotation is to L3  Left knee with tricompartmental pain but worse about the medial joint line.  Mild crepitus otherwise range of motion is from 0 to 130 degrees with intact strength and sensation  Imaging:    X-ray 3 views left shoulder, 4 views left knee: Left humerus high riding consistent with rotator cuff arthropathy left knee with evidence of moderate tricompartmental osteoarthritis  I personally reviewed and interpreted the radiographs.   Assessment:   6 weeks status post right hip endoscopic gluteus medius repair doing extremely well.  I did discuss that his left shoulder does show evidence of recurrent rotator cuff tear with elevated humeral head on x-ray.  Given this I would like to obtain an MRI to confirm that there has been in fact a retear of his rotator cuff.  With regard to the left knee he does have evidence of tricompartmental osteoarthritis which is now failed hyaluronic acid injections as well as multiple steroid injections.  Given this he would like to discuss the possibility of arthroplasty.  For this we will plan to refer him to Dr.  Vernetta  Plan :    - Return to clinic following MRI left shoulder      I personally saw and evaluated the patient, and participated in the management and treatment plan.  Elspeth Parker, MD Attending Physician, Orthopedic Surgery  This document was dictated using Dragon voice recognition software. A reasonable attempt at proof reading has been made to minimize errors.     [1]  Allergies Allergen Reactions   Gabapentin      Head cloudy    Ibuprofen  Nausea And Vomiting    High doses make him vomit.   "

## 2024-12-13 ENCOUNTER — Encounter (HOSPITAL_BASED_OUTPATIENT_CLINIC_OR_DEPARTMENT_OTHER): Payer: Self-pay | Admitting: Physical Therapy

## 2024-12-13 ENCOUNTER — Ambulatory Visit (HOSPITAL_BASED_OUTPATIENT_CLINIC_OR_DEPARTMENT_OTHER): Admitting: Physical Therapy

## 2024-12-13 DIAGNOSIS — M6281 Muscle weakness (generalized): Secondary | ICD-10-CM

## 2024-12-13 DIAGNOSIS — R2689 Other abnormalities of gait and mobility: Secondary | ICD-10-CM

## 2024-12-13 DIAGNOSIS — R29898 Other symptoms and signs involving the musculoskeletal system: Secondary | ICD-10-CM

## 2024-12-13 DIAGNOSIS — M25551 Pain in right hip: Secondary | ICD-10-CM

## 2024-12-13 NOTE — Therapy (Signed)
 " OUTPATIENT PHYSICAL THERAPY TREATMENT    Patient Name: Reginald [name] MRN: [id number] DOB:[birth year], 59 y.o., male Today's Date: 12/13/2024    END OF SESSION:  PT End of Session - 12/13/24 0928     Visit Number 36    Number of Visits 56    Date for Recertification  01/20/25    Authorization Type [employer]    Progress Note Due on Visit 44    PT Start Time 0929    PT Stop Time 1009    PT Time Calculation (min) 40 min    Activity Tolerance Patient tolerated treatment well    Behavior During Therapy [facility] [employer] for tasks assessed/performed          Past Medical History:  Diagnosis Date   Arthritis    Gallstones    Hypertension    Past Surgical History:  Procedure Laterality Date   CHOLECYSTECTOMY  12/03/2011   Procedure: LAPAROSCOPIC CHOLECYSTECTOMY WITH INTRAOPERATIVE CHOLANGIOGRAM;  Surgeon: Camellia CHRISTELLA Blush, MD;  Location: [facility] OR;  Service: General;  Laterality: N/A;  laparoscopic cholecystectomy with intraoperative cholangiogram   CHONDROPLASTY Right 09/19/2015   Procedure: CHONDROPLASTY;  Surgeon: Norleen Gavel, MD;  Location: [facility];  Service: Orthopedics;  Laterality: Right;   ENDOVENOUS ABLATION SAPHENOUS VEIN W/ LASER Right 01/13/2019   endovenous laser ablation right greater saphenous vein and stab phlebectomy 10-20 incisions right leg by Lonni Blade MD    IR ABLATE LIVER CRYOABLATION  04/03/2020   IR RADIOLOGIST EVAL & MGMT  03/28/2020   KNEE ARTHROSCOPY WITH LATERAL MENISECTOMY Right 09/19/2015   Procedure: KNEE ARTHROSCOPY WITH PARTIAL LATERAL MENISECTOMY;  Surgeon: Norleen Gavel, MD;  Location: [facility];  Service: Orthopedics;  Laterality: Right;   KNEE ARTHROSCOPY WITH MEDIAL MENISECTOMY Right 09/19/2015   Procedure: KNEE ARTHROSCOPY WITH PARTIAL MEDIAL MENISECTOMY;  Surgeon: Norleen Gavel, MD;  Location: [facility];  Service: Orthopedics;  Laterality: Right;   OPEN SURGICAL REPAIR OF GLUTEAL TENDON Right  10/24/2024   Procedure: REPAIR, TENDON, GLUTEUS MEDIUS, ENDOSCOPIC;  Surgeon: Genelle Standing, MD;  Location: [facility];  Service: Orthopedics;  Laterality: Right;  RIGHT HIP ENDOSCOPIC GLUTEUS MEDIUS REPAIR   ROTATOR CUFF REPAIR Left    with revision   spider bite     black widow or brown recluse   TOE FUSION Left    TOTAL HIP ARTHROPLASTY Right 06/15/2023   Procedure: TOTAL HIP ARTHROPLASTY ANTERIOR APPROACH;  Surgeon: Gavel Norleen, MD;  Location: [facility];  Service: Orthopedics;  Laterality: Right;   TOTAL KNEE ARTHROPLASTY Right 03/31/2018   Procedure: RIGHT TOTAL KNEE ARTHROPLASTY;  Surgeon: Gavel Norleen, MD;  Location: [facility];  Service: Orthopedics;  Laterality: Right;   TOTAL KNEE REVISION Right 11/04/2019   Procedure: RIGHT TOTAL KNEE REVISION;  Surgeon: Gavel Norleen, MD;  Location: [facility];  Service: Orthopedics;  Laterality: Right;   TRIGGER FINGER RELEASE Right    Patient Active Problem List   Diagnosis Date Noted   Tear of right gluteus medius tendon 10/24/2024   Primary osteoarthritis of right hip 06/14/2023   Meralgia paraesthetica, right 06/10/2023   Spondylosis without myelopathy or radiculopathy, lumbar region 06/10/2023   Painful total knee replacement, right 11/04/2019   Arthrofibrosis of knee joint, right 11/04/2019   S/P revision of total knee, right 11/04/2019   Primary osteoarthritis of right knee 03/31/2018   Hypertension 12/02/2011   Obesity (BMI 30-39.9) 12/02/2011    PCP: [facility]  REFERRING PROVIDER: Gavel Norleen, MD/  glute repair Genelle Standing, MD  REFERRING DIAG: S/P right hip trochanteric bursectomy and IT band release 10/28/24 - S76.011A (ICD-10-CM) - Tear of right gluteus medius tendon, initial encounter  PROCEDURE: 1. Right hip gluteus medius tear repair 2. Right hip trochanteric bursectomy DOS 10/24/24  THERAPY DIAG:  Pain in right hip  Other symptoms and signs involving the musculoskeletal system  Other  abnormalities of gait and mobility  Muscle weakness (generalized)  Rationale for Evaluation and Treatment: Rehabilitation  ONSET DATE: 3 weeks ago; Glute repair 10/24/24  SUBJECTIVE:   SUBJECTIVE STATEMENT:  Patient states doing well. Hip is feeling alright.   PERTINENT HISTORY: Hx R THA, R TKA, hx LBP, HTN,   PAIN:  Are you having pain? Yes: NPRS scale: 5/10 Pain location: R hip  Pain description: aching, sharp L knee; everywhere else is more an ache, R hip feels like it has a pressure on it  Aggravating factors: not sure  Relieving factors: nothing/time and rest   PRECAUTIONS: None  WEIGHT BEARING RESTRICTIONS: No  FALLS:  Has patient fallen in last 6 months? No  PLOF: Independent  PATIENT GOALS:  a little bit of relief  OBJECTIVE: (objective measures from initial evaluation unless otherwise dated) OBSERVATION:  10/28/24: sutures intact, no s/s of infection   MRI 8/25 right hip  IMPRESSION: 1. Right total hip arthroplasty with susceptibility artifact partially obscuring the adjacent soft tissue and osseous structures. No periarticular fluid collection or osteolysis. 2. Large amount of fluid in the right greater trochanteric bursa consistent with bursitis. 3. Mild osteoarthritis of the left hip.  PATIENT SURVEYS:  LEFS  Extreme difficulty/unable (0), Quite a bit of difficulty (1), Moderate difficulty (2), Little difficulty (3), No difficulty (4) Survey date:  07/07/24 09/02/24 10/04/24 10/28/24 12/06/24  Any of your usual work, housework or school activities 1 1  2 2   2. Usual hobbies, recreational or sporting activities 1 2  1 2   3. Getting into/out of the bath 1 1  1 1   4. Walking between rooms 2 2  2 2   5. Putting on socks/shoes 1 1  1 1   6. Squatting  2 1  1 1   7. Lifting an object, like a bag of groceries from the floor 2 2  1 2   8. Performing light activities around your home 2 2  1 2   9. Performing heavy activities around your home 0 0  0 1  10.  Getting into/out of a car 2 1  1 2   11. Walking 2 blocks 0 1  1 2   12. Walking 1 mile 0 0  0 0  13. Going up/down 10 stairs (1 flight) 1 1  1 1   14. Standing for 1 hour 1 1  1 1   15.  sitting for 1 hour 2 2  2 2   16. Running on even ground 0 0  0 0  17. Running on uneven ground 0 0  0 0  18. Making sharp turns while running fast 0 0  0 0  19. Hopping  0 0  0 0  20. Rolling over in bed 1 1  1 3   Score total:  19/80 19/80 23/80  17/80 25/80     COGNITION: Overall cognitive status: Within functional limits for tasks assessed     SENSATION: WFL  POSTURE: No Significant postural limitations  PALPATION: TTP R greater Troch   LOWER EXTREMITY ROM: 10/28/24: WFL for tasks assessed  Active ROM Right eval Left eval Right 10/28/24  Left 10/28/24  Hip flexion   90   Hip extension      Hip abduction      Hip adduction      Hip internal rotation      Hip external rotation      Knee flexion      Knee extension      Ankle dorsiflexion      Ankle plantarflexion      Ankle inversion      Ankle eversion       (Blank rows = not tested) *= pain/symptoms  LOWER EXTREMITY MMT: 10/28/24: not fully assessed due to post op status  MMT Right eval Left eval Right 09/02/24 Left 09/02/24 Right  10/04/24 Right 10/28/24 Left 10/28/24  Hip flexion 4+ 5 4+ 5 4+ -5- 4+ 4+  Hip extension 4 4+ 4+ 5 4+ -5-    Hip abduction (sidelying) 4-  4+ 5 4+    Hip adduction         Hip internal rotation         Hip external rotation         Knee flexion 5 5 5 5  5 5   Knee extension 5 5 5 5  5 5   Ankle dorsiflexion         Ankle plantarflexion         Ankle inversion         Ankle eversion          (Blank rows = not tested) *= pain/symptoms  FUNCTIONAL TESTS:  5 times sit to stand: 26.97 seconds  Stairs: 7 inch, step too pattern, uses LLE 5xSTS 09/02/24: 17.38 without use of Ues  10/28/24: not fully assessed due to post op status  GAIT: Distance walked: 100 feet  Assistive device utilized:  None Level of assistance: Complete Independence Comments: antalgic on RLE with truncal lean to R  GAIT: 10/28/24 Distance walked: 100 feet  Assistive device utilized: None Level of assistance: Complete Independence Comments: antalgic on RLE with truncal lean to R  TODAY'S TREATMENT:     12/13/24 Bike 5 minutes  Manual: STM TFL, adductors, glutes, and hip flexor Step up 8 inch 3 x 10 Lateral step up 8 inch 3 x 10 Lateral step down 6 inch 3 x 10 Dead lift 20# 3 x 10 SL mini squat slide out 3 way 2 x 8  12/08/24 Bike 5 minutes  Manual: STM TFL, adductors, glutes, and hip flexor Hip rotation on stool 3 x 10 Step up 6 inch 3 x 10 Lateral step up 6 inch 3 x 10 Lateral step down 4 inch 2 x 10 Dead lift 10# 2 x 10  11-Dec-2024 Bike 5 minutes  Manual: STM TFL, adductors, glutes, and hip flexor Standing hip abduction 3 x 10 Standing hip extension 3 x 10 Step up 4 inch 2 x 10 Lateral step up 4 inch 2 x 10 Lateral stepping 8 x 12 feet Hip rotation on stool 3 x 10   11/29/24 Bike 5 minutes  Manual: STM TFL, adductors, glutes, and hip flexor Supine clam GTB 3 x 10 Prone hip extension 3# 3 x 10 Standing hip abduction 2 x 10 Standing hip extension 2 x 10 Mini squat 2 x 10 Leg press 130# 1 x 10 (RPE 3/10), 150# 2 x 10 (RPE 5-6/10) SLS with intermittent UE support 3 x 20-30 second holds Chair squat 2 x 10   11/24/24 Bike 5 minutes  Manual: STM TFL, adductors, glutes, and  hip flexor; lateral distraction with belt Supine hip abduction isometrics with belt 2 x 10 with 5-10 second holds Prone hip extension 2 x 10 Prone hip extension with knee bent 2 x 10 STS from elevated table 2 x 10 SLS with UE support 3 x 20-30 second holds Standing hip adductor stretch with OH reach 5 x 10 second holds  11/22/24 Bike 5 minutes  Manual: STM TFL, adductors, and hip flexor; lateral distraction with belt Supine hip adductor stretch 5 x 10 second holds Supine hip abduction isometrics with belt 2 x 10  with 5-10 second holds Supine gentle hip flexor isometric 10 x 5-10 second holds Bridge 10# 3 x 10 Prone hip extension 2 x 10   11/08/24 STM TFL and hip flexor Education on gait mechanics Bike 5 minutes  Hooklying glute set 5 x 5 second holds, with small lift 2 x 10 Prone glute sets 2 x 10 with 5-10 second holds Quadruped rocking for hip flexion 2 x 10 Standing weight shifts with glute set 2 x 10 Standing glute set with LLE on step and overhead lift 5# 2 x 10   11/04/24 Nustep level 5, 5 minutes for dynamic warm up Hip adduction isometric 10 x 5-10 second holds TA activation with small bent knee fall out 2 x 10 SAQ 2# 2 x 10 with 5 second holds Prone glute sets 2 x 10 with 5-10 second holds Prone hamstring curl 5# 2 x 10 LAQ 5# 2 x 10     PATIENT EDUCATION:  Education details: exercise progression/ modification, glute med repair protocol, reassessment findings, POC, use of SPC Person educated: Patient Education method: Explanation, Demonstration,  Education comprehension: verbalized understanding, returned demonstration, verbal cues required, and tactile cues required  HOME EXERCISE PROGRAM: POST OP GLUTE Access Code: [email or web address] URL: [email or web address] Date: 10/28/2024 Prepared by: Prentice Arnie Clingenpeel  Exercises - Hooklying Gluteal Sets  - 3 x daily - 7 x weekly - 10 reps - 5-10 second hold - Supine Hip Adduction Isometric with Ball  - 1 x daily - 7 x weekly - 10 reps - 5-10 second hold - Prone Knee Flexion (Mirrored)  - 3 x daily - 7 x weekly - 3 sets - 10 reps - Abdominal Bracing  - 3 x daily - 7 x weekly - 10 reps - 5-10 second hold - Seated Long Arc Quad  - 3 x daily - 7 x weekly - 2 sets - 10 reps - 5-10 second hold   Access Code: [email or web address] URL: [email or web address] Date: 07/07/2024 Prepared by: Prentice Treniece Holsclaw  Exercises - Supine Bridge  - 1 x daily - 7 x weekly - 3 sets - 10 reps - Clamshell (Mirrored)  - 1 x daily - 7 x  weekly - 3 sets - 10 reps  Access Code: [email or web address] URL: [email or web address] Date: 09/27/2024 Prepared by: Frankie Ziemba  Exercises - Stool Scoots  - 1 x daily - 1-3 x weekly - Noodle press  - 1 x daily - 1-3 x weekly - 1-3 sets - 10 reps - Noodle Stomp  - 1 x daily - 1-3 x weekly - 1-2 sets - 10 reps - Standing Balance on Noodle at [employer]  - 1 x daily - 1-3 x weekly - 1-3 reps - 20 hold - Squat on Noodle  - 1 x daily - 1-3 x weekly - 1-3 sets - 10 reps - Plank on Long Hand Float with Leg Lift  - 1 x daily - 1-3 x weekly -  1-2 sets - 10 reps - Flutter Kicking/Windshield Wipers  - 1 x daily - 1-3 x weekly - 3 sets - 10 reps - Runner's Step Up/Down  - 1 x daily - 1-3 x weekly - 1-3 sets - 10 reps - arm swing resisted with hand bells  - 1 x daily - 1-3 x weekly - 3 sets - 5-10 reps ASSESSMENT:  CLINICAL IMPRESSION: Began session on bike for dynamic warm up. Continued with functional strengthening and progressed all exercises with increased step height which is still performed well. He continues to progress very well. Patient will continue to benefit from physical therapy in order to improve function and reduce impairment.   OBJECTIVE IMPAIRMENTS: Abnormal gait, decreased activity tolerance, decreased balance, decreased endurance, decreased mobility, difficulty walking, decreased ROM, decreased strength, increased muscle spasms, impaired flexibility, improper body mechanics, and pain  ACTIVITY LIMITATIONS: lifting, bending, standing, squatting, stairs, transfers, locomotion level, and caring for others  PARTICIPATION LIMITATIONS: meal prep, cleaning, laundry, shopping, community activity, occupation, and yard work  PERSONAL FACTORS: Fitness and 3+ comorbidities: Hx R THA, R TKA, hx LBP, HTN are also affecting patient's functional outcome.   REHAB POTENTIAL: Good  CLINICAL DECISION MAKING: Evolving/moderate complexity  EVALUATION COMPLEXITY: Moderate   GOALS: Goals  reviewed with patient? Yes  SHORT TERM GOALS: Target date: 08/04/2024    Patient will be independent with HEP in order to improve functional outcomes. Baseline: Goal status: Met 08/03/23  2.  Patient will report at least 25% improvement in symptoms/functional status for improved quality of life. Baseline: Goal status: Met 09/02/24   LONG TERM GOALS: Target date: 09/01/2024  Patient will report at least 75% improvement in symptoms for improved quality of life. Baseline:  Goal status: Progressing 09/02/24 (40%) 12/06/24 50%  2.  Patient will improve LEFS  score by at least 15 points in order to indicate improved tolerance to activity. Baseline:  Goal status: Progressing 09/02/24; progressing 10/04/24  3.  Patient will be able to navigate stairs with reciprocal pattern without compensation in order to demonstrate improved LE strength. Baseline:  Goal status: Progressing 09/02/24  4. Patient will demonstrate grade of 5/5 MMT grade in all tested musculature as evidence of improved strength to assist with stair ambulation and gait. Baseline:  Goal status: Progressing 09/02/24; Partially met (LLE)/ progressing R 10/04/24  5.  Patient will be able to complete 5x STS in under 15 seconds in order to reduce the risk of falls. Baseline:  Goal status: Progressing 09/02/24  6.   SLS 30s without compensation for improved gait Baseline:  Goal status: New PLAN:  PT FREQUENCY: 1-2x/week  PT DURATION: 12 weeks  PLANNED INTERVENTIONS: [id number]- PT Re-evaluation, 97110-Therapeutic exercises, 97530- Therapeutic activity, [id number]- Neuromuscular re-education, 97535- Self Care, [id number]- Manual therapy, [id number]- Gait training, [phone]- Orthotic Fit/training, [phone]- Canalith repositioning, [id number]- Aquatic Therapy, [phone]- Splinting, [phone]- Wound care (first 20 sq cm), 97598- Wound care (each additional 20 sq cm)Patient/Family education, Balance training, Stair training, Taping, Dry Needling, Joint mobilization,  Joint manipulation, Spinal manipulation, Spinal mobilization, Scar mobilization, and DME instructions.  PLAN FOR NEXT SESSION: progress with hip gluteus medius tear repair   Prentice GORMAN Stains, PT, DPT 12/13/2024, 10:17 AM     "

## 2024-12-14 ENCOUNTER — Encounter (HOSPITAL_BASED_OUTPATIENT_CLINIC_OR_DEPARTMENT_OTHER): Payer: Self-pay | Admitting: Orthopaedic Surgery

## 2024-12-15 ENCOUNTER — Other Ambulatory Visit (HOSPITAL_BASED_OUTPATIENT_CLINIC_OR_DEPARTMENT_OTHER): Payer: Self-pay | Admitting: Orthopaedic Surgery

## 2024-12-15 ENCOUNTER — Ambulatory Visit (HOSPITAL_BASED_OUTPATIENT_CLINIC_OR_DEPARTMENT_OTHER): Admitting: Physical Therapy

## 2024-12-15 ENCOUNTER — Other Ambulatory Visit (HOSPITAL_BASED_OUTPATIENT_CLINIC_OR_DEPARTMENT_OTHER): Payer: Self-pay

## 2024-12-15 ENCOUNTER — Encounter (HOSPITAL_BASED_OUTPATIENT_CLINIC_OR_DEPARTMENT_OTHER): Payer: Self-pay | Admitting: Physical Therapy

## 2024-12-15 DIAGNOSIS — R29898 Other symptoms and signs involving the musculoskeletal system: Secondary | ICD-10-CM

## 2024-12-15 DIAGNOSIS — M6281 Muscle weakness (generalized): Secondary | ICD-10-CM

## 2024-12-15 DIAGNOSIS — M25551 Pain in right hip: Secondary | ICD-10-CM

## 2024-12-15 DIAGNOSIS — R2689 Other abnormalities of gait and mobility: Secondary | ICD-10-CM

## 2024-12-15 MED ORDER — LORAZEPAM 1 MG PO TABS
1.0000 mg | ORAL_TABLET | Freq: Three times a day (TID) | ORAL | 0 refills | Status: AC
  Filled 2024-12-15 (×2): qty 2, 1d supply, fill #0

## 2024-12-15 NOTE — Therapy (Signed)
 " OUTPATIENT PHYSICAL THERAPY TREATMENT    Patient Name: Reginald [name] MRN: [id number] DOB:04-15-1966, 59 y.o., male Today's Date: 12/15/2024    END OF SESSION:  PT End of Session - 12/15/24 0934     Visit Number 37    Number of Visits 56    Date for Recertification  01/20/25    Authorization Type [employer]    Progress Note Due on Visit 44    PT Start Time 0930    PT Stop Time 1010    PT Time Calculation (min) 40 min    Activity Tolerance Patient tolerated treatment well    Behavior During Therapy [facility] for tasks assessed/performed          Past Medical History:  Diagnosis Date   Arthritis    Gallstones    Hypertension    Past Surgical History:  Procedure Laterality Date   CHOLECYSTECTOMY  12/03/2011   Procedure: LAPAROSCOPIC CHOLECYSTECTOMY WITH INTRAOPERATIVE CHOLANGIOGRAM;  Surgeon: Camellia CHRISTELLA Blush, MD;  Location: [facility] OR;  Service: General;  Laterality: N/A;  laparoscopic cholecystectomy with intraoperative cholangiogram   CHONDROPLASTY Right 09/19/2015   Procedure: CHONDROPLASTY;  Surgeon: Norleen Gavel, MD;  Location: [facility];  Service: Orthopedics;  Laterality: Right;   ENDOVENOUS ABLATION SAPHENOUS VEIN W/ LASER Right 01/13/2019   endovenous laser ablation right greater saphenous vein and stab phlebectomy 10-20 incisions right leg by Lonni Blade MD    IR ABLATE LIVER CRYOABLATION  04/03/2020   IR RADIOLOGIST EVAL & MGMT  03/28/2020   KNEE ARTHROSCOPY WITH LATERAL MENISECTOMY Right 09/19/2015   Procedure: KNEE ARTHROSCOPY WITH PARTIAL LATERAL MENISECTOMY;  Surgeon: Norleen Gavel, MD;  Location: [facility];  Service: Orthopedics;  Laterality: Right;   KNEE ARTHROSCOPY WITH MEDIAL MENISECTOMY Right 09/19/2015   Procedure: KNEE ARTHROSCOPY WITH PARTIAL MEDIAL MENISECTOMY;  Surgeon: Norleen Gavel, MD;  Location: [facility];  Service: Orthopedics;  Laterality: Right;   OPEN SURGICAL REPAIR OF GLUTEAL TENDON Right  10/24/2024   Procedure: REPAIR, TENDON, GLUTEUS MEDIUS, ENDOSCOPIC;  Surgeon: Genelle Standing, MD;  Location: [facility];  Service: Orthopedics;  Laterality: Right;  RIGHT HIP ENDOSCOPIC GLUTEUS MEDIUS REPAIR   ROTATOR CUFF REPAIR Left    with revision   spider bite     black widow or brown recluse   TOE FUSION Left    TOTAL HIP ARTHROPLASTY Right 06/15/2023   Procedure: TOTAL HIP ARTHROPLASTY ANTERIOR APPROACH;  Surgeon: Gavel Norleen, MD;  Location: [facility];  Service: Orthopedics;  Laterality: Right;   TOTAL KNEE ARTHROPLASTY Right 03/31/2018   Procedure: RIGHT TOTAL KNEE ARTHROPLASTY;  Surgeon: Gavel Norleen, MD;  Location: [facility];  Service: Orthopedics;  Laterality: Right;   TOTAL KNEE REVISION Right 11/04/2019   Procedure: RIGHT TOTAL KNEE REVISION;  Surgeon: Gavel Norleen, MD;  Location: [facility];  Service: Orthopedics;  Laterality: Right;   TRIGGER FINGER RELEASE Right    Patient Active Problem List   Diagnosis Date Noted   Tear of right gluteus medius tendon 10/24/2024   Primary osteoarthritis of right hip 06/14/2023   Meralgia paraesthetica, right 06/10/2023   Spondylosis without myelopathy or radiculopathy, lumbar region 06/10/2023   Painful total knee replacement, right 11/04/2019   Arthrofibrosis of knee joint, right 11/04/2019   S/P revision of total knee, right 11/04/2019   Primary osteoarthritis of right knee 03/31/2018   Hypertension 12/02/2011   Obesity (BMI 30-39.9) 12/02/2011    PCP: [facility]  REFERRING PROVIDER: Gavel Norleen, MD/  glute repair Genelle Standing, MD  REFERRING DIAG: S/P right hip trochanteric bursectomy and IT band release 10/28/24 - S76.011A (ICD-10-CM) - Tear of right gluteus medius tendon, initial encounter  PROCEDURE: 1. Right hip gluteus medius tear repair 2. Right hip trochanteric bursectomy DOS 10/24/24  THERAPY DIAG:  Pain in right hip  Other symptoms and signs involving the musculoskeletal system  Other  abnormalities of gait and mobility  Muscle weakness (generalized)  Rationale for Evaluation and Treatment: Rehabilitation  ONSET DATE: 3 weeks ago; Glute repair 10/24/24  SUBJECTIVE:   SUBJECTIVE STATEMENT: Pt reports that he has a MRI scheduled for 12/19/24 for shoulder (Lt).  Pt reports that he has been doing self massage to Rt hip. He states that the manual therapy during session has helped; It's been working pretty good.  It's night and day compared to when I started (after surgery)   PERTINENT HISTORY: Hx R THA, R TKA, hx LBP, HTN,   PAIN:  Are you having pain? Yes: NPRS scale: 4/10 Pain location: R hip  Pain description: occasionally sharp, ache Aggravating factors: not sure  Relieving factors: nothing/time and rest   PRECAUTIONS: None  WEIGHT BEARING RESTRICTIONS: No  FALLS:  Has patient fallen in last 6 months? No  PLOF: Independent  PATIENT GOALS:  a little bit of relief  OBJECTIVE: (objective measures from initial evaluation unless otherwise dated) OBSERVATION:  10/28/24: sutures intact, no s/s of infection   MRI 8/25 right hip  IMPRESSION: 1. Right total hip arthroplasty with susceptibility artifact partially obscuring the adjacent soft tissue and osseous structures. No periarticular fluid collection or osteolysis. 2. Large amount of fluid in the right greater trochanteric bursa consistent with bursitis. 3. Mild osteoarthritis of the left hip.  PATIENT SURVEYS:  LEFS  Extreme difficulty/unable (0), Quite a bit of difficulty (1), Moderate difficulty (2), Little difficulty (3), No difficulty (4) Survey date:  07/07/24 09/02/24 10/04/24 10/28/24 12/06/24  Any of your usual work, housework or school activities 1 1  2 2   2. Usual hobbies, recreational or sporting activities 1 2  1 2   3. Getting into/out of the bath 1 1  1 1   4. Walking between rooms 2 2  2 2   5. Putting on socks/shoes 1 1  1 1   6. Squatting  2 1  1 1   7. Lifting an object, like a bag of  groceries from the floor 2 2  1 2   8. Performing light activities around your home 2 2  1 2   9. Performing heavy activities around your home 0 0  0 1  10. Getting into/out of a car 2 1  1 2   11. Walking 2 blocks 0 1  1 2   12. Walking 1 mile 0 0  0 0  13. Going up/down 10 stairs (1 flight) 1 1  1 1   14. Standing for 1 hour 1 1  1 1   15.  sitting for 1 hour 2 2  2 2   16. Running on even ground 0 0  0 0  17. Running on uneven ground 0 0  0 0  18. Making sharp turns while running fast 0 0  0 0  19. Hopping  0 0  0 0  20. Rolling over in bed 1 1  1 3   Score total:  19/80 19/80 23/80  17/80 25/80     COGNITION: Overall cognitive status: Within functional limits for tasks assessed     SENSATION: WFL  POSTURE: No Significant postural  limitations  PALPATION: TTP R greater Troch   LOWER EXTREMITY ROM: 10/28/24: WFL for tasks assessed  Active ROM Right eval Left eval Right 10/28/24 Left 10/28/24  Hip flexion   90   Hip extension      Hip abduction      Hip adduction      Hip internal rotation      Hip external rotation      Knee flexion      Knee extension      Ankle dorsiflexion      Ankle plantarflexion      Ankle inversion      Ankle eversion       (Blank rows = not tested) *= pain/symptoms  LOWER EXTREMITY MMT: 10/28/24: not fully assessed due to post op status  MMT Right eval Left eval Right 09/02/24 Left 09/02/24 Right  10/04/24 Right 10/28/24 Left 10/28/24 Right  12/15/24 Left 12/15/24  Hip flexion 4+ 5 4+ 5 4+ -5- 4+ 4+ 5 5  Hip extension 4 4+ 4+ 5 4+ -5-      Hip abduction (sidelying) 4-  4+ 5 4+      Hip adduction           Hip internal rotation           Hip external rotation           Knee flexion 5 5 5 5  5 5 5 5   Knee extension 5 5 5 5  5 5 5 5   Ankle dorsiflexion           Ankle plantarflexion           Ankle inversion           Ankle eversion            (Blank rows = not tested) *= pain/symptoms  FUNCTIONAL TESTS:  5 times sit to stand:  26.97 seconds  Stairs: 7 inch, step too pattern, uses LLE 5xSTS 09/02/24: 17.38 without use of Ues  10/28/24: not fully assessed due to post op status  12/15/24:  5x STS  -11.61 sec  12/15/24: SLS RLE: 16.76 sec, LLE 8.66 sec/11.01 sec    GAIT: Distance walked: 100 feet  Assistive device utilized: None Level of assistance: Complete Independence Comments: antalgic on RLE with truncal lean to R  GAIT: 10/28/24 Distance walked: 100 feet  Assistive device utilized: None Level of assistance: Complete Independence Comments: antalgic on RLE with truncal lean to R  TODAY'S TREATMENT:     12/15/24: -Upright bike, L8 x 7 min  -MMT -5x STS and SLS test - Dead lift 20# 2 x 10 - backward lunge into split squat x 10 each LE (UE on high table) - R/L quad stretch with foot on chair x 30 sec x 2 - SL mini squat slide out 4 way x10 each LE (cues for hip hinge) - repeated quad stretch with foot on chair  12/13/24 Bike 5 minutes  Manual: STM TFL, adductors, glutes, and hip flexor Step up 8 inch 3 x 10 Lateral step up 8 inch 3 x 10 Lateral step down 6 inch 3 x 10 Dead lift 20# 3 x 10   12-22-2024 Bike 5 minutes  Manual: STM TFL, adductors, glutes, and hip flexor Hip rotation on stool 3 x 10 Step up 6 inch 3 x 10 Lateral step up 6 inch 3 x 10 Lateral step down 4 inch 2 x 10 Dead lift 10# 2 x 10  Dec 20, 2024 Bike  5 minutes  Manual: STM TFL, adductors, glutes, and hip flexor Standing hip abduction 3 x 10 Standing hip extension 3 x 10 Step up 4 inch 2 x 10 Lateral step up 4 inch 2 x 10 Lateral stepping 8 x 12 feet Hip rotation on stool 3 x 10   11/29/24 Bike 5 minutes  Manual: STM TFL, adductors, glutes, and hip flexor Supine clam GTB 3 x 10 Prone hip extension 3# 3 x 10 Standing hip abduction 2 x 10 Standing hip extension 2 x 10 Mini squat 2 x 10 Leg press 130# 1 x 10 (RPE 3/10), 150# 2 x 10 (RPE 5-6/10) SLS with intermittent UE support 3 x 20-30 second holds Chair squat 2 x  10   11/24/24 Bike 5 minutes  Manual: STM TFL, adductors, glutes, and hip flexor; lateral distraction with belt Supine hip abduction isometrics with belt 2 x 10 with 5-10 second holds Prone hip extension 2 x 10 Prone hip extension with knee bent 2 x 10 STS from elevated table 2 x 10 SLS with UE support 3 x 20-30 second holds Standing hip adductor stretch with OH reach 5 x 10 second holds  11/22/24 Bike 5 minutes  Manual: STM TFL, adductors, and hip flexor; lateral distraction with belt Supine hip adductor stretch 5 x 10 second holds Supine hip abduction isometrics with belt 2 x 10 with 5-10 second holds Supine gentle hip flexor isometric 10 x 5-10 second holds Bridge 10# 3 x 10 Prone hip extension 2 x 10   11/08/24 STM TFL and hip flexor Education on gait mechanics Bike 5 minutes  Hooklying glute set 5 x 5 second holds, with small lift 2 x 10 Prone glute sets 2 x 10 with 5-10 second holds Quadruped rocking for hip flexion 2 x 10 Standing weight shifts with glute set 2 x 10 Standing glute set with LLE on step and overhead lift 5# 2 x 10   11/04/24 Nustep level 5, 5 minutes for dynamic warm up Hip adduction isometric 10 x 5-10 second holds TA activation with small bent knee fall out 2 x 10 SAQ 2# 2 x 10 with 5 second holds Prone glute sets 2 x 10 with 5-10 second holds Prone hamstring curl 5# 2 x 10 LAQ 5# 2 x 10     PATIENT EDUCATION:  Education details: exercise progression/ modification, glute med repair protocol Person educated: Patient Education method: Explanation, Demonstration,  Education comprehension: verbalized understanding, returned demonstration, verbal cues required, and tactile cues required  HOME EXERCISE PROGRAM: POST OP GLUTE Access Code: [email or web address] URL: [email or web address] Date: 10/28/2024 Prepared by: Prentice Zaunegger  Exercises - Hooklying Gluteal Sets  - 3 x daily - 7 x weekly - 10 reps - 5-10 second hold - Supine Hip  Adduction Isometric with Ball  - 1 x daily - 7 x weekly - 10 reps - 5-10 second hold - Prone Knee Flexion (Mirrored)  - 3 x daily - 7 x weekly - 3 sets - 10 reps - Abdominal Bracing  - 3 x daily - 7 x weekly - 10 reps - 5-10 second hold - Seated Long Arc Quad  - 3 x daily - 7 x weekly - 2 sets - 10 reps - 5-10 second hold  Aquatic Access Code: [email or web address] URL: [email or web address] Date: 09/27/2024 Prepared by: Matilda Kohut  ASSESSMENT:  CLINICAL IMPRESSION: Pt is 7 wks s/p R glute repair. Pt tolerated all exercises well, but reported some increase in Rt knee pain with SLS mini  squat with opp LE slides. He required minor cues on form and posture with exercises.  Much improved 5x STS this date.  Would benefit from more work in SLS for improved balance.  Pt has met LTG 4 and 5. Patient will continue to benefit from physical therapy in order to improve function and reduce impairment.  Therapist to check glute strength next session, if appropriate and if time allows.    OBJECTIVE IMPAIRMENTS: Abnormal gait, decreased activity tolerance, decreased balance, decreased endurance, decreased mobility, difficulty walking, decreased ROM, decreased strength, increased muscle spasms, impaired flexibility, improper body mechanics, and pain  ACTIVITY LIMITATIONS: lifting, bending, standing, squatting, stairs, transfers, locomotion level, and caring for others  PARTICIPATION LIMITATIONS: meal prep, cleaning, laundry, shopping, community activity, occupation, and yard work  PERSONAL FACTORS: Fitness and 3+ comorbidities: Hx R THA, R TKA, hx LBP, HTN are also affecting patient's functional outcome.   REHAB POTENTIAL: Good  CLINICAL DECISION MAKING: Evolving/moderate complexity  EVALUATION COMPLEXITY: Moderate   GOALS: Goals reviewed with patient? Yes  SHORT TERM GOALS: Target date: 08/04/2024    Patient will be independent with HEP in order to improve functional  outcomes. Baseline: Goal status: Met 08/03/23  2.  Patient will report at least 25% improvement in symptoms/functional status for improved quality of life. Baseline: Goal status: Met 09/02/24   LONG TERM GOALS: Target date: POC  Patient will report at least 75% improvement in symptoms for improved quality of life. Baseline:  Goal status: Progressing 09/02/24 (40%) 12/06/24 50%  2.  Patient will improve LEFS  score by at least 15 points in order to indicate improved tolerance to activity. Baseline:  Goal status: Progressing 09/02/24; progressing 10/04/24  3.  Patient will be able to navigate stairs with reciprocal pattern without compensation in order to demonstrate improved LE strength. Baseline: Not completing due to Lt knee pain Goal status: Ongoing - 12/15/24  4. Patient will demonstrate grade of 5/5 MMT grade in all tested musculature as evidence of improved strength to assist with stair ambulation and gait. Baseline:  Goal status: MET 12/15/24  5.  Patient will be able to complete 5x STS in under 15 seconds in order to reduce the risk of falls. Baseline:  Goal status: MET 12/15/24  6.   SLS 30s without compensation for improved gait Baseline:  Goal status: Progressing 12/15/24 PLAN:  PT FREQUENCY: 1-2x/week  PT DURATION: 12 weeks  PLANNED INTERVENTIONS: 97164- PT Re-evaluation, 97110-Therapeutic exercises, 97530- Therapeutic activity, 97112- Neuromuscular re-education, 97535- Self Care, [id number]- Manual therapy, [id number]- Gait training, [phone]- Orthotic Fit/training, [phone]- Canalith repositioning, [id number]- Aquatic Therapy, [phone]- Splinting, [phone]- Wound care (first 20 sq cm), 97598- Wound care (each additional 20 sq cm)Patient/Family education, Balance training, Stair training, Taping, Dry Needling, Joint mobilization, Joint manipulation, Spinal manipulation, Spinal mobilization, Scar mobilization, and DME instructions.  PLAN FOR NEXT SESSION: progress with hip gluteus medius tear  repair   Delon Aquas, PTA 12/15/24 10:25 AM [facility] [address] [place], [place], [address] Phone: [phone]   Fax:  [phone]  "

## 2024-12-19 ENCOUNTER — Other Ambulatory Visit

## 2024-12-20 ENCOUNTER — Ambulatory Visit (HOSPITAL_BASED_OUTPATIENT_CLINIC_OR_DEPARTMENT_OTHER): Attending: Orthopedic Surgery | Admitting: Physical Therapy

## 2024-12-22 ENCOUNTER — Encounter (HOSPITAL_BASED_OUTPATIENT_CLINIC_OR_DEPARTMENT_OTHER): Admitting: Physical Therapy

## 2024-12-27 ENCOUNTER — Encounter (HOSPITAL_BASED_OUTPATIENT_CLINIC_OR_DEPARTMENT_OTHER): Admitting: Physical Therapy

## 2024-12-29 ENCOUNTER — Encounter (HOSPITAL_BASED_OUTPATIENT_CLINIC_OR_DEPARTMENT_OTHER): Admitting: Physical Therapy

## 2025-01-01 ENCOUNTER — Other Ambulatory Visit

## 2025-01-02 ENCOUNTER — Ambulatory Visit: Admitting: Orthopaedic Surgery

## 2025-01-03 ENCOUNTER — Encounter (HOSPITAL_BASED_OUTPATIENT_CLINIC_OR_DEPARTMENT_OTHER): Admitting: Physical Therapy

## 2025-01-05 ENCOUNTER — Encounter (HOSPITAL_BASED_OUTPATIENT_CLINIC_OR_DEPARTMENT_OTHER): Admitting: Physical Therapy

## 2025-01-10 ENCOUNTER — Encounter (HOSPITAL_BASED_OUTPATIENT_CLINIC_OR_DEPARTMENT_OTHER): Admitting: Physical Therapy

## 2025-01-11 ENCOUNTER — Encounter (HOSPITAL_BASED_OUTPATIENT_CLINIC_OR_DEPARTMENT_OTHER): Admitting: Orthopaedic Surgery

## 2025-01-12 ENCOUNTER — Encounter (HOSPITAL_BASED_OUTPATIENT_CLINIC_OR_DEPARTMENT_OTHER): Admitting: Orthopaedic Surgery

## 2025-01-12 ENCOUNTER — Encounter (HOSPITAL_BASED_OUTPATIENT_CLINIC_OR_DEPARTMENT_OTHER): Admitting: Physical Therapy

## 2025-01-17 ENCOUNTER — Encounter (HOSPITAL_BASED_OUTPATIENT_CLINIC_OR_DEPARTMENT_OTHER): Admitting: Physical Therapy

## 2025-01-19 ENCOUNTER — Encounter (HOSPITAL_BASED_OUTPATIENT_CLINIC_OR_DEPARTMENT_OTHER): Admitting: Physical Therapy
